# Patient Record
Sex: Female | Born: 1950
Health system: Southern US, Community
[De-identification: ages and names within clinical notes are randomized; demographics above are authoritative.]

## PROBLEM LIST (undated history)

## (undated) DIAGNOSIS — F329 Major depressive disorder, single episode, unspecified: Secondary | ICD-10-CM

## (undated) DIAGNOSIS — E039 Hypothyroidism, unspecified: Secondary | ICD-10-CM

## (undated) DIAGNOSIS — I219 Acute myocardial infarction, unspecified: Secondary | ICD-10-CM

## (undated) DIAGNOSIS — K209 Esophagitis, unspecified without bleeding: Secondary | ICD-10-CM

## (undated) DIAGNOSIS — K279 Peptic ulcer, site unspecified, unspecified as acute or chronic, without hemorrhage or perforation: Secondary | ICD-10-CM

## (undated) DIAGNOSIS — Z86718 Personal history of other venous thrombosis and embolism: Secondary | ICD-10-CM

## (undated) DIAGNOSIS — F39 Unspecified mood [affective] disorder: Secondary | ICD-10-CM

## (undated) DIAGNOSIS — E0500 Thyrotoxicosis with diffuse goiter without thyrotoxic crisis or storm: Secondary | ICD-10-CM

## (undated) DIAGNOSIS — I872 Venous insufficiency (chronic) (peripheral): Secondary | ICD-10-CM

## (undated) DIAGNOSIS — K219 Gastro-esophageal reflux disease without esophagitis: Secondary | ICD-10-CM

## (undated) DIAGNOSIS — E559 Vitamin D deficiency, unspecified: Secondary | ICD-10-CM

## (undated) DIAGNOSIS — H269 Unspecified cataract: Secondary | ICD-10-CM

## (undated) DIAGNOSIS — G5600 Carpal tunnel syndrome, unspecified upper limb: Secondary | ICD-10-CM

## (undated) DIAGNOSIS — E079 Disorder of thyroid, unspecified: Secondary | ICD-10-CM

## (undated) DIAGNOSIS — F32A Depression, unspecified: Secondary | ICD-10-CM

## (undated) DIAGNOSIS — R7303 Prediabetes: Secondary | ICD-10-CM

## (undated) DIAGNOSIS — M1990 Unspecified osteoarthritis, unspecified site: Secondary | ICD-10-CM

## (undated) DIAGNOSIS — I10 Essential (primary) hypertension: Secondary | ICD-10-CM

## (undated) DIAGNOSIS — I639 Cerebral infarction, unspecified: Secondary | ICD-10-CM

## (undated) HISTORY — DX: Venous insufficiency (chronic) (peripheral): I87.2

## (undated) HISTORY — DX: Vitamin D deficiency, unspecified: E55.9

## (undated) HISTORY — DX: Major depressive disorder, single episode, unspecified: F32.9

## (undated) HISTORY — DX: Esophagitis, unspecified: K20.9

## (undated) HISTORY — DX: Esophagitis, unspecified without bleeding: K20.90

## (undated) HISTORY — DX: Unspecified cataract: H26.9

## (undated) HISTORY — DX: Unspecified mood (affective) disorder: F39

## (undated) HISTORY — DX: Peptic ulcer, site unspecified, unspecified as acute or chronic, without hemorrhage or perforation: K27.9

## (undated) HISTORY — DX: Depression, unspecified: F32.A

## (undated) HISTORY — DX: Personal history of other venous thrombosis and embolism: Z86.718

## (undated) HISTORY — DX: Thyrotoxicosis with diffuse goiter without thyrotoxic crisis or storm: E05.00

## (undated) HISTORY — DX: Hypothyroidism, unspecified: E03.9

## (undated) HISTORY — PX: TUBAL LIGATION: SHX77

## (undated) HISTORY — DX: Gastro-esophageal reflux disease without esophagitis: K21.9

## (undated) HISTORY — DX: Carpal tunnel syndrome, unspecified upper limb: G56.00

---

## 2005-07-18 ENCOUNTER — Ambulatory Visit: Payer: Self-pay | Admitting: Endocrinology

## 2005-10-08 ENCOUNTER — Ambulatory Visit: Payer: Self-pay | Admitting: Endocrinology

## 2005-11-07 ENCOUNTER — Ambulatory Visit: Payer: Self-pay | Admitting: Endocrinology

## 2005-12-05 ENCOUNTER — Ambulatory Visit: Payer: Self-pay | Admitting: Endocrinology

## 2006-01-24 ENCOUNTER — Ambulatory Visit: Payer: Self-pay | Admitting: Endocrinology

## 2006-03-26 ENCOUNTER — Ambulatory Visit: Payer: Self-pay | Admitting: Endocrinology

## 2006-07-25 ENCOUNTER — Ambulatory Visit: Payer: Self-pay | Admitting: Endocrinology

## 2006-11-26 ENCOUNTER — Encounter: Payer: Self-pay | Admitting: Endocrinology

## 2006-11-26 DIAGNOSIS — F329 Major depressive disorder, single episode, unspecified: Secondary | ICD-10-CM | POA: Insufficient documentation

## 2006-11-26 DIAGNOSIS — F3289 Other specified depressive episodes: Secondary | ICD-10-CM | POA: Insufficient documentation

## 2006-11-26 DIAGNOSIS — I252 Old myocardial infarction: Secondary | ICD-10-CM | POA: Insufficient documentation

## 2006-11-26 DIAGNOSIS — F411 Generalized anxiety disorder: Secondary | ICD-10-CM | POA: Insufficient documentation

## 2006-11-26 DIAGNOSIS — I10 Essential (primary) hypertension: Secondary | ICD-10-CM | POA: Insufficient documentation

## 2006-11-26 DIAGNOSIS — J309 Allergic rhinitis, unspecified: Secondary | ICD-10-CM | POA: Insufficient documentation

## 2007-01-28 ENCOUNTER — Ambulatory Visit: Payer: Self-pay | Admitting: Endocrinology

## 2007-01-28 ENCOUNTER — Encounter: Payer: Self-pay | Admitting: Endocrinology

## 2007-03-12 ENCOUNTER — Ambulatory Visit: Payer: Self-pay | Admitting: Endocrinology

## 2007-03-12 LAB — CONVERTED CEMR LAB
Free T4: 0.8 ng/dL (ref 0.6–1.6)
TSH: 0.71 microintl units/mL (ref 0.35–5.50)

## 2007-06-08 ENCOUNTER — Ambulatory Visit: Payer: Self-pay | Admitting: Endocrinology

## 2007-06-08 LAB — CONVERTED CEMR LAB: TSH: 0.22 microintl units/mL — ABNORMAL LOW (ref 0.35–5.50)

## 2007-06-17 ENCOUNTER — Encounter: Admission: RE | Admit: 2007-06-17 | Discharge: 2007-06-17 | Payer: Self-pay | Admitting: Endocrinology

## 2007-07-01 ENCOUNTER — Encounter: Admission: RE | Admit: 2007-07-01 | Discharge: 2007-07-01 | Payer: Self-pay | Admitting: Endocrinology

## 2007-07-30 ENCOUNTER — Telehealth (INDEPENDENT_AMBULATORY_CARE_PROVIDER_SITE_OTHER): Payer: Self-pay | Admitting: *Deleted

## 2007-09-07 ENCOUNTER — Ambulatory Visit: Payer: Self-pay | Admitting: Endocrinology

## 2007-10-14 ENCOUNTER — Ambulatory Visit: Payer: Self-pay | Admitting: Endocrinology

## 2007-10-14 LAB — CONVERTED CEMR LAB: Free T4: 1.4 ng/dL (ref 0.6–1.6)

## 2007-12-02 ENCOUNTER — Ambulatory Visit: Payer: Self-pay | Admitting: Endocrinology

## 2007-12-02 LAB — CONVERTED CEMR LAB: TSH: 0.14 microintl units/mL — ABNORMAL LOW (ref 0.35–5.50)

## 2007-12-08 ENCOUNTER — Encounter: Admission: RE | Admit: 2007-12-08 | Discharge: 2007-12-08 | Payer: Self-pay | Admitting: Endocrinology

## 2007-12-25 ENCOUNTER — Encounter (HOSPITAL_COMMUNITY): Admission: RE | Admit: 2007-12-25 | Discharge: 2008-03-02 | Payer: Self-pay | Admitting: Endocrinology

## 2008-05-02 ENCOUNTER — Ambulatory Visit: Payer: Self-pay | Admitting: Endocrinology

## 2008-05-02 DIAGNOSIS — E039 Hypothyroidism, unspecified: Secondary | ICD-10-CM | POA: Insufficient documentation

## 2008-05-02 LAB — CONVERTED CEMR LAB: TSH: 40.77 microintl units/mL — ABNORMAL HIGH (ref 0.35–5.50)

## 2008-05-05 ENCOUNTER — Telehealth: Payer: Self-pay | Admitting: Endocrinology

## 2009-02-14 ENCOUNTER — Emergency Department (HOSPITAL_COMMUNITY): Admission: EM | Admit: 2009-02-14 | Discharge: 2009-02-14 | Payer: Self-pay | Admitting: Emergency Medicine

## 2010-08-02 LAB — CBC
Hemoglobin: 14.2 g/dL (ref 12.0–15.0)
MCHC: 32.9 g/dL (ref 30.0–36.0)
RDW: 15.7 % — ABNORMAL HIGH (ref 11.5–15.5)

## 2010-08-02 LAB — URINE MICROSCOPIC-ADD ON

## 2010-08-02 LAB — URINALYSIS, ROUTINE W REFLEX MICROSCOPIC
Bilirubin Urine: NEGATIVE
Glucose, UA: NEGATIVE mg/dL
Ketones, ur: NEGATIVE mg/dL
Nitrite: NEGATIVE

## 2010-08-02 LAB — POCT I-STAT, CHEM 8
BUN: 8 mg/dL (ref 6–23)
Calcium, Ion: 1.01 mmol/L — ABNORMAL LOW (ref 1.12–1.32)
Chloride: 104 mEq/L (ref 96–112)
Creatinine, Ser: 0.9 mg/dL (ref 0.4–1.2)
Glucose, Bld: 124 mg/dL — ABNORMAL HIGH (ref 70–99)
Hemoglobin: 16.3 g/dL — ABNORMAL HIGH (ref 12.0–15.0)
TCO2: 25 mmol/L (ref 0–100)

## 2010-08-02 LAB — PROTIME-INR
INR: 3.2 — ABNORMAL HIGH (ref 0.00–1.49)
Prothrombin Time: 32.5 seconds — ABNORMAL HIGH (ref 11.6–15.2)

## 2010-09-14 NOTE — Consult Note (Signed)
[facility]                            ENDOCRINOLOGY CONSULTATION   NAME:Julson, Oluwatoni                       MRN:          [id number]  DATE:12/05/2005                            DOB:          01/02/1951    REASON FOR VISIT:  Followup thyroid.   HISTORY OF PRESENT ILLNESS:  A 60 year old woman with hyperthyroidism who  now takes PTU 50 mg twice daily.  I have reduced it again at her last visit  because her TSH was again elevated.  She states she is feeling better in  general, except she has a few days of a sore throat.   PAST MEDICAL HISTORY:  Same as October 08, 2005.   REVIEW OF SYSTEMS:  She denies fever.  She has lost several more pounds  since her last visit.   PHYSICAL EXAMINATION:  VITAL SIGNS:  Blood pressure 137/78, heart rate 72,  temperature  98.3.  The weight is 260.  GENERAL:  No distress.  SKIN:  Normal texture and temperature.  HEENT:  No proptosis.  No periorbital swelling. Pharynx no erythema.  NECK:  The thyroid is minimally and diffusely enlarged.  NEUROLOGICAL:  Alert, well oriented.  Does not appear anxious nor depressed  and has no tremor.   LABORATORY STUDIES:  On December 05, 2005:  CBC is normal.  TSH is 8.2 which is  about half of the number she had at her last visit.   IMPRESSION:  1. Sore throat which in view of her normal CBC is not a side effect of the      PTU.  2. Hyperthyroidism.  She was over-controlled at her last visit, but her      TSH is now much closer to normal.   PLAN:  1. Continue PTU 50 mg twice a day.  2. Return in about a month.                                  Sean A. Everardo All, MD   SAE/MedQ  DD:  12/08/2005  DT:  12/09/2005  Job #:  [id number]   cc:   Raynelle Jan, MD

## 2010-09-14 NOTE — Consult Note (Signed)
[facility]                          ENDOCRINOLOGY CONSULTATION   NAME:Minniefield, Maddie                       MRN:          [id number]  DATE:03/26/2006                            DOB:          [birth year]    REASON FOR VISIT:  Followup thyroid.   HISTORY OF PRESENT ILLNESS:  A 60 year old woman who currently takes PTU  50 mg twice daily and states she is feeling well in general.   PAST MEDICAL HISTORY:  Same as January 25, 2006.   REVIEW OF SYSTEMS:  Denies any change in her weight.   PHYSICAL EXAMINATION:  VITAL SIGNS:  Blood pressure 125/80, heart rate  65, temperature 97.6, the weight is 253.  GENERAL:  No distress. She is overweight.  NECK:  The thyroid is normal.  SKIN:  Normal texture and temperature.  NEUROLOGIC:  Alert, well-oriented, does not appear anxious nor depressed  and there is no tremor.   LABORATORY DATA:  On March 26, 2006, TSH 1.42.   IMPRESSION:  Well-controlled hyperthyroidism.   PLAN:  1. Same amount of PTU.  2. Return in four months.     Sean A. Everardo All, MD  Electronically Signed    SAE/MedQ  DD: 03/30/2006  DT: 03/31/2006  Job #: [id number]   cc:   Raynelle Jan, M.D.

## 2010-09-14 NOTE — Consult Note (Signed)
[facility]                            ENDOCRINOLOGY CONSULTATION   NAME:[name], [name]                       MRN:          [id number]  DATE:01/25/2006                            DOB:          1950-11-29    REASON FOR CONSULTATION:  Follow-up thyroid.   HISTORY OF PRESENT ILLNESS:  The patient is currently taking PTU 50 mg twice  a day and states she feels no different and well in general.   PAST MEDICAL HISTORY:  1. Hypertension.  2. Depression/anxiety.  3. Allergic rhinitis.  4. GERD.  5. Myocardial infarction 04/2005.  6. Dyslipidemia.   REVIEW OF SYSTEMS:  She denies fevers, she denies any change in weight.   PHYSICAL EXAMINATION:  VITALS:  Blood pressure is 125/74, heart rate 62,  temperature is 97.4, the weight is 261.  GENERAL:  No distress.  The thyroid is minimally and diffusely enlarged.  SKIN:  Normal texture and temperature.  NEUROLOGIC:  Alert and oriented, does not appear anxious nor depressed.   LABORATORY DATA:  On 01/24/06 TSH is normal at 3.4.   IMPRESSION:  Well controlled hyperthyroidism.   PLAN:  1. Same amount of PTU.  2. Return in 2 months.            ______________________________  Cleophas Dunker. Everardo All, MD    SAE/MedQ  DD:  01/26/2006  DT:  01/27/2006  Job #:  [id number]   cc:   Raynelle Jan, M.D.

## 2010-09-14 NOTE — Consult Note (Signed)
[facility]                          ENDOCRINOLOGY CONSULTATION   NAME:[name], [name]                       MRN:          [id number]  DATE:07/25/2006                            DOB:          [birth year]    REASON FOR VISIT:  Follow up thyroid.   HISTORY OF PRESENT ILLNESS:  A 60 year old woman who takes PTU 100 mg  twice a day rather than the 50 mg twice a day as prescribed.  She states  she feels well in general.   PAST MEDICAL HISTORY:  1. Dyslipidemia.  2. Myocardial infarction.  3. GERD.  4. Allergic rhinitis.  5. Depression/anxiety.  6. Hypertension.   REVIEW OF SYSTEMS:  Denies fever.   PHYSICAL EXAMINATION:  VITAL SIGNS:  Blood pressure 119/72, heart rate  67, temperature 97.8, weight is 266.  GENERAL:  In no distress.  NECK:  Thyroid normal to my examination.  SKIN:  Not diaphoretic.  NEUROLOGIC:  No tremor.   LABORATORY STUDIES:  On July 25, 2006, TSH of 4.42.   IMPRESSION:  Although the TSH was normal, it will most likely go above  normal if this dosage of PTU is continued.   PLAN:  1. I left the patient a message advising her to reduce the PTU back to      50 mg twice a day.  2. Return in 6 months.     Sean A. Everardo All, MD  Electronically Signed    SAE/MedQ  DD: 07/28/2006  DT: 07/29/2006  Job #: [id number]   cc:   Raynelle Jan, M.D.

## 2014-06-19 ENCOUNTER — Emergency Department (HOSPITAL_COMMUNITY): Payer: Medicare Other

## 2014-06-19 ENCOUNTER — Inpatient Hospital Stay (HOSPITAL_COMMUNITY)
Admission: EM | Admit: 2014-06-19 | Discharge: 2014-06-21 | DRG: 193 | Disposition: A | Discharge status: Home / Self Care | Payer: Medicare Other | Attending: Internal Medicine | Admitting: Internal Medicine

## 2014-06-19 ENCOUNTER — Encounter (HOSPITAL_COMMUNITY): Payer: Self-pay | Admitting: *Deleted

## 2014-06-19 DIAGNOSIS — J188 Other pneumonia, unspecified organism: Secondary | ICD-10-CM | POA: Diagnosis not present

## 2014-06-19 DIAGNOSIS — J189 Pneumonia, unspecified organism: Secondary | ICD-10-CM | POA: Diagnosis present

## 2014-06-19 DIAGNOSIS — E871 Hypo-osmolality and hyponatremia: Secondary | ICD-10-CM | POA: Diagnosis not present

## 2014-06-19 DIAGNOSIS — R0602 Shortness of breath: Secondary | ICD-10-CM | POA: Diagnosis not present

## 2014-06-19 DIAGNOSIS — I2510 Atherosclerotic heart disease of native coronary artery without angina pectoris: Secondary | ICD-10-CM | POA: Diagnosis present

## 2014-06-19 DIAGNOSIS — J09X1 Influenza due to identified novel influenza A virus with pneumonia: Secondary | ICD-10-CM | POA: Diagnosis not present

## 2014-06-19 DIAGNOSIS — Z9981 Dependence on supplemental oxygen: Secondary | ICD-10-CM | POA: Diagnosis not present

## 2014-06-19 DIAGNOSIS — I252 Old myocardial infarction: Secondary | ICD-10-CM

## 2014-06-19 DIAGNOSIS — Z8673 Personal history of transient ischemic attack (TIA), and cerebral infarction without residual deficits: Secondary | ICD-10-CM | POA: Diagnosis not present

## 2014-06-19 DIAGNOSIS — Z79899 Other long term (current) drug therapy: Secondary | ICD-10-CM | POA: Diagnosis not present

## 2014-06-19 DIAGNOSIS — J9601 Acute respiratory failure with hypoxia: Secondary | ICD-10-CM | POA: Diagnosis not present

## 2014-06-19 DIAGNOSIS — J209 Acute bronchitis, unspecified: Secondary | ICD-10-CM | POA: Diagnosis not present

## 2014-06-19 DIAGNOSIS — J9811 Atelectasis: Secondary | ICD-10-CM | POA: Diagnosis present

## 2014-06-19 DIAGNOSIS — Z882 Allergy status to sulfonamides status: Secondary | ICD-10-CM | POA: Diagnosis not present

## 2014-06-19 DIAGNOSIS — E039 Hypothyroidism, unspecified: Secondary | ICD-10-CM | POA: Diagnosis not present

## 2014-06-19 DIAGNOSIS — Z9911 Dependence on respirator [ventilator] status: Secondary | ICD-10-CM | POA: Diagnosis not present

## 2014-06-19 DIAGNOSIS — E876 Hypokalemia: Secondary | ICD-10-CM | POA: Diagnosis present

## 2014-06-19 DIAGNOSIS — R6510 Systemic inflammatory response syndrome (SIRS) of non-infectious origin without acute organ dysfunction: Secondary | ICD-10-CM | POA: Diagnosis present

## 2014-06-19 DIAGNOSIS — D6959 Other secondary thrombocytopenia: Secondary | ICD-10-CM | POA: Diagnosis present

## 2014-06-19 DIAGNOSIS — N179 Acute kidney failure, unspecified: Secondary | ICD-10-CM | POA: Diagnosis not present

## 2014-06-19 DIAGNOSIS — J9600 Acute respiratory failure, unspecified whether with hypoxia or hypercapnia: Secondary | ICD-10-CM

## 2014-06-19 DIAGNOSIS — R509 Fever, unspecified: Secondary | ICD-10-CM | POA: Diagnosis not present

## 2014-06-19 DIAGNOSIS — R05 Cough: Secondary | ICD-10-CM | POA: Diagnosis not present

## 2014-06-19 DIAGNOSIS — I10 Essential (primary) hypertension: Secondary | ICD-10-CM | POA: Diagnosis not present

## 2014-06-19 HISTORY — DX: Disorder of thyroid, unspecified: E07.9

## 2014-06-19 HISTORY — DX: Acute myocardial infarction, unspecified: I21.9

## 2014-06-19 HISTORY — DX: Essential (primary) hypertension: I10

## 2014-06-19 HISTORY — DX: Cerebral infarction, unspecified: I63.9

## 2014-06-19 LAB — CBC WITH DIFFERENTIAL/PLATELET
BASOS ABS: 0 10*3/uL (ref 0.0–0.1)
BASOS PCT: 0 % (ref 0–1)
Eosinophils Absolute: 0 10*3/uL (ref 0.0–0.7)
Eosinophils Relative: 0 % (ref 0–5)
HCT: 35.1 % — ABNORMAL LOW (ref 36.0–46.0)
Hemoglobin: 11.4 g/dL — ABNORMAL LOW (ref 12.0–15.0)
Lymphocytes Relative: 9 % — ABNORMAL LOW (ref 12–46)
Lymphs Abs: 0.6 10*3/uL — ABNORMAL LOW (ref 0.7–4.0)
MCH: 25.9 pg — AB (ref 26.0–34.0)
MCHC: 32.5 g/dL (ref 30.0–36.0)
MCV: 79.8 fL (ref 78.0–100.0)
Monocytes Absolute: 0.6 10*3/uL (ref 0.1–1.0)
Monocytes Relative: 9 % (ref 3–12)
NEUTROS ABS: 5 10*3/uL (ref 1.7–7.7)
Neutrophils Relative %: 82 % — ABNORMAL HIGH (ref 43–77)
PLATELETS: 136 10*3/uL — AB (ref 150–400)
RBC: 4.4 MIL/uL (ref 3.87–5.11)
RDW: 15.6 % — AB (ref 11.5–15.5)
WBC: 6.2 10*3/uL (ref 4.0–10.5)

## 2014-06-19 LAB — COMPREHENSIVE METABOLIC PANEL
ALK PHOS: 113 U/L (ref 39–117)
ALT: 27 U/L (ref 0–35)
ANION GAP: 12 (ref 5–15)
AST: 32 U/L (ref 0–37)
Albumin: 3.2 g/dL — ABNORMAL LOW (ref 3.5–5.2)
BILIRUBIN TOTAL: 0.9 mg/dL (ref 0.3–1.2)
BUN: 10 mg/dL (ref 6–23)
CALCIUM: 8.3 mg/dL — AB (ref 8.4–10.5)
CO2: 28 mmol/L (ref 19–32)
CREATININE: 1.16 mg/dL — AB (ref 0.50–1.10)
Chloride: 92 mmol/L — ABNORMAL LOW (ref 96–112)
GFR calc Af Amer: 56 mL/min — ABNORMAL LOW (ref >90)
GFR, EST NON AFRICAN AMERICAN: 49 mL/min — AB (ref >90)
GLUCOSE: 121 mg/dL — AB (ref 70–99)
Potassium: 2.8 mmol/L — ABNORMAL LOW (ref 3.5–5.1)
SODIUM: 132 mmol/L — AB (ref 135–145)
TOTAL PROTEIN: 7.4 g/dL (ref 6.0–8.3)

## 2014-06-19 MED ORDER — ACETAMINOPHEN 325 MG PO TABS
650.0000 mg | ORAL_TABLET | Freq: Four times a day (QID) | ORAL | Status: DC | PRN
  Administered 2014-06-19: 650 mg via ORAL
  Filled 2014-06-19: qty 2

## 2014-06-19 NOTE — ED Notes (Signed)
The opt has had a cough and a coldf for one week  And the cold started back again yesterdaty.  Having trouble sleeping from coughing.  Having cold chills and being hot.  Non-poroductive cough

## 2014-06-19 NOTE — ED Notes (Signed)
Patient transported to X-ray 

## 2014-06-20 ENCOUNTER — Other Ambulatory Visit (HOSPITAL_COMMUNITY): Payer: Self-pay

## 2014-06-20 ENCOUNTER — Encounter (HOSPITAL_COMMUNITY): Payer: Self-pay | Admitting: Radiology

## 2014-06-20 ENCOUNTER — Other Ambulatory Visit (HOSPITAL_COMMUNITY): Payer: Medicare Other

## 2014-06-20 ENCOUNTER — Emergency Department (HOSPITAL_COMMUNITY): Payer: Medicare Other

## 2014-06-20 DIAGNOSIS — J189 Pneumonia, unspecified organism: Secondary | ICD-10-CM | POA: Diagnosis not present

## 2014-06-20 DIAGNOSIS — Z882 Allergy status to sulfonamides status: Secondary | ICD-10-CM | POA: Diagnosis not present

## 2014-06-20 DIAGNOSIS — I10 Essential (primary) hypertension: Secondary | ICD-10-CM | POA: Diagnosis not present

## 2014-06-20 DIAGNOSIS — R6510 Systemic inflammatory response syndrome (SIRS) of non-infectious origin without acute organ dysfunction: Secondary | ICD-10-CM | POA: Diagnosis present

## 2014-06-20 DIAGNOSIS — I252 Old myocardial infarction: Secondary | ICD-10-CM | POA: Diagnosis not present

## 2014-06-20 DIAGNOSIS — R05 Cough: Secondary | ICD-10-CM | POA: Diagnosis not present

## 2014-06-20 DIAGNOSIS — J9601 Acute respiratory failure with hypoxia: Secondary | ICD-10-CM | POA: Diagnosis present

## 2014-06-20 DIAGNOSIS — E871 Hypo-osmolality and hyponatremia: Secondary | ICD-10-CM | POA: Diagnosis present

## 2014-06-20 DIAGNOSIS — E876 Hypokalemia: Secondary | ICD-10-CM | POA: Diagnosis not present

## 2014-06-20 DIAGNOSIS — J9811 Atelectasis: Secondary | ICD-10-CM | POA: Diagnosis present

## 2014-06-20 DIAGNOSIS — J188 Other pneumonia, unspecified organism: Secondary | ICD-10-CM | POA: Diagnosis present

## 2014-06-20 DIAGNOSIS — Z8673 Personal history of transient ischemic attack (TIA), and cerebral infarction without residual deficits: Secondary | ICD-10-CM | POA: Diagnosis not present

## 2014-06-20 DIAGNOSIS — J209 Acute bronchitis, unspecified: Secondary | ICD-10-CM | POA: Diagnosis present

## 2014-06-20 DIAGNOSIS — A419 Sepsis, unspecified organism: Secondary | ICD-10-CM | POA: Diagnosis not present

## 2014-06-20 DIAGNOSIS — D6959 Other secondary thrombocytopenia: Secondary | ICD-10-CM | POA: Diagnosis present

## 2014-06-20 DIAGNOSIS — E038 Other specified hypothyroidism: Secondary | ICD-10-CM

## 2014-06-20 DIAGNOSIS — I2510 Atherosclerotic heart disease of native coronary artery without angina pectoris: Secondary | ICD-10-CM | POA: Diagnosis present

## 2014-06-20 DIAGNOSIS — J09X1 Influenza due to identified novel influenza A virus with pneumonia: Secondary | ICD-10-CM | POA: Diagnosis present

## 2014-06-20 DIAGNOSIS — J9600 Acute respiratory failure, unspecified whether with hypoxia or hypercapnia: Secondary | ICD-10-CM

## 2014-06-20 DIAGNOSIS — Z79899 Other long term (current) drug therapy: Secondary | ICD-10-CM | POA: Diagnosis not present

## 2014-06-20 DIAGNOSIS — N179 Acute kidney failure, unspecified: Secondary | ICD-10-CM | POA: Diagnosis present

## 2014-06-20 DIAGNOSIS — E039 Hypothyroidism, unspecified: Secondary | ICD-10-CM | POA: Diagnosis present

## 2014-06-20 DIAGNOSIS — Z9981 Dependence on supplemental oxygen: Secondary | ICD-10-CM | POA: Diagnosis not present

## 2014-06-20 DIAGNOSIS — R0602 Shortness of breath: Secondary | ICD-10-CM | POA: Diagnosis not present

## 2014-06-20 LAB — COMPREHENSIVE METABOLIC PANEL
ALT: 27 U/L (ref 0–35)
ANION GAP: 13 (ref 5–15)
AST: 36 U/L (ref 0–37)
Albumin: 2.9 g/dL — ABNORMAL LOW (ref 3.5–5.2)
Alkaline Phosphatase: 109 U/L (ref 39–117)
BILIRUBIN TOTAL: 0.5 mg/dL (ref 0.3–1.2)
BUN: 7 mg/dL (ref 6–23)
CHLORIDE: 92 mmol/L — AB (ref 96–112)
CO2: 30 mmol/L (ref 19–32)
Calcium: 8.5 mg/dL (ref 8.4–10.5)
Creatinine, Ser: 1.1 mg/dL (ref 0.50–1.10)
GFR calc Af Amer: 60 mL/min — ABNORMAL LOW (ref >90)
GFR, EST NON AFRICAN AMERICAN: 52 mL/min — AB (ref >90)
Glucose, Bld: 127 mg/dL — ABNORMAL HIGH (ref 70–99)
POTASSIUM: 2.7 mmol/L — AB (ref 3.5–5.1)
SODIUM: 135 mmol/L (ref 135–145)
Total Protein: 7.4 g/dL (ref 6.0–8.3)

## 2014-06-20 LAB — URINALYSIS, ROUTINE W REFLEX MICROSCOPIC
BILIRUBIN URINE: NEGATIVE
Glucose, UA: NEGATIVE mg/dL
Hgb urine dipstick: NEGATIVE
KETONES UR: NEGATIVE mg/dL
Nitrite: NEGATIVE
PROTEIN: NEGATIVE mg/dL
Specific Gravity, Urine: 1.04 — ABNORMAL HIGH (ref 1.005–1.030)
Urobilinogen, UA: 1 mg/dL (ref 0.0–1.0)
pH: 6 (ref 5.0–8.0)

## 2014-06-20 LAB — MAGNESIUM: MAGNESIUM: 1.7 mg/dL (ref 1.5–2.5)

## 2014-06-20 LAB — I-STAT TROPONIN, ED: TROPONIN I, POC: 0.05 ng/mL (ref 0.00–0.08)

## 2014-06-20 LAB — URINE MICROSCOPIC-ADD ON

## 2014-06-20 MED ORDER — SODIUM CHLORIDE 0.9 % IV SOLN
INTRAVENOUS | Status: DC
  Administered 2014-06-20 – 2014-06-21 (×2): via INTRAVENOUS
  Filled 2014-06-20 (×5): qty 1000

## 2014-06-20 MED ORDER — POTASSIUM CHLORIDE CRYS ER 20 MEQ PO TBCR: 40.0000 meq | EXTENDED_RELEASE_TABLET | Freq: Every day | ORAL | Status: DC

## 2014-06-20 MED ORDER — CALCIUM CARBONATE 1250 (500 CA) MG PO TABS
1.0000 | ORAL_TABLET | Freq: Every day | ORAL | Status: DC
  Administered 2014-06-21: 500 mg via ORAL
  Filled 2014-06-20 (×3): qty 1

## 2014-06-20 MED ORDER — CALCIUM 600 MG PO TABS: 1500.0000 mg | ORAL_TABLET | Freq: Every day | ORAL | Status: DC

## 2014-06-20 MED ORDER — DEXTROSE 5 % IV SOLN
500.0000 mg | INTRAVENOUS | Status: DC
  Administered 2014-06-21: 500 mg via INTRAVENOUS
  Filled 2014-06-20 (×2): qty 500

## 2014-06-20 MED ORDER — POTASSIUM CHLORIDE CRYS ER 20 MEQ PO TBCR
40.0000 meq | EXTENDED_RELEASE_TABLET | Freq: Three times a day (TID) | ORAL | Status: AC
  Administered 2014-06-20 (×2): 40 meq via ORAL
  Filled 2014-06-20 (×3): qty 2

## 2014-06-20 MED ORDER — OMEGA-3-ACID ETHYL ESTERS 1 G PO CAPS
4.0000 g | ORAL_CAPSULE | Freq: Every day | ORAL | Status: DC
  Administered 2014-06-20 – 2014-06-21 (×2): 4 g via ORAL
  Filled 2014-06-20 (×2): qty 4

## 2014-06-20 MED ORDER — ACETAMINOPHEN 325 MG PO TABS: 650.0000 mg | ORAL_TABLET | Freq: Four times a day (QID) | ORAL | Status: DC | PRN

## 2014-06-20 MED ORDER — DEXTROSE 5 % IV SOLN
500.0000 mg | Freq: Once | INTRAVENOUS | Status: AC
  Administered 2014-06-20: 500 mg via INTRAVENOUS
  Filled 2014-06-20: qty 500

## 2014-06-20 MED ORDER — ENOXAPARIN SODIUM 40 MG/0.4ML ~~LOC~~ SOLN
40.0000 mg | SUBCUTANEOUS | Status: DC
  Administered 2014-06-20 – 2014-06-21 (×2): 40 mg via SUBCUTANEOUS
  Filled 2014-06-20 (×2): qty 0.4

## 2014-06-20 MED ORDER — PANTOPRAZOLE SODIUM 40 MG PO TBEC
40.0000 mg | DELAYED_RELEASE_TABLET | Freq: Every day | ORAL | Status: DC
  Administered 2014-06-20 – 2014-06-21 (×2): 40 mg via ORAL
  Filled 2014-06-20 (×2): qty 1

## 2014-06-20 MED ORDER — IOHEXOL 350 MG/ML SOLN
75.0000 mL | Freq: Once | INTRAVENOUS | Status: AC | PRN
  Administered 2014-06-20: 75 mL via INTRAVENOUS

## 2014-06-20 MED ORDER — VITAMIN B-12 1000 MCG PO TABS
2000.0000 ug | ORAL_TABLET | Freq: Every day | ORAL | Status: DC
  Administered 2014-06-20 – 2014-06-21 (×2): 2000 ug via ORAL
  Filled 2014-06-20 (×2): qty 2

## 2014-06-20 MED ORDER — VITAMIN B-12 100 MCG PO TABS: 2000000.0000 ug | ORAL_TABLET | Freq: Every day | ORAL | Status: DC

## 2014-06-20 MED ORDER — HYDROMORPHONE HCL 1 MG/ML IJ SOLN: 0.5000 mg | INTRAMUSCULAR | Status: DC | PRN

## 2014-06-20 MED ORDER — LEVOTHYROXINE SODIUM 112 MCG PO TABS
112.0000 ug | ORAL_TABLET | Freq: Every day | ORAL | Status: DC
  Administered 2014-06-20 – 2014-06-21 (×2): 112 ug via ORAL
  Filled 2014-06-20 (×4): qty 1

## 2014-06-20 MED ORDER — ALBUTEROL SULFATE (2.5 MG/3ML) 0.083% IN NEBU: 2.5000 mg | INHALATION_SOLUTION | RESPIRATORY_TRACT | Status: DC | PRN

## 2014-06-20 MED ORDER — ONDANSETRON HCL 4 MG/2ML IJ SOLN: 4.0000 mg | Freq: Four times a day (QID) | INTRAMUSCULAR | Status: DC | PRN

## 2014-06-20 MED ORDER — CEFTRIAXONE SODIUM IN DEXTROSE 20 MG/ML IV SOLN
1.0000 g | INTRAVENOUS | Status: DC
  Administered 2014-06-21: 1 g via INTRAVENOUS
  Filled 2014-06-20 (×2): qty 50

## 2014-06-20 MED ORDER — ALUM & MAG HYDROXIDE-SIMETH 200-200-20 MG/5ML PO SUSP: 30.0000 mL | Freq: Four times a day (QID) | ORAL | Status: DC | PRN

## 2014-06-20 MED ORDER — SODIUM CHLORIDE 0.9 % IV SOLN: INTRAVENOUS | Status: DC

## 2014-06-20 MED ORDER — OXYCODONE HCL 5 MG PO TABS: 5.0000 mg | ORAL_TABLET | ORAL | Status: DC | PRN

## 2014-06-20 MED ORDER — ALBUTEROL SULFATE (2.5 MG/3ML) 0.083% IN NEBU
2.5000 mg | INHALATION_SOLUTION | Freq: Four times a day (QID) | RESPIRATORY_TRACT | Status: DC
  Administered 2014-06-20 – 2014-06-21 (×6): 2.5 mg via RESPIRATORY_TRACT
  Filled 2014-06-20 (×6): qty 3

## 2014-06-20 MED ORDER — ONDANSETRON HCL 4 MG PO TABS: 4.0000 mg | ORAL_TABLET | Freq: Four times a day (QID) | ORAL | Status: DC | PRN

## 2014-06-20 MED ORDER — ACETAMINOPHEN 650 MG RE SUPP: 650.0000 mg | Freq: Four times a day (QID) | RECTAL | Status: DC | PRN

## 2014-06-20 MED ORDER — FLUTICASONE PROPIONATE 50 MCG/ACT NA SUSP
2.0000 | Freq: Every day | NASAL | Status: DC | PRN
  Filled 2014-06-20: qty 16

## 2014-06-20 MED ORDER — DEXTROSE 5 % IV SOLN
1.0000 g | Freq: Once | INTRAVENOUS | Status: AC
  Administered 2014-06-20: 1 g via INTRAVENOUS
  Filled 2014-06-20: qty 10

## 2014-06-20 MED ORDER — VITAMIN D3 25 MCG (1000 UNIT) PO TABS
2000.0000 [IU] | ORAL_TABLET | Freq: Every day | ORAL | Status: DC
  Administered 2014-06-20 – 2014-06-21 (×2): 2000 [IU] via ORAL
  Filled 2014-06-20 (×2): qty 2

## 2014-06-20 MED ORDER — DOCUSATE SODIUM 100 MG PO CAPS: 200.0000 mg | ORAL_CAPSULE | Freq: Two times a day (BID) | ORAL | Status: DC | PRN

## 2014-06-20 MED ORDER — ONDANSETRON HCL 4 MG/2ML IJ SOLN: 4.0000 mg | Freq: Three times a day (TID) | INTRAMUSCULAR | Status: DC | PRN

## 2014-06-20 NOTE — Progress Notes (Signed)
Paged Dr. Allyson Sabal KCL 2.7 Juluis Rainier

## 2014-06-20 NOTE — Progress Notes (Signed)
Patient seen and examined Stable overnight, hemodynamically stable, currently on 2 L of oxygen afebrile   Reviewed results of CT angiogram, no evidence of pulmonary embolism.  Assessment and plan Likely viral bronchitis-continue empiric antibiotics, oxygen, albuterol nebs, being oxygen  Hypertension Hold Lasix and Hyzaar  Hypokalemia Repleted, check magnesium  Acute kidney injury/hyponatremia improving with IV hydration

## 2014-06-20 NOTE — ED Provider Notes (Signed)
CSN: [id number]     Arrival date & time 06/19/14  2149 History   First MD Initiated Contact with Patient 06/19/14 2309     Chief Complaint  Patient presents with  . Cough     (Consider location/radiation/quality/duration/timing/severity/associated sxs/prior Treatment) HPI Comments: Patient is a 64 year old female with a past medical history of hypertension and previous MI who presents with SOB for the past few months which has worsened over the past week. Symptoms started gradually and progressively worsened since the onset. Patient also has a non productive cough for the past week and a subjective fever that started today. She has not tried anything at home for the symptoms. Patient reports having trouble walking around her house due to her severe SOB. She does not wear oxygen at home and denies previous DVT/PE, recent surgery/hospitalization/immobilization, or exogenous estrogen. No other associated symptoms including leg swelling and chest pain. No known sick contacts.    Past Medical History  Diagnosis Date  . Stroke   . Thyroid disease   . Hypertension   . MI (myocardial infarction)    No past surgical history on file. No family history on file. History  Substance Use Topics  . Smoking status: Never Smoker   . Smokeless tobacco: Not on file  . Alcohol Use: No   OB History    No data available     Review of Systems  Constitutional: Positive for fever. Negative for chills and fatigue.  HENT: Negative for trouble swallowing.   Eyes: Negative for visual disturbance.  Respiratory: Positive for cough and shortness of breath.   Cardiovascular: Negative for chest pain and palpitations.  Gastrointestinal: Negative for nausea, vomiting, abdominal pain and diarrhea.  Genitourinary: Negative for dysuria and difficulty urinating.  Musculoskeletal: Negative for arthralgias and neck pain.  Skin: Negative for color change.  Neurological: Negative for dizziness and weakness.   Psychiatric/Behavioral: Negative for dysphoric mood.      Allergies  Sulfonamide derivatives  Home Medications   Prior to Admission medications   Not on File   BP 122/52 mmHg  Pulse 97  Temp(Src) 103.2 F (39.6 C) (Rectal)  Resp 27  Ht 5\' 10"  (1.778 m)  Wt 240 lb (108.863 kg)  BMI 34.44 kg/m2  SpO2 91% Physical Exam  Constitutional: She is oriented to person, place, and time. She appears well-developed and well-nourished. No distress.  HENT:  Head: Normocephalic and atraumatic.  Eyes: Conjunctivae and EOM are normal.  Neck: Normal range of motion.  Cardiovascular: Normal rate and regular rhythm.  Exam reveals no gallop and no friction rub.   No murmur heard. Pulmonary/Chest: Effort normal. She has no wheezes. She has no rales. She exhibits no tenderness.  Crackles noted in the RLL.   Abdominal: Soft. There is no tenderness.  Musculoskeletal: Normal range of motion.  Neurological: She is alert and oriented to person, place, and time. Coordination normal.  Speech is goal-oriented. Moves limbs without ataxia.   Skin: Skin is warm and dry.  Psychiatric: She has a normal mood and affect. Her behavior is normal.  Nursing note and vitals reviewed.   ED Course  Procedures (including critical care time) Labs Review Labs Reviewed  CBC WITH DIFFERENTIAL/PLATELET - Abnormal; Notable for the following:    Hemoglobin 11.4 (*)    HCT 35.1 (*)    MCH 25.9 (*)    RDW 15.6 (*)    Platelets 136 (*)    Neutrophils Relative % 82 (*)    Lymphocytes Relative  9 (*)    Lymphs Abs 0.6 (*)    All other components within normal limits  COMPREHENSIVE METABOLIC PANEL - Abnormal; Notable for the following:    Sodium 132 (*)    Potassium 2.8 (*)    Chloride 92 (*)    Glucose, Bld 121 (*)    Creatinine, Ser 1.16 (*)    Calcium 8.3 (*)    Albumin 3.2 (*)    GFR calc non Af Amer 49 (*)    GFR calc Af Amer 56 (*)    All other components within normal limits  URINALYSIS, ROUTINE W  REFLEX MICROSCOPIC  I-STAT TROPOININ, ED    Imaging Review Dg Chest 2 View  06/19/2014   CLINICAL DATA:  Cough for a few weeks.  Shortness of breath.  Fever.  EXAM: CHEST  2 VIEW  COMPARISON:  02/12/2011, 09/18/2010  FINDINGS: Compared to prior exam there is progressive eventration of right hemidiaphragm. Minimal adjacent atelectasis, likely compressive. There is no consolidation to suggest pneumonia. Heart size and mediastinal contours are normal, mild atherosclerosis of the aortic arch. There is no pleural effusion or pneumothorax. Pulmonary vasculature is normal. No acute osseous abnormalities are seen.  IMPRESSION: Progressive eventration of right hemidiaphragm with adjacent compressive atelectasis. The lungs are otherwise clear.   Electronically Signed   By: Jeb Levering M.D.   On: 06/19/2014 23:11    Date: 06/20/2014  Rate: 81  Rhythm: normal sinus rhythm  QRS Axis: normal  Intervals: normal  ST/T Wave abnormalities: normal  Conduction Disutrbances:right bundle branch block  Narrative Interpretation: NSR without acute changes  Old EKG Reviewed: none available     EKG Interpretation None      MDM   Final diagnoses:  SOB (shortness of breath)  CAP (community acquired pneumonia)  Dependence on supplemental oxygen    12:32 AM Labs show hypokalemia. Chest xray shows progression of right hemidiaphragm and compressive atelectasis. Patient hypoxic at rest at 87%. Patient placed on 2L oxygen. Patient febrile at 103.2 and given tylenol.   2:23 AM CT chest unremarkable for acute PE. Clinically, patient has CAP with oxygen dependence. Troponin negative and EKG shows no ischemic changes. Patient will be started on azithromycin and rocephin. Patient will be admitted.   [address] [place], PA-C 06/20/14 7341  Julianne Rice, MD 06/20/14 [phone]

## 2014-06-20 NOTE — ED Notes (Signed)
Dr. Arnoldo Morale at bedside with pt.

## 2014-06-20 NOTE — H&P (Addendum)
[facility] Admission History and Physical       [name] [name] QJJ:[id number] DOB: [birth year] DOA: 06/19/2014  Referring physician:  PCP: Renato Shin, MD  Specialists:   Chief Complaint: SOB and increased Cough  HPI: [name] [name] is a 64 y.o. female with history of CAD, HTN, Hypothyroid, and Previous CVA who presents to the ED with complaints of worsening SOB over the past 2-3 days and increased cough and chest congestion x 1 month.   She was hypoxic to 87% on arrival and was also found to have a fever to 103.2,   She was evaluated and found to have a clinical pneumonia and was placed on IV Rocephin and Azithromycin to cover CAP.   Her daughter had been sick with the flu 1 month ago and she  Has been sick ever since then     Review of Systems:  Constitutional: No Weight Loss, No Weight Gain, Night Sweats, Fevers, Chills, Dizziness, Light Headedness, Fatigue, or Generalized Weakness HEENT: No Headaches, Difficulty Swallowing,Tooth/Dental Problems,Sore Throat,  No Sneezing, Rhinitis, Ear Ache, Nasal Congestion, or Post Nasal Drip,  Cardio-vascular:  No Chest pain, Orthopnea, PND, Edema in Lower Extremities, Anasarca, Dizziness, Palpitations  Resp: No +Dyspnea, No DOE, +Non-Productive Cough, No Hemoptysis, No Wheezing.    GI: No Heartburn, Indigestion, Abdominal Pain, Nausea, Vomiting, Diarrhea, Constipation, Hematemesis, Hematochezia, Melena, Change in Bowel Habits,  Loss of Appetite  GU: No Dysuria, No Change in Color of Urine, No Urgency or Urinary Frequency, No Flank pain.  Musculoskeletal: No Joint Pain or Swelling, No Decreased Range of Motion, No Back Pain.  Neurologic: No Syncope, No Seizures, Muscle Weakness, Paresthesia, Vision Disturbance or Loss, No Diplopia, No Vertigo, No Difficulty Walking,  Skin: No Rash or Lesions. Psych: No Change in Mood or Affect, No Depression or Anxiety, No Memory loss, No Confusion, or Hallucinations   Past Medical History  Diagnosis  Date  . Stroke   . Thyroid disease   . Hypertension   . MI (myocardial infarction)      No past surgical history on file.    Prior to Admission medications   Medication Sig Start Date End Date Taking? Authorizing Provider  budesonide-formoterol (SYMBICORT) 160-4.5 MCG/ACT inhaler Inhale 2 puffs into the lungs 2 (two) times daily.   Yes Historical Provider, MD  CALCIUM PO Take 1 tablet by mouth daily.   Yes Historical Provider, MD  cholecalciferol (VITAMIN D) 1000 UNITS tablet Take 2,000 Units by mouth daily.   Yes Historical Provider, MD  Cyanocobalamin (VITAMIN B-12 PO) Take 2,000 mg by mouth daily.   Yes Historical Provider, MD  diphenhydramine-acetaminophen (TYLENOL PM) 25-500 MG TABS Take 2 tablets by mouth at bedtime as needed (pain/sleep).   Yes Historical Provider, MD  docusate sodium (COLACE) 100 MG capsule Take 200 mg by mouth 2 (two) times daily as needed for mild constipation.   Yes Historical Provider, MD  fluticasone (FLONASE) 50 MCG/ACT nasal spray Place 2 sprays into both nostrils daily as needed for allergies or rhinitis.   Yes Historical Provider, MD  furosemide (LASIX) 40 MG tablet Take 40 mg by mouth daily.   Yes Historical Provider, MD  levothyroxine (SYNTHROID, LEVOTHROID) 112 MCG tablet Take 112 mcg by mouth daily before breakfast.   Yes Historical Provider, MD  losartan-hydrochlorothiazide (HYZAAR) 100-25 MG per tablet Take 1 tablet by mouth daily.   Yes Historical Provider, MD  omega-3 acid ethyl esters (LOVAZA) 1 G capsule Take 4 g by mouth daily.   Yes Historical Provider, MD  omeprazole (PRILOSEC) 20 MG capsule Take 20 mg by mouth daily.   Yes Historical Provider, MD  potassium chloride SA (K-DUR,KLOR-CON) 20 MEQ tablet Take 40 mEq by mouth daily.   Yes Historical Provider, MD  PRESCRIPTION MEDICATION Take 1 tablet by mouth daily. Depression pill   Yes Historical Provider, MD     Allergies  Allergen Reactions  . Sulfonamide Derivatives Itching    Social  History:  reports that she has never smoked. She does not have any smokeless tobacco history on file. She reports that she does not drink alcohol. Her drug history is not on file.    No family history on file.     Physical Exam:  GEN:  Pleasant Obese 64 y.o. African American female examined and in no acute distress; cooperative with exam Filed Vitals:   06/20/14 0245 06/20/14 0300 06/20/14 0316 06/20/14 0425  BP: 129/62 124/63  114/59  Pulse: 76 73  75  Temp:   99 F (37.2 C) 99.8 F (37.7 C)  TempSrc:   Oral Oral  Resp: 22 23  20   Height:    5\' 10"  (1.778 m)  Weight:    119.069 kg (262 lb 8 oz)  SpO2: 97% 97%  99%   Blood pressure 114/59, pulse 75, temperature 99.8 F (37.7 C), temperature source Oral, resp. rate 20, height 5\' 10"  (1.778 m), weight 119.069 kg (262 lb 8 oz), SpO2 99 %. PSYCH: She is alert and oriented x4; does not appear anxious does not appear depressed; affect is normal HEENT: Normocephalic and Atraumatic, Mucous membranes pink; PERRLA; EOM intact; Fundi:  Benign;  No scleral icterus, Nares: Patent, Oropharynx: Clear,    Neck:  FROM, No Cervical Lymphadenopathy nor Thyromegaly or Carotid Bruit; No JVD; Breasts:: Not examined CHEST WALL: No tenderness CHEST: Normal respiration, clear to auscultation bilaterally HEART: Regular rate and rhythm; no murmurs rubs or gallops BACK: No kyphosis or scoliosis; No CVA tenderness ABDOMEN: Positive Bowel Sounds, Obese, Soft Non-Tender, No Rebound or Guarding; No Masses, No Organomegaly. Rectal Exam: Not done EXTREMITIES: No Cyanosis, Clubbing, or Edema; No Ulcerations. Genitalia: not examined PULSES: 2+ and symmetric SKIN: Normal hydration no rash or ulceration CNS:  Alert and Oriented x 4, No Focal Deficits Vascular: pulses palpable throughout    Labs on Admission:  Basic Metabolic Panel:  Recent Labs Lab 06/19/14 2246  NA 132*  K 2.8*  CL 92*  CO2 28  GLUCOSE 121*  BUN 10  CREATININE 1.16*  CALCIUM 8.3*    Liver Function Tests:  Recent Labs Lab 06/19/14 2246  AST 32  ALT 27  ALKPHOS 113  BILITOT 0.9  PROT 7.4  ALBUMIN 3.2*   No results for input(s): LIPASE, AMYLASE in the last 168 hours. No results for input(s): AMMONIA in the last 168 hours. CBC:  Recent Labs Lab 06/19/14 2246  WBC 6.2  NEUTROABS 5.0  HGB 11.4*  HCT 35.1*  MCV 79.8  PLT 136*   Cardiac Enzymes: No results for input(s): CKTOTAL, CKMB, CKMBINDEX, TROPONINI in the last 168 hours.  BNP (last 3 results) No results for input(s): BNP in the last 8760 hours.  ProBNP (last 3 results) No results for input(s): PROBNP in the last 8760 hours.  CBG: No results for input(s): GLUCAP in the last 168 hours.  Radiological Exams on Admission: Dg Chest 2 View  06/19/2014   CLINICAL DATA:  Cough for a few weeks.  Shortness of breath.  Fever.  EXAM: CHEST  2 VIEW  COMPARISON:  02/12/2011, 09/18/2010  FINDINGS: Compared to prior exam there is progressive eventration of right hemidiaphragm. Minimal adjacent atelectasis, likely compressive. There is no consolidation to suggest pneumonia. Heart size and mediastinal contours are normal, mild atherosclerosis of the aortic arch. There is no pleural effusion or pneumothorax. Pulmonary vasculature is normal. No acute osseous abnormalities are seen.  IMPRESSION: Progressive eventration of right hemidiaphragm with adjacent compressive atelectasis. The lungs are otherwise clear.   Electronically Signed   By: Jeb Levering M.D.   On: 06/19/2014 23:11   Ct Angio Chest Pe W/cm &/or Wo Cm  06/20/2014   CLINICAL DATA:  Cough and cold for 1 week. Nonproductive cough. Shortness of breath.  EXAM: CT ANGIOGRAPHY CHEST WITH CONTRAST  TECHNIQUE: Multidetector CT imaging of the chest was performed using the standard protocol during bolus administration of intravenous contrast. Multiplanar CT image reconstructions and MIPs were obtained to evaluate the vascular anatomy.  CONTRAST:  58mL OMNIPAQUE  IOHEXOL 350 MG/ML SOLN  COMPARISON:  Chest 06/19/2014  FINDINGS: Technically adequate study with moderately good opacification of the central and segmental pulmonary arteries. No focal filling defects are demonstrated. No evidence of significant pulmonary embolus  Normal heart size. Normal caliber thoracic aorta with torsion. No dissection. Great vessel origins are patent. Scattered lymph nodes in the aortopulmonic window are not pathologically enlarged. Esophagus is decompressed. Probable small esophageal hiatal hernia.  Elevation of the right hemidiaphragm with atelectasis in the lung bases, greater on the right. No pleural effusion. No pneumothorax. Mild peripheral bronchiectasis. Airways appear patent.  Included portions of the upper abdominal organs demonstrate no gross acute abnormality. Degenerative changes throughout the spine. No destructive bone lesions.  Review of the MIP images confirms the above findings.  IMPRESSION: No evidence of significant pulmonary embolus. Elevation of right hemidiaphragm with atelectasis in the right lung base.   Electronically Signed   By: Lucienne Capers M.D.   On: 06/20/2014 00:30     EKG: Independently reviewed. Normal sinus Rhythm at rate of 81, NO Acute S-T changes   Assessment/Plan:   64 y.o. female with  Principal Problem:   1.   SIRS/ CAP (community acquired pneumonia)   Blood Cultures Sent   IV Rocephin   IVAzithromycin   Albuterol Nebs   O2 PRN   Monitor O2 sats   Active Problems:   2.   Acute respiratory failure   O2 PRN   Monitor O2 sats     3.   Essential hypertension   Monitor BPS    Hold lasix and Hyzaar due to #5, and # 6     4.   Hypothyroidism   Continue Levothyroxine Rx     5.   Hypokalemia   Replete K+   Check Magnesium     6.   Hyponatremia- Due to Duiuretics   Hold Diuretics   Monitor Na+level     7.   DVT Prophylaxis   Lovenox        Code Status:     FULL CODE     Family Communication:   Daughter at  Bedside       Disposition Plan:    Inpatient  MED/Surg Unit      Time spent:  [facility] [place] [facility] Pager [phone]   If [place] Please Contact the Day Rounding Team MD for [facility]  If 7PM-7AM, Please Contact Night-Floor Coverage  [email or web address] Password [facility] 06/20/2014, 6:38 AM     ADDENDUM:  Patient was seen and examined on 06/20/2014

## 2014-06-20 NOTE — Progress Notes (Signed)
New Admission Note:   Arrival Method: per stretcher from ED with NT Robin and grand daughter Museum/gallery conservator Mental Orientation: alert, oriented X4, conversant Telemetry: none indicated Assessment: Completed Skin: warm, dry, intact with small scabbed abrasion on the right shin. Discoloration on the left shin secondary to healed ulcer. Skin assessed with RN Maudie Mercury. IV: G20 on the left AC with transparent dressing, clean, dry and intact Pain: denies having any pain as of this time Tubes: IV line and O2 tubing Safety Measures: Safety Fall Prevention Plan has been given, discussed and signed Admission: Completed [address] Orientation: Patient has been orientated to the room, unit and staff.  Family: grand daughter Amber at the bedside  Orders have been reviewed and implemented. Will continue to monitor the patient. Call light has been placed within reach and bed alarm has been activated.   Georgeanna Harrison BSN, RN [place] [address] [place]

## 2014-06-21 LAB — COMPREHENSIVE METABOLIC PANEL
ALT: 24 U/L (ref 0–35)
ANION GAP: 5 (ref 5–15)
AST: 37 U/L (ref 0–37)
Albumin: 2.8 g/dL — ABNORMAL LOW (ref 3.5–5.2)
Alkaline Phosphatase: 96 U/L (ref 39–117)
BUN: 8 mg/dL (ref 6–23)
CALCIUM: 8.3 mg/dL — AB (ref 8.4–10.5)
CO2: 32 mmol/L (ref 19–32)
CREATININE: 0.97 mg/dL (ref 0.50–1.10)
Chloride: 101 mmol/L (ref 96–112)
GFR, EST AFRICAN AMERICAN: 70 mL/min — AB (ref >90)
GFR, EST NON AFRICAN AMERICAN: 60 mL/min — AB (ref >90)
Glucose, Bld: 123 mg/dL — ABNORMAL HIGH (ref 70–99)
Potassium: 3.5 mmol/L (ref 3.5–5.1)
Sodium: 138 mmol/L (ref 135–145)
Total Bilirubin: 0.4 mg/dL (ref 0.3–1.2)
Total Protein: 6.7 g/dL (ref 6.0–8.3)

## 2014-06-21 LAB — CBC
HCT: 33.8 % — ABNORMAL LOW (ref 36.0–46.0)
Hemoglobin: 10.6 g/dL — ABNORMAL LOW (ref 12.0–15.0)
MCH: 25.9 pg — ABNORMAL LOW (ref 26.0–34.0)
MCHC: 31.4 g/dL (ref 30.0–36.0)
MCV: 82.6 fL (ref 78.0–100.0)
PLATELETS: 110 10*3/uL — AB (ref 150–400)
RBC: 4.09 MIL/uL (ref 3.87–5.11)
RDW: 16 % — AB (ref 11.5–15.5)
WBC: 3.3 10*3/uL — ABNORMAL LOW (ref 4.0–10.5)

## 2014-06-21 LAB — INFLUENZA PANEL BY PCR (TYPE A & B)
H1N1FLUPCR: DETECTED — AB
INFLAPCR: POSITIVE — AB
INFLBPCR: NEGATIVE

## 2014-06-21 MED ORDER — AZITHROMYCIN 500 MG PO TABS: 500.0000 mg | ORAL_TABLET | Freq: Every day | ORAL | Status: DC

## 2014-06-21 MED ORDER — LOSARTAN POTASSIUM-HCTZ 100-25 MG PO TABS: 1.0000 | ORAL_TABLET | Freq: Every day | ORAL | Status: DC

## 2014-06-21 MED ORDER — ZOLPIDEM TARTRATE 5 MG PO TABS
5.0000 mg | ORAL_TABLET | Freq: Once | ORAL | Status: AC
  Administered 2014-06-21: 5 mg via ORAL
  Filled 2014-06-21: qty 1

## 2014-06-21 MED ORDER — ZOLPIDEM TARTRATE 5 MG PO TABS
5.0000 mg | ORAL_TABLET | Freq: Once | ORAL | Status: AC
  Administered 2014-06-21: 5 mg via ORAL

## 2014-06-21 MED ORDER — OSELTAMIVIR PHOSPHATE 75 MG PO CAPS: 75.0000 mg | ORAL_CAPSULE | Freq: Two times a day (BID) | ORAL | Status: DC

## 2014-06-21 MED ORDER — HYDROCODONE-HOMATROPINE 5-1.5 MG/5ML PO SYRP: 5.0000 mL | ORAL_SOLUTION | Freq: Four times a day (QID) | ORAL | Status: DC | PRN

## 2014-06-21 MED ORDER — OSELTAMIVIR PHOSPHATE 75 MG PO CAPS: 75.0000 mg | ORAL_CAPSULE | Freq: Once | ORAL | Status: DC

## 2014-06-21 MED ORDER — OSELTAMIVIR PHOSPHATE 75 MG PO CAPS
75.0000 mg | ORAL_CAPSULE | Freq: Two times a day (BID) | ORAL | Status: DC
  Administered 2014-06-21: 75 mg via ORAL
  Filled 2014-06-21 (×2): qty 1

## 2014-06-21 MED ORDER — ALBUTEROL SULFATE HFA 108 (90 BASE) MCG/ACT IN AERS: 2.0000 | INHALATION_SPRAY | Freq: Four times a day (QID) | RESPIRATORY_TRACT | Status: AC | PRN

## 2014-06-21 NOTE — Progress Notes (Signed)
{  PT ALL SATURATION QUALIFICATIONS: (This note is used to comply with regulatory documentation for home oxygen)  Patient Saturations on Room Air at Rest = 97%  Patient Saturations on Room Air while Ambulating = 91-94%  [name] [name], [place] Pager [phone] 06/21/2014

## 2014-06-21 NOTE — Evaluation (Signed)
Physical Therapy Evaluation and discharge Patient Details Name: [name] [name] MRN: [id number] DOB: 10/06/1950 Today's Date: 06/21/2014   History of Present Illness  Pt admitted with PNA.  Clinical Impression  Pt ambulating at MOD I level with 02 sats in 90s' on room air with no SOB noted.  No skilled PT needs identified and will sign off.  Please re-order PT if status changes.    Follow Up Recommendations No PT follow up    Equipment Recommendations  None recommended by PT    Recommendations for Other Services       Precautions / Restrictions Precautions Precautions: None Restrictions Weight Bearing Restrictions: No      Mobility  Bed Mobility Overal bed mobility: Modified Independent             General bed mobility comments: HOB elevated. Pt reports she sleeps in recliner.  Transfers Overall transfer level: Modified independent                  Ambulation/Gait Ambulation/Gait assistance: Modified independent (Device/Increase time) Ambulation Distance (Feet): 200 Feet Assistive device: None Gait Pattern/deviations: WFL(Within Functional Limits)     General Gait Details: amb on room air with sat 91-94% and 97% at rest after gait. Pt amb with no AD and no LOB. Pt does report soem arthritic soreness in R hip after gait.  Stairs            Wheelchair Mobility    Modified Rankin (Stroke Patients Only)       Balance Overall balance assessment: No apparent balance deficits (not formally assessed)                                           Pertinent Vitals/Pain Pain Assessment: No/denies pain    Home Living Family/patient expects to be discharged to:: Private residence Living Arrangements: Alone Available Help at Discharge: Available PRN/intermittently;Family Type of Home: Apartment Home Access: Level entry     Home Layout: One level Home Equipment: Cane - single point;Walker - 2 wheels;Bedside commode      Prior  Function Level of Independence: Independent         Comments: occasional use of cane     Hand Dominance        Extremity/Trunk Assessment   Upper Extremity Assessment: Overall WFL for tasks assessed           Lower Extremity Assessment: Overall WFL for tasks assessed      Cervical / Trunk Assessment: Normal  Communication   Communication: No difficulties  Cognition Arousal/Alertness: Awake/alert Behavior During Therapy: WFL for tasks assessed/performed Overall Cognitive Status: Within Functional Limits for tasks assessed                      General Comments      Exercises        Assessment/Plan    PT Assessment Patent does not need any further PT services  PT Diagnosis Difficulty walking   PT Problem List    PT Treatment Interventions     PT Goals (Current goals can be found in the Care Plan section) Acute Rehab PT Goals PT Goal Formulation: All assessment and education complete, DC therapy    Frequency     Barriers to discharge        Co-evaluation  End of Session Equipment Utilized During Treatment: Gait belt Activity Tolerance: Patient tolerated treatment well Patient left: in chair;with family/visitor present;with call bell/phone within reach Nurse Communication: Mobility status;Other (comment) (02 sats)         Time: 1211-1223 PT Time Calculation (min) (ACUTE ONLY): 12 min   Charges:   PT Evaluation $Initial PT Evaluation Tier I: 1 Procedure     PT G Codes:        [name] [name] LUBECK 06/21/2014, 12:38 PM

## 2014-06-21 NOTE — Plan of Care (Signed)
Problem: Discharge Progression Outcomes Goal: Vaccine documented on D/C instructions Outcome: Completed/Met Date Met:  06/21/14 Had vaccines as per documentation screening.

## 2014-06-21 NOTE — Clinical Social Work Note (Signed)
CSW-Intern spoke with [name] [name] about her living arrangements . Patient stated she doesn't live at [facility] and [place], but in a retirement community named [facility] CSW-Intern confirmed that she doesn't receive any nursing care at her residence. Patient states her son [name] ([phone]) will transport her home today at discharge.   [name] CW-Intern [phone]

## 2014-06-21 NOTE — Progress Notes (Signed)
UR Completed.  [phone]  

## 2014-06-21 NOTE — Discharge Summary (Signed)
Physician Discharge Summary  [name] [name] MRN: [id number] DOB/AGE: 65-Mar-1952 65 y.o.  PCP: No primary care provider on file.   Admit date: 06/19/2014 Discharge date: 06/21/2014  Discharge Diagnoses:  Influenza A :   CAP (community acquired pneumonia) Active Problems:   Hypothyroidism   Essential hypertension   Acute respiratory failure   Hypokalemia   Hyponatremia   SIRS (systemic inflammatory response syndrome)   Follow-up recommendations Follow-up with PCP in 5-7 days Follow-up CBC, CMP in 1 week     Medication List    TAKE these medications        albuterol 108 (90 BASE) MCG/ACT inhaler  Commonly known as:  PROVENTIL HFA;VENTOLIN HFA  Inhale 2 puffs into the lungs every 6 (six) hours as needed for wheezing or shortness of breath.     azithromycin 500 MG tablet  Commonly known as:  ZITHROMAX  Take 1 tablet (500 mg total) by mouth daily.     budesonide-formoterol 160-4.5 MCG/ACT inhaler  Commonly known as:  SYMBICORT  Inhale 2 puffs into the lungs 2 (two) times daily.     CALCIUM PO  Take 1 tablet by mouth daily.     cholecalciferol 1000 UNITS tablet  Commonly known as:  VITAMIN D  Take 2,000 Units by mouth daily.     diphenhydramine-acetaminophen 25-500 MG Tabs  Commonly known as:  TYLENOL PM  Take 2 tablets by mouth at bedtime as needed (pain/sleep).     docusate sodium 100 MG capsule  Commonly known as:  COLACE  Take 200 mg by mouth 2 (two) times daily as needed for mild constipation.     fluticasone 50 MCG/ACT nasal spray  Commonly known as:  FLONASE  Place 2 sprays into both nostrils daily as needed for allergies or rhinitis.     furosemide 40 MG tablet  Commonly known as:  LASIX  Take 40 mg by mouth daily.     HYDROcodone-homatropine 5-1.5 MG/5ML syrup  Commonly known as:  HYCODAN  Take 5 mLs by mouth every 6 (six) hours as needed for cough.     levothyroxine 112 MCG tablet  Commonly known as:  SYNTHROID, LEVOTHROID  Take 112 mcg  by mouth daily before breakfast.     losartan-hydrochlorothiazide 100-25 MG per tablet  Commonly known as:  HYZAAR  Take 1 tablet by mouth daily.  Start taking on:  06/27/2014     omega-3 acid ethyl esters 1 G capsule  Commonly known as:  LOVAZA  Take 4 g by mouth daily.     omeprazole 20 MG capsule  Commonly known as:  PRILOSEC  Take 20 mg by mouth daily.     oseltamivir 75 MG capsule  Commonly known as:  TAMIFLU  Take 1 capsule (75 mg total) by mouth 2 (two) times daily.     potassium chloride SA 20 MEQ tablet  Commonly known as:  K-DUR,KLOR-CON  Take 40 mEq by mouth daily.     PRESCRIPTION MEDICATION  Take 1 tablet by mouth daily. Depression pill     VITAMIN B-12 PO  Take 2,000 mg by mouth daily.        Discharge Condition:   Disposition: Final discharge disposition not confirmed   Consults:   Significant Diagnostic Studies: Dg Chest 2 View  06/19/2014   CLINICAL DATA:  Cough for a few weeks.  Shortness of breath.  Fever.  EXAM: CHEST  2 VIEW  COMPARISON:  02/12/2011, 09/18/2010  FINDINGS: Compared to prior exam there is progressive  eventration of right hemidiaphragm. Minimal adjacent atelectasis, likely compressive. There is no consolidation to suggest pneumonia. Heart size and mediastinal contours are normal, mild atherosclerosis of the aortic arch. There is no pleural effusion or pneumothorax. Pulmonary vasculature is normal. No acute osseous abnormalities are seen.  IMPRESSION: Progressive eventration of right hemidiaphragm with adjacent compressive atelectasis. The lungs are otherwise clear.   Electronically Signed   By: Jeb Levering M.D.   On: 06/19/2014 23:11   Ct Angio Chest Pe W/cm &/or Wo Cm  06/20/2014   CLINICAL DATA:  Cough and cold for 1 week. Nonproductive cough. Shortness of breath.  EXAM: CT ANGIOGRAPHY CHEST WITH CONTRAST  TECHNIQUE: Multidetector CT imaging of the chest was performed using the standard protocol during bolus administration of  intravenous contrast. Multiplanar CT image reconstructions and MIPs were obtained to evaluate the vascular anatomy.  CONTRAST:  82m OMNIPAQUE IOHEXOL 350 MG/ML SOLN  COMPARISON:  Chest 06/19/2014  FINDINGS: Technically adequate study with moderately good opacification of the central and segmental pulmonary arteries. No focal filling defects are demonstrated. No evidence of significant pulmonary embolus  Normal heart size. Normal caliber thoracic aorta with torsion. No dissection. Great vessel origins are patent. Scattered lymph nodes in the aortopulmonic window are not pathologically enlarged. Esophagus is decompressed. Probable small esophageal hiatal hernia.  Elevation of the right hemidiaphragm with atelectasis in the lung bases, greater on the right. No pleural effusion. No pneumothorax. Mild peripheral bronchiectasis. Airways appear patent.  Included portions of the upper abdominal organs demonstrate no gross acute abnormality. Degenerative changes throughout the spine. No destructive bone lesions.  Review of the MIP images confirms the above findings.  IMPRESSION: No evidence of significant pulmonary embolus. Elevation of right hemidiaphragm with atelectasis in the right lung base.   Electronically Signed   By: WLucienne CapersM.D.   On: 06/20/2014 00:30      Microbiology: No results found for this or any previous visit (from the past 240 hour(s)).   Labs: Results for orders placed or performed during the hospital encounter of 06/19/14 (from the past 48 hour(s))  CBC with Differential     Status: Abnormal   Collection Time: 06/19/14 10:46 PM  Result Value Ref Range   WBC 6.2 4.0 - 10.5 K/uL   RBC 4.40 3.87 - 5.11 MIL/uL   Hemoglobin 11.4 (L) 12.0 - 15.0 g/dL   HCT 35.1 (L) 36.0 - 46.0 %   MCV 79.8 78.0 - 100.0 fL   MCH 25.9 (L) 26.0 - 34.0 pg   MCHC 32.5 30.0 - 36.0 g/dL   RDW 15.6 (H) 11.5 - 15.5 %   Platelets 136 (L) 150 - 400 K/uL   Neutrophils Relative % 82 (H) 43 - 77 %   Neutro  Abs 5.0 1.7 - 7.7 K/uL   Lymphocytes Relative 9 (L) 12 - 46 %   Lymphs Abs 0.6 (L) 0.7 - 4.0 K/uL   Monocytes Relative 9 3 - 12 %   Monocytes Absolute 0.6 0.1 - 1.0 K/uL   Eosinophils Relative 0 0 - 5 %   Eosinophils Absolute 0.0 0.0 - 0.7 K/uL   Basophils Relative 0 0 - 1 %   Basophils Absolute 0.0 0.0 - 0.1 K/uL  Comprehensive metabolic panel     Status: Abnormal   Collection Time: 06/19/14 10:46 PM  Result Value Ref Range   Sodium 132 (L) 135 - 145 mmol/L   Potassium 2.8 (L) 3.5 - 5.1 mmol/L   Chloride 92 (L) 96 -  112 mmol/L   CO2 28 19 - 32 mmol/L   Glucose, Bld 121 (H) 70 - 99 mg/dL   BUN 10 6 - 23 mg/dL   Creatinine, Ser 1.16 (H) 0.50 - 1.10 mg/dL   Calcium 8.3 (L) 8.4 - 10.5 mg/dL   Total Protein 7.4 6.0 - 8.3 g/dL   Albumin 3.2 (L) 3.5 - 5.2 g/dL   AST 32 0 - 37 U/L   ALT 27 0 - 35 U/L   Alkaline Phosphatase 113 39 - 117 U/L   Total Bilirubin 0.9 0.3 - 1.2 mg/dL   GFR calc non Af Amer 49 (L) >90 mL/min   GFR calc Af Amer 56 (L) >90 mL/min    Comment: (NOTE) The eGFR has been calculated using the CKD EPI equation. This calculation has not been validated in all clinical situations. eGFR's persistently <90 mL/min signify possible Chronic Kidney Disease.    Anion gap 12 5 - 15  I-stat troponin, ED     Status: None   Collection Time: 06/20/14  1:16 AM  Result Value Ref Range   Troponin i, poc 0.05 0.00 - 0.08 ng/mL   Comment 3            Comment: Due to the release kinetics of cTnI, a negative result within the first hours of the onset of symptoms does not rule out myocardial infarction with certainty. If myocardial infarction is still suspected, repeat the test at appropriate intervals.   Urinalysis, Routine w reflex microscopic     Status: Abnormal   Collection Time: 06/20/14  4:42 AM  Result Value Ref Range   Color, Urine YELLOW YELLOW   APPearance CLOUDY (A) CLEAR   Specific Gravity, Urine 1.040 (H) 1.005 - 1.030   pH 6.0 5.0 - 8.0   Glucose, UA NEGATIVE  NEGATIVE mg/dL   Hgb urine dipstick NEGATIVE NEGATIVE   Bilirubin Urine NEGATIVE NEGATIVE   Ketones, ur NEGATIVE NEGATIVE mg/dL   Protein, ur NEGATIVE NEGATIVE mg/dL   Urobilinogen, UA 1.0 0.0 - 1.0 mg/dL   Nitrite NEGATIVE NEGATIVE   Leukocytes, UA MODERATE (A) NEGATIVE  Urine microscopic-add on     Status: Abnormal   Collection Time: 06/20/14  4:42 AM  Result Value Ref Range   Squamous Epithelial / LPF FEW (A) RARE   WBC, UA 3-6 <3 WBC/hpf   RBC / HPF 0-2 <3 RBC/hpf   Bacteria, UA FEW (A) RARE  Comprehensive metabolic panel     Status: Abnormal   Collection Time: 06/20/14  9:41 AM  Result Value Ref Range   Sodium 135 135 - 145 mmol/L   Potassium 2.7 (LL) 3.5 - 5.1 mmol/L    Comment: REPEATED TO VERIFY CRITICAL RESULT CALLED TO, READ BACK BY AND VERIFIED WITH: C.CHANEY,RN [id number] 06/20/14 CLARK,S    Chloride 92 (L) 96 - 112 mmol/L   CO2 30 19 - 32 mmol/L   Glucose, Bld 127 (H) 70 - 99 mg/dL   BUN 7 6 - 23 mg/dL   Creatinine, Ser 1.10 0.50 - 1.10 mg/dL   Calcium 8.5 8.4 - 10.5 mg/dL   Total Protein 7.4 6.0 - 8.3 g/dL   Albumin 2.9 (L) 3.5 - 5.2 g/dL   AST 36 0 - 37 U/L   ALT 27 0 - 35 U/L   Alkaline Phosphatase 109 39 - 117 U/L   Total Bilirubin 0.5 0.3 - 1.2 mg/dL   GFR calc non Af Amer 52 (L) >90 mL/min   GFR calc Af Amer 60 (  L) >90 mL/min    Comment: (NOTE) The eGFR has been calculated using the CKD EPI equation. This calculation has not been validated in all clinical situations. eGFR's persistently <90 mL/min signify possible Chronic Kidney Disease.    Anion gap 13 5 - 15  Magnesium     Status: None   Collection Time: 06/20/14  9:41 AM  Result Value Ref Range   Magnesium 1.7 1.5 - 2.5 mg/dL  Influenza panel by PCR (type A & B, H1N1)     Status: Abnormal   Collection Time: 06/20/14  9:33 PM  Result Value Ref Range   Influenza A By PCR POSITIVE (A) NEGATIVE   Influenza B By PCR NEGATIVE NEGATIVE   H1N1 flu by pcr DETECTED (A) NOT DETECTED    Comment:        The  Xpert Flu assay (FDA approved for nasal aspirates or washes and nasopharyngeal swab specimens), is intended as an aid in the diagnosis of influenza and should not be used as a sole basis for treatment.   CBC     Status: Abnormal   Collection Time: 06/21/14  5:55 AM  Result Value Ref Range   WBC 3.3 (L) 4.0 - 10.5 K/uL   RBC 4.09 3.87 - 5.11 MIL/uL   Hemoglobin 10.6 (L) 12.0 - 15.0 g/dL   HCT 33.8 (L) 36.0 - 46.0 %   MCV 82.6 78.0 - 100.0 fL   MCH 25.9 (L) 26.0 - 34.0 pg   MCHC 31.4 30.0 - 36.0 g/dL   RDW 16.0 (H) 11.5 - 15.5 %   Platelets 110 (L) 150 - 400 K/uL    Comment: REPEATED TO VERIFY PLATELET COUNT CONFIRMED BY SMEAR   Comprehensive metabolic panel     Status: Abnormal   Collection Time: 06/21/14  5:55 AM  Result Value Ref Range   Sodium 138 135 - 145 mmol/L   Potassium 3.5 3.5 - 5.1 mmol/L    Comment: DELTA CHECK NOTED   Chloride 101 96 - 112 mmol/L   CO2 32 19 - 32 mmol/L   Glucose, Bld 123 (H) 70 - 99 mg/dL   BUN 8 6 - 23 mg/dL   Creatinine, Ser 0.97 0.50 - 1.10 mg/dL   Calcium 8.3 (L) 8.4 - 10.5 mg/dL   Total Protein 6.7 6.0 - 8.3 g/dL   Albumin 2.8 (L) 3.5 - 5.2 g/dL   AST 37 0 - 37 U/L   ALT 24 0 - 35 U/L   Alkaline Phosphatase 96 39 - 117 U/L   Total Bilirubin 0.4 0.3 - 1.2 mg/dL   GFR calc non Af Amer 60 (L) >90 mL/min   GFR calc Af Amer 70 (L) >90 mL/min    Comment: (NOTE) The eGFR has been calculated using the CKD EPI equation. This calculation has not been validated in all clinical situations. eGFR's persistently <90 mL/min signify possible Chronic Kidney Disease.    Anion gap 5 5 - 15     HPI :[name] [name] is a 65 y.o. female with history of CAD, HTN, Hypothyroid, and Previous CVA who presents to the ED with complaints of worsening SOB over the past 2-3 days and increased cough and chest congestion x 1 month. She was hypoxic to 87% on arrival and was also found to have a fever to 103.2, She was evaluated and found to have a clinical  pneumonia and was placed on IV Rocephin and Azithromycin to cover CAP. Her daughter had been sick with the flu 1 month  ago and she Has been sick ever since then   HOSPITAL COURSE:   Acute bronchitis/flu PCR positive for influenza A Continue azithromycin for 7 days Continue Tamiflu for 5 days Assess home oxygen needs   Essential hypertension Hold Hyzaar for another week  Hypothyroidism continue Synthroid  Hypokalemia repleted  Hyponatremia with a sodium of 132 upon admission, likely secondary to Hyzaar, PCP to reassess Sodium 138 prior to discharge  Hypokalemia repleted, recheck him a continue potassium supplementation  Platelets mild thrombocytopenia likely secondary to the viral infection hopefully will improve   Discharge Exam:    Blood pressure 131/51, pulse 69, temperature 98.4 F (36.9 C), temperature source Oral, resp. rate 18, height 5' 10" (1.778 m), weight 119.205 kg (262 lb 12.8 oz), SpO2 96 %.  Cardiovascular: Normal rate and regular rhythm. Exam reveals no gallop and no friction rub.  No murmur heard. Pulmonary/Chest: Effort normal. She has no wheezes. She has no rales. She exhibits no tenderness.  Crackles noted in the RLL.  Abdominal: Soft. There is no tenderness.  Musculoskeletal: Normal range of motion.  Neurological: She is alert and oriented to person, place, and time. Coordination normal.        Discharge Instructions    Diet - low sodium heart healthy    Complete by:  As directed      Increase activity slowly    Complete by:  As directed            Follow-up Information    Follow up with PCP. Schedule an appointment as soon as possible for a visit in 1 week.      SignedReyne Dumas 06/21/2014, 11:24 AM         Thank you

## 2014-07-06 DIAGNOSIS — Z79899 Other long term (current) drug therapy: Secondary | ICD-10-CM | POA: Diagnosis not present

## 2014-07-06 DIAGNOSIS — Z09 Encounter for follow-up examination after completed treatment for conditions other than malignant neoplasm: Secondary | ICD-10-CM | POA: Diagnosis not present

## 2014-07-06 DIAGNOSIS — E876 Hypokalemia: Secondary | ICD-10-CM | POA: Diagnosis not present

## 2014-07-06 DIAGNOSIS — J309 Allergic rhinitis, unspecified: Secondary | ICD-10-CM | POA: Diagnosis not present

## 2014-07-06 DIAGNOSIS — E559 Vitamin D deficiency, unspecified: Secondary | ICD-10-CM | POA: Diagnosis not present

## 2014-07-06 DIAGNOSIS — R05 Cough: Secondary | ICD-10-CM | POA: Diagnosis not present

## 2014-07-06 DIAGNOSIS — E039 Hypothyroidism, unspecified: Secondary | ICD-10-CM | POA: Diagnosis not present

## 2014-07-06 DIAGNOSIS — J45909 Unspecified asthma, uncomplicated: Secondary | ICD-10-CM | POA: Diagnosis not present

## 2014-07-18 DIAGNOSIS — E876 Hypokalemia: Secondary | ICD-10-CM | POA: Diagnosis not present

## 2014-07-27 DIAGNOSIS — D649 Anemia, unspecified: Secondary | ICD-10-CM | POA: Diagnosis not present

## 2014-07-27 DIAGNOSIS — Z1231 Encounter for screening mammogram for malignant neoplasm of breast: Secondary | ICD-10-CM | POA: Diagnosis not present

## 2014-07-27 DIAGNOSIS — I10 Essential (primary) hypertension: Secondary | ICD-10-CM | POA: Diagnosis not present

## 2014-07-27 DIAGNOSIS — Z1212 Encounter for screening for malignant neoplasm of rectum: Secondary | ICD-10-CM | POA: Diagnosis not present

## 2014-07-27 DIAGNOSIS — Z124 Encounter for screening for malignant neoplasm of cervix: Secondary | ICD-10-CM | POA: Diagnosis not present

## 2014-07-27 DIAGNOSIS — Z23 Encounter for immunization: Secondary | ICD-10-CM | POA: Diagnosis not present

## 2014-07-27 DIAGNOSIS — Z79899 Other long term (current) drug therapy: Secondary | ICD-10-CM | POA: Diagnosis not present

## 2014-07-27 DIAGNOSIS — Z0000 Encounter for general adult medical examination without abnormal findings: Secondary | ICD-10-CM | POA: Diagnosis not present

## 2014-07-27 DIAGNOSIS — E538 Deficiency of other specified B group vitamins: Secondary | ICD-10-CM | POA: Diagnosis not present

## 2014-09-29 DIAGNOSIS — H6590 Unspecified nonsuppurative otitis media, unspecified ear: Secondary | ICD-10-CM | POA: Diagnosis not present

## 2014-09-29 DIAGNOSIS — J45909 Unspecified asthma, uncomplicated: Secondary | ICD-10-CM | POA: Diagnosis not present

## 2014-09-29 DIAGNOSIS — Z6841 Body Mass Index (BMI) 40.0 and over, adult: Secondary | ICD-10-CM | POA: Diagnosis not present

## 2014-09-29 DIAGNOSIS — N3281 Overactive bladder: Secondary | ICD-10-CM | POA: Diagnosis not present

## 2014-09-29 DIAGNOSIS — J309 Allergic rhinitis, unspecified: Secondary | ICD-10-CM | POA: Diagnosis not present

## 2014-10-27 DIAGNOSIS — I10 Essential (primary) hypertension: Secondary | ICD-10-CM | POA: Diagnosis not present

## 2014-10-27 DIAGNOSIS — E785 Hyperlipidemia, unspecified: Secondary | ICD-10-CM | POA: Diagnosis not present

## 2014-10-27 DIAGNOSIS — E559 Vitamin D deficiency, unspecified: Secondary | ICD-10-CM | POA: Diagnosis not present

## 2014-10-27 DIAGNOSIS — E039 Hypothyroidism, unspecified: Secondary | ICD-10-CM | POA: Diagnosis not present

## 2014-10-27 DIAGNOSIS — J309 Allergic rhinitis, unspecified: Secondary | ICD-10-CM | POA: Diagnosis not present

## 2014-12-13 DIAGNOSIS — Z86010 Personal history of colonic polyps: Secondary | ICD-10-CM | POA: Diagnosis not present

## 2014-12-13 DIAGNOSIS — Z8371 Family history of colonic polyps: Secondary | ICD-10-CM | POA: Diagnosis not present

## 2014-12-13 DIAGNOSIS — Z1211 Encounter for screening for malignant neoplasm of colon: Secondary | ICD-10-CM | POA: Diagnosis not present

## 2015-01-19 DIAGNOSIS — I252 Old myocardial infarction: Secondary | ICD-10-CM | POA: Diagnosis not present

## 2015-01-19 DIAGNOSIS — Z8371 Family history of colonic polyps: Secondary | ICD-10-CM | POA: Diagnosis not present

## 2015-01-19 DIAGNOSIS — D122 Benign neoplasm of ascending colon: Secondary | ICD-10-CM | POA: Diagnosis not present

## 2015-01-19 DIAGNOSIS — K648 Other hemorrhoids: Secondary | ICD-10-CM | POA: Diagnosis not present

## 2015-01-19 DIAGNOSIS — Z1211 Encounter for screening for malignant neoplasm of colon: Secondary | ICD-10-CM | POA: Diagnosis not present

## 2015-01-19 DIAGNOSIS — Z6841 Body Mass Index (BMI) 40.0 and over, adult: Secondary | ICD-10-CM | POA: Diagnosis not present

## 2015-01-19 DIAGNOSIS — Z86010 Personal history of colonic polyps: Secondary | ICD-10-CM | POA: Diagnosis not present

## 2015-01-19 DIAGNOSIS — E785 Hyperlipidemia, unspecified: Secondary | ICD-10-CM | POA: Diagnosis not present

## 2015-01-19 DIAGNOSIS — F329 Major depressive disorder, single episode, unspecified: Secondary | ICD-10-CM | POA: Diagnosis not present

## 2015-01-19 DIAGNOSIS — I10 Essential (primary) hypertension: Secondary | ICD-10-CM | POA: Diagnosis not present

## 2015-01-19 DIAGNOSIS — J45909 Unspecified asthma, uncomplicated: Secondary | ICD-10-CM | POA: Diagnosis not present

## 2015-01-19 DIAGNOSIS — E669 Obesity, unspecified: Secondary | ICD-10-CM | POA: Diagnosis not present

## 2015-01-19 DIAGNOSIS — I4891 Unspecified atrial fibrillation: Secondary | ICD-10-CM | POA: Diagnosis not present

## 2015-01-19 DIAGNOSIS — E039 Hypothyroidism, unspecified: Secondary | ICD-10-CM | POA: Diagnosis not present

## 2015-01-19 DIAGNOSIS — K219 Gastro-esophageal reflux disease without esophagitis: Secondary | ICD-10-CM | POA: Diagnosis not present

## 2015-01-19 DIAGNOSIS — K5730 Diverticulosis of large intestine without perforation or abscess without bleeding: Secondary | ICD-10-CM | POA: Diagnosis not present

## 2015-01-19 HISTORY — PX: COLONOSCOPY: SHX174

## 2015-02-10 ENCOUNTER — Emergency Department (HOSPITAL_COMMUNITY)
Admission: EM | Admit: 2015-02-10 | Discharge: 2015-02-10 | Disposition: A | Discharge status: Home / Self Care | Payer: Medicare Other | Attending: Emergency Medicine | Admitting: Emergency Medicine

## 2015-02-10 ENCOUNTER — Emergency Department (EMERGENCY_DEPARTMENT_HOSPITAL): Payer: Medicare Other

## 2015-02-10 ENCOUNTER — Encounter (HOSPITAL_COMMUNITY): Payer: Self-pay | Admitting: Family Medicine

## 2015-02-10 ENCOUNTER — Emergency Department (HOSPITAL_COMMUNITY): Payer: Medicare Other

## 2015-02-10 DIAGNOSIS — R229 Localized swelling, mass and lump, unspecified: Secondary | ICD-10-CM | POA: Diagnosis not present

## 2015-02-10 DIAGNOSIS — Y92012 Bathroom of single-family (private) house as the place of occurrence of the external cause: Secondary | ICD-10-CM | POA: Insufficient documentation

## 2015-02-10 DIAGNOSIS — E039 Hypothyroidism, unspecified: Secondary | ICD-10-CM | POA: Diagnosis not present

## 2015-02-10 DIAGNOSIS — Z79899 Other long term (current) drug therapy: Secondary | ICD-10-CM | POA: Insufficient documentation

## 2015-02-10 DIAGNOSIS — Y998 Other external cause status: Secondary | ICD-10-CM | POA: Diagnosis not present

## 2015-02-10 DIAGNOSIS — I10 Essential (primary) hypertension: Secondary | ICD-10-CM | POA: Diagnosis not present

## 2015-02-10 DIAGNOSIS — Z792 Long term (current) use of antibiotics: Secondary | ICD-10-CM | POA: Insufficient documentation

## 2015-02-10 DIAGNOSIS — Z793 Long term (current) use of hormonal contraceptives: Secondary | ICD-10-CM | POA: Insufficient documentation

## 2015-02-10 DIAGNOSIS — S99921A Unspecified injury of right foot, initial encounter: Secondary | ICD-10-CM | POA: Diagnosis not present

## 2015-02-10 DIAGNOSIS — I252 Old myocardial infarction: Secondary | ICD-10-CM | POA: Diagnosis not present

## 2015-02-10 DIAGNOSIS — Z7951 Long term (current) use of inhaled steroids: Secondary | ICD-10-CM | POA: Diagnosis not present

## 2015-02-10 DIAGNOSIS — M7989 Other specified soft tissue disorders: Secondary | ICD-10-CM | POA: Diagnosis not present

## 2015-02-10 DIAGNOSIS — W2209XA Striking against other stationary object, initial encounter: Secondary | ICD-10-CM | POA: Insufficient documentation

## 2015-02-10 DIAGNOSIS — Y9389 Activity, other specified: Secondary | ICD-10-CM | POA: Diagnosis not present

## 2015-02-10 MED ORDER — TRAMADOL HCL 50 MG PO TABS: 50.0000 mg | ORAL_TABLET | Freq: Four times a day (QID) | ORAL | Status: DC | PRN

## 2015-02-10 MED ORDER — OXYCODONE-ACETAMINOPHEN 5-325 MG PO TABS
1.0000 | ORAL_TABLET | Freq: Once | ORAL | Status: AC
  Administered 2015-02-10: 1 via ORAL
  Filled 2015-02-10: qty 1

## 2015-02-10 MED ORDER — CEPHALEXIN 500 MG PO CAPS: 500.0000 mg | ORAL_CAPSULE | Freq: Three times a day (TID) | ORAL | Status: DC

## 2015-02-10 NOTE — Discharge Instructions (Signed)
Hematoma A hematoma is a collection of blood under the skin, in an organ, in a body space, in a joint space, or in other tissue. The blood can clot to form a lump that you can see and feel. The lump is often firm and may sometimes become sore and tender. Most hematomas get better in a few days to weeks. However, some hematomas may be serious and require medical care. Hematomas can range in size from very small to very large. CAUSES  A hematoma can be caused by a blunt or penetrating injury. It can also be caused by spontaneous leakage from a blood vessel under the skin. Spontaneous leakage from a blood vessel is more likely to occur in older people, especially those taking blood thinners. Sometimes, a hematoma can develop after certain medical procedures. SIGNS AND SYMPTOMS   A firm lump on the body.  Possible pain and tenderness in the area.  Bruising.Blue, dark blue, purple-red, or yellowish skin may appear at the site of the hematoma if the hematoma is close to the surface of the skin. For hematomas in deeper tissues or body spaces, the signs and symptoms may be subtle. For example, an intra-abdominal hematoma may cause abdominal pain, weakness, fainting, and shortness of breath. An intracranial hematoma may cause a headache or symptoms such as weakness, trouble speaking, or a change in consciousness. DIAGNOSIS  A hematoma can usually be diagnosed based on your medical history and a physical exam. Imaging tests may be needed if your health care provider suspects a hematoma in deeper tissues or body spaces, such as the abdomen, head, or chest. These tests may include ultrasonography or a CT scan.  TREATMENT  Hematomas usually go away on their own over time. Rarely does the blood need to be drained out of the body. Large hematomas or those that may affect vital organs will sometimes need surgical drainage or monitoring. HOME CARE INSTRUCTIONS   Apply ice to the injured area:   Put ice in a  plastic bag.   Place a towel between your skin and the bag.   Leave the ice on for 20 minutes, 2-3 times a day for the first 1 to 2 days.   After the first 2 days, switch to using warm compresses on the hematoma.   Elevate the injured area to help decrease pain and swelling. Wrapping the area with an elastic bandage may also be helpful. Compression helps to reduce swelling and promotes shrinking of the hematoma. Make sure the bandage is not wrapped too tight.   If your hematoma is on a lower extremity and is painful, crutches may be helpful for a couple days.   Only take over-the-counter or prescription medicines as directed by your health care provider. SEEK IMMEDIATE MEDICAL CARE IF:   You have increasing pain, or your pain is not controlled with medicine.   You have a fever.   You have worsening swelling or discoloration.   Your skin over the hematoma breaks or starts bleeding.   Your hematoma is in your chest or abdomen and you have weakness, shortness of breath, or a change in consciousness.  Your hematoma is on your scalp (caused by a fall or injury) and you have a worsening headache or a change in alertness or consciousness. MAKE SURE YOU:   Understand these instructions.  Will watch your condition.  Will get help right away if you are not doing well or get worse.   This information is not intended to replace  advice given to you by your health care provider. Make sure you discuss any questions you have with your health care provider.   Document Released: 11/28/2003 Document Revised: 12/16/2012 Document Reviewed: 09/23/2012 Elsevier Interactive Patient Education 2016 [employer]   Cellulitis Cellulitis is an infection of the skin and the tissue beneath it. The infected area is usually red and tender. Cellulitis occurs most often in the arms and lower legs.  CAUSES  Cellulitis is caused by bacteria that enter the skin through cracks or cuts in the skin.  The most common types of bacteria that cause cellulitis are staphylococci and streptococci. SIGNS AND SYMPTOMS   Redness and warmth.  Swelling.  Tenderness or pain.  Fever. DIAGNOSIS  Your health care provider can usually determine what is wrong based on a physical exam. Blood tests may also be done. TREATMENT  Treatment usually involves taking an antibiotic medicine. HOME CARE INSTRUCTIONS   Take your antibiotic medicine as directed by your health care provider. Finish the antibiotic even if you start to feel better.  Keep the infected arm or leg elevated to reduce swelling.  Apply a warm cloth to the affected area up to 4 times per day to relieve pain.  Take medicines only as directed by your health care provider.  Keep all follow-up visits as directed by your health care provider. SEEK MEDICAL CARE IF:   You notice red streaks coming from the infected area.  Your red area gets larger or turns dark in color.  Your bone or joint underneath the infected area becomes painful after the skin has healed.  Your infection returns in the same area or another area.  You notice a swollen bump in the infected area.  You develop new symptoms.  You have a fever. SEEK IMMEDIATE MEDICAL CARE IF:   You feel very sleepy.  You develop vomiting or diarrhea.  You have a general ill feeling (malaise) with muscle aches and pains.   This information is not intended to replace advice given to you by your health care provider. Make sure you discuss any questions you have with your health care provider.   Document Released: 01/23/2005 Document Revised: 01/04/2015 Document Reviewed: 07/01/2011 Elsevier Interactive Patient Education [employer].

## 2015-02-10 NOTE — ED Notes (Signed)
Pt here for right foot pain. sts it got hung up in her wheelchair. Pt right foot swollen.

## 2015-02-10 NOTE — Progress Notes (Signed)
VASCULAR LAB PRELIMINARY  PRELIMINARY  PRELIMINARY  PRELIMINARY  Right lower extremity venous duplex completed.    Preliminary report:  Right:  No evidence of DVT, superficial thrombosis, or Baker's cyst.  Michele Kerlin, RVS 02/10/2015, 7:43 PM

## 2015-02-10 NOTE — ED Notes (Signed)
Spoke with vascular tech, aware of pt.

## 2015-02-10 NOTE — ED Provider Notes (Signed)
CSN: [id number]     Arrival date & time 02/10/15  1531 History  By signing my name below, I, Emmanuella Mensah, attest that this documentation has been prepared under the direction and in the presence of Margarita Mail, PA-C. Electronically Signed: Judithann Sauger, ED Scribe. 02/10/2015. 4:42 PM.    Chief Complaint  Patient presents with  . Foot Pain   The history is provided by the patient. No language interpreter was used.   HPI Comments: [name] [name] is a 64 y.o. female with a hx of HTN and stroke ([place]) who presents to the Emergency Department status post foot injury that occurred 2 nights ago when her foot was caught in her wheelchair. Pt is un sure how it happened but now she reports associated pain and swelling to the top and bottm of the right foot. She reports that she uses the wheelchair in the house for using the bathroom during the night. She denies a hx of PE or DVT.   Past Medical History  Diagnosis Date  . Stroke ([place])   . Thyroid disease   . Hypertension   . MI (myocardial infarction) ([place])    History reviewed. No pertinent past surgical history. History reviewed. No pertinent family history. Social History  Substance Use Topics  . Smoking status: Never Smoker   . Smokeless tobacco: None  . Alcohol Use: No   OB History    No data available     Review of Systems  Constitutional: Negative for fever.  Musculoskeletal: Positive for joint swelling and arthralgias.      Allergies  Sulfonamide derivatives  Home Medications   Prior to Admission medications   Medication Sig Start Date End Date Taking? Authorizing Provider  albuterol (PROVENTIL HFA;VENTOLIN HFA) 108 (90 BASE) MCG/ACT inhaler Inhale 2 puffs into the lungs every 6 (six) hours as needed for wheezing or shortness of breath. 06/21/14   Reyne Dumas, MD  azithromycin (ZITHROMAX) 500 MG tablet Take 1 tablet (500 mg total) by mouth daily. 06/21/14   Reyne Dumas, MD  budesonide-formoterol (SYMBICORT)  160-4.5 MCG/ACT inhaler Inhale 2 puffs into the lungs 2 (two) times daily.    Historical Provider, MD  CALCIUM PO Take 1 tablet by mouth daily.    Historical Provider, MD  cholecalciferol (VITAMIN D) 1000 UNITS tablet Take 2,000 Units by mouth daily.    Historical Provider, MD  Cyanocobalamin (VITAMIN B-12 PO) Take 2,000 mg by mouth daily.    Historical Provider, MD  diphenhydramine-acetaminophen (TYLENOL PM) 25-500 MG TABS Take 2 tablets by mouth at bedtime as needed (pain/sleep).    Historical Provider, MD  docusate sodium (COLACE) 100 MG capsule Take 200 mg by mouth 2 (two) times daily as needed for mild constipation.    Historical Provider, MD  fluticasone (FLONASE) 50 MCG/ACT nasal spray Place 2 sprays into both nostrils daily as needed for allergies or rhinitis.    Historical Provider, MD  furosemide (LASIX) 40 MG tablet Take 40 mg by mouth daily.    Historical Provider, MD  HYDROcodone-homatropine (HYCODAN) 5-1.5 MG/5ML syrup Take 5 mLs by mouth every 6 (six) hours as needed for cough. 06/21/14   Reyne Dumas, MD  levothyroxine (SYNTHROID, LEVOTHROID) 112 MCG tablet Take 112 mcg by mouth daily before breakfast.    Historical Provider, MD  losartan-hydrochlorothiazide (HYZAAR) 100-25 MG per tablet Take 1 tablet by mouth daily. 06/27/14   Reyne Dumas, MD  omega-3 acid ethyl esters (LOVAZA) 1 G capsule Take 4 g by mouth daily.  Historical Provider, MD  omeprazole (PRILOSEC) 20 MG capsule Take 20 mg by mouth daily.    Historical Provider, MD  oseltamivir (TAMIFLU) 75 MG capsule Take 1 capsule (75 mg total) by mouth 2 (two) times daily. 06/21/14   Reyne Dumas, MD  potassium chloride SA (K-DUR,KLOR-CON) 20 MEQ tablet Take 40 mEq by mouth daily.    Historical Provider, MD  PRESCRIPTION MEDICATION Take 1 tablet by mouth daily. Depression pill    Historical Provider, MD   BP 140/63 mmHg  Pulse 77  Temp(Src) 98.3 F (36.8 C) (Oral)  Resp 20  SpO2 96% Physical Exam  Constitutional: She is  oriented to person, place, and time. She appears well-developed and well-nourished. No distress.  HENT:  Head: Normocephalic and atraumatic.  Eyes: Conjunctivae and EOM are normal.  Neck: Neck supple. No tracheal deviation present.  Cardiovascular: Normal rate.   Pulmonary/Chest: Effort normal. No respiratory distress.  Musculoskeletal: Normal range of motion. She exhibits edema and tenderness.  Swelling, heat and redness on the plantar and dorsal surface from mid to top of foot.  2,3,4 the toes are the rest.   Neurological: She is alert and oriented to person, place, and time.  Skin: Skin is warm and dry.  Psychiatric: She has a normal mood and affect. Her behavior is normal.  Nursing note and vitals reviewed.   ED Course  Procedures (including critical care time) DIAGNOSTIC STUDIES: Oxygen Saturation is 96% on RA, normal by my interpretation.    COORDINATION OF CARE: 4:18 PM- Pt advised of plan for treatment and pt agrees. Will receive an X-ray of her right foot.  4:41 PM - Pt will receive a DVT study.    Labs Review Labs Reviewed - No data to display  Imaging Review No results found.   Margarita Mail, PA-C has personally reviewed and evaluated these images as part of her medical decision-making.   EKG Interpretation None      MDM   Final diagnoses:  None    Patient negative x-ray, negative DVT study. Seen in shared visit with attending physician. Patient's right foot is warm and red. Will treat for presumed cellulitis. Pain medications given. Rest, ice, compress and elevate the foot. Follow up with PCP in 2-3 days for foot recheck. Appears safe for discharge at this time.  Ulla Potash, personally performed the services described in this documentation. All medical record entries made by the scribe were at my direction and in my presence.  I have reviewed the chart and discharge instructions and agree that the record reflects my personal performance and is  accurate and complete. Margarita Mail.  02/11/2015. 12:13 AM.       Margarita Mail, PA-C 02/11/15 0013  Jola Schmidt, MD 02/11/15 [phone]

## 2015-02-23 DIAGNOSIS — Z79899 Other long term (current) drug therapy: Secondary | ICD-10-CM | POA: Diagnosis not present

## 2015-02-23 DIAGNOSIS — E559 Vitamin D deficiency, unspecified: Secondary | ICD-10-CM | POA: Diagnosis not present

## 2015-02-23 DIAGNOSIS — E785 Hyperlipidemia, unspecified: Secondary | ICD-10-CM | POA: Diagnosis not present

## 2015-02-23 DIAGNOSIS — F39 Unspecified mood [affective] disorder: Secondary | ICD-10-CM | POA: Diagnosis not present

## 2015-02-23 DIAGNOSIS — I10 Essential (primary) hypertension: Secondary | ICD-10-CM | POA: Diagnosis not present

## 2015-02-23 DIAGNOSIS — E039 Hypothyroidism, unspecified: Secondary | ICD-10-CM | POA: Diagnosis not present

## 2015-02-23 DIAGNOSIS — Z1389 Encounter for screening for other disorder: Secondary | ICD-10-CM | POA: Diagnosis not present

## 2015-02-23 DIAGNOSIS — Z131 Encounter for screening for diabetes mellitus: Secondary | ICD-10-CM | POA: Diagnosis not present

## 2015-02-23 DIAGNOSIS — Z23 Encounter for immunization: Secondary | ICD-10-CM | POA: Diagnosis not present

## 2015-03-07 DIAGNOSIS — E876 Hypokalemia: Secondary | ICD-10-CM | POA: Diagnosis not present

## 2015-06-26 DIAGNOSIS — J309 Allergic rhinitis, unspecified: Secondary | ICD-10-CM | POA: Diagnosis not present

## 2015-06-26 DIAGNOSIS — R600 Localized edema: Secondary | ICD-10-CM | POA: Diagnosis not present

## 2015-06-26 DIAGNOSIS — E039 Hypothyroidism, unspecified: Secondary | ICD-10-CM | POA: Diagnosis not present

## 2015-06-26 DIAGNOSIS — I10 Essential (primary) hypertension: Secondary | ICD-10-CM | POA: Diagnosis not present

## 2015-06-26 DIAGNOSIS — J45909 Unspecified asthma, uncomplicated: Secondary | ICD-10-CM | POA: Diagnosis not present

## 2015-08-03 DIAGNOSIS — H25813 Combined forms of age-related cataract, bilateral: Secondary | ICD-10-CM | POA: Diagnosis not present

## 2015-08-30 DIAGNOSIS — E039 Hypothyroidism, unspecified: Secondary | ICD-10-CM | POA: Diagnosis not present

## 2015-08-30 DIAGNOSIS — E785 Hyperlipidemia, unspecified: Secondary | ICD-10-CM | POA: Diagnosis not present

## 2015-08-30 DIAGNOSIS — I10 Essential (primary) hypertension: Secondary | ICD-10-CM | POA: Diagnosis not present

## 2015-08-30 DIAGNOSIS — E119 Type 2 diabetes mellitus without complications: Secondary | ICD-10-CM | POA: Diagnosis not present

## 2015-08-30 DIAGNOSIS — Z0000 Encounter for general adult medical examination without abnormal findings: Secondary | ICD-10-CM | POA: Diagnosis not present

## 2015-08-30 DIAGNOSIS — E559 Vitamin D deficiency, unspecified: Secondary | ICD-10-CM | POA: Diagnosis not present

## 2015-09-05 DIAGNOSIS — Z1231 Encounter for screening mammogram for malignant neoplasm of breast: Secondary | ICD-10-CM | POA: Diagnosis not present

## 2015-11-27 DIAGNOSIS — K219 Gastro-esophageal reflux disease without esophagitis: Secondary | ICD-10-CM | POA: Diagnosis not present

## 2015-11-27 DIAGNOSIS — J309 Allergic rhinitis, unspecified: Secondary | ICD-10-CM | POA: Diagnosis not present

## 2015-11-27 DIAGNOSIS — R51 Headache: Secondary | ICD-10-CM | POA: Diagnosis not present

## 2016-01-03 DIAGNOSIS — R079 Chest pain, unspecified: Secondary | ICD-10-CM | POA: Diagnosis not present

## 2016-01-03 DIAGNOSIS — I10 Essential (primary) hypertension: Secondary | ICD-10-CM | POA: Diagnosis not present

## 2016-01-03 DIAGNOSIS — E119 Type 2 diabetes mellitus without complications: Secondary | ICD-10-CM | POA: Diagnosis not present

## 2016-01-03 DIAGNOSIS — I2510 Atherosclerotic heart disease of native coronary artery without angina pectoris: Secondary | ICD-10-CM | POA: Diagnosis not present

## 2016-01-03 DIAGNOSIS — I480 Paroxysmal atrial fibrillation: Secondary | ICD-10-CM | POA: Diagnosis not present

## 2016-01-04 DIAGNOSIS — E039 Hypothyroidism, unspecified: Secondary | ICD-10-CM | POA: Diagnosis not present

## 2016-01-04 DIAGNOSIS — E559 Vitamin D deficiency, unspecified: Secondary | ICD-10-CM | POA: Diagnosis not present

## 2016-01-04 DIAGNOSIS — E119 Type 2 diabetes mellitus without complications: Secondary | ICD-10-CM | POA: Diagnosis not present

## 2016-01-04 DIAGNOSIS — Z9181 History of falling: Secondary | ICD-10-CM | POA: Diagnosis not present

## 2016-01-04 DIAGNOSIS — E785 Hyperlipidemia, unspecified: Secondary | ICD-10-CM | POA: Diagnosis not present

## 2016-01-04 DIAGNOSIS — Z79899 Other long term (current) drug therapy: Secondary | ICD-10-CM | POA: Diagnosis not present

## 2016-01-04 DIAGNOSIS — I10 Essential (primary) hypertension: Secondary | ICD-10-CM | POA: Diagnosis not present

## 2016-01-15 DIAGNOSIS — I2510 Atherosclerotic heart disease of native coronary artery without angina pectoris: Secondary | ICD-10-CM | POA: Diagnosis not present

## 2016-01-16 DIAGNOSIS — I2510 Atherosclerotic heart disease of native coronary artery without angina pectoris: Secondary | ICD-10-CM | POA: Diagnosis not present

## 2016-02-03 DIAGNOSIS — M79604 Pain in right leg: Secondary | ICD-10-CM | POA: Diagnosis not present

## 2016-02-03 DIAGNOSIS — M10071 Idiopathic gout, right ankle and foot: Secondary | ICD-10-CM | POA: Diagnosis not present

## 2016-02-03 DIAGNOSIS — M7989 Other specified soft tissue disorders: Secondary | ICD-10-CM | POA: Diagnosis not present

## 2016-02-03 DIAGNOSIS — M109 Gout, unspecified: Secondary | ICD-10-CM | POA: Diagnosis not present

## 2016-02-03 DIAGNOSIS — Z8679 Personal history of other diseases of the circulatory system: Secondary | ICD-10-CM | POA: Diagnosis not present

## 2016-02-03 DIAGNOSIS — Z7901 Long term (current) use of anticoagulants: Secondary | ICD-10-CM | POA: Diagnosis not present

## 2016-02-04 DIAGNOSIS — M7989 Other specified soft tissue disorders: Secondary | ICD-10-CM | POA: Diagnosis not present

## 2016-02-04 DIAGNOSIS — M79604 Pain in right leg: Secondary | ICD-10-CM | POA: Diagnosis not present

## 2016-02-24 DIAGNOSIS — M79671 Pain in right foot: Secondary | ICD-10-CM | POA: Diagnosis not present

## 2016-02-24 DIAGNOSIS — M10071 Idiopathic gout, right ankle and foot: Secondary | ICD-10-CM | POA: Diagnosis not present

## 2016-03-28 DIAGNOSIS — M25551 Pain in right hip: Secondary | ICD-10-CM | POA: Diagnosis not present

## 2016-03-28 DIAGNOSIS — J309 Allergic rhinitis, unspecified: Secondary | ICD-10-CM | POA: Diagnosis not present

## 2016-03-28 DIAGNOSIS — Z1389 Encounter for screening for other disorder: Secondary | ICD-10-CM | POA: Diagnosis not present

## 2016-03-28 DIAGNOSIS — Z139 Encounter for screening, unspecified: Secondary | ICD-10-CM | POA: Diagnosis not present

## 2016-04-03 DIAGNOSIS — I480 Paroxysmal atrial fibrillation: Secondary | ICD-10-CM | POA: Diagnosis not present

## 2016-04-03 DIAGNOSIS — I10 Essential (primary) hypertension: Secondary | ICD-10-CM | POA: Diagnosis not present

## 2016-04-11 DIAGNOSIS — M8589 Other specified disorders of bone density and structure, multiple sites: Secondary | ICD-10-CM | POA: Diagnosis not present

## 2016-04-11 DIAGNOSIS — Z13820 Encounter for screening for osteoporosis: Secondary | ICD-10-CM | POA: Diagnosis not present

## 2016-04-25 DIAGNOSIS — S335XXA Sprain of ligaments of lumbar spine, initial encounter: Secondary | ICD-10-CM | POA: Diagnosis not present

## 2016-04-25 DIAGNOSIS — S29012A Strain of muscle and tendon of back wall of thorax, initial encounter: Secondary | ICD-10-CM | POA: Diagnosis not present

## 2016-04-25 DIAGNOSIS — M5416 Radiculopathy, lumbar region: Secondary | ICD-10-CM | POA: Diagnosis not present

## 2016-05-06 DIAGNOSIS — M25551 Pain in right hip: Secondary | ICD-10-CM | POA: Diagnosis not present

## 2016-05-06 DIAGNOSIS — M5440 Lumbago with sciatica, unspecified side: Secondary | ICD-10-CM | POA: Diagnosis not present

## 2016-05-06 DIAGNOSIS — Z09 Encounter for follow-up examination after completed treatment for conditions other than malignant neoplasm: Secondary | ICD-10-CM | POA: Diagnosis not present

## 2016-05-14 DIAGNOSIS — M79651 Pain in right thigh: Secondary | ICD-10-CM | POA: Diagnosis not present

## 2016-05-14 DIAGNOSIS — M2560 Stiffness of unspecified joint, not elsewhere classified: Secondary | ICD-10-CM | POA: Diagnosis not present

## 2016-05-14 DIAGNOSIS — M79652 Pain in left thigh: Secondary | ICD-10-CM | POA: Diagnosis not present

## 2016-05-14 DIAGNOSIS — M6281 Muscle weakness (generalized): Secondary | ICD-10-CM | POA: Diagnosis not present

## 2016-05-14 DIAGNOSIS — R293 Abnormal posture: Secondary | ICD-10-CM | POA: Diagnosis not present

## 2016-05-14 DIAGNOSIS — R2689 Other abnormalities of gait and mobility: Secondary | ICD-10-CM | POA: Diagnosis not present

## 2016-05-14 DIAGNOSIS — M5440 Lumbago with sciatica, unspecified side: Secondary | ICD-10-CM | POA: Diagnosis not present

## 2016-05-20 DIAGNOSIS — M2560 Stiffness of unspecified joint, not elsewhere classified: Secondary | ICD-10-CM | POA: Diagnosis not present

## 2016-05-20 DIAGNOSIS — R293 Abnormal posture: Secondary | ICD-10-CM | POA: Diagnosis not present

## 2016-05-20 DIAGNOSIS — M6281 Muscle weakness (generalized): Secondary | ICD-10-CM | POA: Diagnosis not present

## 2016-05-20 DIAGNOSIS — M5440 Lumbago with sciatica, unspecified side: Secondary | ICD-10-CM | POA: Diagnosis not present

## 2016-05-20 DIAGNOSIS — M79651 Pain in right thigh: Secondary | ICD-10-CM | POA: Diagnosis not present

## 2016-05-20 DIAGNOSIS — M79652 Pain in left thigh: Secondary | ICD-10-CM | POA: Diagnosis not present

## 2016-05-20 DIAGNOSIS — R2689 Other abnormalities of gait and mobility: Secondary | ICD-10-CM | POA: Diagnosis not present

## 2016-05-23 DIAGNOSIS — R293 Abnormal posture: Secondary | ICD-10-CM | POA: Diagnosis not present

## 2016-05-23 DIAGNOSIS — R2689 Other abnormalities of gait and mobility: Secondary | ICD-10-CM | POA: Diagnosis not present

## 2016-05-23 DIAGNOSIS — M79652 Pain in left thigh: Secondary | ICD-10-CM | POA: Diagnosis not present

## 2016-05-23 DIAGNOSIS — M6281 Muscle weakness (generalized): Secondary | ICD-10-CM | POA: Diagnosis not present

## 2016-05-23 DIAGNOSIS — M2560 Stiffness of unspecified joint, not elsewhere classified: Secondary | ICD-10-CM | POA: Diagnosis not present

## 2016-05-23 DIAGNOSIS — M79651 Pain in right thigh: Secondary | ICD-10-CM | POA: Diagnosis not present

## 2016-05-23 DIAGNOSIS — M5440 Lumbago with sciatica, unspecified side: Secondary | ICD-10-CM | POA: Diagnosis not present

## 2016-05-29 DIAGNOSIS — J309 Allergic rhinitis, unspecified: Secondary | ICD-10-CM | POA: Diagnosis not present

## 2016-05-29 DIAGNOSIS — M5440 Lumbago with sciatica, unspecified side: Secondary | ICD-10-CM | POA: Diagnosis not present

## 2016-05-29 DIAGNOSIS — M109 Gout, unspecified: Secondary | ICD-10-CM | POA: Diagnosis not present

## 2016-05-30 DIAGNOSIS — M6281 Muscle weakness (generalized): Secondary | ICD-10-CM | POA: Diagnosis not present

## 2016-05-30 DIAGNOSIS — M79652 Pain in left thigh: Secondary | ICD-10-CM | POA: Diagnosis not present

## 2016-05-30 DIAGNOSIS — M2560 Stiffness of unspecified joint, not elsewhere classified: Secondary | ICD-10-CM | POA: Diagnosis not present

## 2016-05-30 DIAGNOSIS — M79651 Pain in right thigh: Secondary | ICD-10-CM | POA: Diagnosis not present

## 2016-05-30 DIAGNOSIS — R293 Abnormal posture: Secondary | ICD-10-CM | POA: Diagnosis not present

## 2016-05-30 DIAGNOSIS — R2689 Other abnormalities of gait and mobility: Secondary | ICD-10-CM | POA: Diagnosis not present

## 2016-05-30 DIAGNOSIS — M5440 Lumbago with sciatica, unspecified side: Secondary | ICD-10-CM | POA: Diagnosis not present

## 2016-06-05 DIAGNOSIS — M2560 Stiffness of unspecified joint, not elsewhere classified: Secondary | ICD-10-CM | POA: Diagnosis not present

## 2016-06-05 DIAGNOSIS — M79652 Pain in left thigh: Secondary | ICD-10-CM | POA: Diagnosis not present

## 2016-06-05 DIAGNOSIS — R293 Abnormal posture: Secondary | ICD-10-CM | POA: Diagnosis not present

## 2016-06-05 DIAGNOSIS — M5440 Lumbago with sciatica, unspecified side: Secondary | ICD-10-CM | POA: Diagnosis not present

## 2016-06-05 DIAGNOSIS — R2689 Other abnormalities of gait and mobility: Secondary | ICD-10-CM | POA: Diagnosis not present

## 2016-06-05 DIAGNOSIS — M79651 Pain in right thigh: Secondary | ICD-10-CM | POA: Diagnosis not present

## 2016-06-05 DIAGNOSIS — M6281 Muscle weakness (generalized): Secondary | ICD-10-CM | POA: Diagnosis not present

## 2016-06-07 DIAGNOSIS — M5440 Lumbago with sciatica, unspecified side: Secondary | ICD-10-CM | POA: Diagnosis not present

## 2016-06-07 DIAGNOSIS — R2689 Other abnormalities of gait and mobility: Secondary | ICD-10-CM | POA: Diagnosis not present

## 2016-06-07 DIAGNOSIS — R293 Abnormal posture: Secondary | ICD-10-CM | POA: Diagnosis not present

## 2016-06-07 DIAGNOSIS — M6281 Muscle weakness (generalized): Secondary | ICD-10-CM | POA: Diagnosis not present

## 2016-06-07 DIAGNOSIS — M79651 Pain in right thigh: Secondary | ICD-10-CM | POA: Diagnosis not present

## 2016-06-07 DIAGNOSIS — M2560 Stiffness of unspecified joint, not elsewhere classified: Secondary | ICD-10-CM | POA: Diagnosis not present

## 2016-06-07 DIAGNOSIS — M79652 Pain in left thigh: Secondary | ICD-10-CM | POA: Diagnosis not present

## 2016-06-12 DIAGNOSIS — M2560 Stiffness of unspecified joint, not elsewhere classified: Secondary | ICD-10-CM | POA: Diagnosis not present

## 2016-06-12 DIAGNOSIS — M6281 Muscle weakness (generalized): Secondary | ICD-10-CM | POA: Diagnosis not present

## 2016-06-12 DIAGNOSIS — R2689 Other abnormalities of gait and mobility: Secondary | ICD-10-CM | POA: Diagnosis not present

## 2016-06-12 DIAGNOSIS — M79651 Pain in right thigh: Secondary | ICD-10-CM | POA: Diagnosis not present

## 2016-06-12 DIAGNOSIS — R293 Abnormal posture: Secondary | ICD-10-CM | POA: Diagnosis not present

## 2016-06-12 DIAGNOSIS — M5440 Lumbago with sciatica, unspecified side: Secondary | ICD-10-CM | POA: Diagnosis not present

## 2016-06-12 DIAGNOSIS — M79652 Pain in left thigh: Secondary | ICD-10-CM | POA: Diagnosis not present

## 2016-06-17 DIAGNOSIS — M5441 Lumbago with sciatica, right side: Secondary | ICD-10-CM | POA: Diagnosis not present

## 2016-06-18 DIAGNOSIS — R609 Edema, unspecified: Secondary | ICD-10-CM | POA: Diagnosis not present

## 2016-06-18 DIAGNOSIS — R0602 Shortness of breath: Secondary | ICD-10-CM | POA: Diagnosis not present

## 2016-06-18 DIAGNOSIS — R600 Localized edema: Secondary | ICD-10-CM | POA: Diagnosis not present

## 2016-06-21 DIAGNOSIS — M79651 Pain in right thigh: Secondary | ICD-10-CM | POA: Diagnosis not present

## 2016-06-21 DIAGNOSIS — M79652 Pain in left thigh: Secondary | ICD-10-CM | POA: Diagnosis not present

## 2016-06-21 DIAGNOSIS — R2689 Other abnormalities of gait and mobility: Secondary | ICD-10-CM | POA: Diagnosis not present

## 2016-06-21 DIAGNOSIS — M6281 Muscle weakness (generalized): Secondary | ICD-10-CM | POA: Diagnosis not present

## 2016-06-21 DIAGNOSIS — R293 Abnormal posture: Secondary | ICD-10-CM | POA: Diagnosis not present

## 2016-06-21 DIAGNOSIS — M5440 Lumbago with sciatica, unspecified side: Secondary | ICD-10-CM | POA: Diagnosis not present

## 2016-06-21 DIAGNOSIS — M2560 Stiffness of unspecified joint, not elsewhere classified: Secondary | ICD-10-CM | POA: Diagnosis not present

## 2016-06-27 DIAGNOSIS — M79651 Pain in right thigh: Secondary | ICD-10-CM | POA: Diagnosis not present

## 2016-06-27 DIAGNOSIS — M6281 Muscle weakness (generalized): Secondary | ICD-10-CM | POA: Diagnosis not present

## 2016-06-27 DIAGNOSIS — R293 Abnormal posture: Secondary | ICD-10-CM | POA: Diagnosis not present

## 2016-06-27 DIAGNOSIS — M2560 Stiffness of unspecified joint, not elsewhere classified: Secondary | ICD-10-CM | POA: Diagnosis not present

## 2016-06-27 DIAGNOSIS — M79652 Pain in left thigh: Secondary | ICD-10-CM | POA: Diagnosis not present

## 2016-06-27 DIAGNOSIS — R2689 Other abnormalities of gait and mobility: Secondary | ICD-10-CM | POA: Diagnosis not present

## 2016-06-27 DIAGNOSIS — M5440 Lumbago with sciatica, unspecified side: Secondary | ICD-10-CM | POA: Diagnosis not present

## 2016-06-27 DIAGNOSIS — M545 Low back pain: Secondary | ICD-10-CM | POA: Diagnosis not present

## 2016-06-28 DIAGNOSIS — M5441 Lumbago with sciatica, right side: Secondary | ICD-10-CM | POA: Diagnosis not present

## 2016-07-05 DIAGNOSIS — M6281 Muscle weakness (generalized): Secondary | ICD-10-CM | POA: Diagnosis not present

## 2016-07-05 DIAGNOSIS — M79651 Pain in right thigh: Secondary | ICD-10-CM | POA: Diagnosis not present

## 2016-07-05 DIAGNOSIS — R2689 Other abnormalities of gait and mobility: Secondary | ICD-10-CM | POA: Diagnosis not present

## 2016-07-05 DIAGNOSIS — M5440 Lumbago with sciatica, unspecified side: Secondary | ICD-10-CM | POA: Diagnosis not present

## 2016-07-05 DIAGNOSIS — R293 Abnormal posture: Secondary | ICD-10-CM | POA: Diagnosis not present

## 2016-07-05 DIAGNOSIS — M79652 Pain in left thigh: Secondary | ICD-10-CM | POA: Diagnosis not present

## 2016-07-05 DIAGNOSIS — M545 Low back pain: Secondary | ICD-10-CM | POA: Diagnosis not present

## 2016-07-05 DIAGNOSIS — M2560 Stiffness of unspecified joint, not elsewhere classified: Secondary | ICD-10-CM | POA: Diagnosis not present

## 2016-07-08 DIAGNOSIS — M5441 Lumbago with sciatica, right side: Secondary | ICD-10-CM | POA: Diagnosis not present

## 2016-07-10 DIAGNOSIS — M2560 Stiffness of unspecified joint, not elsewhere classified: Secondary | ICD-10-CM | POA: Diagnosis not present

## 2016-07-10 DIAGNOSIS — M5440 Lumbago with sciatica, unspecified side: Secondary | ICD-10-CM | POA: Diagnosis not present

## 2016-07-10 DIAGNOSIS — R293 Abnormal posture: Secondary | ICD-10-CM | POA: Diagnosis not present

## 2016-07-10 DIAGNOSIS — R2689 Other abnormalities of gait and mobility: Secondary | ICD-10-CM | POA: Diagnosis not present

## 2016-07-10 DIAGNOSIS — M545 Low back pain: Secondary | ICD-10-CM | POA: Diagnosis not present

## 2016-07-10 DIAGNOSIS — M6281 Muscle weakness (generalized): Secondary | ICD-10-CM | POA: Diagnosis not present

## 2016-07-10 DIAGNOSIS — M79651 Pain in right thigh: Secondary | ICD-10-CM | POA: Diagnosis not present

## 2016-07-10 DIAGNOSIS — M79652 Pain in left thigh: Secondary | ICD-10-CM | POA: Diagnosis not present

## 2016-07-12 DIAGNOSIS — M5440 Lumbago with sciatica, unspecified side: Secondary | ICD-10-CM | POA: Diagnosis not present

## 2016-07-12 DIAGNOSIS — M2560 Stiffness of unspecified joint, not elsewhere classified: Secondary | ICD-10-CM | POA: Diagnosis not present

## 2016-07-12 DIAGNOSIS — M545 Low back pain: Secondary | ICD-10-CM | POA: Diagnosis not present

## 2016-07-12 DIAGNOSIS — R2689 Other abnormalities of gait and mobility: Secondary | ICD-10-CM | POA: Diagnosis not present

## 2016-07-12 DIAGNOSIS — M79652 Pain in left thigh: Secondary | ICD-10-CM | POA: Diagnosis not present

## 2016-07-12 DIAGNOSIS — M6281 Muscle weakness (generalized): Secondary | ICD-10-CM | POA: Diagnosis not present

## 2016-07-12 DIAGNOSIS — R293 Abnormal posture: Secondary | ICD-10-CM | POA: Diagnosis not present

## 2016-07-12 DIAGNOSIS — M79651 Pain in right thigh: Secondary | ICD-10-CM | POA: Diagnosis not present

## 2016-07-16 DIAGNOSIS — R6889 Other general symptoms and signs: Secondary | ICD-10-CM | POA: Diagnosis not present

## 2016-07-21 DIAGNOSIS — R609 Edema, unspecified: Secondary | ICD-10-CM | POA: Diagnosis not present

## 2016-07-21 DIAGNOSIS — R600 Localized edema: Secondary | ICD-10-CM | POA: Diagnosis not present

## 2016-07-23 DIAGNOSIS — E559 Vitamin D deficiency, unspecified: Secondary | ICD-10-CM | POA: Diagnosis not present

## 2016-07-23 DIAGNOSIS — Z01818 Encounter for other preprocedural examination: Secondary | ICD-10-CM | POA: Diagnosis not present

## 2016-07-23 DIAGNOSIS — J309 Allergic rhinitis, unspecified: Secondary | ICD-10-CM | POA: Diagnosis not present

## 2016-07-23 DIAGNOSIS — I10 Essential (primary) hypertension: Secondary | ICD-10-CM | POA: Diagnosis not present

## 2016-07-23 DIAGNOSIS — M5440 Lumbago with sciatica, unspecified side: Secondary | ICD-10-CM | POA: Diagnosis not present

## 2016-08-21 DIAGNOSIS — Z01810 Encounter for preprocedural cardiovascular examination: Secondary | ICD-10-CM | POA: Diagnosis not present

## 2016-08-21 DIAGNOSIS — I10 Essential (primary) hypertension: Secondary | ICD-10-CM | POA: Diagnosis not present

## 2016-08-21 DIAGNOSIS — I480 Paroxysmal atrial fibrillation: Secondary | ICD-10-CM | POA: Diagnosis not present

## 2016-08-26 DIAGNOSIS — M79672 Pain in left foot: Secondary | ICD-10-CM | POA: Diagnosis not present

## 2016-08-26 DIAGNOSIS — R600 Localized edema: Secondary | ICD-10-CM | POA: Diagnosis not present

## 2016-08-26 DIAGNOSIS — L603 Nail dystrophy: Secondary | ICD-10-CM | POA: Diagnosis not present

## 2016-08-26 DIAGNOSIS — M79671 Pain in right foot: Secondary | ICD-10-CM | POA: Diagnosis not present

## 2016-08-27 DIAGNOSIS — I10 Essential (primary) hypertension: Secondary | ICD-10-CM | POA: Diagnosis not present

## 2016-08-27 DIAGNOSIS — I480 Paroxysmal atrial fibrillation: Secondary | ICD-10-CM | POA: Diagnosis not present

## 2016-08-27 DIAGNOSIS — Z01810 Encounter for preprocedural cardiovascular examination: Secondary | ICD-10-CM | POA: Diagnosis not present

## 2016-10-17 ENCOUNTER — Other Ambulatory Visit (HOSPITAL_COMMUNITY): Payer: Self-pay | Admitting: Emergency Medicine

## 2016-10-17 NOTE — Patient Instructions (Signed)
[name] [name]  10/17/2016   Your procedure is scheduled on: 10-23-16  Report to [facility] Main  Entrance Take [place]  elevators to 3rd floor to  [place] at 530AM.   Call this number if you have problems the morning of surgery [phone]    Remember: ONLY 1 PERSON MAY GO WITH YOU TO SHORT STAY TO GET  READY MORNING OF YOUR SURGERY.  Do not eat food or drink liquids :After Midnight.     Take these medicines the morning of surgery with A SIP OF WATER: synthroid, omeprazole(prilosec), venlafaxine(effexor), oxycodone(percocet) as needed, inhalers as needed (may bring to the hospital with you)                                 You may not have any metal on your body including hair pins and              piercings  Do not wear jewelry, make-up, lotions, powders or perfumes, deodorant             Do not wear nail polish.  Do not shave  48 hours prior to surgery.       Do not bring valuables to the hospital. [place].  Contacts, dentures or bridgework may not be worn into surgery.  Leave suitcase in the car. After surgery it may be brought to your room.               Please read over the following fact sheets you were given: _____________________________________________________________________            How to Manage Your Diabetes Before and After Surgery  Why is it important to control my blood sugar before and after surgery? . Improving blood sugar levels before and after surgery helps healing and can limit problems. . A way of improving blood sugar control is eating a healthy diet by: o  Eating less sugar and carbohydrates o  Increasing activity/exercise o  Talking with your doctor about reaching your blood sugar goals . High blood sugars (greater than 180 mg/dL) can raise your risk of infections and slow your recovery, so you will need to focus on controlling your diabetes during the weeks before  surgery. . Make sure that the doctor who takes care of your diabetes knows about your planned surgery including the date and location.   Patient Signature:  Date:   Nurse Signature:  Date:   Reviewed and Endorsed by [facility] Patient Education Committee, August 2015  [facility] - Preparing for Surgery Before surgery, you can play an important role.  Because skin is not sterile, your skin needs to be as free of germs as possible.  You can reduce the number of germs on your skin by washing with CHG (chlorahexidine gluconate) soap before surgery.  CHG is an antiseptic cleaner which kills germs and bonds with the skin to continue killing germs even after washing. Please DO NOT use if you have an allergy to CHG or antibacterial soaps.  If your skin becomes reddened/irritated stop using the CHG and inform your nurse when you arrive at Short Stay. Do not shave (including legs and underarms) for at least 48 hours prior to the first  CHG shower.  You may shave your face/neck. Please follow these instructions carefully:  1.  Shower with CHG Soap the night before surgery and the  morning of Surgery.  2.  If you choose to wash your hair, wash your hair first as usual with your  normal  shampoo.  3.  After you shampoo, rinse your hair and body thoroughly to remove the  shampoo.                           4.  Use CHG as you would any other liquid soap.  You can apply chg directly  to the skin and wash                       Gently with a scrungie or clean washcloth.  5.  Apply the CHG Soap to your body ONLY FROM THE NECK DOWN.   Do not use on face/ open                           Wound or open sores. Avoid contact with eyes, ears mouth and genitals (private parts).                       Wash face,  Genitals (private parts) with your normal soap.             6.  Wash thoroughly, paying special attention to the area where your surgery  will be performed.  7.  Thoroughly rinse your body with warm water from the  neck down.  8.  DO NOT shower/wash with your normal soap after using and rinsing off  the CHG Soap.                9.  Pat yourself dry with a clean towel.            10.  Wear clean pajamas.            11.  Place clean sheets on your bed the night of your first shower and do not  sleep with pets. Day of Surgery : Do not apply any lotions/deodorants the morning of surgery.  Please wear clean clothes to the hospital/surgery center.  FAILURE TO FOLLOW THESE INSTRUCTIONS MAY RESULT IN THE CANCELLATION OF YOUR SURGERY PATIENT SIGNATURE_________________________________  NURSE SIGNATURE__________________________________  ________________________________________________________________________   [name] [name]  An incentive spirometer is a tool that can help keep your lungs clear and active. This tool measures how well you are filling your lungs with each breath. Taking long deep breaths may help reverse or decrease the chance of developing breathing (pulmonary) problems (especially infection) following:  A long period of time when you are unable to move or be active. BEFORE THE PROCEDURE   If the spirometer includes an indicator to show your best effort, your nurse or respiratory therapist will set it to a desired goal.  If possible, sit up straight or lean slightly forward. Try not to slouch.  Hold the incentive spirometer in an upright position. INSTRUCTIONS FOR USE  1. Sit on the edge of your bed if possible, or sit up as far as you can in bed or on a chair. 2. Hold the incentive spirometer in an upright position. 3. Breathe out normally. 4. Place the mouthpiece in your mouth and seal your lips tightly around it. 5. Breathe in slowly and as deeply  as possible, raising the piston or the ball toward the top of the column. 6. Hold your breath for 3-5 seconds or for as long as possible. Allow the piston or ball to fall to the bottom of the column. 7. Remove the mouthpiece from your  mouth and breathe out normally. 8. Rest for a few seconds and repeat Steps 1 through 7 at least 10 times every 1-2 hours when you are awake. Take your time and take a few normal breaths between deep breaths. 9. The spirometer may include an indicator to show your best effort. Use the indicator as a goal to work toward during each repetition. 10. After each set of 10 deep breaths, practice coughing to be sure your lungs are clear. If you have an incision (the cut made at the time of surgery), support your incision when coughing by placing a pillow or rolled up towels firmly against it. Once you are able to get out of bed, walk around indoors and cough well. You may stop using the incentive spirometer when instructed by your caregiver.  RISKS AND COMPLICATIONS  Take your time so you do not get dizzy or light-headed.  If you are in pain, you may need to take or ask for pain medication before doing incentive spirometry. It is harder to take a deep breath if you are having pain. AFTER USE  Rest and breathe slowly and easily.  It can be helpful to keep track of a log of your progress. Your caregiver can provide you with a simple table to help with this. If you are using the spirometer at home, follow these instructions: [place] IF:   You are having difficultly using the spirometer.  You have trouble using the spirometer as often as instructed.  Your pain medication is not giving enough relief while using the spirometer.  You develop fever of 100.5 F (38.1 C) or higher. SEEK IMMEDIATE MEDICAL CARE IF:   You cough up bloody sputum that had not been present before.  You develop fever of 102 F (38.9 C) or greater.  You develop worsening pain at or near the incision site. MAKE SURE YOU:   Understand these instructions.  Will watch your condition.  Will get help right away if you are not doing well or get worse. Document Released: 08/26/2006 Document Revised: 07/08/2011  Document Reviewed: 10/27/2006 [facility] Patient Information 2014 [place], [place].   ________________________________________________________________________

## 2016-10-17 NOTE — Progress Notes (Signed)
Lov/ cardiology clearance Revankar MD 08-21-16 epic/chart  EKG 08-21-16 chart  Stress test 08-27-16 chart

## 2016-10-18 ENCOUNTER — Encounter (INDEPENDENT_AMBULATORY_CARE_PROVIDER_SITE_OTHER): Payer: Self-pay

## 2016-10-18 ENCOUNTER — Encounter (HOSPITAL_COMMUNITY)
Admission: RE | Admit: 2016-10-18 | Discharge: 2016-10-18 | Disposition: A | Discharge status: Home / Self Care | Payer: Medicare Other | Source: Ambulatory Visit | Attending: Orthopedic Surgery | Admitting: Orthopedic Surgery

## 2016-10-18 ENCOUNTER — Encounter (HOSPITAL_COMMUNITY): Payer: Self-pay

## 2016-10-18 ENCOUNTER — Ambulatory Visit (HOSPITAL_COMMUNITY)
Admission: RE | Admit: 2016-10-18 | Discharge: 2016-10-18 | Disposition: A | Discharge status: Home / Self Care | Payer: Medicare Other | Source: Ambulatory Visit | Attending: Surgical | Admitting: Surgical

## 2016-10-18 DIAGNOSIS — M5136 Other intervertebral disc degeneration, lumbar region: Secondary | ICD-10-CM | POA: Diagnosis not present

## 2016-10-18 DIAGNOSIS — M47896 Other spondylosis, lumbar region: Secondary | ICD-10-CM | POA: Insufficient documentation

## 2016-10-18 DIAGNOSIS — Z01812 Encounter for preprocedural laboratory examination: Secondary | ICD-10-CM | POA: Insufficient documentation

## 2016-10-18 DIAGNOSIS — Z01810 Encounter for preprocedural cardiovascular examination: Secondary | ICD-10-CM | POA: Insufficient documentation

## 2016-10-18 DIAGNOSIS — M419 Scoliosis, unspecified: Secondary | ICD-10-CM | POA: Diagnosis not present

## 2016-10-18 DIAGNOSIS — M5126 Other intervertebral disc displacement, lumbar region: Secondary | ICD-10-CM | POA: Diagnosis not present

## 2016-10-18 DIAGNOSIS — Z01818 Encounter for other preprocedural examination: Secondary | ICD-10-CM

## 2016-10-18 DIAGNOSIS — M48061 Spinal stenosis, lumbar region without neurogenic claudication: Secondary | ICD-10-CM | POA: Insufficient documentation

## 2016-10-18 DIAGNOSIS — R931 Abnormal findings on diagnostic imaging of heart and coronary circulation: Secondary | ICD-10-CM | POA: Diagnosis not present

## 2016-10-18 HISTORY — DX: Prediabetes: R73.03

## 2016-10-18 HISTORY — DX: Unspecified osteoarthritis, unspecified site: M19.90

## 2016-10-18 LAB — CBC WITH DIFFERENTIAL/PLATELET
Basophils Absolute: 0 10*3/uL (ref 0.0–0.1)
Basophils Relative: 1 %
Eosinophils Absolute: 0.1 10*3/uL (ref 0.0–0.7)
Eosinophils Relative: 1 %
HCT: 35.5 % — ABNORMAL LOW (ref 36.0–46.0)
Hemoglobin: 11.5 g/dL — ABNORMAL LOW (ref 12.0–15.0)
Lymphocytes Relative: 35 %
Lymphs Abs: 2.2 10*3/uL (ref 0.7–4.0)
MCH: 25.8 pg — ABNORMAL LOW (ref 26.0–34.0)
MCHC: 32.4 g/dL (ref 30.0–36.0)
MCV: 79.6 fL (ref 78.0–100.0)
Monocytes Absolute: 0.6 10*3/uL (ref 0.1–1.0)
Monocytes Relative: 9 %
Neutro Abs: 3.4 10*3/uL (ref 1.7–7.7)
Neutrophils Relative %: 54 %
Platelets: 189 10*3/uL (ref 150–400)
RBC: 4.46 MIL/uL (ref 3.87–5.11)
RDW: 14.5 % (ref 11.5–15.5)
WBC: 6.3 10*3/uL (ref 4.0–10.5)

## 2016-10-18 LAB — COMPREHENSIVE METABOLIC PANEL
ALT: 22 U/L (ref 14–54)
AST: 35 U/L (ref 15–41)
Albumin: 4 g/dL (ref 3.5–5.0)
Alkaline Phosphatase: 103 U/L (ref 38–126)
Anion gap: 9 (ref 5–15)
BUN: 11 mg/dL (ref 6–20)
CO2: 33 mmol/L — ABNORMAL HIGH (ref 22–32)
Calcium: 9.2 mg/dL (ref 8.9–10.3)
Chloride: 96 mmol/L — ABNORMAL LOW (ref 101–111)
Creatinine, Ser: 1.11 mg/dL — ABNORMAL HIGH (ref 0.44–1.00)
GFR calc Af Amer: 59 mL/min — ABNORMAL LOW (ref >60)
GFR calc non Af Amer: 51 mL/min — ABNORMAL LOW (ref >60)
Glucose, Bld: 112 mg/dL — ABNORMAL HIGH (ref 65–99)
Potassium: 3 mmol/L — ABNORMAL LOW (ref 3.5–5.1)
Sodium: 138 mmol/L (ref 135–145)
Total Bilirubin: 1.1 mg/dL (ref 0.3–1.2)
Total Protein: 8.3 g/dL — ABNORMAL HIGH (ref 6.5–8.1)

## 2016-10-18 LAB — SURGICAL PCR SCREEN
MRSA, PCR: NEGATIVE
Staphylococcus aureus: NEGATIVE

## 2016-10-18 LAB — PROTIME-INR
INR: 1.9
Prothrombin Time: 22.1 seconds — ABNORMAL HIGH (ref 11.4–15.2)

## 2016-10-18 LAB — APTT: aPTT: 41 seconds — ABNORMAL HIGH (ref 24–36)

## 2016-10-18 NOTE — Progress Notes (Signed)
   10/18/16 1026  OBSTRUCTIVE SLEEP APNEA  Have you ever been diagnosed with sleep apnea through a sleep study? No  Do you snore loudly (loud enough to be heard through closed doors)?  1  Do you often feel tired, fatigued, or sleepy during the daytime (such as falling asleep during driving or talking to someone)? 1  Has anyone observed you stop breathing during your sleep? 1  Do you have, or are you being treated for high blood pressure? 1  BMI more than 35 kg/m2? 1  Age > 50 (1-yes) 1  Neck circumference greater than:Female 16 inches or larger, Female 17inches or larger? 1  Female Gender (Yes=1) 0  Obstructive Sleep Apnea Score 7

## 2016-10-18 NOTE — Progress Notes (Signed)
CMP routed via epic to [facility] MD

## 2016-10-18 NOTE — Progress Notes (Signed)
PT, PTT routed via epic to gioffre MD

## 2016-10-19 LAB — HEMOGLOBIN A1C
Hgb A1c MFr Bld: 6 % — ABNORMAL HIGH (ref 4.8–5.6)
Mean Plasma Glucose: 126 mg/dL

## 2016-10-22 NOTE — Anesthesia Preprocedure Evaluation (Addendum)
Anesthesia Evaluation  Patient identified by MRN, date of birth, ID band Patient awake    Reviewed: Allergy & Precautions, NPO status , Patient's Chart, lab work & pertinent test results  History of Anesthesia Complications Negative for: history of anesthetic complications  Airway Mallampati: I  TM Distance: >3 FB Neck ROM: Full    Dental  (+) Edentulous Upper, Edentulous Lower   Pulmonary neg pulmonary ROS,    breath sounds clear to auscultation       Cardiovascular hypertension, Pt. on medications (-) angina+ dysrhythmias  Rhythm:Irregular Rate:Normal  5/18 Stress test: EF 66%, normal wall motion at rest and stress, no ischemia   Neuro/Psych Anxiety Depression Chronic back pain CVA    GI/Hepatic Neg liver ROS, GERD  Medicated and Controlled,  Endo/Other  Hypothyroidism Morbid obesity  Renal/GU negative Renal ROS     Musculoskeletal  (+) Arthritis ,   Abdominal (+) + obese,   Peds  Hematology Xarelto: off of since friday   Anesthesia Other Findings   Reproductive/Obstetrics                            Anesthesia Physical Anesthesia Plan  ASA: III  Anesthesia Plan: General   Post-op Pain Management:    Induction: Intravenous  PONV Risk Score and Plan: 4 or greater and Ondansetron, Dexamethasone, Midazolam and Scopolamine patch - Pre-op  Airway Management Planned: Oral ETT  Additional Equipment:   Intra-op Plan:   Post-operative Plan: Extubation in OR  Informed Consent: I have reviewed the patients History and Physical, chart, labs and discussed the procedure including the risks, benefits and alternatives for the proposed anesthesia with the patient or authorized representative who has indicated his/her understanding and acceptance.     Plan Discussed with: CRNA and Surgeon  Anesthesia Plan Comments: (Plan routine monitors, GETA)        Anesthesia Quick Evaluation

## 2016-10-23 ENCOUNTER — Ambulatory Visit (HOSPITAL_COMMUNITY): Payer: Medicare Other | Admitting: Anesthesiology

## 2016-10-23 ENCOUNTER — Ambulatory Visit (HOSPITAL_COMMUNITY): Payer: Medicare Other

## 2016-10-23 ENCOUNTER — Inpatient Hospital Stay (HOSPITAL_COMMUNITY)
Admission: AD | Admit: 2016-10-23 | Discharge: 2016-10-27 | DRG: 519 | Disposition: A | Discharge status: Home Health | Payer: Medicare Other | Source: Ambulatory Visit | Attending: Orthopedic Surgery | Admitting: Orthopedic Surgery

## 2016-10-23 ENCOUNTER — Encounter (HOSPITAL_COMMUNITY)
Admission: AD | Disposition: A | Discharge status: Home Health | Payer: Self-pay | Source: Ambulatory Visit | Attending: Orthopedic Surgery

## 2016-10-23 ENCOUNTER — Encounter (HOSPITAL_COMMUNITY): Payer: Self-pay | Admitting: *Deleted

## 2016-10-23 DIAGNOSIS — I959 Hypotension, unspecified: Secondary | ICD-10-CM | POA: Diagnosis present

## 2016-10-23 DIAGNOSIS — F329 Major depressive disorder, single episode, unspecified: Secondary | ICD-10-CM | POA: Diagnosis present

## 2016-10-23 DIAGNOSIS — M1990 Unspecified osteoarthritis, unspecified site: Secondary | ICD-10-CM | POA: Diagnosis present

## 2016-10-23 DIAGNOSIS — Z79899 Other long term (current) drug therapy: Secondary | ICD-10-CM

## 2016-10-23 DIAGNOSIS — F419 Anxiety disorder, unspecified: Secondary | ICD-10-CM | POA: Diagnosis present

## 2016-10-23 DIAGNOSIS — R9431 Abnormal electrocardiogram [ECG] [EKG]: Secondary | ICD-10-CM | POA: Diagnosis not present

## 2016-10-23 DIAGNOSIS — I9581 Postprocedural hypotension: Secondary | ICD-10-CM

## 2016-10-23 DIAGNOSIS — D649 Anemia, unspecified: Secondary | ICD-10-CM | POA: Diagnosis not present

## 2016-10-23 DIAGNOSIS — I252 Old myocardial infarction: Secondary | ICD-10-CM

## 2016-10-23 DIAGNOSIS — M48061 Spinal stenosis, lumbar region without neurogenic claudication: Secondary | ICD-10-CM | POA: Diagnosis not present

## 2016-10-23 DIAGNOSIS — M48062 Spinal stenosis, lumbar region with neurogenic claudication: Secondary | ICD-10-CM | POA: Diagnosis not present

## 2016-10-23 DIAGNOSIS — M5126 Other intervertebral disc displacement, lumbar region: Secondary | ICD-10-CM | POA: Diagnosis present

## 2016-10-23 DIAGNOSIS — I952 Hypotension due to drugs: Secondary | ICD-10-CM | POA: Diagnosis not present

## 2016-10-23 DIAGNOSIS — Z6841 Body Mass Index (BMI) 40.0 and over, adult: Secondary | ICD-10-CM | POA: Diagnosis not present

## 2016-10-23 DIAGNOSIS — M4316 Spondylolisthesis, lumbar region: Secondary | ICD-10-CM | POA: Diagnosis not present

## 2016-10-23 DIAGNOSIS — T50905A Adverse effect of unspecified drugs, medicaments and biological substances, initial encounter: Secondary | ICD-10-CM | POA: Diagnosis not present

## 2016-10-23 DIAGNOSIS — I10 Essential (primary) hypertension: Secondary | ICD-10-CM | POA: Diagnosis not present

## 2016-10-23 DIAGNOSIS — Z419 Encounter for procedure for purposes other than remedying health state, unspecified: Secondary | ICD-10-CM

## 2016-10-23 DIAGNOSIS — R7303 Prediabetes: Secondary | ICD-10-CM | POA: Diagnosis not present

## 2016-10-23 DIAGNOSIS — Z8673 Personal history of transient ischemic attack (TIA), and cerebral infarction without residual deficits: Secondary | ICD-10-CM | POA: Diagnosis not present

## 2016-10-23 DIAGNOSIS — Z9889 Other specified postprocedural states: Secondary | ICD-10-CM

## 2016-10-23 DIAGNOSIS — I480 Paroxysmal atrial fibrillation: Secondary | ICD-10-CM | POA: Diagnosis not present

## 2016-10-23 DIAGNOSIS — M5136 Other intervertebral disc degeneration, lumbar region: Secondary | ICD-10-CM | POA: Diagnosis not present

## 2016-10-23 DIAGNOSIS — M21372 Foot drop, left foot: Secondary | ICD-10-CM | POA: Diagnosis not present

## 2016-10-23 DIAGNOSIS — I340 Nonrheumatic mitral (valve) insufficiency: Secondary | ICD-10-CM | POA: Diagnosis not present

## 2016-10-23 DIAGNOSIS — I481 Persistent atrial fibrillation: Secondary | ICD-10-CM | POA: Diagnosis not present

## 2016-10-23 DIAGNOSIS — E039 Hypothyroidism, unspecified: Secondary | ICD-10-CM | POA: Diagnosis present

## 2016-10-23 DIAGNOSIS — Z7901 Long term (current) use of anticoagulants: Secondary | ICD-10-CM | POA: Diagnosis not present

## 2016-10-23 HISTORY — PX: LUMBAR LAMINECTOMY/DECOMPRESSION MICRODISCECTOMY: SHX5026

## 2016-10-23 LAB — POCT I-STAT 7, (LYTES, BLD GAS, ICA,H+H)
Acid-Base Excess: 3 mmol/L — ABNORMAL HIGH (ref 0.0–2.0)
Bicarbonate: 29.4 mmol/L — ABNORMAL HIGH (ref 20.0–28.0)
Calcium, Ion: 1.2 mmol/L (ref 1.15–1.40)
HEMATOCRIT: 39 % (ref 36.0–46.0)
HEMOGLOBIN: 13.3 g/dL (ref 12.0–15.0)
O2 SAT: 100 %
PCO2 ART: 48.8 mmHg — AB (ref 32.0–48.0)
PO2 ART: 334 mmHg — AB (ref 83.0–108.0)
POTASSIUM: 3.3 mmol/L — AB (ref 3.5–5.1)
Sodium: 136 mmol/L (ref 135–145)
TCO2: 31 mmol/L (ref 0–100)
pH, Arterial: 7.387 (ref 7.350–7.450)

## 2016-10-23 LAB — TROPONIN I
TROPONIN I: 0.04 ng/mL — AB (ref <0.03)
TROPONIN I: 0.03 ng/mL — AB (ref <0.03)
Troponin I: 0.03 ng/mL (ref <0.03)

## 2016-10-23 LAB — MRSA PCR SCREENING: MRSA BY PCR: NEGATIVE

## 2016-10-23 LAB — GLUCOSE, CAPILLARY: Glucose-Capillary: 125 mg/dL — ABNORMAL HIGH (ref 65–99)

## 2016-10-23 SURGERY — LUMBAR LAMINECTOMY/DECOMPRESSION MICRODISCECTOMY
Anesthesia: General

## 2016-10-23 MED ORDER — ONDANSETRON HCL 4 MG/2ML IJ SOLN
INTRAMUSCULAR | Status: DC | PRN
  Administered 2016-10-23: 4 mg via INTRAVENOUS

## 2016-10-23 MED ORDER — CEFAZOLIN SODIUM-DEXTROSE 1-4 GM/50ML-% IV SOLN
1.0000 g | Freq: Three times a day (TID) | INTRAVENOUS | Status: AC
  Administered 2016-10-23 – 2016-10-24 (×3): 1 g via INTRAVENOUS
  Filled 2016-10-23 (×4): qty 50

## 2016-10-23 MED ORDER — ONDANSETRON HCL 4 MG/2ML IJ SOLN: 4.0000 mg | Freq: Four times a day (QID) | INTRAMUSCULAR | Status: DC | PRN

## 2016-10-23 MED ORDER — BUPIVACAINE LIPOSOME 1.3 % IJ SUSP
20.0000 mL | Freq: Once | INTRAMUSCULAR | Status: AC
  Filled 2016-10-23: qty 20

## 2016-10-23 MED ORDER — LIDOCAINE-EPINEPHRINE (PF) 1 %-1:200000 IJ SOLN
INTRAMUSCULAR | Status: DC | PRN
  Administered 2016-10-23: 20 mL

## 2016-10-23 MED ORDER — SUGAMMADEX SODIUM 200 MG/2ML IV SOLN
INTRAVENOUS | Status: DC | PRN
  Administered 2016-10-23: 200 mg via INTRAVENOUS

## 2016-10-23 MED ORDER — PHENYLEPHRINE 40 MCG/ML (10ML) SYRINGE FOR IV PUSH (FOR BLOOD PRESSURE SUPPORT)
PREFILLED_SYRINGE | INTRAVENOUS | Status: AC
  Filled 2016-10-23: qty 10

## 2016-10-23 MED ORDER — ALBUMIN HUMAN 5 % IV SOLN
INTRAVENOUS | Status: DC | PRN
  Administered 2016-10-23: 09:00:00 via INTRAVENOUS

## 2016-10-23 MED ORDER — NITROGLYCERIN IN D5W 200-5 MCG/ML-% IV SOLN
INTRAVENOUS | Status: AC
  Filled 2016-10-23: qty 250

## 2016-10-23 MED ORDER — ROCURONIUM BROMIDE 10 MG/ML (PF) SYRINGE
PREFILLED_SYRINGE | INTRAVENOUS | Status: DC | PRN
  Administered 2016-10-23 (×2): 10 mg via INTRAVENOUS
  Administered 2016-10-23: 50 mg via INTRAVENOUS

## 2016-10-23 MED ORDER — ESMOLOL HCL 100 MG/10ML IV SOLN
INTRAVENOUS | Status: AC
  Filled 2016-10-23: qty 10

## 2016-10-23 MED ORDER — ROCURONIUM BROMIDE 50 MG/5ML IV SOSY
PREFILLED_SYRINGE | INTRAVENOUS | Status: AC
  Filled 2016-10-23: qty 5

## 2016-10-23 MED ORDER — PHENYLEPHRINE HCL 10 MG/ML IJ SOLN
INTRAVENOUS | Status: DC | PRN
  Administered 2016-10-23: 20 ug/min via INTRAVENOUS

## 2016-10-23 MED ORDER — CEFAZOLIN SODIUM-DEXTROSE 2-4 GM/100ML-% IV SOLN
2.0000 g | INTRAVENOUS | Status: AC
  Administered 2016-10-23: 2 g via INTRAVENOUS

## 2016-10-23 MED ORDER — PROPOFOL 10 MG/ML IV BOLUS
INTRAVENOUS | Status: AC
  Filled 2016-10-23: qty 40

## 2016-10-23 MED ORDER — NITROGLYCERIN IN D5W 200-5 MCG/ML-% IV SOLN: 0.0000 ug/min | INTRAVENOUS | Status: DC

## 2016-10-23 MED ORDER — SODIUM CHLORIDE 0.9 % IV SOLN
INTRAVENOUS | Status: AC
  Filled 2016-10-23: qty 500000

## 2016-10-23 MED ORDER — MEPERIDINE HCL 50 MG/ML IJ SOLN: 6.2500 mg | INTRAMUSCULAR | Status: DC | PRN

## 2016-10-23 MED ORDER — PHENYLEPHRINE HCL-NACL 10-0.9 MG/250ML-% IV SOLN: 0.0000 ug/min | INTRAVENOUS | Status: DC

## 2016-10-23 MED ORDER — MIDAZOLAM HCL 2 MG/2ML IJ SOLN: 0.5000 mg | Freq: Once | INTRAMUSCULAR | Status: DC | PRN

## 2016-10-23 MED ORDER — METHOCARBAMOL 1000 MG/10ML IJ SOLN
500.0000 mg | Freq: Four times a day (QID) | INTRAVENOUS | Status: DC | PRN
  Administered 2016-10-23: 500 mg via INTRAVENOUS
  Filled 2016-10-23: qty 550

## 2016-10-23 MED ORDER — THROMBIN 5000 UNITS EX SOLR
CUTANEOUS | Status: AC
  Filled 2016-10-23: qty 10000

## 2016-10-23 MED ORDER — PHENYLEPHRINE HCL 10 MG/ML IJ SOLN
INTRAMUSCULAR | Status: AC
  Filled 2016-10-23: qty 2

## 2016-10-23 MED ORDER — BISACODYL 5 MG PO TBEC: 5.0000 mg | DELAYED_RELEASE_TABLET | Freq: Every day | ORAL | Status: DC | PRN

## 2016-10-23 MED ORDER — LACTATED RINGERS IV SOLN
INTRAVENOUS | Status: DC
  Administered 2016-10-23 (×3): via INTRAVENOUS

## 2016-10-23 MED ORDER — HYDROMORPHONE HCL 1 MG/ML IJ SOLN
0.5000 mg | INTRAMUSCULAR | Status: DC | PRN
  Administered 2016-10-23 – 2016-10-26 (×2): 0.5 mg via INTRAVENOUS
  Filled 2016-10-23 (×2): qty 0.5

## 2016-10-23 MED ORDER — PHENYLEPHRINE 40 MCG/ML (10ML) SYRINGE FOR IV PUSH (FOR BLOOD PRESSURE SUPPORT)
PREFILLED_SYRINGE | INTRAVENOUS | Status: DC | PRN
  Administered 2016-10-23 (×4): 80 ug via INTRAVENOUS

## 2016-10-23 MED ORDER — HYDROCODONE-ACETAMINOPHEN 10-325 MG PO TABS
1.0000 | ORAL_TABLET | ORAL | Status: DC | PRN
  Administered 2016-10-23 – 2016-10-24 (×2): 1 via ORAL
  Administered 2016-10-24: 2 via ORAL
  Filled 2016-10-23 (×2): qty 1
  Filled 2016-10-23: qty 2

## 2016-10-23 MED ORDER — LIDOCAINE 2% (20 MG/ML) 5 ML SYRINGE
INTRAMUSCULAR | Status: DC | PRN
  Administered 2016-10-23: 100 mg via INTRAVENOUS

## 2016-10-23 MED ORDER — FLEET ENEMA 7-19 GM/118ML RE ENEM: 1.0000 | ENEMA | Freq: Once | RECTAL | Status: DC | PRN

## 2016-10-23 MED ORDER — ALBUTEROL SULFATE (2.5 MG/3ML) 0.083% IN NEBU: 2.5000 mg | INHALATION_SOLUTION | Freq: Four times a day (QID) | RESPIRATORY_TRACT | Status: DC | PRN

## 2016-10-23 MED ORDER — VENLAFAXINE HCL ER 75 MG PO CP24
75.0000 mg | ORAL_CAPSULE | Freq: Every day | ORAL | Status: DC
  Administered 2016-10-24 – 2016-10-27 (×3): 75 mg via ORAL
  Filled 2016-10-23 (×5): qty 1

## 2016-10-23 MED ORDER — PROMETHAZINE HCL 25 MG/ML IJ SOLN: 6.2500 mg | INTRAMUSCULAR | Status: DC | PRN

## 2016-10-23 MED ORDER — PHENOL 1.4 % MT LIQD: 1.0000 | OROMUCOSAL | Status: DC | PRN

## 2016-10-23 MED ORDER — POLYETHYLENE GLYCOL 3350 17 G PO PACK: 17.0000 g | PACK | Freq: Every day | ORAL | Status: DC | PRN

## 2016-10-23 MED ORDER — SUGAMMADEX SODIUM 200 MG/2ML IV SOLN
INTRAVENOUS | Status: AC
  Filled 2016-10-23: qty 2

## 2016-10-23 MED ORDER — ACETAMINOPHEN 325 MG PO TABS
650.0000 mg | ORAL_TABLET | ORAL | Status: DC | PRN
  Administered 2016-10-24: 650 mg via ORAL
  Filled 2016-10-23: qty 2

## 2016-10-23 MED ORDER — PHENYLEPHRINE 8 MG IN D5W 100 ML (0.08MG/ML) PREMIX OPTIME
10.0000 ug/min | INJECTION | Freq: Once | INTRAVENOUS | Status: DC
  Filled 2016-10-23: qty 100

## 2016-10-23 MED ORDER — CHLORHEXIDINE GLUCONATE 4 % EX LIQD: 60.0000 mL | Freq: Once | CUTANEOUS | Status: DC

## 2016-10-23 MED ORDER — DEXAMETHASONE SODIUM PHOSPHATE 10 MG/ML IJ SOLN
INTRAMUSCULAR | Status: AC
  Filled 2016-10-23: qty 1

## 2016-10-23 MED ORDER — MIDAZOLAM HCL 2 MG/2ML IJ SOLN
INTRAMUSCULAR | Status: AC
  Filled 2016-10-23: qty 2

## 2016-10-23 MED ORDER — FENTANYL CITRATE (PF) 100 MCG/2ML IJ SOLN
INTRAMUSCULAR | Status: DC | PRN
  Administered 2016-10-23 (×3): 50 ug via INTRAVENOUS

## 2016-10-23 MED ORDER — ONDANSETRON HCL 4 MG PO TABS: 4.0000 mg | ORAL_TABLET | Freq: Four times a day (QID) | ORAL | Status: DC | PRN

## 2016-10-23 MED ORDER — NITROGLYCERIN 0.2 MG/ML ON CALL CATH LAB
INTRAVENOUS | Status: DC | PRN
  Administered 2016-10-23: 40 ug via INTRAVENOUS

## 2016-10-23 MED ORDER — CEFAZOLIN SODIUM-DEXTROSE 2-4 GM/100ML-% IV SOLN
INTRAVENOUS | Status: AC
  Filled 2016-10-23: qty 100

## 2016-10-23 MED ORDER — OXYCODONE-ACETAMINOPHEN 5-325 MG PO TABS
1.0000 | ORAL_TABLET | Freq: Three times a day (TID) | ORAL | Status: DC | PRN
  Administered 2016-10-25 – 2016-10-26 (×2): 1 via ORAL
  Filled 2016-10-23 (×2): qty 1

## 2016-10-23 MED ORDER — MIDAZOLAM HCL 5 MG/5ML IJ SOLN
INTRAMUSCULAR | Status: DC | PRN
  Administered 2016-10-23 (×2): 1 mg via INTRAVENOUS

## 2016-10-23 MED ORDER — METHOCARBAMOL 500 MG PO TABS: 500.0000 mg | ORAL_TABLET | Freq: Four times a day (QID) | ORAL | Status: DC | PRN

## 2016-10-23 MED ORDER — DEXAMETHASONE SODIUM PHOSPHATE 10 MG/ML IJ SOLN
INTRAMUSCULAR | Status: DC | PRN
  Administered 2016-10-23: 10 mg via INTRAVENOUS

## 2016-10-23 MED ORDER — ONDANSETRON HCL 4 MG/2ML IJ SOLN
INTRAMUSCULAR | Status: AC
  Filled 2016-10-23: qty 2

## 2016-10-23 MED ORDER — SODIUM CHLORIDE 0.9 % IJ SOLN
INTRAMUSCULAR | Status: AC
  Filled 2016-10-23: qty 50

## 2016-10-23 MED ORDER — MENTHOL 3 MG MT LOZG
1.0000 | LOZENGE | OROMUCOSAL | Status: DC | PRN
  Filled 2016-10-23: qty 9

## 2016-10-23 MED ORDER — MOMETASONE FURO-FORMOTEROL FUM 200-5 MCG/ACT IN AERO
2.0000 | INHALATION_SPRAY | Freq: Two times a day (BID) | RESPIRATORY_TRACT | Status: DC
  Administered 2016-10-23 – 2016-10-27 (×8): 2 via RESPIRATORY_TRACT
  Filled 2016-10-23: qty 8.8

## 2016-10-23 MED ORDER — SUCCINYLCHOLINE CHLORIDE 200 MG/10ML IV SOSY
PREFILLED_SYRINGE | INTRAVENOUS | Status: AC
Start: 2016-10-23 — End: 2016-10-23
  Filled 2016-10-23: qty 10

## 2016-10-23 MED ORDER — PROPOFOL 10 MG/ML IV BOLUS
INTRAVENOUS | Status: DC | PRN
  Administered 2016-10-23: 150 mg via INTRAVENOUS

## 2016-10-23 MED ORDER — BACITRACIN-NEOMYCIN-POLYMYXIN 400-5-5000 EX OINT
TOPICAL_OINTMENT | CUTANEOUS | Status: AC
  Filled 2016-10-23: qty 1

## 2016-10-23 MED ORDER — LIDOCAINE-EPINEPHRINE (PF) 1 %-1:200000 IJ SOLN
INTRAMUSCULAR | Status: AC
  Filled 2016-10-23: qty 30

## 2016-10-23 MED ORDER — NITROGLYCERIN IN D5W 200-5 MCG/ML-% IV SOLN
INTRAVENOUS | Status: DC | PRN
  Administered 2016-10-23: 5 ug/min via INTRAVENOUS

## 2016-10-23 MED ORDER — POLYMYXIN B SULFATE 500000 UNITS IJ SOLR
INTRAMUSCULAR | Status: DC | PRN
  Administered 2016-10-23: 500 mL

## 2016-10-23 MED ORDER — ACETAMINOPHEN 650 MG RE SUPP: 650.0000 mg | RECTAL | Status: DC | PRN

## 2016-10-23 MED ORDER — FENTANYL CITRATE (PF) 250 MCG/5ML IJ SOLN
INTRAMUSCULAR | Status: AC
  Filled 2016-10-23: qty 5

## 2016-10-23 MED ORDER — LACTATED RINGERS IV SOLN
INTRAVENOUS | Status: DC
  Administered 2016-10-23: 12:00:00 via INTRAVENOUS

## 2016-10-23 MED ORDER — BUPIVACAINE LIPOSOME 1.3 % IJ SUSP
INTRAMUSCULAR | Status: DC | PRN
  Administered 2016-10-23: 20 mL

## 2016-10-23 MED ORDER — BACITRACIN-NEOMYCIN-POLYMYXIN 400-5-5000 EX OINT
TOPICAL_OINTMENT | CUTANEOUS | Status: DC | PRN
  Administered 2016-10-23: 1 via TOPICAL

## 2016-10-23 MED ORDER — HYDROMORPHONE HCL 1 MG/ML IJ SOLN
0.2500 mg | INTRAMUSCULAR | Status: DC | PRN
  Filled 2016-10-23: qty 0.5

## 2016-10-23 MED ORDER — LIDOCAINE 2% (20 MG/ML) 5 ML SYRINGE
INTRAMUSCULAR | Status: AC
  Filled 2016-10-23: qty 5

## 2016-10-23 MED ORDER — ESMOLOL HCL 100 MG/10ML IV SOLN
INTRAVENOUS | Status: DC | PRN
  Administered 2016-10-23 (×2): 20 mg via INTRAVENOUS

## 2016-10-23 MED ORDER — SODIUM CHLORIDE 0.9 % IV SOLN: 0.0000 ug/min | INTRAVENOUS | Status: DC

## 2016-10-23 SURGICAL SUPPLY — 49 items
AGENT HMST SPONGE THK3/8 (HEMOSTASIS) ×1
APL SKNCLS STERI-STRIP NONHPOA (GAUZE/BANDAGES/DRESSINGS) ×1
BAG SPEC THK2 15X12 ZIP CLS (MISCELLANEOUS)
BAG ZIPLOCK 12X15 (MISCELLANEOUS) IMPLANT
BENZOIN TINCTURE PRP APPL 2/3 (GAUZE/BANDAGES/DRESSINGS) ×2 IMPLANT
CLEANER TIP ELECTROSURG 2X2 (MISCELLANEOUS) ×2 IMPLANT
COVER SURGICAL LIGHT HANDLE (MISCELLANEOUS) ×2 IMPLANT
DRAIN PENROSE 18X1/4 LTX STRL (WOUND CARE) IMPLANT
DRAPE MICROSCOPE LEICA (MISCELLANEOUS) ×2 IMPLANT
DRAPE POUCH INSTRU U-SHP 10X18 (DRAPES) ×2 IMPLANT
DRAPE SHEET LG 3/4 BI-LAMINATE (DRAPES) ×2 IMPLANT
DRAPE SURG 17X11 SM STRL (DRAPES) ×2 IMPLANT
DRSG ADAPTIC 3X8 NADH LF (GAUZE/BANDAGES/DRESSINGS) ×2 IMPLANT
DRSG PAD ABDOMINAL 8X10 ST (GAUZE/BANDAGES/DRESSINGS) ×7 IMPLANT
DURAPREP 26ML APPLICATOR (WOUND CARE) ×2 IMPLANT
ELECT BLADE TIP CTD 4 INCH (ELECTRODE) ×2 IMPLANT
ELECT REM PT RETURN 15FT ADLT (MISCELLANEOUS) ×2 IMPLANT
GAUZE SPONGE 4X4 12PLY STRL (GAUZE/BANDAGES/DRESSINGS) ×2 IMPLANT
GLOVE BIOGEL PI IND STRL 6.5 (GLOVE) ×1 IMPLANT
GLOVE BIOGEL PI IND STRL 8.5 (GLOVE) ×1 IMPLANT
GLOVE BIOGEL PI INDICATOR 6.5 (GLOVE) ×1
GLOVE BIOGEL PI INDICATOR 8.5 (GLOVE) ×1
GLOVE ECLIPSE 8.0 STRL XLNG CF (GLOVE) ×4 IMPLANT
GLOVE SURG SS PI 6.5 STRL IVOR (GLOVE) ×2 IMPLANT
GOWN STRL REUS W/ TWL LRG LVL3 (GOWN DISPOSABLE) ×1 IMPLANT
GOWN STRL REUS W/TWL LRG LVL3 (GOWN DISPOSABLE) ×2
GOWN STRL REUS W/TWL XL LVL3 (GOWN DISPOSABLE) ×4 IMPLANT
HEMOSTAT SPONGE AVITENE ULTRA (HEMOSTASIS) ×2 IMPLANT
KIT BASIN OR (CUSTOM PROCEDURE TRAY) ×2 IMPLANT
KIT POSITIONING SURG ANDREWS (MISCELLANEOUS) ×2 IMPLANT
MANIFOLD NEPTUNE II (INSTRUMENTS) ×2 IMPLANT
MARKER SKIN DUAL TIP RULER LAB (MISCELLANEOUS) ×2 IMPLANT
NDL SPNL 18GX3.5 QUINCKE PK (NEEDLE) ×2 IMPLANT
NEEDLE HYPO 22GX1.5 SAFETY (NEEDLE) ×4 IMPLANT
NEEDLE SPNL 18GX3.5 QUINCKE PK (NEEDLE) ×8 IMPLANT
PACK LAMINECTOMY ORTHO (CUSTOM PROCEDURE TRAY) ×2 IMPLANT
PATTIES SURGICAL .5 X.5 (GAUZE/BANDAGES/DRESSINGS) ×1 IMPLANT
PATTIES SURGICAL .75X.75 (GAUZE/BANDAGES/DRESSINGS) ×2 IMPLANT
PATTIES SURGICAL 1X1 (DISPOSABLE) ×2 IMPLANT
PIN SAFETY NICK PLATE  2 MED (MISCELLANEOUS)
PIN SAFETY NICK PLATE 2 MED (MISCELLANEOUS) IMPLANT
SPONGE LAP 4X18 X RAY DECT (DISPOSABLE) ×6 IMPLANT
STAPLER VISISTAT 35W (STAPLE) ×2 IMPLANT
SUT VIC AB 1 CT1 27 (SUTURE) ×6
SUT VIC AB 1 CT1 27XBRD ANTBC (SUTURE) ×2 IMPLANT
SUT VIC AB 2-0 CT1 27 (SUTURE) ×4
SUT VIC AB 2-0 CT1 TAPERPNT 27 (SUTURE) ×2 IMPLANT
SYR 20CC LL (SYRINGE) ×4 IMPLANT
TOWEL OR 17X26 10 PK STRL BLUE (TOWEL DISPOSABLE) ×2 IMPLANT

## 2016-10-23 NOTE — Anesthesia Procedure Notes (Signed)
Procedure Name: Intubation Date/Time: 10/23/2016 7:35 AM Performed by: Carleene Cooper A Pre-anesthesia Checklist: Patient identified, Emergency Drugs available, Suction available, Patient being monitored and Timeout performed Patient Re-evaluated:Patient Re-evaluated prior to inductionOxygen Delivery Method: Circle system utilized Preoxygenation: Pre-oxygenation with 100% oxygen Intubation Type: IV induction Ventilation: Mask ventilation without difficulty Laryngoscope Size: Mac and 4 Grade View: Grade I Tube type: Oral Tube size: 7.5 mm Number of attempts: 1 Airway Equipment and Method: Stylet Placement Confirmation: ETT inserted through vocal cords under direct vision,  positive ETCO2 and breath sounds checked- equal and bilateral Secured at: 21 cm Tube secured with: Tape Dental Injury: Teeth and Oropharynx as per pre-operative assessment

## 2016-10-23 NOTE — Transfer of Care (Signed)
Immediate Anesthesia Transfer of Care Note  Patient: [name] [name]  Procedure(s) Performed: Procedure(s): LUMBAR LAMINECTOMY/DECOMPRESSION MICRODISCECTOMY (N/A)  Patient Location: PACU  Anesthesia Type:General  Level of Consciousness: awake, alert , oriented and patient cooperative  Airway & Oxygen Therapy: Patient Spontanous Breathing and Patient connected to face mask oxygen  Post-op Assessment: Report given to RN, Post -op Vital signs reviewed and stable and Patient moving all extremities  Post vital signs: Reviewed and stable  Last Vitals:  Vitals:   10/23/16 0550  BP: 137/88  Pulse: 79  Resp: 18  Temp: 36.4 C    Last Pain:  Vitals:   10/23/16 0550  TempSrc: Oral  PainSc:       Patients Stated Pain Goal: 3 (78/67/54 4920)  Complications: Transient ST depression, treated, Cardiology consulted

## 2016-10-23 NOTE — Anesthesia Postprocedure Evaluation (Signed)
Anesthesia Post Note  Patient: [name] [name]  Procedure(s) Performed: Procedure(s) (LRB): LUMBAR LAMINECTOMY/DECOMPRESSION MICRODISCECTOMY (N/A)     Patient location during evaluation: PACU Anesthesia Type: General Level of consciousness: awake and alert, patient cooperative and oriented Pain management: pain level controlled Vital Signs Assessment: post-procedure vital signs reviewed and stable Respiratory status: spontaneous breathing, nonlabored ventilation, respiratory function stable and patient connected to nasal cannula oxygen Cardiovascular status: blood pressure returned to baseline and stable Postop Assessment: no signs of nausea or vomiting Anesthetic complications: no Comments: Pt with intra-op ST depression, resolved quickly with Phenylephrine support of BP, NTG, esmolol. Appreciate cardiology input. Pt for Cardiac Catheterization on Friday. Discussed with pt, Dr. Acie Fredrickson (cards), Dr Gladstone Lighter.    Last Vitals:  Vitals:   10/23/16 1230 10/23/16 1300  BP: (!) 119/57   Pulse:    Resp:  19  Temp:      Last Pain:  Vitals:   10/23/16 1230  TempSrc:   PainSc: 0-No pain                 Keldric Poyer,E. Lidya Mccalister

## 2016-10-23 NOTE — Op Note (Signed)
NAMESHARLOT, [name] [name]              ACCOUNT NO.:  [id number]  MEDICAL RECORD NO.:  [id number]  LOCATION:  [facility]                         FACILITY:  [facility]  PHYSICIAN:  Kipp Brood. Tyqwan Pink, M.D.DATE OF BIRTH:  17-Nov-1950  DATE OF PROCEDURE:  10/23/2016 DATE OF DISCHARGE:                              OPERATIVE REPORT   SURGEON:  Kipp Brood. Gladstone Lighter, M.D.  OPERATIVE ASSISTANT:  Ardeen Jourdain, PA.  PREOPERATIVE DIAGNOSES: 1. Grade 1 pseudospondylolisthesis at L4-L5. 2. Herniated disk at L4-L5 on the left. 3. Partial footdrop on the left. 4. Foraminal stenosis involving the L4 root on the left. 5. Foraminal stenosis involving the L5 root on the left.  POSTOPERATIVE DIAGNOSES: 1. Grade 1 pseudospondylolisthesis at L4-L5. 2. Herniated disk at L4-L5 on the left. 3. Partial footdrop on the left. 4. Foraminal stenosis involving the L4 root on the left. 5. Foraminal stenosis involving the L5 root on the left.  OPERATION: 1. A complete decompressive lumbar laminectomy at L4-L5 for spinal     stenosis. 2. Microdiskectomy at L4-5 on the left for herniated disk. 3. Foraminotomy for the L4 root on the left. 4. Foraminotomy for the L5 root on the left.  DESCRIPTION OF PROCEDURE:  Under general anesthesia, the patient on spinal frame, where the routine orthopedic prep and draping of the lower back was carried out.  I marked the appropriate left side of her back in the holding area, even though we were going central, I marked to the left side because that is where her symptoms were and she had a partial footdrop on the left.  At this time, the appropriate time-out was called in the operating room.  She was on a spinal frame.  After sterile prep and draping, 2 needles were placed in the back for localization purposes.  She also had 2 g of IV Ancef.  X-ray was taken to verify the position.  After this, I injected 20 mL of 1% Marcaine and epinephrine in the soft tissue to control bleeding.  At this  particular time, we then made an incision over the L4-5 interspace and extended the incision proximally and distally.  Bleeders were identified and cauterized. Following that, we then dissected the muscle from the lamina and spinous processes bilaterally.  We took a great deal of time cauterizing the small little venous bleeders.  Once we had bleeding under control, we then proceeded with an x-ray to verify the exact position.  Clamp was placed on the L4 spinous process.  Following that, we then inserted the [facility] retractors and then carried out our procedure.  We removed the spinous process at L4, L5, part of 3 above.  Part of S1 below.  We went down centrally, we preserved the facets.  We had to do a partial facetectomy on the left and decompress that lateral recess because that is where all her symptoms were.  We then brought the microscope in and gradually dissected down, identified the ligamentum flavum and gently removed that.  It was adhered to the dura.  We did not obtain any dural leaks.  We gently freed up the dura.  The dura was literally stuck down to the bone and ligamentum flavum.  We went out laterally on the left and decompressed the recess.  We did foraminotomies for the L4 and the L5 root.  It was not able to easily pass the hockey-stick out the foramina.  We then gently retracted the L5 root over and identified disk space, put a spinal needle in the posterior longitudinal ligament. Cruciate incision was made and then we did a partial microdiskectomy on her.  The nerve root now was quite free.  The dura was free.  We thoroughly irrigated out the area.  Note, she had some ST-segment depression in the operating room.  The anesthesiologist managed that quite nicely.  We decided to postop put her on a monitored bed and get a cardiac consult in the recovery room.  I did have cardiac clearance prior to surgery.  We thoroughly irrigated out the wound, loosely applied some  thrombin-soaked Gelfoam, closed the wound in layers in usual fashion except I left a small distal and proximal deep part of the wound open for drainage purposes.  Subcu was closed in the usual fashion after we injected 20 mL of Exparel.  We then closed the skin with staples.  Sterile dressings were applied.  The patient was returned to the recovery room and she will be maintained on a monitored bed.          ______________________________ Kipp Brood. Gladstone Lighter, M.D.     RAG/MEDQ  D:  10/23/2016  T:  10/23/2016  Job:  [id number]

## 2016-10-23 NOTE — Brief Op Note (Signed)
10/23/2016  10:17 AM  PATIENT:  Deland Pretty  66 y.o. female  PRE-OPERATIVE DIAGNOSIS:  L4-L5 HNP/Stenosis  And Herniated Lumbar Disc and Foraminal Stenosis involving her L-4and L-5 nerve roots on the left.  POST-OPERATIVE DIAGNOSIS:  Same as Pre-Op  PROCEDURE:  Procedure(s): LUMBAR LAMINECTOMY/DECOMPRESSION MICRODISCECTOMY (N/A)and Foraminotomies of L-4 and L-5 nerve roots on the left for Herniated Lumbar Disc and Spinal Stenosis and Foraminal Stenosis.  SURGEON:  Surgeon(s) and Role:    * Latanya Maudlin, MD - Primary  PHYSICIAN ASSISTANT: Ardeen Jourdain PA  ASSISTANTS: Ardeen Jourdain PA  ANESTHESIA:   general  EBL:  Total I/O In: 2250 [I.V.:2000; IV Piggyback:250] Out: 450 [Urine:150; Blood:300]  BLOOD ADMINISTERED:none  DRAINS: none   LOCAL MEDICATIONS USED:  MARCAINE 20cc of 1% with Epinephrine. At start of the case and20cc of Exparel at the end of the case.    SPECIMEN:  No Specimen  DISPOSITION OF SPECIMEN:  N/A  COUNTS:  YES  TOURNIQUET:  * No tourniquets in log *  DICTATION: .Other Dictation: Dictation Number [id number]  PLAN OF CARE: Admit for overnight observation  PATIENT DISPOSITION:  PACU - hemodynamically stable.   Delay start of Pharmacological VTE agent (>24hrs) due to surgical blood loss or risk of bleeding: yes

## 2016-10-23 NOTE — Consult Note (Signed)
Cardiology Consultation:   Patient ID: NEVILLE PAULS; [id number]; 04-23-1951   Admit date: 10/23/2016 Date of Consult: 10/23/2016  Primary Care Provider: Nicholos Johns, MD Primary Cardiologist: Jyl Heinz, MD Tia Alert)  ,  New to [facility]linke at [facility] .  Primary Electrophysiologist:      Patient Profile:   [name] [name] is a 66 y.o. female with a hx of HTN, PAF, inc. RBBB  who is being seen today for the evaluation of ST depression and hypotension during back surgery  at the request of Annye Asa, MD and Dr.  Lavella Lemons.  History of Present Illness:   Ms. Magner is typically see in [place].  Has a hx of obesity , HTN, pre-diabetes. Had a pre-op stress myoview which was low risk - no ischemia,  Normal LV function.   Was having back surgery today  Was stable for 1.5 hours and then suddenly had hypotension followed very quickly by ST depression. I spoke to Dr. Annye Asa of anesthesia.   Dr. Glennon Mac gave him Albumin, Neosynephrine, Esmolol and then NTG drip .  The hypotension resolved and ST depression resolved fairly quickly .     Denies any chest pain currently. Admits to having some episodes of chest tightness - typically with exertion. She does not do any exercise.   Past Medical History:  Diagnosis Date  . Arthritis   . Hypertension   . MI (myocardial infarction) ([place])   . Pre-diabetes   . Stroke ([place])   . Thyroid disease     History reviewed. No pertinent surgical history.   Inpatient Medications: Scheduled Meds: . bupivacaine liposome  20 mL Infiltration Once  . chlorhexidine  60 mL Topical Once   Continuous Infusions: . lactated ringers    . methocarbamol (ROBAXIN)  IV    . nitroGLYCERIN    . nitroGLYCERIN     PRN Meds: HYDROmorphone (DILAUDID) injection, meperidine (DEMEROL) injection, methocarbamol **OR** methocarbamol (ROBAXIN)  IV, midazolam, promethazine  Allergies:    Allergies  Allergen Reactions  . Sulfonamide Derivatives  Itching    Social History:   Social History   Social History  . Marital status: Widowed    Spouse name: N/A  . Number of children: N/A  . Years of education: N/A   Occupational History  . Not on file.   Social History Main Topics  . Smoking status: Never Smoker  . Smokeless tobacco: Never Used  . Alcohol use No  . Drug use: No  . Sexual activity: Not on file   Other Topics Concern  . Not on file   Social History Narrative  . No narrative on file    Family History:   The patient's family history includes Hypertension in her father and mother.  ROS:  Please see the history of present illness.  ROS  All other ROS reviewed and negative.     Physical Exam/Data:   Vitals:   10/23/16 0534 10/23/16 0550 10/23/16 1030 10/23/16 1053  BP:  137/88 104/61 113/73  Pulse:  79 (!) 102   Resp:  18 (!) 23   Temp:  97.5 F (36.4 C) 97.4 F (36.3 C)   TempSrc:  Oral    SpO2:  99% (P) 100%   Weight: 254 lb (115.2 kg)     Height: 5' 3.25" (1.607 m)       Intake/Output Summary (Last 24 hours) at 10/23/16 1058 Last data filed at 10/23/16 1036  Gross per 24 hour  Intake  2750 ml  Output              450 ml  Net             2300 ml   Filed Weights   10/23/16 0534  Weight: 254 lb (115.2 kg)   Body mass index is 44.64 kg/m.  General:   Morbidly obese black female, NAD HEENT: lips are swollen  Lymph: no adenopathy Neck: no JVD Endocrine:  No thryomegaly Vascular: No carotid bruits; FA pulses 2+ bilaterally without bruits  Cardiac:  normal S1, S2;  RR, no murmurs  Lungs:  clear to auscultation bilaterally, no wheezing, rhonchi or rales  Abd: soft, nontender, no hepatomegaly  Ext: no edema Musculoskeletal:  No deformities, BUE and BLE strength normal and equal Skin: warm and dry  Neuro:  CNs 2-12 intact, no focal abnormalities noted Psych:  Normal affect   EKG:  The EKG was personally reviewed and demonstrates:   NSR at 98.  Inc. RBBB with associated ST /T  wave changes.  Telemetry:  Telemetry was personally reviewed and demonstrates:  NSR   Relevant CV Studies: Normal myoview study performed in [place]  Laboratory Data:  Chemistry  Recent Labs Lab 10/18/16 1047 10/23/16 1002  NA 138 136  K 3.0* 3.3*  CL 96*  --   CO2 33*  --   GLUCOSE 112*  --   BUN 11  --   CREATININE 1.11*  --   CALCIUM 9.2  --   GFRNONAA 51*  --   GFRAA 59*  --   ANIONGAP 9  --      Recent Labs Lab 10/18/16 1047  PROT 8.3*  ALBUMIN 4.0  AST 35  ALT 22  ALKPHOS 103  BILITOT 1.1   Hematology  Recent Labs Lab 10/18/16 1047 10/23/16 1002  WBC 6.3  --   RBC 4.46  --   HGB 11.5* 13.3  HCT 35.5* 39.0  MCV 79.6  --   MCH 25.8*  --   MCHC 32.4  --   RDW 14.5  --   PLT 189  --    Cardiac EnzymesNo results for input(s): TROPONINI in the last 168 hours. No results for input(s): TROPIPOC in the last 168 hours.  BNPNo results for input(s): BNP, PROBNP in the last 168 hours.  DDimer No results for input(s): DDIMER in the last 168 hours.  Radiology/Studies:  Dg Spine Portable 1 View  Result Date: 10/23/2016 CLINICAL DATA:  L4-5 decompression EXAM: PORTABLE SPINE - 1 VIEW COMPARISON:  None. FINDINGS: Single cross-table lateral x-ray of the lumbar spine. Posterior tissue retractors. Two metallic probes. Superior metallic probe projects along the L4-5 facet joint. More inferior metallic probe tip projects over the L5 pedicle. IMPRESSION: Intraoperative localization as described above. Electronically Signed   By: Kathreen Devoid   On: 10/23/2016 09:44   Dg Spine Portable 1 View  Result Date: 10/23/2016 CLINICAL DATA:  Intraoperative x-ray. EXAM: PORTABLE SPINE - 1 VIEW COMPARISON:  None. FINDINGS: Single cross-table lateral portable x-ray of the lumbar spine. Posterior tissue retractors. Curved tip metallic probe with the tip projecting over the L4 posterior element at the level of the transverse process. Grade 1 anterolisthesis of L4 on L5. IMPRESSION:  Intraoperative localization as described above. Electronically Signed   By: Kathreen Devoid   On: 10/23/2016 09:10   Dg Spine Portable 1 View  Result Date: 10/23/2016 CLINICAL DATA:  Intraoperative localization EXAM: PORTABLE SPINE - 1 VIEW COMPARISON:  10/23/2016, 10/18/2016  FINDINGS: posterior surgical instrument overlies the L4 spinous process directed at the L4-5 disc space. IMPRESSION: Intraoperative localization as above. Electronically Signed   By: Rolm Baptise M.D.   On: 10/23/2016 08:38   Dg Spine Portable 1 View  Result Date: 10/23/2016 CLINICAL DATA:  Lumbar decompression at L4-5 intraoperative film 1. EXAM: PORTABLE SPINE - 1 VIEW COMPARISON:  Preoperative study of October 18, 2016 FINDINGS: Numbering of the vertebral levels is in accordance with the previous x-ray. The uppermost localization needle lies 1 cm posterior to the posterosuperior margin of the L3 spinous process. The lower needle lies approximately 2 cm posterior to the posterior aspect of the L3 spinous process. IMPRESSION: Metallic localization needles posterior to the L3 spinous process. Electronically Signed   By: David  Martinique M.D.   On: 10/23/2016 08:19    Assessment and Plan:   1. ECG abnormalities and hypotension with surgery : Pt was very stable for 1 1/2 hours but then suddenly dropped her BP and developed ST depression. She received IV Neosynephrine, Albumin, esmalol, and eventually NTG. Recovered after 15 minutes.  Is currently not having any pain .  She has symptoms concerning for angina prior to surgery but had a low risk myoview in [place] prior to her procedure.  Will keep her for several days Cycle troponins, Discussed with Dr. Gladstone Lighter who would like to wait 2 days until we start plavix or ASA  or consider PCI Will anticipate starting Xarelto in 2 days.  I anticipate doing a cath on Friday .   2. HTN:   Stable     Signed, Mertie Moores, MD  10/23/2016 10:58 AM

## 2016-10-23 NOTE — H&P (Signed)
CSW consulted for SNF placement. Please consult PT when appropriate. CSW will assist with SNF placement if recommended.   Werner Lean LCSW [phone]

## 2016-10-23 NOTE — H&P (Signed)
[name] [name] is an 66 y.o. female.   Chief Complaint: Pain and weakness in Left foot and leg. HPI: She has been complaining of progressibve pain and weakness in her left leg.  Past Medical History:  Diagnosis Date  . Arthritis   . Hypertension   . MI (myocardial infarction) ([place])   . Pre-diabetes   . Stroke ([place])   . Thyroid disease     History reviewed. No pertinent surgical history.  History reviewed. No pertinent family history. Social History:  reports that she has never smoked. She has never used smokeless tobacco. She reports that she does not drink alcohol or use drugs.  Allergies:  Allergies  Allergen Reactions  . Sulfonamide Derivatives Itching    Medications Prior to Admission  Medication Sig Dispense Refill  . albuterol (PROVENTIL HFA;VENTOLIN HFA) 108 (90 BASE) MCG/ACT inhaler Inhale 2 puffs into the lungs every 6 (six) hours as needed for wheezing or shortness of breath. 1 Inhaler 2  . budesonide-formoterol (SYMBICORT) 160-4.5 MCG/ACT inhaler Inhale 2 puffs into the lungs 2 (two) times daily.    . cholecalciferol (VITAMIN D) 1000 UNITS tablet Take 2,000 Units by mouth daily.    . Cyanocobalamin (VITAMIN B-12 PO) Take 2,000 mg by mouth daily.    . diphenhydramine-acetaminophen (TYLENOL PM) 25-500 MG TABS Take 2 tablets by mouth at bedtime as needed (pain/sleep).    . docusate sodium (COLACE) 100 MG capsule Take 200 mg by mouth 2 (two) times daily as needed for mild constipation.    . fluticasone (FLONASE) 50 MCG/ACT nasal spray Place 2 sprays into both nostrils daily as needed for allergies or rhinitis.    . furosemide (LASIX) 40 MG tablet Take 40 mg by mouth 2 (two) times daily.     . Glucos-Chondroit-Hyaluron-MSM (GLUCOSAMINE CHONDROITIN JOINT PO) Take 1 tablet by mouth 2 (two) times daily.    Marland Kitchen levothyroxine (SYNTHROID, LEVOTHROID) 112 MCG tablet Take 112 mcg by mouth daily before breakfast.    . losartan-hydrochlorothiazide (HYZAAR) 100-25 MG per tablet Take 1  tablet by mouth daily. 30 tablet 0  . omega-3 acid ethyl esters (LOVAZA) 1 G capsule Take 4 g by mouth daily.    Marland Kitchen omeprazole (PRILOSEC) 20 MG capsule Take 20 mg by mouth daily.    Marland Kitchen oxyCODONE-acetaminophen (PERCOCET/ROXICET) 5-325 MG tablet Take 1 tablet by mouth every 8 (eight) hours as needed for severe pain.    . potassium chloride SA (K-DUR,KLOR-CON) 20 MEQ tablet Take 100-120 mEq by mouth See admin instructions. Pt to take 6 tablets in the morning, and 5 tablets in the evening    . rivaroxaban (XARELTO) 20 MG TABS tablet Take 20 mg by mouth every morning.    . venlafaxine XR (EFFEXOR-XR) 75 MG 24 hr capsule Take 75 mg by mouth daily with breakfast.    . Vitamin D, Ergocalciferol, (DRISDOL) 50000 units CAPS capsule Take 50,000 Units by mouth every Sunday.    Marland Kitchen CALCIUM PO Take 1 tablet by mouth daily.      Results for orders placed or performed during the hospital encounter of 10/23/16 (from the past 48 hour(s))  Glucose, capillary     Status: Abnormal   Collection Time: 10/23/16  5:18 AM  Result Value Ref Range   Glucose-Capillary 125 (H) 65 - 99 mg/dL   Comment 1 Notify RN    No results found.  Review of Systems  Constitutional: Negative.   HENT: Negative.   Eyes: Negative.   Respiratory: Negative.   Cardiovascular: Negative.  Gastrointestinal: Negative.   Genitourinary: Negative.   Musculoskeletal: Negative.   Skin: Negative.   Neurological: Positive for focal weakness.  Endo/Heme/Allergies: Negative.   Psychiatric/Behavioral: Negative.     Blood pressure 137/88, pulse 79, temperature 97.5 F (36.4 C), temperature source Oral, resp. rate 18, height 5' 3.25" (1.607 m), weight 115.2 kg (254 lb), SpO2 99 %. Physical Exam  Constitutional: She appears well-developed.  HENT:  Head: Normocephalic.  Eyes: Pupils are equal, round, and reactive to light.  Cardiovascular: Normal rate.   Respiratory: Effort normal.  GI: Soft.  Musculoskeletal: She exhibits tenderness.   Neurological:  Weakness of left foot dorsiflexors.     Assessment/Plan Central decompressive Lumbar Laminectomy and microdiscectomy at L-4-L-5  Carlei Huang A, MD 10/23/2016, 7:10 AM

## 2016-10-23 NOTE — Progress Notes (Signed)
Pt received from anesthesia, pt awake and alert, in no distress. Pt on Neo and Nitro gtt.  EKG done.  All care and charting done in conjunction with Henrietta Dine RN.

## 2016-10-24 DIAGNOSIS — I480 Paroxysmal atrial fibrillation: Secondary | ICD-10-CM

## 2016-10-24 MED ORDER — DILTIAZEM HCL-DEXTROSE 100-5 MG/100ML-% IV SOLN (PREMIX)
5.0000 mg/h | INTRAVENOUS | Status: DC
  Administered 2016-10-24: 5 mg/h via INTRAVENOUS
  Administered 2016-10-24 – 2016-10-25 (×3): 15 mg/h via INTRAVENOUS
  Administered 2016-10-26: 5 mg/h via INTRAVENOUS
  Filled 2016-10-24 (×7): qty 100

## 2016-10-24 MED ORDER — METOPROLOL TARTRATE 25 MG PO TABS
25.0000 mg | ORAL_TABLET | Freq: Two times a day (BID) | ORAL | Status: DC
  Administered 2016-10-24 – 2016-10-26 (×3): 25 mg via ORAL
  Filled 2016-10-24 (×4): qty 1

## 2016-10-24 MED ORDER — DILTIAZEM LOAD VIA INFUSION
15.0000 mg | Freq: Once | INTRAVENOUS | Status: AC
  Administered 2016-10-24: 15 mg via INTRAVENOUS
  Filled 2016-10-24: qty 15

## 2016-10-24 NOTE — Evaluation (Signed)
Occupational Therapy Evaluation Patient Details Name: [name] [name] MRN: [id number] DOB: [birth year] Today's Date: 10/24/2016    History of Present Illness s/p L4-5 decompression and foraminotomy L4.    PMH:  MI, CVA, HTN   Clinical Impression   This 66 year old female was admitted for the above sx.  She was independent with adls and will benefit from continue OT to increase safety and independence with adls, following back precautions.  Goals are for set up to min guard assist in acute setting.    Follow Up Recommendations  Supervision/Assistance - 24 hour    Equipment Recommendations  3 in 1 bedside commode    Recommendations for Other Services       Precautions / Restrictions Precautions Precautions: Back;Fall Restrictions Weight Bearing Restrictions: No      Mobility Bed Mobility Overal bed mobility: Needs Assistance Bed Mobility: Rolling;Sidelying to Sit Rolling: Min assist Sidelying to sit: Min assist;Mod assist       General bed mobility comments: multimodal cues; difficulty sequencing sidelying to sit  Transfers Overall transfer level: Needs assistance Equipment used: Rolling walker (2 wheeled) Transfers: Sit to/from [facility] Sit to Stand: Min assist Stand pivot transfers: Min assist       General transfer comment: assist to rise and steady. Cues for back precautions and UE placement    Balance                                           ADL either performed or assessed with clinical judgement   ADL Overall ADL's : Needs assistance/impaired     Grooming: Oral care;Set up;Sitting   Upper Body Bathing: Set up;Supervision/ safety;Sitting   Lower Body Bathing: Moderate assistance;Sit to/from stand   Upper Body Dressing : Set up;Supervision/safety;Sitting   Lower Body Dressing: Maximal assistance;Sit to/from stand   Toilet Transfer: Minimal assistance;Stand-pivot;RW (chair)   Toileting- Clothing  Manipulation and Hygiene: Moderate assistance;Sit to/from stand         General ADL Comments: educated on back precautions and alternative methods to safely complete these tasks. She is interested in Secondary school teacher and toilet aide and wants family member to see them when they arrive.  Pt needs a lot more practice wtih bed mobility; had difficulty sequencing it     Vision         Perception     Praxis      Pertinent Vitals/Pain Pain Assessment: 0-10 Pain Score: 3  Pain Location: back Pain Descriptors / Indicators: Aching Pain Intervention(s): Limited activity within patient's tolerance;Monitored during session;Premedicated before session;Repositioned     Hand Dominance     Extremity/Trunk Assessment Upper Extremity Assessment Upper Extremity Assessment: Generalized weakness           Communication     Cognition Arousal/Alertness: Awake/alert Behavior During Therapy: WFL for tasks assessed/performed Overall Cognitive Status: Within Functional Limits for tasks assessed                                     General Comments   Pt seen in SDU.  Had a few beats of VTach during tx.  On liter 02; sats in high 90s    Exercises     Shoulder Instructions      Home Living Family/patient expects to be discharged to:: Private residence Living  Arrangements: Alone                               Additional Comments: may stay with a son:  she has to climb a full flight of stairs; he works.  has standard commode.  her tub has grab bars.  Son also has bathtub      Prior Functioning/Environment Level of Independence: Independent                 OT Problem List: Decreased strength;Decreased activity tolerance;Decreased knowledge of precautions;Decreased knowledge of use of DME or AE;Pain;Cardiopulmonary status limiting activity      OT Treatment/Interventions: Self-care/ADL training;Energy conservation;DME and/or AE instruction;Patient/family education     OT Goals(Current goals can be found in the care plan section) Acute Rehab OT Goals Patient Stated Goal: return to independence OT Goal Formulation: With patient Time For Goal Achievement: 10/31/16 Potential to Achieve Goals: Good ADL Goals Pt Will Transfer to Toilet: with supervision;bedside commode;ambulating Additional ADL Goal #1: pt will complete bed mobility following back precautions at min guard level in preparation for adls Additional ADL Goal #2: pt will complete UB adls at set up level without cues for back precautions Additional ADL Goal #3: pt will verbalize vs demonstrate use of AE for LB adls  OT Frequency: Min 2X/week   Barriers to D/C:            Co-evaluation              AM-PAC PT "6 Clicks" Daily Activity     Outcome Measure Help from another person eating meals?: None Help from another person taking care of personal grooming?: A Little Help from another person toileting, which includes using toliet, bedpan, or urinal?: A Lot Help from another person bathing (including washing, rinsing, drying)?: A Lot Help from another person to put on and taking off regular upper body clothing?: A Little Help from another person to put on and taking off regular lower body clothing?: A Lot 6 Click Score: 16   End of Session    Activity Tolerance: Patient tolerated treatment well Patient left: in chair;with call bell/phone within reach  OT Visit Diagnosis: Muscle weakness (generalized) (M62.81)                Time: 8341-9622 OT Time Calculation (min): 28 min Charges:  OT General Charges $OT Visit: 1 Procedure OT Evaluation $OT Eval Low Complexity: 1 Procedure OT Treatments $Self Care/Home Management : 8-22 mins G-Codes:     [place], OTR/L [phone] 10/24/2016  Nayeliz Hipp 10/24/2016, 10:25 AM

## 2016-10-24 NOTE — Evaluation (Signed)
Physical Therapy Evaluation Patient Details Name: [name] [name] MRN: [id number] DOB: 1951-04-06 Today's Date: 10/24/2016   History of Present Illness  Pt is a 66 year old female s/p L4-5 decompression and foraminotomy L4.    PMH:  MI, CVA, HTN  Clinical Impression  Patient is s/p above surgery resulting in the deficits listed below (see PT Problem List).  Patient will benefit from skilled PT to increase their independence and safety with mobility (while adhering to their precautions) to allow discharge to the venue listed below.  Pt reports possible d/c home tomorrow.  Pt states she is uncertain if she will return to her apartment or go to her son's place (has 10 steps to enter).     Follow Up Recommendations No PT follow up    Equipment Recommendations  None recommended by PT    Recommendations for Other Services       Precautions / Restrictions Precautions Precautions: Back;Fall Precaution Comments: reviewed back precautions, pt had handout in room from OT Restrictions Weight Bearing Restrictions: No      Mobility  Bed Mobility Overal bed mobility: Needs Assistance Bed Mobility: Rolling;Sidelying to Sit Rolling: Min assist Sidelying to sit: Min assist;Mod assist       General bed mobility comments: pt up in recliner on arrival  Transfers Overall transfer level: Needs assistance Equipment used: Rolling walker (2 wheeled) Transfers: Sit to/from Stand Sit to Stand: Min guard Stand pivot transfers: Min assist       General transfer comment: verbal cues for maintaining precautions, min/guard for safety and lines  Ambulation/Gait Ambulation/Gait assistance: Min guard Ambulation Distance (Feet): 400 Feet Assistive device: Rolling walker (2 wheeled) Gait Pattern/deviations: Step-through pattern;Decreased stride length     General Gait Details: slow but steady pace, HR very briefly up to 160 bpm, mostly in 110's during ambulation  Stairs             Wheelchair Mobility    Modified Rankin (Stroke Patients Only)       Balance                                             Pertinent Vitals/Pain Pain Assessment: 0-10 Pain Score: 2  Pain Location: back Pain Descriptors / Indicators: Aching;Sore Pain Intervention(s): Limited activity within patient's tolerance;Monitored during session;Repositioned    Home Living Family/patient expects to be discharged to:: Private residence Living Arrangements: Alone   Type of Home: Apartment Home Access: Level entry     Home Layout: One level Home Equipment: Walker - 2 wheels Additional Comments: may stay with a son:  she has to climb a full flight of stairs; he works.  has standard commode.  her tub has grab bars.  Son also has bathtub    Prior Function Level of Independence: Independent               Hand Dominance        Extremity/Trunk Assessment   Upper Extremity Assessment Upper Extremity Assessment: Generalized weakness    Lower Extremity Assessment Lower Extremity Assessment: LLE deficits/detail LLE Deficits / Details: reports presurgerical numbness and weakness in L LE, still present at this time however improved per pt, able to perform ankle pumps       Communication   Communication: No difficulties  Cognition Arousal/Alertness: Awake/alert Behavior During Therapy: WFL for tasks assessed/performed Overall Cognitive Status: Within Functional Limits  for tasks assessed                                        General Comments      Exercises     Assessment/Plan    PT Assessment Patient needs continued PT services  PT Problem List Decreased strength;Decreased mobility;Pain;Decreased knowledge of precautions       PT Treatment Interventions Stair training;Gait training;DME instruction;Therapeutic activities;Therapeutic exercise;Functional mobility training;Patient/family education    PT Goals (Current goals can be  found in the Care Plan section)  Acute Rehab PT Goals Patient Stated Goal: return to independence PT Goal Formulation: With patient Time For Goal Achievement: 10/31/16 Potential to Achieve Goals: Good    Frequency Min 5X/week   Barriers to discharge        Co-evaluation               AM-PAC PT "6 Clicks" Daily Activity  Outcome Measure Difficulty turning over in bed (including adjusting bedclothes, sheets and blankets)?: A Little Difficulty moving from lying on back to sitting on the side of the bed? : Total Difficulty sitting down on and standing up from a chair with arms (e.g., wheelchair, bedside commode, etc,.)?: Total Help needed moving to and from a bed to chair (including a wheelchair)?: A Little Help needed walking in hospital room?: A Little Help needed climbing 3-5 steps with a railing? : A Little 6 Click Score: 14    End of Session   Activity Tolerance: Patient tolerated treatment well Patient left: in chair;with call bell/phone within reach Nurse Communication: Mobility status PT Visit Diagnosis: Difficulty in walking, not elsewhere classified (R26.2)    Time: 7680-8811 PT Time Calculation (min) (ACUTE ONLY): 16 min   Charges:   PT Evaluation $PT Eval Low Complexity: 1 Procedure     PT G CodesCarmelia Bake, PT, DPT 10/24/2016 Pager: [phone]   York Ram E 10/24/2016, 12:31 PM

## 2016-10-24 NOTE — Progress Notes (Signed)
Progress Note  Patient Name: [name] [name] Date of Encounter: 10/24/2016  Primary Cardiologist: new, Nahser  Subjective   66 yo - admitted for back surgery We were consulted when she had ST depression and hypotension during after surgery  She developed atrial fib this am She has had some palpitations at home on occasion  Inpatient Medications    Scheduled Meds: . diltiazem  15 mg Intravenous Once  . mometasone-formoterol  2 puff Inhalation BID  . venlafaxine XR  75 mg Oral Q breakfast   Continuous Infusions: . diltiazem (CARDIZEM) infusion    . methocarbamol (ROBAXIN)  IV Stopped (10/23/16 1430)   PRN Meds: acetaminophen **OR** acetaminophen, albuterol, bisacodyl, HYDROcodone-acetaminophen, HYDROmorphone (DILAUDID) injection, menthol-cetylpyridinium **OR** phenol, methocarbamol **OR** methocarbamol (ROBAXIN)  IV, ondansetron **OR** ondansetron (ZOFRAN) IV, oxyCODONE-acetaminophen, polyethylene glycol, sodium phosphate   Vital Signs    Vitals:   10/24/16 1300 10/24/16 1330 10/24/16 1334 10/24/16 1335  BP:   (!) 176/104   Pulse: 79 92 (!) 136 (!) 125  Resp:      Temp:      TempSrc:      SpO2: 92% 95% 99% 99%  Weight:      Height:        Intake/Output Summary (Last 24 hours) at 10/24/16 1409 Last data filed at 10/24/16 0623  Gross per 24 hour  Intake             2250 ml  Output             1000 ml  Net             1250 ml   Filed Weights   10/23/16 0534 10/23/16 1230  Weight: 254 lb (115.2 kg) 253 lb 15.5 oz (115.2 kg)    Telemetry    Atrial fib with RVR - Personally Reviewed  ECG    NSR at 90,  NS ST abn.  - Personally Reviewed  Physical Exam   GEN: No acute distress.   Neck: No JVD Cardiac: RRR, no murmurs, rubs, or gallops.  Respiratory: Clear to auscultation bilaterally. GI: Soft, nontender, non-distended  MS: No edema; No deformity. Neuro:  Nonfocal  Psych: Normal affect   Labs    Chemistry Recent Labs Lab 10/18/16 1047  10/23/16 1002  NA 138 136  K 3.0* 3.3*  CL 96*  --   CO2 33*  --   GLUCOSE 112*  --   BUN 11  --   CREATININE 1.11*  --   CALCIUM 9.2  --   PROT 8.3*  --   ALBUMIN 4.0  --   AST 35  --   ALT 22  --   ALKPHOS 103  --   BILITOT 1.1  --   GFRNONAA 51*  --   GFRAA 59*  --   ANIONGAP 9  --      Hematology Recent Labs Lab 10/18/16 1047 10/23/16 1002  WBC 6.3  --   RBC 4.46  --   HGB 11.5* 13.3  HCT 35.5* 39.0  MCV 79.6  --   MCH 25.8*  --   MCHC 32.4  --   RDW 14.5  --   PLT 189  --     Cardiac Enzymes Recent Labs Lab 10/23/16 1242 10/23/16 1752 10/23/16 2311  TROPONINI 0.03* 0.04* 0.03*   No results for input(s): TROPIPOC in the last 168 hours.   BNPNo results for input(s): BNP, PROBNP in the last 168 hours.   DDimer No  results for input(s): DDIMER in the last 168 hours.   Radiology    Dg Spine Portable 1 View  Result Date: 10/23/2016 CLINICAL DATA:  L4-5 decompression EXAM: PORTABLE SPINE - 1 VIEW COMPARISON:  None. FINDINGS: Single cross-table lateral x-ray of the lumbar spine. Posterior tissue retractors. Two metallic probes. Superior metallic probe projects along the L4-5 facet joint. More inferior metallic probe tip projects over the L5 pedicle. IMPRESSION: Intraoperative localization as described above. Electronically Signed   By: Kathreen Devoid   On: 10/23/2016 09:44   Dg Spine Portable 1 View  Result Date: 10/23/2016 CLINICAL DATA:  Intraoperative x-ray. EXAM: PORTABLE SPINE - 1 VIEW COMPARISON:  None. FINDINGS: Single cross-table lateral portable x-ray of the lumbar spine. Posterior tissue retractors. Curved tip metallic probe with the tip projecting over the L4 posterior element at the level of the transverse process. Grade 1 anterolisthesis of L4 on L5. IMPRESSION: Intraoperative localization as described above. Electronically Signed   By: Kathreen Devoid   On: 10/23/2016 09:10   Dg Spine Portable 1 View  Result Date: 10/23/2016 CLINICAL DATA:   Intraoperative localization EXAM: PORTABLE SPINE - 1 VIEW COMPARISON:  10/23/2016, 10/18/2016 FINDINGS: posterior surgical instrument overlies the L4 spinous process directed at the L4-5 disc space. IMPRESSION: Intraoperative localization as above. Electronically Signed   By: Rolm Baptise M.D.   On: 10/23/2016 08:38   Dg Spine Portable 1 View  Result Date: 10/23/2016 CLINICAL DATA:  Lumbar decompression at L4-5 intraoperative film 1. EXAM: PORTABLE SPINE - 1 VIEW COMPARISON:  Preoperative study of October 18, 2016 FINDINGS: Numbering of the vertebral levels is in accordance with the previous x-ray. The uppermost localization needle lies 1 cm posterior to the posterosuperior margin of the L3 spinous process. The lower needle lies approximately 2 cm posterior to the posterior aspect of the L3 spinous process. IMPRESSION: Metallic localization needles posterior to the L3 spinous process. Electronically Signed   By: David  Martinique M.D.   On: 10/23/2016 08:19    Cardiac Studies     Patient Profile     66 y.o. female admitted for back surgery  Had ST depression and hypotension with surgery  Now has developed atrial fib  Assessment & Plan    1.  ST depression and hypotension:   troponins are unremarkable. Scheduled for cath tomorrow  Discussed the risks, benefits, options. She understands and agrees to proceed.  2. Atrial fib:  With RVR.   Will start dilt drip. She has palpitations on occasion.   Will not cardiovert since it sound like she may have had some in the past.   She is asymptomatic currently . Will hopefully convert with Cardizem drip   Signed, Mertie Moores, MD  10/24/2016, 2:09 PM

## 2016-10-24 NOTE — Progress Notes (Signed)
Subjective: 1 Day Post-Op Procedure(s) (LRB): LUMBAR LAMINECTOMY/DECOMPRESSION MICRODISCECTOMY (N/A) Patient reports pain as 0 on 0-10 scale.  No further Leg pain. She is remarkably better following surgery.. No major cardiac issues post-Op Troponin (Point of Care Test),.03. Foley cath. Is out and no problem with urination. She has been up ambulating.Plan od DC Friday,providing she is cleared by Cardiology. Objective: Vital signs in last 24 hours: Temp:  [97.4 F (36.3 C)-98.7 F (37.1 C)] 98.6 F (37 C) (06/28 0300) Pulse Rate:  [85-102] 91 (06/28 0600) Resp:  [0-25] 18 (06/28 0600) BP: (57-145)/(32-75) 128/53 (06/28 0600) SpO2:  [84 %-100 %] 97 % (06/28 0600) Weight:  [115.2 kg (253 lb 15.5 oz)] 115.2 kg (253 lb 15.5 oz) (06/27 1230)  Intake/Output from previous day: 06/27 0701 - 06/28 0700 In: 5701.7 [P.O.:1005; I.V.:4291.7; IV Piggyback:405] Out: 3846 [Urine:1250; Blood:300] Intake/Output this shift: No intake/output data recorded.   Recent Labs  10/23/16 1002  HGB 13.3    Recent Labs  10/23/16 1002  HCT 39.0    Recent Labs  10/23/16 1002  NA 136  K 3.3*   No results for input(s): LABPT, INR in the last 72 hours.  Neurologically intact Dorsiflexion/Plantar flexion intact  Assessment/Plan: 1 Day Post-Op Procedure(s) (LRB): LUMBAR LAMINECTOMY/DECOMPRESSION MICRODISCECTOMY (N/A) Up with therapy discontinued tomorrow if cleared by Cardiology  Vuk Skillern A 10/24/2016, 7:16 AM

## 2016-10-25 ENCOUNTER — Ambulatory Visit (HOSPITAL_COMMUNITY)
Admission: RE | Admit: 2016-10-25 | Payer: Medicare Other | Source: Ambulatory Visit | Admitting: Interventional Cardiology

## 2016-10-25 ENCOUNTER — Encounter (HOSPITAL_COMMUNITY)
Admission: AD | Disposition: A | Discharge status: Home Health | Payer: Self-pay | Source: Ambulatory Visit | Attending: Orthopedic Surgery

## 2016-10-25 DIAGNOSIS — D649 Anemia, unspecified: Secondary | ICD-10-CM

## 2016-10-25 DIAGNOSIS — I959 Hypotension, unspecified: Secondary | ICD-10-CM

## 2016-10-25 DIAGNOSIS — R9431 Abnormal electrocardiogram [ECG] [EKG]: Secondary | ICD-10-CM | POA: Diagnosis not present

## 2016-10-25 HISTORY — PX: ULTRASOUND GUIDANCE FOR VASCULAR ACCESS: SHX6516

## 2016-10-25 HISTORY — PX: LEFT HEART CATH AND CORONARY ANGIOGRAPHY: CATH118249

## 2016-10-25 LAB — CBC
HCT: 28.2 % — ABNORMAL LOW (ref 36.0–46.0)
Hemoglobin: 9 g/dL — ABNORMAL LOW (ref 12.0–15.0)
MCH: 25.8 pg — ABNORMAL LOW (ref 26.0–34.0)
MCHC: 31.9 g/dL (ref 30.0–36.0)
MCV: 80.8 fL (ref 78.0–100.0)
Platelets: 142 10*3/uL — ABNORMAL LOW (ref 150–400)
RBC: 3.49 MIL/uL — ABNORMAL LOW (ref 3.87–5.11)
RDW: 14.9 % (ref 11.5–15.5)
WBC: 11 10*3/uL — AB (ref 4.0–10.5)

## 2016-10-25 LAB — BASIC METABOLIC PANEL
ANION GAP: 8 (ref 5–15)
BUN: 17 mg/dL (ref 6–20)
CALCIUM: 8.7 mg/dL — AB (ref 8.9–10.3)
CO2: 30 mmol/L (ref 22–32)
CREATININE: 1.01 mg/dL — AB (ref 0.44–1.00)
Chloride: 104 mmol/L (ref 101–111)
GFR, EST NON AFRICAN AMERICAN: 57 mL/min — AB (ref >60)
Glucose, Bld: 126 mg/dL — ABNORMAL HIGH (ref 65–99)
Potassium: 3.7 mmol/L (ref 3.5–5.1)
SODIUM: 142 mmol/L (ref 135–145)

## 2016-10-25 SURGERY — LEFT HEART CATH AND CORONARY ANGIOGRAPHY
Anesthesia: LOCAL

## 2016-10-25 MED ORDER — MIDAZOLAM HCL 2 MG/2ML IJ SOLN
INTRAMUSCULAR | Status: DC | PRN
  Administered 2016-10-25: 1 mg via INTRAVENOUS

## 2016-10-25 MED ORDER — SODIUM CHLORIDE 0.9 % WEIGHT BASED INFUSION
1.0000 mL/kg/h | INTRAVENOUS | Status: DC
  Administered 2016-10-25: 1 mL/kg/h via INTRAVENOUS

## 2016-10-25 MED ORDER — ASPIRIN 81 MG PO CHEW
81.0000 mg | CHEWABLE_TABLET | ORAL | Status: AC
  Administered 2016-10-25: 81 mg via ORAL
  Filled 2016-10-25: qty 1

## 2016-10-25 MED ORDER — FENTANYL CITRATE (PF) 100 MCG/2ML IJ SOLN
INTRAMUSCULAR | Status: DC | PRN
Start: 2016-10-25 — End: 2016-10-25
  Administered 2016-10-25: 50 ug via INTRAVENOUS

## 2016-10-25 MED ORDER — SODIUM CHLORIDE 0.9% FLUSH: 3.0000 mL | INTRAVENOUS | Status: DC | PRN

## 2016-10-25 MED ORDER — SODIUM CHLORIDE 0.9 % IV SOLN: 250.0000 mL | INTRAVENOUS | Status: DC | PRN

## 2016-10-25 MED ORDER — ONDANSETRON HCL 4 MG/2ML IJ SOLN: 4.0000 mg | Freq: Four times a day (QID) | INTRAMUSCULAR | Status: DC | PRN

## 2016-10-25 MED ORDER — VERAPAMIL HCL 2.5 MG/ML IV SOLN
INTRAVENOUS | Status: DC | PRN
  Administered 2016-10-25: 3 mL via INTRA_ARTERIAL

## 2016-10-25 MED ORDER — SODIUM CHLORIDE 0.9 % WEIGHT BASED INFUSION: 3.0000 mL/kg/h | INTRAVENOUS | Status: DC

## 2016-10-25 MED ORDER — ASPIRIN 81 MG PO CHEW: 81.0000 mg | CHEWABLE_TABLET | ORAL | Status: DC

## 2016-10-25 MED ORDER — SODIUM CHLORIDE 0.9% FLUSH
3.0000 mL | Freq: Two times a day (BID) | INTRAVENOUS | Status: DC
  Administered 2016-10-26 – 2016-10-27 (×2): 3 mL via INTRAVENOUS

## 2016-10-25 MED ORDER — HEPARIN (PORCINE) IN NACL 2-0.9 UNIT/ML-% IJ SOLN
INTRAMUSCULAR | Status: AC | PRN
  Administered 2016-10-25: 1000 mL

## 2016-10-25 MED ORDER — SODIUM CHLORIDE 0.9 % IV SOLN: INTRAVENOUS | Status: AC

## 2016-10-25 MED ORDER — HEPARIN (PORCINE) IN NACL 100-0.45 UNIT/ML-% IJ SOLN
1000.0000 [IU]/h | INTRAMUSCULAR | Status: DC
  Administered 2016-10-26: 1000 [IU]/h via INTRAVENOUS
  Filled 2016-10-25 (×2): qty 250

## 2016-10-25 MED ORDER — METHOCARBAMOL 500 MG PO TABS: 500.0000 mg | ORAL_TABLET | Freq: Four times a day (QID) | ORAL | 0 refills | Status: DC | PRN

## 2016-10-25 MED ORDER — HYDROCODONE-ACETAMINOPHEN 10-325 MG PO TABS: 1.0000 | ORAL_TABLET | ORAL | 0 refills | Status: DC | PRN

## 2016-10-25 MED ORDER — ACETAMINOPHEN 325 MG PO TABS: 650.0000 mg | ORAL_TABLET | ORAL | Status: DC | PRN

## 2016-10-25 MED ORDER — HEPARIN SODIUM (PORCINE) 1000 UNIT/ML IJ SOLN
INTRAMUSCULAR | Status: DC | PRN
  Administered 2016-10-25: 5000 [IU] via INTRAVENOUS

## 2016-10-25 MED ORDER — SODIUM CHLORIDE 0.9 % WEIGHT BASED INFUSION: 1.0000 mL/kg/h | INTRAVENOUS | Status: DC

## 2016-10-25 MED ORDER — LIDOCAINE HCL (PF) 1 % IJ SOLN
INTRAMUSCULAR | Status: DC | PRN
  Administered 2016-10-25: 2 mL

## 2016-10-25 MED ORDER — SODIUM CHLORIDE 0.9% FLUSH: 3.0000 mL | Freq: Two times a day (BID) | INTRAVENOUS | Status: DC

## 2016-10-25 MED ORDER — SODIUM CHLORIDE 0.9 % WEIGHT BASED INFUSION
3.0000 mL/kg/h | INTRAVENOUS | Status: DC
  Administered 2016-10-25: 3 mL/kg/h via INTRAVENOUS

## 2016-10-25 MED ORDER — IOPAMIDOL (ISOVUE-370) INJECTION 76%
INTRAVENOUS | Status: DC | PRN
Start: 2016-10-25 — End: 2016-10-25
  Administered 2016-10-25: 75 mL via INTRA_ARTERIAL

## 2016-10-25 SURGICAL SUPPLY — 15 items
CATH EXPO 5F FL3.5 (CATHETERS) ×1 IMPLANT
CATH INFINITI JR4 5F (CATHETERS) ×1 IMPLANT
COVER PRB 48X5XTLSCP FOLD TPE (BAG) IMPLANT
COVER PROBE 5X48 (BAG) ×3
DEVICE RAD COMP TR BAND LRG (VASCULAR PRODUCTS) ×1 IMPLANT
GLIDESHEATH SLEND A-KIT 6F 22G (SHEATH) ×1 IMPLANT
GLIDESHEATH SLEND SS 6F .021 (SHEATH) IMPLANT
GUIDEWIRE INQWIRE 1.5J.035X260 (WIRE) IMPLANT
HOVERMATT SINGLE USE (MISCELLANEOUS) ×1 IMPLANT
INQWIRE 1.5J .035X260CM (WIRE) ×3
KIT HEART LEFT (KITS) ×3 IMPLANT
PACK CARDIAC CATHETERIZATION (CUSTOM PROCEDURE TRAY) ×3 IMPLANT
TRANSDUCER W/STOPCOCK (MISCELLANEOUS) ×3 IMPLANT
TUBING CIL FLEX 10 FLL-RA (TUBING) ×3 IMPLANT
WIRE HI TORQ VERSACORE-J 145CM (WIRE) ×1 IMPLANT

## 2016-10-25 NOTE — Progress Notes (Signed)
Occupational Therapy Treatment Patient Details Name: [name] [name] MRN: [id number] DOB: 1950-05-20 Today's Date: 10/25/2016    History of present illness Pt is a 66 year old female s/p L4-5 decompression and foraminotomy L4.    PMH:  MI, CVA, HTN   OT comments  Plan is for cardiac catheterization later today.  Performed SPT to 3:1 and reviewed back precautions.  HR 85-128  Follow Up Recommendations  Supervision/Assistance - 24 hour    Equipment Recommendations  3 in 1 bedside commode    Recommendations for Other Services      Precautions / Restrictions Precautions Precautions: Back;Fall Restrictions Weight Bearing Restrictions: No       Mobility Bed Mobility     Rolling: Min assist Sidelying to sit: Min assist   Sit to supine: Mod assist   General bed mobility comments: increased assistance for LE's. Cues for sequence  Transfers   Equipment used: 1 person hand held assist   Sit to Stand: Min assist         General transfer comment: pt's RUE sore; provided hand held assist    Balance                                           ADL either performed or assessed with clinical judgement   ADL Overall ADL's : Needs assistance/impaired                         Toilet Transfer: Minimal assistance;Stand-pivot   Toileting- Clothing Manipulation and Hygiene: Minimal assistance;Sit to/from stand (front peri area)         General ADL Comments: pt's HR 85-128 during SPT.  Pt has a lot of difficulty with sit to sidelying.  Reviewed back precautions     Vision       Perception     Praxis      Cognition Arousal/Alertness: Awake/alert Behavior During Therapy: WFL for tasks assessed/performed Overall Cognitive Status: Within Functional Limits for tasks assessed                                          Exercises     Shoulder Instructions       General Comments      Pertinent Vitals/ Pain       Pain  Assessment: 0-10 Pain Score: 4  Pain Location: back Pain Descriptors / Indicators: Shooting Pain Intervention(s): Limited activity within patient's tolerance;Monitored during session;Repositioned  Home Living                                          Prior Functioning/Environment              Frequency  Min 2X/week        Progress Toward Goals  OT Goals(current goals can now be found in the care plan section)  Progress towards OT goals: Progressing toward goals  Acute Rehab OT Goals Patient Stated Goal: return to independence OT Goal Formulation: With patient Time For Goal Achievement: 10/31/16  Plan      Co-evaluation                 AM-PAC PT "  6 Clicks" Daily Activity     Outcome Measure   Help from another person eating meals?: None Help from another person taking care of personal grooming?: A Little Help from another person toileting, which includes using toliet, bedpan, or urinal?: A Lot Help from another person bathing (including washing, rinsing, drying)?: A Lot Help from another person to put on and taking off regular upper body clothing?: A Little Help from another person to put on and taking off regular lower body clothing?: A Lot 6 Click Score: 16    End of Session    OT Visit Diagnosis: Muscle weakness (generalized) (M62.81)   Activity Tolerance Patient tolerated treatment well   Patient Left in bed;with call bell/phone within reach;with bed alarm set   Nurse Communication          Time: 6295-2841 OT Time Calculation (min): 16 min  Charges: OT General Charges $OT Visit: 1 Procedure OT Treatments $Self Care/Home Management : 8-22 mins  Lesle Chris, OTR/L [phone] 10/25/2016   [place] 10/25/2016, 10:27 AM

## 2016-10-25 NOTE — Progress Notes (Addendum)
2 cc removed from TR band-8cc remain-site is stable-level zero.[place] removed from TR band--5cc remain-site is stable-level zero @1710  3cc removed from TR band-2cc remain-site remains stable-level zero @ 1725 TR band is empty at 1740-site remains level zero. TR band removed, dressing applied. Level zero @1810  1825 to [facility] via [place]

## 2016-10-25 NOTE — Discharge Instructions (Signed)
For the first three days, remove your dressing, tape a piece of saran wrap over your incision °Take your shower, then remove the saran wrap and put a clean dressing on. °After three days you can shower without the saran wrap.  °No lifting or bending. °No driving while taking pain medications. °Walk with your walker. °Call Dr. Gioffre if any wound complications or temperature of 101 degrees F or over.  °Call the office for an appointment to see Dr. Gioffre in two weeks: [phone] and ask for Dr. Gioffre's nurse, Tammy Johnson.  °

## 2016-10-25 NOTE — H&P (View-Only) (Signed)
Progress Note  Patient Name: [name] [name] Date of Encounter: 10/24/2016  Primary Cardiologist: new, Salih Williamson  Subjective   66 yo - admitted for back surgery We were consulted when she had ST depression and hypotension during after surgery  She developed atrial fib this am She has had some palpitations at home on occasion  Inpatient Medications    Scheduled Meds: . diltiazem  15 mg Intravenous Once  . mometasone-formoterol  2 puff Inhalation BID  . venlafaxine XR  75 mg Oral Q breakfast   Continuous Infusions: . diltiazem (CARDIZEM) infusion    . methocarbamol (ROBAXIN)  IV Stopped (10/23/16 1430)   PRN Meds: acetaminophen **OR** acetaminophen, albuterol, bisacodyl, HYDROcodone-acetaminophen, HYDROmorphone (DILAUDID) injection, menthol-cetylpyridinium **OR** phenol, methocarbamol **OR** methocarbamol (ROBAXIN)  IV, ondansetron **OR** ondansetron (ZOFRAN) IV, oxyCODONE-acetaminophen, polyethylene glycol, sodium phosphate   Vital Signs    Vitals:   10/24/16 1300 10/24/16 1330 10/24/16 1334 10/24/16 1335  BP:   (!) 176/104   Pulse: 79 92 (!) 136 (!) 125  Resp:      Temp:      TempSrc:      SpO2: 92% 95% 99% 99%  Weight:      Height:        Intake/Output Summary (Last 24 hours) at 10/24/16 1409 Last data filed at 10/24/16 7062  Gross per 24 hour  Intake             2250 ml  Output             1000 ml  Net             1250 ml   Filed Weights   10/23/16 0534 10/23/16 1230  Weight: 254 lb (115.2 kg) 253 lb 15.5 oz (115.2 kg)    Telemetry    Atrial fib with RVR - Personally Reviewed  ECG    NSR at 90,  NS ST abn.  - Personally Reviewed  Physical Exam   GEN: No acute distress.   Neck: No JVD Cardiac: RRR, no murmurs, rubs, or gallops.  Respiratory: Clear to auscultation bilaterally. GI: Soft, nontender, non-distended  MS: No edema; No deformity. Neuro:  Nonfocal  Psych: Normal affect   Labs    Chemistry Recent Labs Lab 10/18/16 1047  10/23/16 1002  NA 138 136  K 3.0* 3.3*  CL 96*  --   CO2 33*  --   GLUCOSE 112*  --   BUN 11  --   CREATININE 1.11*  --   CALCIUM 9.2  --   PROT 8.3*  --   ALBUMIN 4.0  --   AST 35  --   ALT 22  --   ALKPHOS 103  --   BILITOT 1.1  --   GFRNONAA 51*  --   GFRAA 59*  --   ANIONGAP 9  --      Hematology Recent Labs Lab 10/18/16 1047 10/23/16 1002  WBC 6.3  --   RBC 4.46  --   HGB 11.5* 13.3  HCT 35.5* 39.0  MCV 79.6  --   MCH 25.8*  --   MCHC 32.4  --   RDW 14.5  --   PLT 189  --     Cardiac Enzymes Recent Labs Lab 10/23/16 1242 10/23/16 1752 10/23/16 2311  TROPONINI 0.03* 0.04* 0.03*   No results for input(s): TROPIPOC in the last 168 hours.   BNPNo results for input(s): BNP, PROBNP in the last 168 hours.   DDimer No  results for input(s): DDIMER in the last 168 hours.   Radiology    Dg Spine Portable 1 View  Result Date: 10/23/2016 CLINICAL DATA:  L4-5 decompression EXAM: PORTABLE SPINE - 1 VIEW COMPARISON:  None. FINDINGS: Single cross-table lateral x-ray of the lumbar spine. Posterior tissue retractors. Two metallic probes. Superior metallic probe projects along the L4-5 facet joint. More inferior metallic probe tip projects over the L5 pedicle. IMPRESSION: Intraoperative localization as described above. Electronically Signed   By: Kathreen Devoid   On: 10/23/2016 09:44   Dg Spine Portable 1 View  Result Date: 10/23/2016 CLINICAL DATA:  Intraoperative x-ray. EXAM: PORTABLE SPINE - 1 VIEW COMPARISON:  None. FINDINGS: Single cross-table lateral portable x-ray of the lumbar spine. Posterior tissue retractors. Curved tip metallic probe with the tip projecting over the L4 posterior element at the level of the transverse process. Grade 1 anterolisthesis of L4 on L5. IMPRESSION: Intraoperative localization as described above. Electronically Signed   By: Kathreen Devoid   On: 10/23/2016 09:10   Dg Spine Portable 1 View  Result Date: 10/23/2016 CLINICAL DATA:   Intraoperative localization EXAM: PORTABLE SPINE - 1 VIEW COMPARISON:  10/23/2016, 10/18/2016 FINDINGS: posterior surgical instrument overlies the L4 spinous process directed at the L4-5 disc space. IMPRESSION: Intraoperative localization as above. Electronically Signed   By: Rolm Baptise M.D.   On: 10/23/2016 08:38   Dg Spine Portable 1 View  Result Date: 10/23/2016 CLINICAL DATA:  Lumbar decompression at L4-5 intraoperative film 1. EXAM: PORTABLE SPINE - 1 VIEW COMPARISON:  Preoperative study of October 18, 2016 FINDINGS: Numbering of the vertebral levels is in accordance with the previous x-ray. The uppermost localization needle lies 1 cm posterior to the posterosuperior margin of the L3 spinous process. The lower needle lies approximately 2 cm posterior to the posterior aspect of the L3 spinous process. IMPRESSION: Metallic localization needles posterior to the L3 spinous process. Electronically Signed   By: David  Martinique M.D.   On: 10/23/2016 08:19    Cardiac Studies     Patient Profile     66 y.o. female admitted for back surgery  Had ST depression and hypotension with surgery  Now has developed atrial fib  Assessment & Plan    1.  ST depression and hypotension:   troponins are unremarkable. Scheduled for cath tomorrow  Discussed the risks, benefits, options. She understands and agrees to proceed.  2. Atrial fib:  With RVR.   Will start dilt drip. She has palpitations on occasion.   Will not cardiovert since it sound like she may have had some in the past.   She is asymptomatic currently . Will hopefully convert with Cardizem drip   Signed, Mertie Moores, MD  10/24/2016, 2:09 PM

## 2016-10-25 NOTE — Interval H&P Note (Signed)
History and Physical Interval Note:  10/25/2016 7:12 AM Cath Lab Visit (complete for each Cath Lab visit)  Clinical Evaluation Leading to the Procedure:   ACS: Yes.    Non-ACS:    Anginal Classification: CCS [name]  Anti-ischemic medical therapy: No Therapy  Non-Invasive Test Results: No non-invasive testing performed  Prior CABG: No previous CABG       [name] [name]  has presented today for surgery, with the diagnosis of hp  The various methods of treatment have been discussed with the patient and family. After consideration of risks, benefits and other options for treatment, the patient has consented to  Procedure(s): Left Heart Cath and Coronary Angiography (N/A) as a surgical intervention .  The patient's history has been reviewed, patient examined, no change in status, stable for surgery.  I have reviewed the patient's chart and labs.  Questions were answered to the patient's satisfaction.     [name] [name]

## 2016-10-25 NOTE — Progress Notes (Signed)
ANTICOAGULATION CONSULT NOTE - Initial Consult  Pharmacy Consult for heparin Indication: atrial fibrillation  Allergies  Allergen Reactions  . Sulfonamide Derivatives Itching    Patient Measurements: Height: 5\' 3"  (160 cm) Weight: 253 lb 15.5 oz (115.2 kg) IBW/kg (Calculated) : 52.4 Heparin Dosing Weight: 80kg  Vital Signs: Temp: 98.7 F (37.1 C) (06/29 1200) Temp Source: Oral (06/29 1200) BP: 120/66 (06/29 1630) Pulse Rate: 99 (06/29 1630)  Labs:  Recent Labs  10/23/16 1002 10/23/16 1242 10/23/16 1752 10/23/16 2311 10/25/16 0514  HGB 13.3  --   --   --  9.0*  HCT 39.0  --   --   --  28.2*  PLT  --   --   --   --  142*  CREATININE  --   --   --   --  1.01*  TROPONINI  --  0.03* 0.04* 0.03*  --     Estimated Creatinine Clearance: 67 mL/min (A) (by C-G formula based on SCr of 1.01 mg/dL (H)).   Medical History: Past Medical History:  Diagnosis Date  . Arthritis   . Hypertension   . MI (myocardial infarction) ([place])   . Pre-diabetes   . Stroke ([place])   . Thyroid disease     Medications:  Xarelto 20mg  daily  Assessment: 66 year old female s/p cath today, found to have nonobstructive CAD. Patient was on xarelto prior to admit for afib, per cardiology will restart heparin tonight, possibly restart xarelto in am.  Goal of Therapy:  Heparin level 0.3-0.7 units/ml Monitor platelets by anticoagulation protocol: Yes   Plan:  Start heparin infusion at 1000 units/hr Check anti-Xa level in 6 hours and daily while on heparin Continue to monitor H&H and platelets  Erin Hearing PharmD., BCPS Clinical Pharmacist Pager [phone] 10/25/2016 4:48 PM

## 2016-10-25 NOTE — Progress Notes (Signed)
PT Cancellation Note  Patient Details Name: [name] [name] MRN: [id number] DOB: 03/27/1951   Cancelled Treatment:    Reason Eval/Treat Not Completed: Other (comment) Pt appears hesitant to mobilize today pending cardiac cath today.  Pt also started RN needed to hang new medicine.  Will check back as schedule permits.   Atha Mcbain,KATHrine E 10/25/2016, 9:33 AM Carmelia Bake, PT, DPT 10/25/2016 Pager: [phone]

## 2016-10-25 NOTE — Progress Notes (Signed)
Progress Note  Patient Name: [name] [name] Date of Encounter: 10/25/2016  Primary Cardiologist: Dr. Geraldo Pitter Thompson Grayer), New to Dr. Acie Fredrickson at [facility].  Subjective   [name] is scheduled for cardiac catheterization today. She had a lumbar laminectomy/decompression done on 6/27, she is doing well after surgery. Her pre op hemoglobin was 11.5 and this morning it has dropped to 9.0. She denies any s/sx of bleeding. Denies having bleeding hx. Has had anemia in the past.  Still in rate controlled atrial fibrillation  Inpatient Medications    Scheduled Meds: . metoprolol tartrate  25 mg Oral BID  . mometasone-formoterol  2 puff Inhalation BID  . sodium chloride flush  3 mL Intravenous Q12H  . venlafaxine XR  75 mg Oral Q breakfast   Continuous Infusions: . sodium chloride    . sodium chloride    . diltiazem (CARDIZEM) infusion 15 mg/hr (10/25/16 0400)  . methocarbamol (ROBAXIN)  IV Stopped (10/23/16 1430)   PRN Meds: sodium chloride, acetaminophen **OR** acetaminophen, albuterol, bisacodyl, HYDROcodone-acetaminophen, HYDROmorphone (DILAUDID) injection, menthol-cetylpyridinium **OR** phenol, methocarbamol **OR** methocarbamol (ROBAXIN)  IV, ondansetron **OR** ondansetron (ZOFRAN) IV, oxyCODONE-acetaminophen, polyethylene glycol, sodium chloride flush, sodium phosphate   Vital Signs    Vitals:   10/25/16 0326 10/25/16 0400 10/25/16 0500 10/25/16 0604  BP:  107/72 115/63 116/62  Pulse:  88 81 95  Resp:  20 19 (!) 25  Temp: 98.2 F (36.8 C)     TempSrc: Oral     SpO2:  (!) 89% 91% 92%  Weight:      Height:        Intake/Output Summary (Last 24 hours) at 10/25/16 0807 Last data filed at 10/25/16 0400  Gross per 24 hour  Intake           453.58 ml  Output              450 ml  Net             3.58 ml   Filed Weights   10/23/16 0534 10/23/16 1230  Weight: 254 lb (115.2 kg) 253 lb 15.5 oz (115.2 kg)    Telemetry    Rate controlled atrial fibrillation - Personally  Reviewed   Physical Exam   GEN: Well nourished, well developed HEENT: normal  Neck: no JVD, carotid bruits, or masses Cardiac: irreg irreg. no murmurs, rubs, or gallops,no edema. Intact distal pulses bilaterally.  Respiratory: clear to auscultation bilaterally, normal work of breathing GI: soft, nontender, nondistended, + BS MS: no deformity or atrophy  Skin: warm and dry, no rash Neuro: Alert and Oriented x 3, Strength and sensation are intact Psych:   Full affect  Labs    Chemistry Recent Labs Lab 10/18/16 1047 10/23/16 1002 10/25/16 0514  NA 138 136 142  K 3.0* 3.3* 3.7  CL 96*  --  104  CO2 33*  --  30  GLUCOSE 112*  --  126*  BUN 11  --  17  CREATININE 1.11*  --  1.01*  CALCIUM 9.2  --  8.7*  PROT 8.3*  --   --   ALBUMIN 4.0  --   --   AST 35  --   --   ALT 22  --   --   ALKPHOS 103  --   --   BILITOT 1.1  --   --   GFRNONAA 51*  --  57*  GFRAA 59*  --  >60  ANIONGAP 9  --  8  Hematology Recent Labs Lab 10/18/16 1047 10/23/16 1002 10/25/16 0514  WBC 6.3  --  11.0*  RBC 4.46  --  3.49*  HGB 11.5* 13.3 9.0*  HCT 35.5* 39.0 28.2*  MCV 79.6  --  80.8  MCH 25.8*  --  25.8*  MCHC 32.4  --  31.9  RDW 14.5  --  14.9  PLT 189  --  142*    Cardiac Enzymes Recent Labs Lab 10/23/16 1242 10/23/16 1752 10/23/16 2311  TROPONINI 0.03* 0.04* 0.03*   No results for input(s): TROPIPOC in the last 168 hours.   BNPNo results for input(s): BNP, PROBNP in the last 168 hours.   DDimer No results for input(s): DDIMER in the last 168 hours.   Radiology    Dg Spine Portable 1 View  Result Date: 10/23/2016 CLINICAL DATA:  L4-5 decompression EXAM: PORTABLE SPINE - 1 VIEW COMPARISON:  None. FINDINGS: Single cross-table lateral x-ray of the lumbar spine. Posterior tissue retractors. Two metallic probes. Superior metallic probe projects along the L4-5 facet joint. More inferior metallic probe tip projects over the L5 pedicle. IMPRESSION: Intraoperative  localization as described above. Electronically Signed   By: Kathreen Devoid   On: 10/23/2016 09:44   Dg Spine Portable 1 View  Result Date: 10/23/2016 CLINICAL DATA:  Intraoperative x-ray. EXAM: PORTABLE SPINE - 1 VIEW COMPARISON:  None. FINDINGS: Single cross-table lateral portable x-ray of the lumbar spine. Posterior tissue retractors. Curved tip metallic probe with the tip projecting over the L4 posterior element at the level of the transverse process. Grade 1 anterolisthesis of L4 on L5. IMPRESSION: Intraoperative localization as described above. Electronically Signed   By: Kathreen Devoid   On: 10/23/2016 09:10   Dg Spine Portable 1 View  Result Date: 10/23/2016 CLINICAL DATA:  Intraoperative localization EXAM: PORTABLE SPINE - 1 VIEW COMPARISON:  10/23/2016, 10/18/2016 FINDINGS: posterior surgical instrument overlies the L4 spinous process directed at the L4-5 disc space. IMPRESSION: Intraoperative localization as above. Electronically Signed   By: Rolm Baptise M.D.   On: 10/23/2016 08:38   Dg Spine Portable 1 View  Result Date: 10/23/2016 CLINICAL DATA:  Lumbar decompression at L4-5 intraoperative film 1. EXAM: PORTABLE SPINE - 1 VIEW COMPARISON:  Preoperative study of October 18, 2016 FINDINGS: Numbering of the vertebral levels is in accordance with the previous x-ray. The uppermost localization needle lies 1 cm posterior to the posterosuperior margin of the L3 spinous process. The lower needle lies approximately 2 cm posterior to the posterior aspect of the L3 spinous process. IMPRESSION: Metallic localization needles posterior to the L3 spinous process. Electronically Signed   By: David  Martinique M.D.   On: 10/23/2016 08:19    Cardiac Studies   None  Patient Profile     66 yo - admitted for back surgery We were consulted when she had ST depression and hypotension during after surgery  She developed atrial fib this am She has had some palpitations at home on occasion  Assessment & Plan      1.  ST depression and hypotension negative troponins. Scheduled for catheterization today. Concern with drop in hemoglobin, will check hemoccult-- otherwise no evidence of bleeding. Likely related to recent operation.    2. Atrial fib with RVR:    On dilt drip, rate controlled this AM but continues to be in afib. May have history of this in the past, unfortunately still in a fib.  Will discuss plan to proceed with Cath with Dr. Acie Fredrickson.  Signed, Delos Haring  G, PA-C  10/25/2016, 8:07 AM    Attending Note:   The patient was seen and examined.  Agree with assessment and plan as noted above.  Changes made to the above note as needed.  Patient seen and independently examined with Delos Haring, PA .   We discussed all aspects of the encounter. I agree with the assessment and plan as stated above.  1.  ECG abn:   Associated with profound hypotension  For cath today  If she has a PCI, she will likely stay at [facility]. If the cath is unremarkable, she could return to [facility].  2. Atrial fib:   Rate is well controlled. Needs to be transitioned to PO rate controlling meds Will need DOAC. Dr. Gladstone Lighter has given the OK to start anticoagulants starting today   3. Anemia:   Possibly dilutional  No blood reported in stool - ( hemacult has been ordered )     I have spent a total of 40 minutes with patient reviewing hospital  notes , telemetry, EKGs, labs and examining patient as well as establishing an assessment and plan that was discussed with the patient. > 50% of time was spent in direct patient care.    Thayer Headings, Brooke Bonito., MD, [facility] 10/25/2016, 9:45 AM [address]  [place] Pager [phone]

## 2016-10-25 NOTE — Progress Notes (Signed)
Received pt alert and oriented X4  rom [facility] via [place]. Pt.  denies any cp or discomfort at this time. Consent signed, pt on monitor and IVF infusing.  Pt to cath lab via stretcher

## 2016-10-26 ENCOUNTER — Inpatient Hospital Stay (HOSPITAL_COMMUNITY): Payer: Medicare Other

## 2016-10-26 DIAGNOSIS — I952 Hypotension due to drugs: Secondary | ICD-10-CM

## 2016-10-26 DIAGNOSIS — I481 Persistent atrial fibrillation: Secondary | ICD-10-CM

## 2016-10-26 DIAGNOSIS — I340 Nonrheumatic mitral (valve) insufficiency: Secondary | ICD-10-CM

## 2016-10-26 LAB — ECHOCARDIOGRAM COMPLETE
Height: 63 in
Weight: 4063.52 oz

## 2016-10-26 LAB — CBC
HEMATOCRIT: 29.7 % — AB (ref 36.0–46.0)
HEMOGLOBIN: 9.5 g/dL — AB (ref 12.0–15.0)
MCH: 26 pg (ref 26.0–34.0)
MCHC: 32 g/dL (ref 30.0–36.0)
MCV: 81.4 fL (ref 78.0–100.0)
Platelets: 162 10*3/uL (ref 150–400)
RBC: 3.65 MIL/uL — ABNORMAL LOW (ref 3.87–5.11)
RDW: 15.2 % (ref 11.5–15.5)
WBC: 11.5 10*3/uL — ABNORMAL HIGH (ref 4.0–10.5)

## 2016-10-26 LAB — BASIC METABOLIC PANEL
ANION GAP: 9 (ref 5–15)
BUN: 12 mg/dL (ref 6–20)
CHLORIDE: 103 mmol/L (ref 101–111)
CO2: 28 mmol/L (ref 22–32)
Calcium: 8.5 mg/dL — ABNORMAL LOW (ref 8.9–10.3)
Creatinine, Ser: 0.89 mg/dL (ref 0.44–1.00)
GFR calc Af Amer: >60 mL/min (ref >60)
GFR calc non Af Amer: >60 mL/min (ref >60)
GLUCOSE: 109 mg/dL — AB (ref 65–99)
POTASSIUM: 3.4 mmol/L — AB (ref 3.5–5.1)
Sodium: 140 mmol/L (ref 135–145)

## 2016-10-26 MED ORDER — RIVAROXABAN 20 MG PO TABS
20.0000 mg | ORAL_TABLET | Freq: Every day | ORAL | Status: DC
  Administered 2016-10-26 – 2016-10-27 (×2): 20 mg via ORAL
  Filled 2016-10-26 (×2): qty 1

## 2016-10-26 MED ORDER — METOPROLOL TARTRATE 50 MG PO TABS
50.0000 mg | ORAL_TABLET | Freq: Two times a day (BID) | ORAL | Status: DC
  Administered 2016-10-26 – 2016-10-27 (×2): 50 mg via ORAL
  Filled 2016-10-26 (×2): qty 1

## 2016-10-26 MED ORDER — DILTIAZEM HCL ER COATED BEADS 120 MG PO CP24
120.0000 mg | ORAL_CAPSULE | Freq: Every day | ORAL | Status: DC
  Administered 2016-10-26 – 2016-10-27 (×2): 120 mg via ORAL
  Filled 2016-10-26 (×2): qty 1

## 2016-10-26 MED ORDER — METOPROLOL TARTRATE 25 MG PO TABS
25.0000 mg | ORAL_TABLET | Freq: Once | ORAL | Status: AC
  Administered 2016-10-26: 25 mg via ORAL
  Filled 2016-10-26: qty 1

## 2016-10-26 NOTE — Progress Notes (Signed)
  Echocardiogram 2D Echocardiogram has been performed.  Darlina Sicilian M 10/26/2016, 10:31 AM

## 2016-10-26 NOTE — Progress Notes (Signed)
PT Cancellation Note  Patient Details Name: [name] [name] MRN: [id number] DOB: [birth year]   Cancelled Treatment:    Reason Eval/Treat Not Completed: Medical issues which prohibited therapy (noted limited OT  due to increased HR. check back another time.)   Claretha Cooper 10/26/2016, 4:28 PM Tresa Endo PT [phone]

## 2016-10-26 NOTE — Progress Notes (Signed)
Attempted to give report to [employer] on [place], RN hanging antibiotic and will call right back.

## 2016-10-26 NOTE — Progress Notes (Signed)
Occupational Therapy Treatment Patient Details Name: [name] [name] MRN: [id number] DOB: 30-Apr-1950 Today's Date: 10/26/2016    History of present illness Pt is a 66 year old female s/p L4-5 decompression and foraminotomy L4.    PMH:  MI, CVA, HTN   OT comments  Pt's HR 104-133 during sit - stand x 3 trials and unable to continue with ADL mobility activities. Pt able to verbalize back precautions and use of ADL A/E for LB  Follow Up Recommendations  Supervision/Assistance - 24 hour    Equipment Recommendations  3 in 1 bedside commode    Recommendations for Other Services      Precautions / Restrictions Precautions Precautions: Back;Fall Precaution Comments: reviewed back precautions, pt had handout in room  Restrictions Weight Bearing Restrictions: No       Mobility Bed Mobility               General bed mobility comments: pt up in recliner upon arrival  Transfers Overall transfer level: Needs assistance Equipment used: 1 person hand held assist;Rolling walker (2 wheeled) Transfers: Sit to/from Stand Sit to Stand: Min assist         General transfer comment: unable to continue with ADL mobility to bathroom due to increasing HR to 133 with 3 trials of sit - stand    Balance Overall balance assessment: Needs assistance Sitting-balance support: No upper extremity supported;Feet supported Sitting balance-Leahy Scale: Good       Standing balance-Leahy Scale: Poor                             ADL either performed or assessed with clinical judgement   ADL Overall ADL's : Needs assistance/impaired                                       General ADL Comments: pt's HR 104-133 during sit - stand and unable to continue with acitivities     Vision Patient Visual Report: No change from baseline                Cognition Arousal/Alertness: Awake/alert Behavior During Therapy: WFL for tasks assessed/performed Overall Cognitive  Status: Within Functional Limits for tasks assessed                                                      General Comments  pt pleasant and cooperative    Pertinent Vitals/ Pain       Pain Assessment: 0-10 Pain Score: 2  Pain Descriptors / Indicators: Aching Pain Intervention(s): Monitored during session;Repositioned  Home Living                                          Prior Functioning/Environment              Frequency  Min 2X/week        Progress Toward Goals  OT Goals(current goals can now be found in the care plan section)  Progress towards OT goals: Progressing toward goals  Acute Rehab OT Goals Patient Stated Goal: return to independence  Plan Discharge plan remains appropriate  AM-PAC PT "6 Clicks" Daily Activity     Outcome Measure   Help from another person eating meals?: None Help from another person taking care of personal grooming?: A Little Help from another person toileting, which includes using toliet, bedpan, or urinal?: A Lot Help from another person bathing (including washing, rinsing, drying)?: A Lot Help from another person to put on and taking off regular upper body clothing?: A Little Help from another person to put on and taking off regular lower body clothing?: A Lot 6 Click Score: 16    End of Session Equipment Utilized During Treatment: Gait belt;Rolling walker  OT Visit Diagnosis: Muscle weakness (generalized) (M62.81)   Activity Tolerance Other (comment) (HR 104-133 with min - mod exertion)   Patient Left with call bell/phone within reach;in chair   Nurse Communication          Time: 1478-2956 OT Time Calculation (min): 19 min  Charges: OT General Charges $OT Visit: 1 Procedure OT Treatments $Therapeutic Activity: 8-22 mins     Britt Bottom 10/26/2016, 1:59 PM

## 2016-10-26 NOTE — Progress Notes (Signed)
Progress Note  Patient Name: [name] [name] Date of Encounter: 10/26/2016  Primary Cardiologist: Dr. Geraldo Pitter Tia Alert), New to Dr. Acie Fredrickson at [facility].  Subjective   Ms [name] [name] is seen today  She had a lumbar laminectomy/decompression done on 6/27, she is doing well after surgery. Her pre op hemoglobin was 11.5 and this morning it has dropped to 9.0. She denies any s/sx of bleeding. Denies having bleeding hx. Has had anemia in the past.  Still in rate controlled atrial fibrillation Cath yesterday shows no CAD. Has an elevated LV EDP   Inpatient Medications    Scheduled Meds: . metoprolol tartrate  25 mg Oral BID  . mometasone-formoterol  2 puff Inhalation BID  . sodium chloride flush  3 mL Intravenous Q12H  . venlafaxine XR  75 mg Oral Q breakfast   Continuous Infusions: . sodium chloride Stopped (10/26/16 0600)  . diltiazem (CARDIZEM) infusion 5 mg/hr (10/26/16 0823)  . heparin 1,000 Units/hr (10/26/16 0600)  . methocarbamol (ROBAXIN)  IV Stopped (10/23/16 1430)   PRN Meds: sodium chloride, acetaminophen **OR** acetaminophen, acetaminophen, albuterol, bisacodyl, HYDROcodone-acetaminophen, HYDROmorphone (DILAUDID) injection, menthol-cetylpyridinium **OR** phenol, methocarbamol **OR** methocarbamol (ROBAXIN)  IV, ondansetron **OR** ondansetron (ZOFRAN) IV, oxyCODONE-acetaminophen, polyethylene glycol, sodium chloride flush, sodium phosphate   Vital Signs    Vitals:   10/26/16 0400 10/26/16 0500 10/26/16 0600 10/26/16 0800  BP: 139/81 (!) 147/91 (!) 148/97 (!) 165/90  Pulse: (!) 106 95 (!) 117 97  Resp: 20 (!) 23 (!) 21 20  Temp:    98.2 F (36.8 C)  TempSrc:    Oral  SpO2: 94% 93% 93% 95%  Weight:      Height:        Intake/Output Summary (Last 24 hours) at 10/26/16 0850 Last data filed at 10/26/16 0600  Gross per 24 hour  Intake           905.62 ml  Output              600 ml  Net           305.62 ml   Filed Weights   10/23/16 0534 10/23/16 1230  Weight: 254  lb (115.2 kg) 253 lb 15.5 oz (115.2 kg)    Telemetry    Rate controlled atrial fibrillation - Personally Reviewed   Physical Exam   GEN: Well nourished, well developed HEENT: normal  Neck: no JVD, carotid bruits, or masses Cardiac: irreg irreg. no murmurs, rubs, or gallops,no edema. Intact distal pulses bilaterally.  Respiratory: clear to auscultation bilaterally, normal work of breathing GI: soft, nontender, nondistended, + BS MS: no deformity or atrophy  Skin: warm and dry, no rash Neuro: Alert and Oriented x 3, Strength and sensation are intact Psych:   Full affect  Labs    Chemistry  Recent Labs Lab 10/23/16 1002 10/25/16 0514 10/26/16 0340  NA 136 142 140  K 3.3* 3.7 3.4*  CL  --  104 103  CO2  --  30 28  GLUCOSE  --  126* 109*  BUN  --  17 12  CREATININE  --  1.01* 0.89  CALCIUM  --  8.7* 8.5*  GFRNONAA  --  57* >60  GFRAA  --  >60 >60  ANIONGAP  --  8 9     Hematology  Recent Labs Lab 10/23/16 1002 10/25/16 0514 10/26/16 0340  WBC  --  11.0* 11.5*  RBC  --  3.49* 3.65*  HGB 13.3 9.0* 9.5*  HCT 39.0 28.2* 29.7*  MCV  --  80.8 81.4  MCH  --  25.8* 26.0  MCHC  --  31.9 32.0  RDW  --  14.9 15.2  PLT  --  142* 162    Cardiac Enzymes  Recent Labs Lab 10/23/16 1242 10/23/16 1752 10/23/16 2311  TROPONINI 0.03* 0.04* 0.03*   No results for input(s): TROPIPOC in the last 168 hours.   BNPNo results for input(s): BNP, PROBNP in the last 168 hours.   DDimer No results for input(s): DDIMER in the last 168 hours.   Radiology    No results found.  Cardiac Studies   None  Patient Profile     66 yo - admitted for back surgery We were consulted when she had ST depression and hypotension during after surgery  She developed atrial fib this am She has had some palpitations at home on occasion  Assessment & Plan    1.  ST depression and hypotension negative troponins. Cath shows no CAD .  2. Atrial fib with RVR:    On dilt drip, rate  controlled this AM but continues to be in afib.  will start Xarelto 20 mg a day Increase metoprolol to 50 BID for rate control Add Cardizem 120 mg CD a day  Will try to wean her off the Dilt drip today Will get an echo  Potentially can be discharged home tomorrow

## 2016-10-27 LAB — OCCULT BLOOD X 1 CARD TO LAB, STOOL: Fecal Occult Bld: NEGATIVE

## 2016-10-27 MED ORDER — CEPHALEXIN 500 MG PO CAPS
500.0000 mg | ORAL_CAPSULE | Freq: Four times a day (QID) | ORAL | 0 refills | Status: AC
Start: 2016-10-27 — End: 2016-11-06

## 2016-10-27 MED ORDER — HYDROCORTISONE 1 % EX CREA
1.0000 "application " | TOPICAL_CREAM | Freq: Three times a day (TID) | CUTANEOUS | Status: DC | PRN
  Filled 2016-10-27: qty 28

## 2016-10-27 NOTE — Progress Notes (Signed)
    Subjective: 2 Days Post-Op Procedure(s) (LRB): Left Heart Cath and Coronary Angiography (N/A) Ultrasound Guidance For Vascular Access Patient reports pain as 4 on 0-10 scale.   Denies CP or SOB.  Voiding without difficulty. Positive flatus.  Erythema noted around the wound and on the pts back.  The pt says she has been allergic to some of the tape used while she has been inpatient and she has been "scratching." Objective: Vital signs in last 24 hours: Temp:  [98.2 F (36.8 C)-98.8 F (37.1 C)] 98.8 F (37.1 C) (07/01 0421) Pulse Rate:  [61-76] 66 (07/01 0421) Resp:  [20-24] 20 (07/01 0421) BP: (115-127)/(58-69) 127/69 (07/01 0421) SpO2:  [94 %-99 %] 98 % (07/01 0849) Weight:  [125.6 kg (276 lb 14.4 oz)] 125.6 kg (276 lb 14.4 oz) (06/30 2214)  Intake/Output from previous day: 06/30 0701 - 07/01 0700 In: 14900.8 [P.O.:120; I.V.:14630.8; IV Piggyback:150] Out: 400 [Urine:400] Intake/Output this shift: Total I/O In: 100 [P.O.:100] Out: -   Labs:  Recent Labs  10/25/16 0514 10/26/16 0340  HGB 9.0* 9.5*    Recent Labs  10/25/16 0514 10/26/16 0340  WBC 11.0* 11.5*  RBC 3.49* 3.65*  HCT 28.2* 29.7*  PLT 142* 162    Recent Labs  10/25/16 0514 10/26/16 0340  NA 142 140  K 3.7 3.4*  CL 104 103  CO2 30 28  BUN 17 12  CREATININE 1.01* 0.89  GLUCOSE 126* 109*  CALCIUM 8.7* 8.5*   No results for input(s): LABPT, INR in the last 72 hours.  Physical Exam: Neurologically intact ABD soft Sensation intact distally Dorsiflexion/Plantar flexion intact Incision: no drainage and staples in place Compartment soft  Erythema noted around incision, no drainage  Assessment/Plan: 2 Days Post-Op Procedure(s) (LRB): Left Heart Cath and Coronary Angiography (N/A) Ultrasound Guidance For Vascular Access Cariology has signed off Anx will be provided for pt Pt will f/u in clinic this week with Dr. Cay Schillings to continue to monitor wound Pt will DC today after cleared by  PT  [place], Darla Lesches for Dr. Melina Schools [facility] [phone] 10/27/2016, 1:09 PM    Patient ID: [name] [name], female   DOB: [birth year], 66 y.o.   MRN: [id number]

## 2016-10-27 NOTE — Progress Notes (Addendum)
Progress Note  Patient Name: [name] [name] Date of Encounter: 10/27/2016  Primary Cardiologist: Dr. Geraldo Pitter Tia Alert), New to Dr. Acie Fredrickson at [facility]  Subjective   Ms [name] [name] is seen today  She had a lumbar laminectomy/decompression done on 6/27, she is doing well after surgery. Her pre op hemoglobin was 11.5 and this morning it has dropped to 9.0. She denies any s/sx of bleeding. Denies having bleeding hx. Has had anemia in the past.    Cath Friday shows no CAD. Has an elevated LV EDP  Has converted to NSR    Inpatient Medications    Scheduled Meds: . diltiazem  120 mg Oral Daily  . metoprolol tartrate  50 mg Oral BID  . mometasone-formoterol  2 puff Inhalation BID  . rivaroxaban  20 mg Oral Daily  . sodium chloride flush  3 mL Intravenous Q12H  . venlafaxine XR  75 mg Oral Q breakfast   Continuous Infusions: . sodium chloride Stopped (10/26/16 0600)  . methocarbamol (ROBAXIN)  IV Stopped (10/23/16 1430)   PRN Meds: sodium chloride, acetaminophen **OR** acetaminophen, acetaminophen, albuterol, bisacodyl, HYDROcodone-acetaminophen, hydrocortisone cream, HYDROmorphone (DILAUDID) injection, menthol-cetylpyridinium **OR** phenol, methocarbamol **OR** methocarbamol (ROBAXIN)  IV, ondansetron **OR** ondansetron (ZOFRAN) IV, oxyCODONE-acetaminophen, polyethylene glycol, sodium chloride flush, sodium phosphate   Vital Signs    Vitals:   10/26/16 2002 10/26/16 2214 10/27/16 0421 10/27/16 0849  BP:  (!) 115/58 127/69   Pulse:  76 66   Resp:  20 20   Temp: 98.3 F (36.8 C) 98.2 F (36.8 C) 98.8 F (37.1 C)   TempSrc: Oral Oral Oral   SpO2:  99% 94% 98%  Weight:  276 lb 14.4 oz (125.6 kg)    Height:  5\' 3"  (1.6 m)      Intake/Output Summary (Last 24 hours) at 10/27/16 0935 Last data filed at 10/27/16 0836  Gross per 24 hour  Intake         15000.77 ml  Output              400 ml  Net         14600.77 ml   Filed Weights   10/23/16 0534 10/23/16 1230 10/26/16 2214    Weight: 254 lb (115.2 kg) 253 lb 15.5 oz (115.2 kg) 276 lb 14.4 oz (125.6 kg)    Telemetry    Rate controlled atrial fibrillation - Personally Reviewed   Physical Exam   GEN: Well nourished, well developed HEENT: normal  Neck: no JVD, carotid bruits, or masses Cardiac: RR  no murmurs, rubs, or gallops,no edema. Intact distal pulses bilaterally.  Respiratory: clear to auscultation bilaterally, normal work of breathing GI: soft, nontender, nondistended, + BS MS: no deformity or atrophy  Skin: warm and dry, no rash Neuro: Alert and Oriented x 3, Strength and sensation are intact Psych:   Full affect  Labs    Chemistry  Recent Labs Lab 10/23/16 1002 10/25/16 0514 10/26/16 0340  NA 136 142 140  K 3.3* 3.7 3.4*  CL  --  104 103  CO2  --  30 28  GLUCOSE  --  126* 109*  BUN  --  17 12  CREATININE  --  1.01* 0.89  CALCIUM  --  8.7* 8.5*  GFRNONAA  --  57* >60  GFRAA  --  >60 >60  ANIONGAP  --  8 9     Hematology  Recent Labs Lab 10/23/16 1002 10/25/16 0514 10/26/16 0340  WBC  --  11.0* 11.5*  RBC  --  3.49* 3.65*  HGB 13.3 9.0* 9.5*  HCT 39.0 28.2* 29.7*  MCV  --  80.8 81.4  MCH  --  25.8* 26.0  MCHC  --  31.9 32.0  RDW  --  14.9 15.2  PLT  --  142* 162    Cardiac Enzymes  Recent Labs Lab 10/23/16 1242 10/23/16 1752 10/23/16 2311  TROPONINI 0.03* 0.04* 0.03*   No results for input(s): TROPIPOC in the last 168 hours.   BNPNo results for input(s): BNP, PROBNP in the last 168 hours.   DDimer No results for input(s): DDIMER in the last 168 hours.   Radiology    No results found.  Cardiac Studies   None  Patient Profile     66 yo - admitted for back surgery We were consulted when she had ST depression and hypotension during after surgery  She developed atrial fib this am She has had some palpitations at home on occasion  Assessment & Plan    1.  ST depression and hypotension negative troponins. Cath shows no CAD .  2. Atrial fib  with RVR:      We converted her to PO meds Has converted overnight to NSR  Echo shows normal LV systolic function EF 74-08% On Xarelto 20 mg a day    she will follow up with Dr. Geraldo Pitter in [place] . She has my card and can follow up with me if needed.  3.  Recent back surgery : She is ready to go from our standpoint.  Will check with Dr. Gladstone Lighter / ortho team    Mertie Moores, MD  10/27/2016 9:44 AM    [facility] [place],  [place], [place]  [address] Pager [phone] Phone: [phone]; Fax: [phone]

## 2016-10-27 NOTE — Care Management Note (Addendum)
Case Management Note  Patient Details  Name: SHEKINA CORDELL MRN: [id number] Date of Birth: 02-09-1951  Subjective/Objective:   S/p lumbar laminectomy/decompression on 10/23/2016, afib with RVR                 Action/Plan: Discharge Planning: NCM spoke to pt and offered choice for HH/list provided. Pt agreeable to [facility] for HHPT/aide. Will need HHPT orders with F2F and aide. Left message for attending. Pt states she has Rollator and wheelchair at home. She is requesting 3n1 bedside commode. Contacted [facility] rep for equipment and HH.   Received call from [place] and they cannot accept referral due to out of service area.   Per PT recommendations, no HHPT/OT needed.   PCP Nicholos Johns MD  Expected Discharge Date:                 Expected Discharge Plan:  [place]  In-House Referral:  NA  Discharge planning Services  CM Consult  Post Acute Care Choice:  Home Health Choice offered to:  Patient  DME Arranged:  3-N-1 DME Agency:  [place]:  NA [place] Agency: NA  Status of Service:  Completed, signed off  If discussed at [place] of Stay Meetings, dates discussed:    Additional Comments:  Erenest Rasher, RN 10/27/2016, 11:24 AM

## 2016-10-27 NOTE — Care Management Important Message (Signed)
Important Message  Patient Details  Name: [name] [name] MRN: [id number] Date of Birth: 1950-05-25   Medicare Important Message Given:  Yes    Erenest Rasher, RN 10/27/2016, 10:56 AM

## 2016-10-27 NOTE — Progress Notes (Signed)
After discussion with Dr. Rolena Infante he would like the pt seen in clinic by Dr. Cay Schillings on Tuesday for wound check.   Pt has not had fevers, chills, night sweats.  Erythema noted but no discharge.  Pt reports a tape allergy.  Advised use of paper tape.   Ronette Deter, [facility]

## 2016-10-28 ENCOUNTER — Encounter (HOSPITAL_COMMUNITY): Payer: Self-pay | Admitting: Interventional Cardiology

## 2016-10-28 DIAGNOSIS — R269 Unspecified abnormalities of gait and mobility: Secondary | ICD-10-CM | POA: Diagnosis not present

## 2016-11-05 NOTE — Discharge Summary (Signed)
Physician Discharge Summary  Patient ID: [name] [name] MRN: [id number] DOB/AGE: 1951/03/05 66 y.o.  Admit date: 10/23/2016 Discharge date: 10/27/16  Admission Diagnoses:  ST segment depression  Discharge Diagnoses:  Principal Problem:   ST segment depression Active Problems:   Status post lumbar spine operative procedure for decompression of spinal cord   Hypotension   Past Medical History:  Diagnosis Date  . Arthritis   . Hypertension   . MI (myocardial infarction) ([place])   . Pre-diabetes   . Stroke ([place])   . Thyroid disease     Surgeries: Procedure(s): Left Heart Cath and Coronary Angiography Ultrasound Guidance For Vascular Access on 10/23/2016 - 10/25/2016   Consultants (if any): Treatment Team:  Lbcardiology, [place], MD  Discharged Condition: Improved  Hospital Course: [name] [name] is an 66 y.o. female who was admitted 10/23/2016 with a diagnosis of ST segment depression and went to the operating room on 10/23/2016 - 10/25/2016 and underwent the above named procedures.  Post op day 2 pt reports moderate pain controlled by medication.  Cardiology was monitoring pt and post op appt was set up.  Pt ambulating w/o difficulty. Pt had allergic reaction to tape used around wound.  Anx started before DC.   She was given perioperative antibiotics:  Anti-infectives    Start     Dose/Rate Route Frequency Ordered Stop   10/27/16 0000  cephALEXin (KEFLEX) 500 MG capsule     500 mg Oral 4 times daily 10/27/16 1329 11/06/16 2359   10/23/16 1600  ceFAZolin (ANCEF) IVPB 1 g/50 mL premix     1 g 100 mL/hr over 30 Minutes Intravenous Every 8 hours 10/23/16 1205 10/24/16 0927   10/23/16 0654  ceFAZolin (ANCEF) 2-4 GM/100ML-% IVPB    Comments:  [place], Bella Kennedy   : cabinet override      10/23/16 0654 10/23/16 0736   10/23/16 0515  ceFAZolin (ANCEF) IVPB 2g/100 mL premix     2 g 200 mL/hr over 30 Minutes Intravenous On call to O.R. 10/23/16 0515 10/23/16 0756   10/23/16 0000   polymyxin B 500,000 Units, bacitracin 50,000 Units in sodium chloride 0.9 % 500 mL irrigation  Status:  Discontinued       As needed 10/23/16 0841 10/23/16 1022    .  She was given sequential compression devices, early ambulation, and TED for DVT prophylaxis.  She benefited maximally from the hospital stay and there were no complications.    Recent vital signs:  Vitals:   10/27/16 0421 10/27/16 1319  BP: 127/69 117/60  Pulse: 66 (!) 58  Resp: 20 18  Temp: 98.8 F (37.1 C) 98.2 F (36.8 C)    Recent laboratory studies:  Lab Results  Component Value Date   HGB 9.5 (L) 10/26/2016   HGB 9.0 (L) 10/25/2016   HGB 13.3 10/23/2016   Lab Results  Component Value Date   WBC 11.5 (H) 10/26/2016   PLT 162 10/26/2016   Lab Results  Component Value Date   INR 1.90 10/18/2016   Lab Results  Component Value Date   NA 140 10/26/2016   K 3.4 (L) 10/26/2016   CL 103 10/26/2016   CO2 28 10/26/2016   BUN 12 10/26/2016   CREATININE 0.89 10/26/2016   GLUCOSE 109 (H) 10/26/2016    Discharge Medications:   Allergies as of 10/27/2016      Reactions   Sulfonamide Derivatives Itching      Medication List    STOP taking these medications  oxyCODONE-acetaminophen 5-325 MG tablet Commonly known as:  PERCOCET/ROXICET     TAKE these medications   albuterol 108 (90 Base) MCG/ACT inhaler Commonly known as:  PROVENTIL HFA;VENTOLIN HFA Inhale 2 puffs into the lungs every 6 (six) hours as needed for wheezing or shortness of breath.   budesonide-formoterol 160-4.5 MCG/ACT inhaler Commonly known as:  SYMBICORT Inhale 2 puffs into the lungs 2 (two) times daily.   CALCIUM PO Take 1 tablet by mouth daily.   cephALEXin 500 MG capsule Commonly known as:  KEFLEX Take 1 capsule (500 mg total) by mouth 4 (four) times daily.   cholecalciferol 1000 units tablet Commonly known as:  VITAMIN D Take 2,000 Units by mouth daily.   diphenhydramine-acetaminophen 25-500 MG Tabs tablet Commonly  known as:  TYLENOL PM Take 2 tablets by mouth at bedtime as needed (pain/sleep).   docusate sodium 100 MG capsule Commonly known as:  COLACE Take 200 mg by mouth 2 (two) times daily as needed for mild constipation.   fluticasone 50 MCG/ACT nasal spray Commonly known as:  FLONASE Place 2 sprays into both nostrils daily as needed for allergies or rhinitis.   furosemide 40 MG tablet Commonly known as:  LASIX Take 40 mg by mouth 2 (two) times daily.   GLUCOSAMINE CHONDROITIN JOINT PO Take 1 tablet by mouth 2 (two) times daily.   HYDROcodone-acetaminophen 10-325 MG tablet Commonly known as:  NORCO Take 1 tablet by mouth every 4 (four) hours as needed (breakthrough pain).   levothyroxine 112 MCG tablet Commonly known as:  SYNTHROID, LEVOTHROID Take 112 mcg by mouth daily before breakfast.   losartan-hydrochlorothiazide 100-25 MG tablet Commonly known as:  HYZAAR Take 1 tablet by mouth daily.   methocarbamol 500 MG tablet Commonly known as:  ROBAXIN Take 1 tablet (500 mg total) by mouth every 6 (six) hours as needed for muscle spasms.   omega-3 acid ethyl esters 1 g capsule Commonly known as:  LOVAZA Take 4 g by mouth daily.   omeprazole 20 MG capsule Commonly known as:  PRILOSEC Take 20 mg by mouth daily.   potassium chloride SA 20 MEQ tablet Commonly known as:  K-DUR,KLOR-CON Take 100-120 mEq by mouth See admin instructions. Pt to take 6 tablets in the morning, and 5 tablets in the evening   rivaroxaban 20 MG Tabs tablet Commonly known as:  XARELTO Take 20 mg by mouth every morning.   venlafaxine XR 75 MG 24 hr capsule Commonly known as:  EFFEXOR-XR Take 75 mg by mouth daily with breakfast.   VITAMIN B-12 PO Take 2,000 mg by mouth daily.   Vitamin D (Ergocalciferol) 50000 units Caps capsule Commonly known as:  DRISDOL Take 50,000 Units by mouth every Sunday.       Diagnostic Studies: Dg Chest 2 View  Result Date: 10/18/2016 CLINICAL DATA:  Preoperative  lumbar laminectomy EXAM: CHEST  2 VIEW COMPARISON:  June 18, 2016 FINDINGS: There is stable elevation of the right hemidiaphragm. There is no edema or consolidation. The heart size and pulmonary vascularity are normal. No adenopathy. There is aortic atherosclerosis. No bone lesions. IMPRESSION: Stable elevation of right hemidiaphragm. No edema or consolidation. Stable cardiac silhouette. Electronically Signed   By: Lowella Grip III M.D.   On: 10/18/2016 11:11   Dg Lumbar Spine 2-3 Views  Result Date: 10/18/2016 CLINICAL DATA:  Lumbago. Preoperative laminectomy with decompression EXAM: LUMBAR SPINE - 2-3 VIEW COMPARISON:  None. FINDINGS: Frontal and lateral views were obtained. There are 5 non-rib-bearing lumbar type vertebral  bodies. There is dextroscoliosis with rotatory component. There is no fracture. There is 10 mm of anterolisthesis of L4 on L5. No other spondylolisthesis evident. There is moderate disc space narrowing at L2-3, L3-4, and L4-5 with slightly less narrowing at L5-S1. There are anterior osteophytes at L2, L3, and L4. No erosive change. IMPRESSION: Scoliosis. Multilevel osteoarthritic change. 10 mm of anterolisthesis of L4 on L5. No other spondylolisthesis. No fracture. Electronically Signed   By: Lowella Grip III M.D.   On: 10/18/2016 11:13   Dg Spine Portable 1 View  Result Date: 10/23/2016 CLINICAL DATA:  L4-5 decompression EXAM: PORTABLE SPINE - 1 VIEW COMPARISON:  None. FINDINGS: Single cross-table lateral x-ray of the lumbar spine. Posterior tissue retractors. Two metallic probes. Superior metallic probe projects along the L4-5 facet joint. More inferior metallic probe tip projects over the L5 pedicle. IMPRESSION: Intraoperative localization as described above. Electronically Signed   By: Kathreen Devoid   On: 10/23/2016 09:44   Dg Spine Portable 1 View  Result Date: 10/23/2016 CLINICAL DATA:  Intraoperative x-ray. EXAM: PORTABLE SPINE - 1 VIEW COMPARISON:  None.  FINDINGS: Single cross-table lateral portable x-ray of the lumbar spine. Posterior tissue retractors. Curved tip metallic probe with the tip projecting over the L4 posterior element at the level of the transverse process. Grade 1 anterolisthesis of L4 on L5. IMPRESSION: Intraoperative localization as described above. Electronically Signed   By: Kathreen Devoid   On: 10/23/2016 09:10   Dg Spine Portable 1 View  Result Date: 10/23/2016 CLINICAL DATA:  Intraoperative localization EXAM: PORTABLE SPINE - 1 VIEW COMPARISON:  10/23/2016, 10/18/2016 FINDINGS: posterior surgical instrument overlies the L4 spinous process directed at the L4-5 disc space. IMPRESSION: Intraoperative localization as above. Electronically Signed   By: Rolm Baptise M.D.   On: 10/23/2016 08:38   Dg Spine Portable 1 View  Result Date: 10/23/2016 CLINICAL DATA:  Lumbar decompression at L4-5 intraoperative film 1. EXAM: PORTABLE SPINE - 1 VIEW COMPARISON:  Preoperative study of October 18, 2016 FINDINGS: Numbering of the vertebral levels is in accordance with the previous x-ray. The uppermost localization needle lies 1 cm posterior to the posterosuperior margin of the L3 spinous process. The lower needle lies approximately 2 cm posterior to the posterior aspect of the L3 spinous process. IMPRESSION: Metallic localization needles posterior to the L3 spinous process. Electronically Signed   By: David  Martinique M.D.   On: 10/23/2016 08:19    Disposition: 01-Home or Self Care Pt will f/u with her surgeon on Tuesday for wound check 7/3 Post op medications provided   Follow-up Information    Latanya Maudlin, MD. Schedule an appointment as soon as possible for a visit in 2 week(s).   Specialty:  Orthopedic Surgery Contact information: [address] [place] [address] [phone]            Signed: Valinda Hoar 11/05/2016, 12:35 PM

## 2016-11-11 DIAGNOSIS — M6281 Muscle weakness (generalized): Secondary | ICD-10-CM | POA: Diagnosis not present

## 2016-11-11 DIAGNOSIS — R262 Difficulty in walking, not elsewhere classified: Secondary | ICD-10-CM | POA: Diagnosis not present

## 2016-11-11 DIAGNOSIS — M25659 Stiffness of unspecified hip, not elsewhere classified: Secondary | ICD-10-CM | POA: Diagnosis not present

## 2016-11-11 DIAGNOSIS — M545 Low back pain: Secondary | ICD-10-CM | POA: Diagnosis not present

## 2016-11-14 ENCOUNTER — Ambulatory Visit: Payer: Self-pay | Admitting: Cardiology

## 2016-11-14 DIAGNOSIS — M6281 Muscle weakness (generalized): Secondary | ICD-10-CM | POA: Diagnosis not present

## 2016-11-14 DIAGNOSIS — M545 Low back pain: Secondary | ICD-10-CM | POA: Diagnosis not present

## 2016-11-14 DIAGNOSIS — R262 Difficulty in walking, not elsewhere classified: Secondary | ICD-10-CM | POA: Diagnosis not present

## 2016-11-14 DIAGNOSIS — M25659 Stiffness of unspecified hip, not elsewhere classified: Secondary | ICD-10-CM | POA: Diagnosis not present

## 2016-11-19 DIAGNOSIS — M545 Low back pain: Secondary | ICD-10-CM | POA: Diagnosis not present

## 2016-11-19 DIAGNOSIS — M6281 Muscle weakness (generalized): Secondary | ICD-10-CM | POA: Diagnosis not present

## 2016-11-19 DIAGNOSIS — M25659 Stiffness of unspecified hip, not elsewhere classified: Secondary | ICD-10-CM | POA: Diagnosis not present

## 2016-11-19 DIAGNOSIS — R262 Difficulty in walking, not elsewhere classified: Secondary | ICD-10-CM | POA: Diagnosis not present

## 2016-11-20 DIAGNOSIS — M545 Low back pain: Secondary | ICD-10-CM | POA: Diagnosis not present

## 2016-11-20 DIAGNOSIS — R262 Difficulty in walking, not elsewhere classified: Secondary | ICD-10-CM | POA: Diagnosis not present

## 2016-11-20 DIAGNOSIS — M25659 Stiffness of unspecified hip, not elsewhere classified: Secondary | ICD-10-CM | POA: Diagnosis not present

## 2016-11-20 DIAGNOSIS — M6281 Muscle weakness (generalized): Secondary | ICD-10-CM | POA: Diagnosis not present

## 2016-11-25 DIAGNOSIS — I10 Essential (primary) hypertension: Secondary | ICD-10-CM | POA: Diagnosis not present

## 2016-11-25 DIAGNOSIS — R5383 Other fatigue: Secondary | ICD-10-CM | POA: Diagnosis not present

## 2016-11-25 DIAGNOSIS — Z79899 Other long term (current) drug therapy: Secondary | ICD-10-CM | POA: Diagnosis not present

## 2016-11-25 DIAGNOSIS — E538 Deficiency of other specified B group vitamins: Secondary | ICD-10-CM | POA: Diagnosis not present

## 2016-11-25 DIAGNOSIS — E119 Type 2 diabetes mellitus without complications: Secondary | ICD-10-CM | POA: Diagnosis not present

## 2016-11-25 DIAGNOSIS — I480 Paroxysmal atrial fibrillation: Secondary | ICD-10-CM | POA: Diagnosis not present

## 2016-11-25 DIAGNOSIS — E559 Vitamin D deficiency, unspecified: Secondary | ICD-10-CM | POA: Diagnosis not present

## 2016-11-25 DIAGNOSIS — D649 Anemia, unspecified: Secondary | ICD-10-CM | POA: Diagnosis not present

## 2016-11-25 DIAGNOSIS — E039 Hypothyroidism, unspecified: Secondary | ICD-10-CM | POA: Diagnosis not present

## 2016-11-25 DIAGNOSIS — E785 Hyperlipidemia, unspecified: Secondary | ICD-10-CM | POA: Diagnosis not present

## 2016-11-27 ENCOUNTER — Encounter: Payer: Self-pay | Admitting: Physician Assistant

## 2016-11-27 ENCOUNTER — Ambulatory Visit (INDEPENDENT_AMBULATORY_CARE_PROVIDER_SITE_OTHER): Payer: Medicare Other | Admitting: Physician Assistant

## 2016-11-27 VITALS — BP 125/74 | HR 74 | Ht 63.0 in | Wt 253.0 lb

## 2016-11-27 DIAGNOSIS — I480 Paroxysmal atrial fibrillation: Secondary | ICD-10-CM | POA: Diagnosis not present

## 2016-11-27 DIAGNOSIS — I10 Essential (primary) hypertension: Secondary | ICD-10-CM | POA: Diagnosis not present

## 2016-11-27 MED ORDER — DILTIAZEM HCL ER COATED BEADS 120 MG PO CP24: 120.0000 mg | ORAL_CAPSULE | Freq: Every day | ORAL | 6 refills | Status: DC

## 2016-11-27 NOTE — Progress Notes (Signed)
Cardiology Office Note    Date:  11/27/2016   ID:  [name] [name], DOB 1951-02-05, MRN [id number]  PCP:  Nicholos Johns, MD  Cardiologist: Jyl Heinz, MD ( [place])   Chief Complaint: Hospital follow up s/p  Cath   History of Present Illness:   [name] [name] is a 66 y.o. female with a hx of HTN, PAF,  RBBB, CVA presents for hospital follow up.    She had a lumbar laminectomy/decompression done on 10/23/16. During surgery noted ST depression and hypotension. Cardiology is asked for further management. Hospital course is complicated by rate control atrial fibrillation. Cardiac catheterization showed no CAD. Elevated LVEDP. Started on [place]. Converted to sinus rhythm. Echo showed normal LV function.   Here today for follow-up. For some reason patient was not discharged on Cardizem CD 120 mg which she was taking during admission. She continues to have intermittent palpitation or shortness of breath lasting for a few minutes and self resolution. No dizziness, chest pain, orthopnea, PND, syncope, lower extremity edema, melena or blood in her stool or urine.  Past Medical History:  Diagnosis Date  . Arthritis   . Hypertension   . MI (myocardial infarction) ([place])   . Pre-diabetes   . Stroke ([place])   . Thyroid disease     Past Surgical History:  Procedure Laterality Date  . LEFT HEART CATH AND CORONARY ANGIOGRAPHY N/A 10/25/2016   Procedure: Left Heart Cath and Coronary Angiography;  Surgeon: Belva Crome, MD;  Location: [facility];  Service: Cardiovascular;  Laterality: N/A;  . LUMBAR LAMINECTOMY/DECOMPRESSION MICRODISCECTOMY N/A 10/23/2016   Procedure: LUMBAR LAMINECTOMY/DECOMPRESSION MICRODISCECTOMY;  Surgeon: Latanya Maudlin, MD;  Location: [facility];  Service: Orthopedics;  Laterality: N/A;  . ULTRASOUND GUIDANCE FOR VASCULAR ACCESS  10/25/2016   Procedure: Ultrasound Guidance For Vascular Access;  Surgeon: Belva Crome, MD;  Location: [facility];   Service: Cardiovascular;;    Current Medications: Prior to Admission medications   Medication Sig Start Date End Date Taking? Authorizing Provider  albuterol (PROVENTIL HFA;VENTOLIN HFA) 108 (90 BASE) MCG/ACT inhaler Inhale 2 puffs into the lungs every 6 (six) hours as needed for wheezing or shortness of breath. 06/21/14   Reyne Dumas, MD  budesonide-formoterol (SYMBICORT) 160-4.5 MCG/ACT inhaler Inhale 2 puffs into the lungs 2 (two) times daily.    [provider]  CALCIUM PO Take 1 tablet by mouth daily.    [provider]  cholecalciferol (VITAMIN D) 1000 UNITS tablet Take 2,000 Units by mouth daily.    [provider]  Cyanocobalamin (VITAMIN B-12 PO) Take 2,000 mg by mouth daily.    [provider]  diphenhydramine-acetaminophen (TYLENOL PM) 25-500 MG TABS Take 2 tablets by mouth at bedtime as needed (pain/sleep).    [provider]  docusate sodium (COLACE) 100 MG capsule Take 200 mg by mouth 2 (two) times daily as needed for mild constipation.    [provider]  fluticasone (FLONASE) 50 MCG/ACT nasal spray Place 2 sprays into both nostrils daily as needed for allergies or rhinitis.    [provider]  furosemide (LASIX) 40 MG tablet Take 40 mg by mouth 2 (two) times daily.     [provider]  Glucos-Chondroit-Hyaluron-MSM (GLUCOSAMINE CHONDROITIN JOINT PO) Take 1 tablet by mouth 2 (two) times daily.    [provider]  HYDROcodone-acetaminophen (NORCO) 10-325 MG tablet Take 1 tablet by mouth every 4 (four) hours as needed (breakthrough pain). 10/25/16   Cecilio Asper,  Amber, PA-C  levothyroxine (SYNTHROID, LEVOTHROID) 112 MCG tablet Take 112 mcg by mouth daily before breakfast.    [provider]  losartan-hydrochlorothiazide (HYZAAR) 100-25 MG per tablet Take 1 tablet by mouth daily. 06/27/14   Reyne Dumas, MD  methocarbamol (ROBAXIN) 500 MG tablet Take 1 tablet (500 mg total) by mouth every 6 (six)  hours as needed for muscle spasms. 10/25/16   Constable, Amber, PA-C  omega-3 acid ethyl esters (LOVAZA) 1 G capsule Take 4 g by mouth daily.    [provider]  omeprazole (PRILOSEC) 20 MG capsule Take 20 mg by mouth daily.    [provider]  potassium chloride SA (K-DUR,KLOR-CON) 20 MEQ tablet Take 100-120 mEq by mouth See admin instructions. Pt to take 6 tablets in the morning, and 5 tablets in the evening    [provider]  rivaroxaban (XARELTO) 20 MG TABS tablet Take 20 mg by mouth every morning.    [provider]  venlafaxine XR (EFFEXOR-XR) 75 MG 24 hr capsule Take 75 mg by mouth daily with breakfast.    [provider]  Vitamin D, Ergocalciferol, (DRISDOL) 50000 units CAPS capsule Take 50,000 Units by mouth every Sunday.    [provider]    Allergies:   Sulfonamide derivatives   Social History   Social History  . Marital status: Widowed    Spouse name: N/A  . Number of children: N/A  . Years of education: N/A   Social History Main Topics  . Smoking status: Never Smoker  . Smokeless tobacco: Never Used  . Alcohol use No  . Drug use: No  . Sexual activity: Not Asked   Other Topics Concern  . None   Social History Narrative  . None     Family History:  The patient's family history includes Hypertension in her father and mother.   ROS:   Please see the history of present illness.    ROS All other systems reviewed and are negative.   PHYSICAL EXAM:   VS:  BP 125/74   Pulse 74   Ht 5\' 3"  (1.6 m)   Wt 253 lb (114.8 kg)   BMI 44.82 kg/m    GEN: Well nourished, well developed, in no acute distress  HEENT: normal  Neck: no JVD, carotid bruits, or masses Cardiac: RRR; no murmurs, rubs, or gallops,no edema  Respiratory:  clear to auscultation bilaterally, normal work of breathing GI: soft, nontender, nondistended, + BS MS: no deformity or atrophy  Skin: warm and dry, no rash Neuro:  Alert and Oriented x 3,  Strength and sensation are intact Psych: euthymic mood, full affect  Wt Readings from Last 3 Encounters:  11/27/16 253 lb (114.8 kg)  10/26/16 276 lb 14.4 oz (125.6 kg)  10/18/16 254 lb 9.6 oz (115.5 kg)      Studies/Labs Reviewed:   EKG:  EKG is ordered today.  The ekg ordered today demonstrates Sinus rhythm at rate of 77 bpm, PVC, right bundle branch block and T-wave inversion in anterior leads. No acute changes.  Recent Labs: 10/18/2016: ALT 22 10/26/2016: BUN 12; Creatinine, Ser 0.89; Hemoglobin 9.5; Platelets 162; Potassium 3.4; Sodium 140   Lipid Panel No results found for: CHOL, TRIG, HDL, CHOLHDL, VLDL, LDLCALC, LDLDIRECT  Additional studies/ records that were reviewed today include:   Echocardiogram: 10/26/16 Study Conclusions  - Left ventricle: The cavity size was normal. Wall thickness was   increased in a pattern of mild LVH. Systolic function was normal.  The estimated ejection fraction was in the range of 60% to 65%.   Wall motion was normal; there were no regional wall motion   abnormalities. - Mitral valve: There was mild regurgitation. - Pulmonary arteries: Systolic pressure was mildly increased. PA   peak pressure: 35 mm Hg (S).  Left Heart Cath and Coronary Angiography  10/25/16  Conclusion    Widely patent coronary arteries without evidence of obstructive coronary disease.  Normal left ventricular systolic function with moderate elevation in end diastolic pressure consistent with chronic diastolic heart failure. LVEDP is between 22 and 27 mmHg, possibly aggravated by a presence of atrial fibrillation.  RECOMMENDATIONS:   Etiology of hypotension and ST depression is unclear, but not related to obstructive coronary disease. Could have been an anesthesia effect.  A. fib management possibly requiring rhythm control given elevated end-diastolic pressure. Continue chronic anticoagulation therapy.       ASSESSMENT & PLAN:    1. PAF - Maintaining  sinus rhythm today. Patient has intermittent palpitation this dyspnea. Might be going in and out of Afib. Echo with normal EF. Will restart Cardizem CD 120mg  QD. She will let us know if no improvement. Might consider monitor at that time vs up titration of CCB and check TSH.   2. HTN - Stable and well controled.   Follow up with Dr. Basilio Cairo in 3-4 weeks.   Medication Adjustments/Labs and Tests Ordered: Current medicines are reviewed at length with the patient today.  Concerns regarding medicines are outlined above.  Medication changes, Labs and Tests ordered today are listed in the Patient Instructions below. Patient Instructions  Your physician has recommended you make the following change in your medication:  1.) start Cardizem CD (diltiazem) 120 mg once a day  Your physician recommends that you schedule a follow-up appointment in: 3-4 with Dr. Geraldo Pitter in [facility].      Jarrett Soho, [place]  11/27/2016 9:08 AM    [facility] [place], [place], [place]  [address] Phone: [phone]; Fax: [phone]

## 2016-11-27 NOTE — Patient Instructions (Signed)
Your physician has recommended you make the following change in your medication:  1.) start Cardizem CD (diltiazem) 120 mg once a day  Your physician recommends that you schedule a follow-up appointment in: 3-4 with Dr. Geraldo Pitter in [facility].

## 2016-12-03 DIAGNOSIS — M545 Low back pain: Secondary | ICD-10-CM | POA: Diagnosis not present

## 2016-12-03 DIAGNOSIS — M6281 Muscle weakness (generalized): Secondary | ICD-10-CM | POA: Diagnosis not present

## 2016-12-03 DIAGNOSIS — R262 Difficulty in walking, not elsewhere classified: Secondary | ICD-10-CM | POA: Diagnosis not present

## 2016-12-03 DIAGNOSIS — M25659 Stiffness of unspecified hip, not elsewhere classified: Secondary | ICD-10-CM | POA: Diagnosis not present

## 2016-12-06 DIAGNOSIS — M6281 Muscle weakness (generalized): Secondary | ICD-10-CM | POA: Diagnosis not present

## 2016-12-06 DIAGNOSIS — M545 Low back pain: Secondary | ICD-10-CM | POA: Diagnosis not present

## 2016-12-06 DIAGNOSIS — R262 Difficulty in walking, not elsewhere classified: Secondary | ICD-10-CM | POA: Diagnosis not present

## 2016-12-06 DIAGNOSIS — M25659 Stiffness of unspecified hip, not elsewhere classified: Secondary | ICD-10-CM | POA: Diagnosis not present

## 2016-12-09 DIAGNOSIS — Z1231 Encounter for screening mammogram for malignant neoplasm of breast: Secondary | ICD-10-CM | POA: Diagnosis not present

## 2016-12-24 DIAGNOSIS — E119 Type 2 diabetes mellitus without complications: Secondary | ICD-10-CM | POA: Diagnosis not present

## 2016-12-25 ENCOUNTER — Encounter: Payer: Self-pay | Admitting: Cardiology

## 2016-12-25 ENCOUNTER — Ambulatory Visit (INDEPENDENT_AMBULATORY_CARE_PROVIDER_SITE_OTHER): Payer: Medicare Other | Admitting: Cardiology

## 2016-12-25 VITALS — BP 130/60 | HR 67 | Ht 63.0 in | Wt 252.1 lb

## 2016-12-25 DIAGNOSIS — I10 Essential (primary) hypertension: Secondary | ICD-10-CM | POA: Diagnosis not present

## 2016-12-25 DIAGNOSIS — E876 Hypokalemia: Secondary | ICD-10-CM | POA: Diagnosis not present

## 2016-12-25 DIAGNOSIS — Z7901 Long term (current) use of anticoagulants: Secondary | ICD-10-CM | POA: Diagnosis not present

## 2016-12-25 DIAGNOSIS — I480 Paroxysmal atrial fibrillation: Secondary | ICD-10-CM | POA: Diagnosis not present

## 2016-12-25 MED ORDER — DILTIAZEM HCL ER COATED BEADS 120 MG PO CP24: 120.0000 mg | ORAL_CAPSULE | Freq: Every day | ORAL | 3 refills | Status: DC

## 2016-12-25 MED ORDER — RIVAROXABAN 20 MG PO TABS: 20.0000 mg | ORAL_TABLET | Freq: Every day | ORAL | 3 refills | Status: DC

## 2016-12-25 NOTE — Patient Instructions (Signed)
Medication Instructions:  Your physician recommends that you continue on your current medications as directed. Please refer to the Current Medication list given to you today.    Labwork: Your physician recommends that you return for lab work in: Today, BMP and CBC  Testing/Procedures:  NONE  Follow-Up:  Your physician recommends that you schedule a follow-up appointment in: 6 months with Dr. Geraldo Pitter.   Any Other Special Instructions Will Be Listed Below (If Applicable).     If you need a refill on your cardiac medications before your next appointment, please call your pharmacy.

## 2016-12-25 NOTE — Progress Notes (Signed)
Cardiology Office Note:    Date:  12/25/2016   ID:  [name], DOB 11/10/1950, MRN [id number]  PCP:  Nicholos Johns, MD  Cardiologist:  Jenean Lindau, MD   Referring MD: Nicholos Johns, MD    ASSESSMENT:    1. Essential hypertension   2. PAF (paroxysmal atrial fibrillation) ([place])   3. Hypokalemia   4. Current use of long term anticoagulation    PLAN:    In order of problems listed above:  1. I discussed with the patient atrial fibrillation, disease process. Management and therapy including rate and rhythm control, anticoagulation benefits and potential risks were discussed extensively with the patient. Patient had multiple questions which were answered to patient's satisfaction. 2. Patient's heart rate is well controlled and blood pressure is fine. Diet was discussed with dyslipidemia, diabetes mellitus and obesity and she vocalized understanding. Risks of obesity explained and she plans to work on reducing weight. She will be seen in follow-up appointment in 6 months or earlier if she has any concerns. Her Xarelto and calcium channel blocker was refilled today.   Medication Adjustments/Labs and Tests Ordered: Current medicines are reviewed at length with the patient today.  Concerns regarding medicines are outlined above.  No orders of the defined types were placed in this encounter.  No orders of the defined types were placed in this encounter.    History of Present Illness:    [name] [name] is a 66 y.o. female who is being seen today for the evaluation of Paroxysmal atrial fibrillation at the request of Nicholos Johns, MD. Patient is a pleasant 66 year old female. She has past medical history of essential hypertension, paroxysmal fibrillation. She mentions to me that she is a diet-controlled diabetic. She is here for follow-up. She denies any problems at this time and takes care of activities of daily living. No chest pain orthopnea or PND. She's had history of stroke. At  the time of my evaluation she is alert awake oriented and in no distress. Her daughter accompanies her for this visit.  Past Medical History:  Diagnosis Date  . Arthritis   . Hypertension   . MI (myocardial infarction) ([place])   . Pre-diabetes   . Stroke ([place])   . Thyroid disease     Past Surgical History:  Procedure Laterality Date  . LEFT HEART CATH AND CORONARY ANGIOGRAPHY N/A 10/25/2016   Procedure: Left Heart Cath and Coronary Angiography;  Surgeon: Belva Crome, MD;  Location: [place] [facility];  Service: Cardiovascular;  Laterality: N/A;  . LUMBAR LAMINECTOMY/DECOMPRESSION MICRODISCECTOMY N/A 10/23/2016   Procedure: LUMBAR LAMINECTOMY/DECOMPRESSION MICRODISCECTOMY;  Surgeon: Latanya Maudlin, MD;  Location: [facility];  Service: Orthopedics;  Laterality: N/A;  . ULTRASOUND GUIDANCE FOR VASCULAR ACCESS  10/25/2016   Procedure: Ultrasound Guidance For Vascular Access;  Surgeon: Belva Crome, MD;  Location: [place] [facility];  Service: Cardiovascular;;    Current Medications: Current Meds  Medication Sig  . albuterol (PROVENTIL HFA;VENTOLIN HFA) 108 (90 BASE) MCG/ACT inhaler Inhale 2 puffs into the lungs every 6 (six) hours as needed for wheezing or shortness of breath.  Marland Kitchen aspirin EC 81 MG tablet Take 81 mg by mouth daily.  . budesonide-formoterol (SYMBICORT) 160-4.5 MCG/ACT inhaler Inhale 2 puffs into the lungs 2 (two) times daily.  Marland Kitchen CALCIUM PO Take 1 tablet by mouth daily.  . cholecalciferol (VITAMIN D) 1000 UNITS tablet Take 2,000 Units by mouth daily.  . Cyanocobalamin (VITAMIN B-12 PO) Take 2,000 mg by mouth daily.  Marland Kitchen  diltiazem (CARDIZEM CD) 120 MG 24 hr capsule Take 1 capsule (120 mg total) by mouth daily.  . diphenhydramine-acetaminophen (TYLENOL PM) 25-500 MG TABS Take 2 tablets by mouth at bedtime as needed (pain/sleep).  . docusate sodium (COLACE) 100 MG capsule Take 200 mg by mouth 2 (two) times daily as needed for mild constipation.  . fluticasone (FLONASE) 50 MCG/ACT  nasal spray Place 2 sprays into both nostrils daily as needed for allergies or rhinitis.  . furosemide (LASIX) 40 MG tablet Take 40 mg by mouth 2 (two) times daily.   . Glucos-Chondroit-Hyaluron-MSM (GLUCOSAMINE CHONDROITIN JOINT PO) Take 1 tablet by mouth 2 (two) times daily.  Marland Kitchen HYDROcodone-acetaminophen (NORCO) 10-325 MG tablet Take 1 tablet by mouth every 4 (four) hours as needed (breakthrough pain).  Marland Kitchen levothyroxine (SYNTHROID, LEVOTHROID) 112 MCG tablet Take 112 mcg by mouth daily before breakfast.  . losartan-hydrochlorothiazide (HYZAAR) 100-25 MG per tablet Take 1 tablet by mouth daily.  . methocarbamol (ROBAXIN) 500 MG tablet Take 1 tablet (500 mg total) by mouth every 6 (six) hours as needed for muscle spasms.  Marland Kitchen omega-3 acid ethyl esters (LOVAZA) 1 G capsule Take 4 g by mouth daily.  Marland Kitchen omeprazole (PRILOSEC) 20 MG capsule Take 20 mg by mouth daily.  . potassium chloride SA (K-DUR,KLOR-CON) 20 MEQ tablet Take 100-120 mEq by mouth See admin instructions. Pt to take 6 tablets in the morning, and 5 tablets in the evening  . rivaroxaban (XARELTO) 20 MG TABS tablet Take 20 mg by mouth every morning.  . venlafaxine XR (EFFEXOR-XR) 75 MG 24 hr capsule Take 75 mg by mouth daily with breakfast.  . Vitamin D, Ergocalciferol, (DRISDOL) 50000 units CAPS capsule Take 50,000 Units by mouth every Sunday.     Allergies:   Sulfonamide derivatives   Social History   Social History  . Marital status: Widowed    Spouse name: N/A  . Number of children: N/A  . Years of education: N/A   Social History Main Topics  . Smoking status: Never Smoker  . Smokeless tobacco: Never Used  . Alcohol use No  . Drug use: No  . Sexual activity: Not Asked   Other Topics Concern  . None   Social History Narrative  . None     Family History: The patient's family history includes Hypertension in her father and mother.  ROS:   Please see the history of present illness.    All other systems reviewed and are  negative.  EKGs/Labs/Other Studies Reviewed:    The following studies were reviewed today: I reviewed office records extensively and discussed my findings with the patient. EKG done reveals sinus rhythm with nonspecific ST-T changes.   Recent Labs: 10/18/2016: ALT 22 10/26/2016: BUN 12; Creatinine, Ser 0.89; Hemoglobin 9.5; Platelets 162; Potassium 3.4; Sodium 140  Recent Lipid Panel No results found for: CHOL, TRIG, HDL, CHOLHDL, VLDL, LDLCALC, LDLDIRECT  Physical Exam:    VS:  BP 130/60   Pulse 67   Ht 5\' 3"  (1.6 m)   Wt 252 lb 1.9 oz (114.4 kg)   SpO2 98%   BMI 44.66 kg/m     Wt Readings from Last 3 Encounters:  12/25/16 252 lb 1.9 oz (114.4 kg)  11/27/16 253 lb (114.8 kg)  10/26/16 276 lb 14.4 oz (125.6 kg)     GEN: Patient is in no acute distress HEENT: Normal NECK: No JVD; No carotid bruits LYMPHATICS: No lymphadenopathy CARDIAC: S1 S2 regular, 2/6 systolic murmur at the apex. RESPIRATORY:  Clear to auscultation without rales, wheezing or rhonchi  ABDOMEN: Soft, non-tender, non-distended MUSCULOSKELETAL:  No edema; No deformity  SKIN: Warm and dry NEUROLOGIC:  Alert and oriented x 3 PSYCHIATRIC:  Normal affect    Signed, Jenean Lindau, MD  12/25/2016 10:30 AM    [facility]

## 2016-12-26 ENCOUNTER — Telehealth: Payer: Self-pay | Admitting: Cardiology

## 2016-12-26 LAB — CBC WITH DIFFERENTIAL/PLATELET
BASOS ABS: 0 10*3/uL (ref 0.0–0.2)
Basos: 0 %
EOS (ABSOLUTE): 0.2 10*3/uL (ref 0.0–0.4)
Eos: 3 %
HEMOGLOBIN: 10.3 g/dL — AB (ref 11.1–15.9)
Hematocrit: 31.9 % — ABNORMAL LOW (ref 34.0–46.6)
IMMATURE GRANS (ABS): 0 10*3/uL (ref 0.0–0.1)
IMMATURE GRANULOCYTES: 0 %
LYMPHS: 31 %
Lymphocytes Absolute: 2.2 10*3/uL (ref 0.7–3.1)
MCH: 24.7 pg — ABNORMAL LOW (ref 26.6–33.0)
MCHC: 32.3 g/dL (ref 31.5–35.7)
MCV: 77 fL — ABNORMAL LOW (ref 79–97)
MONOCYTES: 9 %
Monocytes Absolute: 0.6 10*3/uL (ref 0.1–0.9)
NEUTROS PCT: 57 %
Neutrophils Absolute: 4.1 10*3/uL (ref 1.4–7.0)
Platelets: 203 10*3/uL (ref 150–379)
RBC: 4.17 x10E6/uL (ref 3.77–5.28)
RDW: 16.4 % — ABNORMAL HIGH (ref 12.3–15.4)
WBC: 7 10*3/uL (ref 3.4–10.8)

## 2016-12-26 LAB — BASIC METABOLIC PANEL
BUN/Creatinine Ratio: 10 — ABNORMAL LOW (ref 12–28)
BUN: 9 mg/dL (ref 8–27)
CALCIUM: 9.4 mg/dL (ref 8.7–10.3)
CHLORIDE: 101 mmol/L (ref 96–106)
CO2: 25 mmol/L (ref 20–29)
Creatinine, Ser: 0.9 mg/dL (ref 0.57–1.00)
GFR calc Af Amer: 77 mL/min/{1.73_m2} (ref >59)
GFR calc non Af Amer: 67 mL/min/{1.73_m2} (ref >59)
GLUCOSE: 98 mg/dL (ref 65–99)
Potassium: 4.1 mmol/L (ref 3.5–5.2)
Sodium: 142 mmol/L (ref 134–144)

## 2016-12-26 NOTE — Telephone Encounter (Signed)
Error

## 2017-01-24 DIAGNOSIS — Z1389 Encounter for screening for other disorder: Secondary | ICD-10-CM | POA: Diagnosis not present

## 2017-01-24 DIAGNOSIS — Z0000 Encounter for general adult medical examination without abnormal findings: Secondary | ICD-10-CM | POA: Diagnosis not present

## 2017-01-24 DIAGNOSIS — E785 Hyperlipidemia, unspecified: Secondary | ICD-10-CM | POA: Diagnosis not present

## 2017-01-24 DIAGNOSIS — Z9181 History of falling: Secondary | ICD-10-CM | POA: Diagnosis not present

## 2017-02-13 DIAGNOSIS — Z4789 Encounter for other orthopedic aftercare: Secondary | ICD-10-CM | POA: Diagnosis not present

## 2017-02-13 DIAGNOSIS — M5441 Lumbago with sciatica, right side: Secondary | ICD-10-CM | POA: Diagnosis not present

## 2017-02-28 DIAGNOSIS — Z23 Encounter for immunization: Secondary | ICD-10-CM | POA: Diagnosis not present

## 2017-04-09 DIAGNOSIS — Z79899 Other long term (current) drug therapy: Secondary | ICD-10-CM | POA: Diagnosis not present

## 2017-04-09 DIAGNOSIS — E559 Vitamin D deficiency, unspecified: Secondary | ICD-10-CM | POA: Diagnosis not present

## 2017-04-09 DIAGNOSIS — Z1331 Encounter for screening for depression: Secondary | ICD-10-CM | POA: Diagnosis not present

## 2017-04-09 DIAGNOSIS — I10 Essential (primary) hypertension: Secondary | ICD-10-CM | POA: Diagnosis not present

## 2017-04-09 DIAGNOSIS — J309 Allergic rhinitis, unspecified: Secondary | ICD-10-CM | POA: Diagnosis not present

## 2017-04-09 DIAGNOSIS — E119 Type 2 diabetes mellitus without complications: Secondary | ICD-10-CM | POA: Diagnosis not present

## 2017-04-09 DIAGNOSIS — E785 Hyperlipidemia, unspecified: Secondary | ICD-10-CM | POA: Diagnosis not present

## 2017-04-09 DIAGNOSIS — E039 Hypothyroidism, unspecified: Secondary | ICD-10-CM | POA: Diagnosis not present

## 2017-05-07 IMAGING — DX DG FOOT COMPLETE 3+V*R*
3 series · 3 of 3 positions shown · non-contrast
Comparison: None.

CLINICAL DATA: Right foot caught in wheelchair. Pain and swelling
along anterior right foot

EXAM:
RIGHT FOOT COMPLETE - 3+ VIEW

[foot ap]
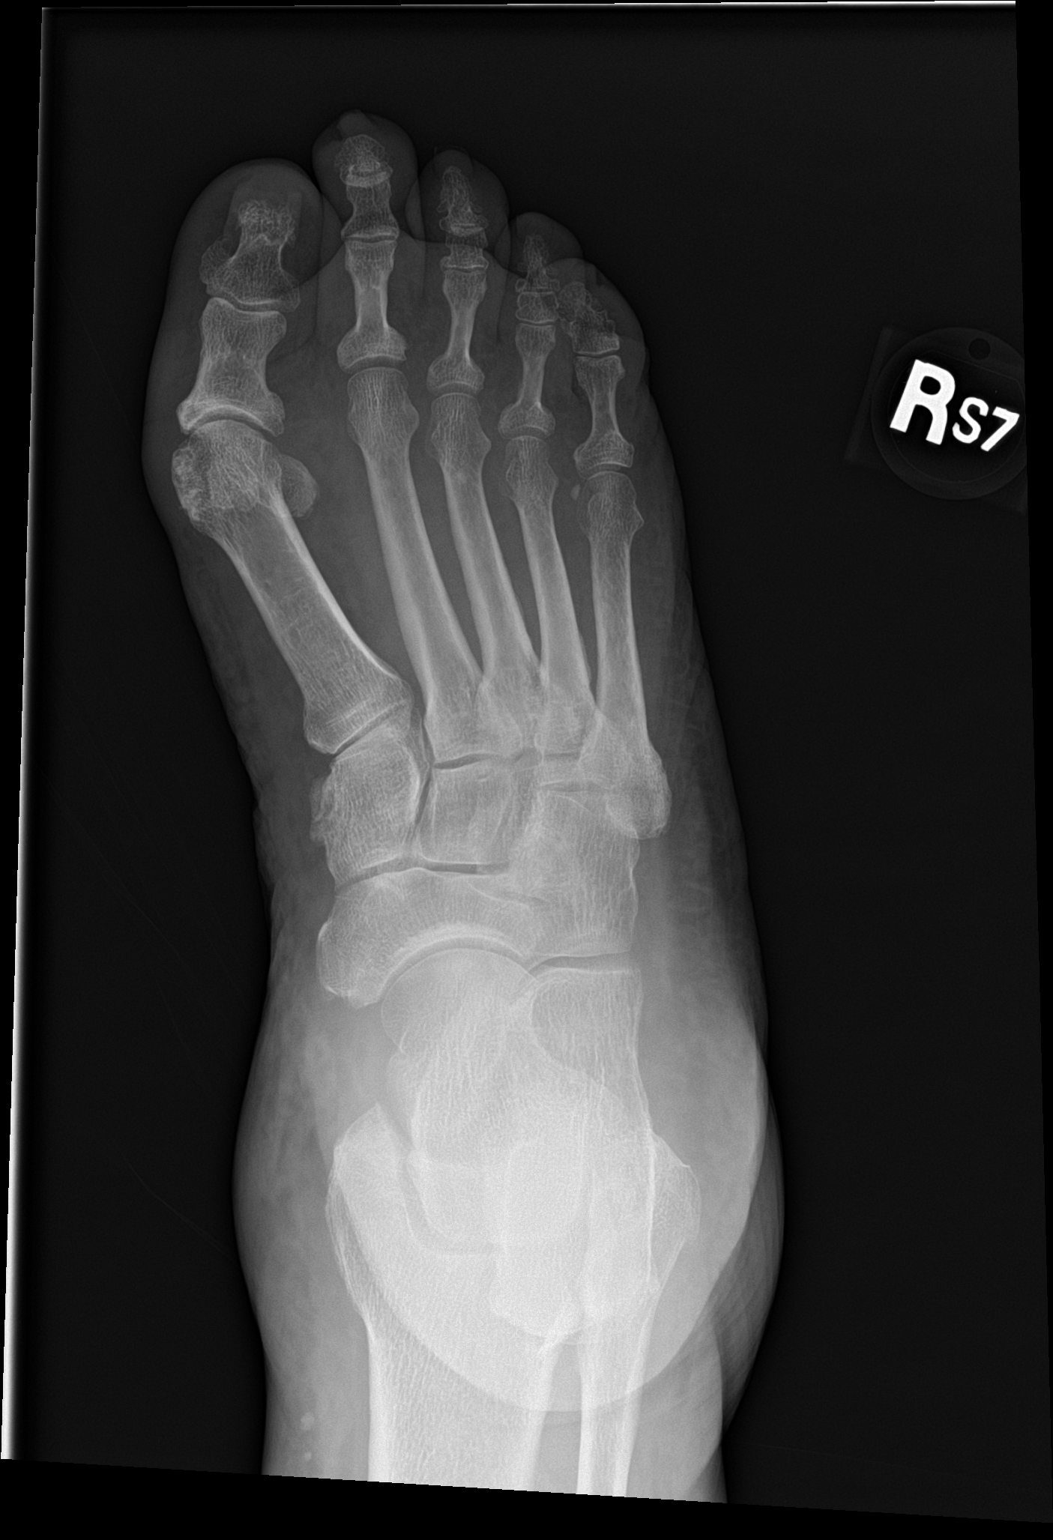

[foot obl]
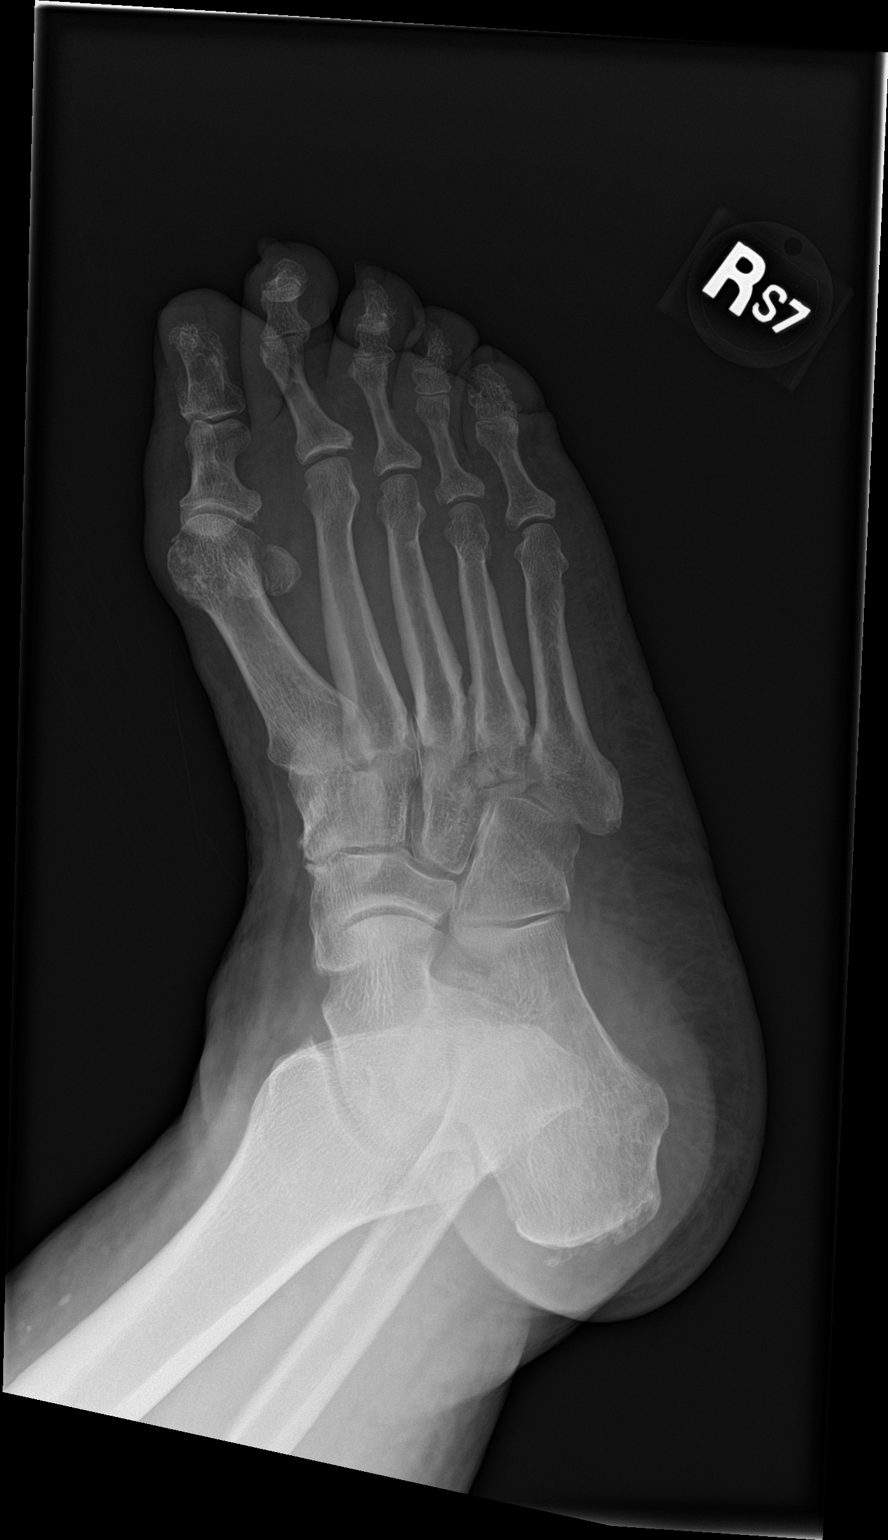

[foot lat]
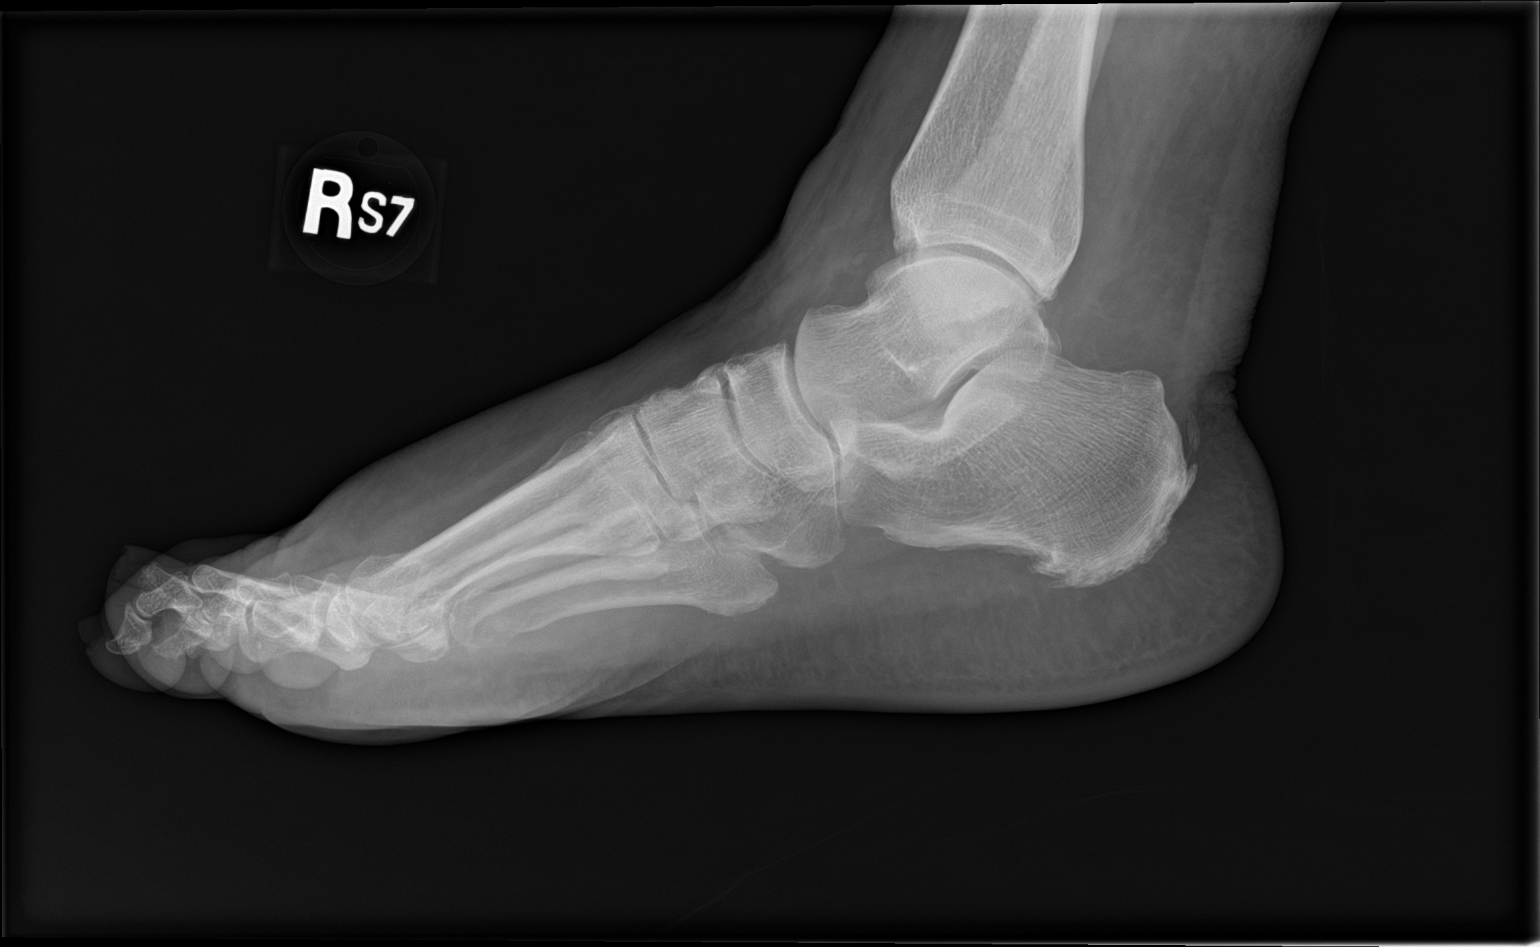

[3 of 3 positions shown; findings below may reference images not displayed]

FINDINGS: No acute bony abnormality. Specifically, no fracture, subluxation,
or dislocation. Soft tissues are intact. Soft tissue swelling
overlying the metatarsals.
IMPRESSION: No acute bony abnormality.

## 2017-07-31 ENCOUNTER — Ambulatory Visit: Payer: Self-pay | Admitting: Cardiology

## 2017-08-06 ENCOUNTER — Encounter: Payer: Self-pay | Admitting: Cardiology

## 2017-08-06 ENCOUNTER — Ambulatory Visit (INDEPENDENT_AMBULATORY_CARE_PROVIDER_SITE_OTHER): Payer: Medicare Other | Admitting: Cardiology

## 2017-08-06 ENCOUNTER — Ambulatory Visit: Payer: Self-pay | Admitting: Cardiology

## 2017-08-06 VITALS — BP 130/80 | HR 64 | Ht 63.0 in | Wt 254.1 lb

## 2017-08-06 DIAGNOSIS — Z7901 Long term (current) use of anticoagulants: Secondary | ICD-10-CM

## 2017-08-06 DIAGNOSIS — I10 Essential (primary) hypertension: Secondary | ICD-10-CM | POA: Diagnosis not present

## 2017-08-06 DIAGNOSIS — I480 Paroxysmal atrial fibrillation: Secondary | ICD-10-CM

## 2017-08-06 NOTE — Progress Notes (Signed)
Cardiology Office Note:    Date:  08/06/2017   ID:  [name] [name], DOB 03-19-1951, MRN [id number]  PCP:  Nicholos Johns, MD  Cardiologist:  Jenean Lindau, MD   Referring MD: Nicholos Johns, MD    ASSESSMENT:    1. Essential hypertension   2. PAF (paroxysmal atrial fibrillation) (HCC)   3. Current use of long term anticoagulation    PLAN:    In order of problems listed above:  1. Primary prevention stressed with the patient.  Importance of compliance with diet and medications stressed and she vocalized understanding.  Her blood pressure stable. 2. Patient mentions to me that she feels tired at times and therefore of asked her to keep a track of her blood pressure twice a day and send it was in a week.  She plans to have blood work with her primary care physician in the next week or two. 3. I discussed with the patient atrial fibrillation, disease process. Management and therapy including rate and rhythm control, anticoagulation benefits and potential risks were discussed extensively with the patient. Patient had multiple questions which were answered to patient's satisfaction. 4. Patient will be seen in follow-up appointment in 6 months or earlier if the patient has any concerns    Medication Adjustments/Labs and Tests Ordered: Current medicines are reviewed at length with the patient today.  Concerns regarding medicines are outlined above.  No orders of the defined types were placed in this encounter.  No orders of the defined types were placed in this encounter.    Chief Complaint  Patient presents with  . Follow-up  . Atrial Fibrillation     History of Present Illness:    [name] [name] is a 67 y.o. female.  The patient has history of essential hypertension, and paroxysmal fibrillation.  She is overweight also.  She lists her sedentary lifestyle because of orthopedic issues.  Denies any problems at this time and takes care of activities of daily living.  No chest pain  orthopnea or PND.  She has not experienced any palpitations since the last visit.  Past Medical History:  Diagnosis Date  . Arthritis   . Hypertension   . MI (myocardial infarction) ([place])   . Pre-diabetes   . Stroke ([place])   . Thyroid disease     Past Surgical History:  Procedure Laterality Date  . LEFT HEART CATH AND CORONARY ANGIOGRAPHY N/A 10/25/2016   Procedure: Left Heart Cath and Coronary Angiography;  Surgeon: Belva Crome, MD;  Location: [place] [facility];  Service: Cardiovascular;  Laterality: N/A;  . LUMBAR LAMINECTOMY/DECOMPRESSION MICRODISCECTOMY N/A 10/23/2016   Procedure: LUMBAR LAMINECTOMY/DECOMPRESSION MICRODISCECTOMY;  Surgeon: Latanya Maudlin, MD;  Location: [facility];  Service: Orthopedics;  Laterality: N/A;  . ULTRASOUND GUIDANCE FOR VASCULAR ACCESS  10/25/2016   Procedure: Ultrasound Guidance For Vascular Access;  Surgeon: Belva Crome, MD;  Location: [place] [facility];  Service: Cardiovascular;;    Current Medications: Current Meds  Medication Sig  . albuterol (PROVENTIL HFA;VENTOLIN HFA) 108 (90 BASE) MCG/ACT inhaler Inhale 2 puffs into the lungs every 6 (six) hours as needed for wheezing or shortness of breath.  Marland Kitchen aspirin EC 81 MG tablet Take 81 mg by mouth daily.  . budesonide-formoterol (SYMBICORT) 160-4.5 MCG/ACT inhaler Inhale 2 puffs into the lungs 2 (two) times daily.  Marland Kitchen CALCIUM PO Take 1 tablet by mouth daily.  . cholecalciferol (VITAMIN D) 1000 UNITS tablet Take 2,000 Units by mouth daily.  . Cyanocobalamin (VITAMIN  B-12 PO) Take 2,000 mg by mouth daily.  Marland Kitchen diltiazem (CARDIZEM CD) 120 MG 24 hr capsule Take 1 capsule (120 mg total) by mouth daily.  . diphenhydramine-acetaminophen (TYLENOL PM) 25-500 MG TABS Take 2 tablets by mouth at bedtime as needed (pain/sleep).  . docusate sodium (COLACE) 100 MG capsule Take 200 mg by mouth 2 (two) times daily as needed for mild constipation.  . fluticasone (FLONASE) 50 MCG/ACT nasal spray Place 2 sprays into both  nostrils daily as needed for allergies or rhinitis.  . furosemide (LASIX) 40 MG tablet Take 40 mg by mouth 2 (two) times daily.   . Glucos-Chondroit-Hyaluron-MSM (GLUCOSAMINE CHONDROITIN JOINT PO) Take 1 tablet by mouth 2 (two) times daily.  Marland Kitchen HYDROcodone-acetaminophen (NORCO) 10-325 MG tablet Take 1 tablet by mouth every 4 (four) hours as needed (breakthrough pain).  Marland Kitchen levothyroxine (SYNTHROID, LEVOTHROID) 112 MCG tablet Take 112 mcg by mouth daily before breakfast.  . losartan-hydrochlorothiazide (HYZAAR) 100-25 MG per tablet Take 1 tablet by mouth daily.  . methocarbamol (ROBAXIN) 500 MG tablet Take 1 tablet (500 mg total) by mouth every 6 (six) hours as needed for muscle spasms.  Marland Kitchen omega-3 acid ethyl esters (LOVAZA) 1 G capsule Take 4 g by mouth daily.  Marland Kitchen omeprazole (PRILOSEC) 10 MG capsule   . omeprazole (PRILOSEC) 20 MG capsule Take 20 mg by mouth daily.  . potassium chloride SA (K-DUR,KLOR-CON) 20 MEQ tablet Take 100-120 mEq by mouth See admin instructions. Pt to take 6 tablets in the morning, and 5 tablets in the evening  . rivaroxaban (XARELTO) 20 MG TABS tablet Take 1 tablet (20 mg total) by mouth daily.  Marland Kitchen venlafaxine XR (EFFEXOR-XR) 75 MG 24 hr capsule Take 75 mg by mouth daily with breakfast.  . Vitamin D, Ergocalciferol, (DRISDOL) 50000 units CAPS capsule Take 50,000 Units by mouth every Sunday.     Allergies:   Sulfonamide derivatives   Social History   Socioeconomic History  . Marital status: Widowed    Spouse name: Not on file  . Number of children: Not on file  . Years of education: Not on file  . Highest education level: Not on file  Occupational History  . Not on file  Social Needs  . Financial resource strain: Not on file  . Food insecurity:    Worry: Not on file    Inability: Not on file  . Transportation needs:    Medical: Not on file    Non-medical: Not on file  Tobacco Use  . Smoking status: Never Smoker  . Smokeless tobacco: Never Used  Substance and  Sexual Activity  . Alcohol use: No  . Drug use: No  . Sexual activity: Not on file  Lifestyle  . Physical activity:    Days per week: Not on file    Minutes per session: Not on file  . Stress: Not on file  Relationships  . Social connections:    Talks on phone: Not on file    Gets together: Not on file    Attends religious service: Not on file    Active member of club or organization: Not on file    Attends meetings of clubs or organizations: Not on file    Relationship status: Not on file  Other Topics Concern  . Not on file  Social History Narrative  . Not on file     Family History: The patient's family history includes Hypertension in her father and mother.  ROS:   Please see the history  of present illness.    All other systems reviewed and are negative.  EKGs/Labs/Other Studies Reviewed:    The following studies were reviewed today: I reviewed prior office visit records.   Recent Labs: 10/18/2016: ALT 22 12/25/2016: BUN 9; Creatinine, Ser 0.90; Hemoglobin 10.3; Platelets 203; Potassium 4.1; Sodium 142  Recent Lipid Panel No results found for: CHOL, TRIG, HDL, CHOLHDL, VLDL, LDLCALC, LDLDIRECT  Physical Exam:    VS:  BP 130/80 (BP Location: Right Arm, Patient Position: Sitting, Cuff Size: Normal)   Pulse 64   Ht 5\' 3"  (1.6 m)   Wt 254 lb 1.9 oz (115.3 kg)   SpO2 98%   BMI 45.02 kg/m     Wt Readings from Last 3 Encounters:  08/06/17 254 lb 1.9 oz (115.3 kg)  12/25/16 252 lb 1.9 oz (114.4 kg)  11/27/16 253 lb (114.8 kg)     GEN: Patient is in no acute distress HEENT: Normal NECK: No JVD; No carotid bruits LYMPHATICS: No lymphadenopathy CARDIAC: Hear sounds regular, 2/6 systolic murmur at the apex. RESPIRATORY:  Clear to auscultation without rales, wheezing or rhonchi  ABDOMEN: Soft, non-tender, non-distended MUSCULOSKELETAL:  No edema; No deformity  SKIN: Warm and dry NEUROLOGIC:  Alert and oriented x 3 PSYCHIATRIC:  Normal affect   Signed, Jenean Lindau, MD  08/06/2017 11:18 AM    [place]

## 2017-08-06 NOTE — Patient Instructions (Signed)
Medication Instructions:  Your physician recommends that you continue on your current medications as directed. Please refer to the Current Medication list given to you today.  Labwork: None  Testing/Procedures: None  Follow-Up: Your physician recommends that you schedule a follow-up appointment in: 6 months  Any Other Special Instructions Will Be Listed Below (If Applicable).   Please have your blood work sent to [place] at [phone]   If you need a refill on your cardiac medications before your next appointment, please call your pharmacy.   [place], RN, BSN

## 2017-08-13 DIAGNOSIS — E039 Hypothyroidism, unspecified: Secondary | ICD-10-CM | POA: Diagnosis not present

## 2017-08-13 DIAGNOSIS — E1140 Type 2 diabetes mellitus with diabetic neuropathy, unspecified: Secondary | ICD-10-CM | POA: Diagnosis not present

## 2017-08-13 DIAGNOSIS — J309 Allergic rhinitis, unspecified: Secondary | ICD-10-CM | POA: Diagnosis not present

## 2017-08-13 DIAGNOSIS — Z79899 Other long term (current) drug therapy: Secondary | ICD-10-CM | POA: Diagnosis not present

## 2017-08-13 DIAGNOSIS — E785 Hyperlipidemia, unspecified: Secondary | ICD-10-CM | POA: Diagnosis not present

## 2017-08-13 DIAGNOSIS — I10 Essential (primary) hypertension: Secondary | ICD-10-CM | POA: Diagnosis not present

## 2017-08-14 DIAGNOSIS — M1711 Unilateral primary osteoarthritis, right knee: Secondary | ICD-10-CM | POA: Diagnosis not present

## 2017-08-14 DIAGNOSIS — M1712 Unilateral primary osteoarthritis, left knee: Secondary | ICD-10-CM | POA: Diagnosis not present

## 2017-08-14 DIAGNOSIS — M170 Bilateral primary osteoarthritis of knee: Secondary | ICD-10-CM | POA: Diagnosis not present

## 2017-08-27 ENCOUNTER — Telehealth: Payer: Self-pay

## 2017-08-27 NOTE — Telephone Encounter (Signed)
Informed patient that her blood pressure log was received and reviewed by Dr. Geraldo Pitter. No changed to currently be made.

## 2017-09-01 DIAGNOSIS — M1712 Unilateral primary osteoarthritis, left knee: Secondary | ICD-10-CM | POA: Diagnosis not present

## 2017-09-01 DIAGNOSIS — J309 Allergic rhinitis, unspecified: Secondary | ICD-10-CM | POA: Diagnosis not present

## 2017-09-01 DIAGNOSIS — E876 Hypokalemia: Secondary | ICD-10-CM | POA: Diagnosis not present

## 2017-09-04 DIAGNOSIS — E876 Hypokalemia: Secondary | ICD-10-CM | POA: Diagnosis not present

## 2017-09-23 DIAGNOSIS — M7989 Other specified soft tissue disorders: Secondary | ICD-10-CM | POA: Diagnosis not present

## 2017-09-23 DIAGNOSIS — E876 Hypokalemia: Secondary | ICD-10-CM | POA: Diagnosis not present

## 2017-09-23 DIAGNOSIS — M1710 Unilateral primary osteoarthritis, unspecified knee: Secondary | ICD-10-CM | POA: Diagnosis not present

## 2017-10-09 DIAGNOSIS — R109 Unspecified abdominal pain: Secondary | ICD-10-CM | POA: Diagnosis not present

## 2017-10-09 DIAGNOSIS — E039 Hypothyroidism, unspecified: Secondary | ICD-10-CM | POA: Diagnosis not present

## 2017-10-09 DIAGNOSIS — I4510 Unspecified right bundle-branch block: Secondary | ICD-10-CM | POA: Diagnosis not present

## 2017-10-09 DIAGNOSIS — J45909 Unspecified asthma, uncomplicated: Secondary | ICD-10-CM | POA: Diagnosis not present

## 2017-10-09 DIAGNOSIS — I10 Essential (primary) hypertension: Secondary | ICD-10-CM | POA: Diagnosis not present

## 2017-12-11 ENCOUNTER — Other Ambulatory Visit: Payer: Self-pay

## 2017-12-11 NOTE — Patient Outreach (Signed)
[facility] [facility]) Care Management  12/11/2017  [name] [name] 05/29/1950 [id number]   Referral Date: 12/01/17 Referral Source: EMMI prevent Referral Reason: Engagement tool   Outreach Attempt: spoke with patient.  She is able to verify HIPAA.  Patient expresses interest in [facility] services.  Patient lives alone but has family support.  Patient admits to HTN and A. Fib.  Patient reports that her doctor wants her to see kidney specialist but will be following up with her later this month.  Patient reports that sometimes she does not do a good job in controlling her blood pressure and agrees to support around her blood pressure.    Patient uses [employer] for transportation but wanted other means of transportation.  Discussed with patient [facility] logistic care for transportation.  Patient does not utilize that.  Contact information given for that.  Advised patient that I would do a social work referral for any other transportation resources in her area.  Patient is agreeable.    Patient takes medication as prescribed.  Patient expressed paying more for medication than she wanted to.  She receives some medication through mail order and others through her local pharmacy. Patient reports paying $13.00 for a 90 day supply of her depression medication that she gets from the local pharmacy.  Advised patient that there are no patient assistance programs for generic medications but she might want to try mail order for all her medication as it is a cheaper option.  She verbalized understanding.      Consent: Discussed with patient [facility] services and patient agreeable to health coach and social worker.     Plan:RN CM will refer to health coach and social worker.  Jone Baseman, RN, MSN [facility] Care Management Care Management Coordinator Direct Line [phone] Toll Free: [phone]  Fax: [phone]

## 2017-12-19 ENCOUNTER — Other Ambulatory Visit: Payer: Self-pay | Admitting: *Deleted

## 2017-12-19 NOTE — Patient Outreach (Signed)
[name]) Care Management  12/19/2017  [name] [name] Jan 17, 1951 [id number]   RN Health Coach Initial Assessment  Referral Date:  12/11/2017 Referral Source:  EMMI Prevent Screening Reason for Referral:  Disease Management Education Insurance:  [employer]   Outreach Attempt:  Successful telephone outreach to patient for introduction call.  HIPAA verified with patient.  RN Health Coach introduced self and role.  Patient verbally agrees to monthly telephone outreaches.  Plan:  RN Health Coach will make outreach to complete initial telephone assessment within the next 10 business days.   [place] [phone] [name]

## 2017-12-23 ENCOUNTER — Other Ambulatory Visit: Payer: Self-pay | Admitting: *Deleted

## 2017-12-23 ENCOUNTER — Encounter: Payer: Self-pay | Admitting: *Deleted

## 2017-12-23 NOTE — Patient Outreach (Signed)
[facility] [place] [facility]) Care Management  [place]  12/24/2017   [name] [name] 03-31-1951 [id number]   RN Health Coach Initial Assessment   Referral Date:  12/11/2017 Referral Source:  EMMI Prevent Screening Reason for Referral:  Disease Management Education Insurance:  [employer]   Outreach Attempt: Successful telephone outreach to patient for initial telephone assessment.  HIPAA verified with patient.  Patient completed initial telephone assessment.  Social:  Patient lives at home alone.  Ambulates mostly with a cane and occasionally using a walker.  Denies any falls in the last year.  Reports being independent with ADLs and needing assistance with IADLs (daughter in [place] her with managing her bills).  Patient stating she uses [employer] for transportation to her medical appointments and when they are not available she gets the assistance of friends or her son, especially to out of town appointments.  Reports she still has number to [employer] for medical transportation, provided previously by [facility], and will use it in the future.  DME in the home include:  Straight cane, Rolator walker, eyeglasses, blood pressure cuff, bedside commode, shower chair with back, and scale.  Conditions:   Per chart review and discussion with patient, PMH includes but not limited to:  Arthritis, hypertension, myocardial infarction, pre-diabetes, stroke, thyroid disease, paroxysmal atrial fibrillation, lumbar laminectomy, asthma, and bronchitis.  Reports no hospitalizations or emergency room visits int he last year.  Does not monitor her blood pressure at home and states when it is checked at physician appointments, she is told it is ok.  Last Hgb A1C was 6.1 on 08/13/2017.  Does not monitor blood sugars at home.  Patient is reporting she has bronchitis flares occasionally and sometimes have to get allergy shots.  Reporting she does not have a  rescue inhaler at this time.  Encouraged patient to discuss rescue inhaler with primary care provider at next medical appointment.  Medications:  Patient reporting taking about 10 medications.  States she manages medications herself without difficulties and plans to start using a weekly pill box.  Denies any issues affording medications.  Encounter Medications:  Outpatient Encounter Medications as of 12/23/2017  Medication Sig Note  . aspirin EC 81 MG tablet Take 81 mg by mouth daily.   Marland Kitchen CALCIUM PO Take 1 tablet by mouth daily. 12/23/2017: Reports taking occasionally  . cholecalciferol (VITAMIN D) 1000 UNITS tablet Take 2,000 Units by mouth daily. 12/23/2017: Reports taking 50, 000 units weekly and 2,000 units every other day  . Cyanocobalamin (VITAMIN B-12 PO) Take 2,000 mg by mouth daily. 12/23/2017: Reports taking occasionally  . diltiazem (CARDIZEM CD) 120 MG 24 hr capsule Take 1 capsule (120 mg total) by mouth daily.   Marland Kitchen docusate sodium (COLACE) 100 MG capsule Take 200 mg by mouth 2 (two) times daily as needed for mild constipation.   . fluticasone (FLONASE) 50 MCG/ACT nasal spray Place 2 sprays into both nostrils daily as needed for allergies or rhinitis.   . furosemide (LASIX) 40 MG tablet Take 40 mg by mouth 2 (two) times daily.    Marland Kitchen gabapentin (NEURONTIN) 300 MG capsule Take 300 mg by mouth 3 (three) times daily.   Marland Kitchen levothyroxine (SYNTHROID, LEVOTHROID) 112 MCG tablet Take 112 mcg by mouth daily before breakfast.   . loratadine (CLARITIN) 10 MG tablet Take 10 mg by mouth daily as needed for allergies.   Marland Kitchen losartan-hydrochlorothiazide (HYZAAR) 100-25 MG per tablet Take 1 tablet by mouth daily.   Marland Kitchen  omega-3 acid ethyl esters (LOVAZA) 1 G capsule Take 4 g by mouth daily.   Marland Kitchen omeprazole (PRILOSEC) 20 MG capsule Take 20 mg by mouth daily.   . potassium chloride SA (K-DUR,KLOR-CON) 20 MEQ tablet Take 100-120 mEq by mouth See admin instructions. Pt to take 6 tablets in the morning, and 5 tablets  in the evening 12/23/2017: Reports taking 10 meq twice a day  . rivaroxaban (XARELTO) 20 MG TABS tablet Take 1 tablet (20 mg total) by mouth daily.   Marland Kitchen tiotropium (SPIRIVA) 18 MCG inhalation capsule Place 18 mcg into inhaler and inhale daily.   Marland Kitchen venlafaxine XR (EFFEXOR-XR) 75 MG 24 hr capsule Take 75 mg by mouth daily with breakfast.   . Vitamin D, Ergocalciferol, (DRISDOL) 50000 units CAPS capsule Take 50,000 Units by mouth every Sunday.   Marland Kitchen albuterol (PROVENTIL HFA;VENTOLIN HFA) 108 (90 BASE) MCG/ACT inhaler Inhale 2 puffs into the lungs every 6 (six) hours as needed for wheezing or shortness of breath. (Patient not taking: Reported on 12/24/2017) 12/24/2017: Reports she cannot find this inhaler  . budesonide-formoterol (SYMBICORT) 160-4.5 MCG/ACT inhaler Inhale 2 puffs into the lungs 2 (two) times daily. 12/23/2017: Reports not taking currently  . diphenhydramine-acetaminophen (TYLENOL PM) 25-500 MG TABS Take 2 tablets by mouth at bedtime as needed (pain/sleep). 12/23/2017: Reports not taking currently  . Glucos-Chondroit-Hyaluron-MSM (GLUCOSAMINE CHONDROITIN JOINT PO) Take 1 tablet by mouth 2 (two) times daily. 12/23/2017: Reports not taking currently  . HYDROcodone-acetaminophen (NORCO) 10-325 MG tablet Take 1 tablet by mouth every 4 (four) hours as needed (breakthrough pain). (Patient not taking: Reported on 12/23/2017) 12/23/2017: Reports not having new prescription  . methocarbamol (ROBAXIN) 500 MG tablet Take 1 tablet (500 mg total) by mouth every 6 (six) hours as needed for muscle spasms. (Patient not taking: Reported on 12/23/2017) 12/23/2017: Reports not taking currently  . omeprazole (PRILOSEC) 10 MG capsule     No facility-administered encounter medications on file as of 12/23/2017.     Functional Status:  In your present state of health, do you have any difficulty performing the following activities: 12/23/2017  Hearing? N  Vision? N  Difficulty concentrating or making decisions? N  Walking  or climbing stairs? Y  Comment get short of breath  Dressing or bathing? N  Doing errands, shopping? N  Preparing Food and eating ? N  Using the Toilet? N  In the past six months, have you accidently leaked urine? Y  Do you have problems with loss of bowel control? N  Managing your Medications? N  Managing your Finances? Y  Comment daughter manages bills  Housekeeping or managing your Housekeeping? N  Some recent data might be hidden    Fall/Depression Screening: Fall Risk  12/23/2017  Falls in the past year? No   PHQ 2/9 Scores 12/23/2017 12/11/2017  PHQ - 2 Score 0 0    THN CM Care Plan Problem One     Most Recent Value  Care Plan Problem One  Knowledge deficiet related to COPD  Role Documenting the Problem One  [place]aurice for Problem One  Active  THN Long Term Goal   Patient will report increasing number of days she walks outside to 4 days a week in the next 90 days.  THN Long Term Goal Start Date  12/23/17  Interventions for Problem One Long Term Goal  Care plan and goals reviewed and discussed with patient, encouraged to keep scheduled medical appointments, reviewed medications and encouraged compliance, fall precautions  reviewed and discussed, encouraged to utilize cane with aol ambulation, encouraged to increase activity as tolerated, discussed with patient utilizing gym for exercise  THN CM Short Term Goal #1   Patient will review COPD educational material in the next 30 days.  THN CM Short Term Goal #1 Start Date  12/23/17  Interventions for Short Term Goal #1  sending patient COPD education packet, encouraged patient to review educational material and to have family review material with her , reviewed signs and symptoms of COPD exacerbation, discussed COPD action plan  THN CM Short Term Goal #2   Patient will ask primary care provider about albuterol inhaler in the next 30 days.  THN CM Short Term Goal #2 Start Date  12/23/17  Interventions for Short Term Goal  #2  Reviewed medications and indications, discussed purpose of rescue inhaler versus maintanence inhaler, encouraged patient to discuss rescue inhaler with primary care provider at next appointment     Appointments:  Patient reports her next appointment with Dr. Rica Records, her primary care provider is tomorrow, 12/24/2017.  Last seen her Cardiologist, Dr. Geraldo Pitter on 08/06/2017 and should follow up with him in October 2019.  Encouraged patient to schedule Cardiology appointment.  Advanced Directives:   Denies having an Advance Directive in place.  Requesting information to create one.   Consent:  [facility] services reviewed and discussed with patient.  Patient verbally agrees to monthly telephone outreaches.  Plan: RN Health Coach will send primary MD barriers letter. RN Health Coach will route initial telephone assessment note to primary MD. [place] will send patient [place]. RN Health Coach will send patient Emergency planning/management officer. RN Health Coach will send patient 2019 Calendar Booklet. RN Health Coach will send patient COPD Educational Packet. RN Health Coach will make next monthly outreach to patient in the month of September   [place] [phone] [name]

## 2017-12-24 ENCOUNTER — Encounter: Payer: Self-pay | Admitting: Gastroenterology

## 2017-12-24 DIAGNOSIS — E785 Hyperlipidemia, unspecified: Secondary | ICD-10-CM | POA: Diagnosis not present

## 2017-12-24 DIAGNOSIS — E559 Vitamin D deficiency, unspecified: Secondary | ICD-10-CM | POA: Diagnosis not present

## 2017-12-24 DIAGNOSIS — I10 Essential (primary) hypertension: Secondary | ICD-10-CM | POA: Diagnosis not present

## 2017-12-24 DIAGNOSIS — Z79899 Other long term (current) drug therapy: Secondary | ICD-10-CM | POA: Diagnosis not present

## 2017-12-24 DIAGNOSIS — D649 Anemia, unspecified: Secondary | ICD-10-CM | POA: Diagnosis not present

## 2017-12-24 DIAGNOSIS — E039 Hypothyroidism, unspecified: Secondary | ICD-10-CM | POA: Diagnosis not present

## 2017-12-24 DIAGNOSIS — J309 Allergic rhinitis, unspecified: Secondary | ICD-10-CM | POA: Diagnosis not present

## 2017-12-24 DIAGNOSIS — E1140 Type 2 diabetes mellitus with diabetic neuropathy, unspecified: Secondary | ICD-10-CM | POA: Diagnosis not present

## 2018-01-06 ENCOUNTER — Other Ambulatory Visit: Payer: Self-pay

## 2018-01-06 NOTE — Patient Outreach (Signed)
[place] [facility]) Care Management  01/06/2018  [name] [name] 12-23-1950 [id number]  Successful outreach to the patient on today's date, HIPAA identifiers confirmed. BSW introduced self to the patient and the reason for today's call, indicating the patient had been referred for assistance with transportation. The patient confirms she has [facility]) plan 1. BSW educated the patient on [facility] transportation benefit Barrister's clerk. BSW to mail the patient a Musician as well as a step-by-step guide on how to utilize this benefit. BSW began educating the patient on [employer] provided via [facility]. The patient confirms she is already active with this service.  The patient minimalized food insecurities identified while completing the SDOH screen. The patient declined BSW sending resources stating "my daughter is helping me". BSW briefly educated the patient on the meals on wheels program as well as congregate meal sites throughout the county. BSW explained to the patient that the meals on wheels program is not currently operating on a wait list. BSW attempted to refer the patient to this program but the patient declined stating "I'm not home that much". BSW offered to mail the patient information about food pantries as well as congregate meal programs. The patient declined.  Plan: BSW will mail information for [facility] transportation benefit. BSW will contact the patient in the next three weeks to confirm receipt of mailing.  Daneen Schick, BSW, CDP [facility] [phone]

## 2018-01-08 DIAGNOSIS — D509 Iron deficiency anemia, unspecified: Secondary | ICD-10-CM | POA: Diagnosis not present

## 2018-01-09 DIAGNOSIS — D509 Iron deficiency anemia, unspecified: Secondary | ICD-10-CM | POA: Diagnosis not present

## 2018-01-16 DIAGNOSIS — D509 Iron deficiency anemia, unspecified: Secondary | ICD-10-CM | POA: Diagnosis not present

## 2018-01-19 ENCOUNTER — Encounter: Payer: Self-pay | Admitting: Gastroenterology

## 2018-01-20 DIAGNOSIS — E876 Hypokalemia: Secondary | ICD-10-CM | POA: Diagnosis not present

## 2018-01-20 DIAGNOSIS — D649 Anemia, unspecified: Secondary | ICD-10-CM | POA: Diagnosis not present

## 2018-01-20 DIAGNOSIS — J309 Allergic rhinitis, unspecified: Secondary | ICD-10-CM | POA: Diagnosis not present

## 2018-01-23 ENCOUNTER — Encounter: Payer: Self-pay | Admitting: *Deleted

## 2018-01-23 ENCOUNTER — Other Ambulatory Visit: Payer: Self-pay | Admitting: *Deleted

## 2018-01-23 NOTE — Patient Outreach (Signed)
[place] [facility]) Care Management  01/23/2018  [name] [name] [birth year] [id number]   RN Health Coach Monthly Outreach  Referral Date:12/11/2017 Referral Source:EMMI Prevent Screening Reason for Referral:Disease Management Education Insurance:[employer]   Outreach Attempt:  Successful telephone outreach to patient for monthly follow up.  HIPAA verified with patient.  Patient reporting she is currently suffering from sinuses/allergies.  Has received an allergy shot this week at the doctor's office and is taking a decongestant.  Still does not have a rescue inhaler.  Denies any increase in shortness of breath.  Patient also stating her iron level is low and she has received iron injections for the last 2 weeks at the [facility].  Appointments:  Reports seeing her primary care provider, Dr. Rica Records on 01/20/2018 and has follow up appointment scheduled for 01/28/2018.  Plan: RN Health Coach will make next telephone outreach to patient in the month of November.  [place] [phone] [email or web address]

## 2018-01-27 ENCOUNTER — Other Ambulatory Visit: Payer: Self-pay

## 2018-01-27 NOTE — Patient Outreach (Signed)
[place] [facility]) Care Management  01/27/2018  EZMAE SPEERS 1951/04/08 [id number]  Successful outreach to the patient on today's date, HIPAA identifiers confirmed. The patient is able to confirm receipt of resources. BSW discussed the process of utilizing [facility] for physician appointments. The patient stated understanding.   BSW to perform a discipline closure as no other social work needs have been identified at this time. The patient is in agreement with this plan and is aware she will remain active with [facility].  Daneen Schick, BSW, CDP [facility] [phone]

## 2018-01-30 ENCOUNTER — Ambulatory Visit: Payer: Self-pay | Admitting: Gastroenterology

## 2018-02-02 DIAGNOSIS — I129 Hypertensive chronic kidney disease with stage 1 through stage 4 chronic kidney disease, or unspecified chronic kidney disease: Secondary | ICD-10-CM | POA: Diagnosis not present

## 2018-02-02 DIAGNOSIS — Z23 Encounter for immunization: Secondary | ICD-10-CM | POA: Diagnosis not present

## 2018-02-02 DIAGNOSIS — N183 Chronic kidney disease, stage 3 (moderate): Secondary | ICD-10-CM | POA: Diagnosis not present

## 2018-02-02 DIAGNOSIS — N189 Chronic kidney disease, unspecified: Secondary | ICD-10-CM | POA: Diagnosis not present

## 2018-02-02 DIAGNOSIS — E876 Hypokalemia: Secondary | ICD-10-CM | POA: Diagnosis not present

## 2018-02-02 DIAGNOSIS — N179 Acute kidney failure, unspecified: Secondary | ICD-10-CM | POA: Diagnosis not present

## 2018-02-02 DIAGNOSIS — D519 Vitamin B12 deficiency anemia, unspecified: Secondary | ICD-10-CM | POA: Diagnosis not present

## 2018-02-02 DIAGNOSIS — R809 Proteinuria, unspecified: Secondary | ICD-10-CM | POA: Diagnosis not present

## 2018-02-02 DIAGNOSIS — E1122 Type 2 diabetes mellitus with diabetic chronic kidney disease: Secondary | ICD-10-CM | POA: Diagnosis not present

## 2018-02-04 ENCOUNTER — Other Ambulatory Visit: Payer: Self-pay | Admitting: Nephrology

## 2018-02-04 DIAGNOSIS — Z0000 Encounter for general adult medical examination without abnormal findings: Secondary | ICD-10-CM | POA: Diagnosis not present

## 2018-02-04 DIAGNOSIS — N189 Chronic kidney disease, unspecified: Secondary | ICD-10-CM

## 2018-02-04 DIAGNOSIS — E785 Hyperlipidemia, unspecified: Secondary | ICD-10-CM | POA: Diagnosis not present

## 2018-02-04 DIAGNOSIS — N959 Unspecified menopausal and perimenopausal disorder: Secondary | ICD-10-CM | POA: Diagnosis not present

## 2018-02-04 DIAGNOSIS — N183 Chronic kidney disease, stage 3 unspecified: Secondary | ICD-10-CM

## 2018-02-04 DIAGNOSIS — Z1239 Encounter for other screening for malignant neoplasm of breast: Secondary | ICD-10-CM | POA: Diagnosis not present

## 2018-02-04 DIAGNOSIS — Z9181 History of falling: Secondary | ICD-10-CM | POA: Diagnosis not present

## 2018-02-04 DIAGNOSIS — N179 Acute kidney failure, unspecified: Secondary | ICD-10-CM

## 2018-02-07 ENCOUNTER — Other Ambulatory Visit: Payer: Self-pay | Admitting: Cardiology

## 2018-02-07 DIAGNOSIS — E876 Hypokalemia: Secondary | ICD-10-CM

## 2018-02-07 DIAGNOSIS — Z7901 Long term (current) use of anticoagulants: Secondary | ICD-10-CM

## 2018-02-07 DIAGNOSIS — I10 Essential (primary) hypertension: Secondary | ICD-10-CM

## 2018-02-07 DIAGNOSIS — I480 Paroxysmal atrial fibrillation: Secondary | ICD-10-CM

## 2018-02-13 DIAGNOSIS — N183 Chronic kidney disease, stage 3 (moderate): Secondary | ICD-10-CM | POA: Diagnosis not present

## 2018-02-20 ENCOUNTER — Other Ambulatory Visit: Payer: Self-pay

## 2018-02-20 NOTE — Patient Outreach (Signed)
[facility] [facility]) Care Management  02/20/2018  [name] [name] 1950/11/04 [id number]   Medication Adherence call to [name] [name] spoke with patient she was not sure if she is to be taking Losartan / HCTZ 100/25 mg she ask if we can call doctor's office and clarify . Doctor Uppin nurse said she has been take off this medication and did not replace it with another medication. [name] [name] is showing past due under [place].   [place] Management Direct Dial [phone]  Fax [phone] [email or web address]

## 2018-02-27 ENCOUNTER — Encounter: Payer: Self-pay | Admitting: Gastroenterology

## 2018-03-02 ENCOUNTER — Encounter: Payer: Self-pay | Admitting: Gastroenterology

## 2018-03-02 ENCOUNTER — Telehealth: Payer: Self-pay

## 2018-03-02 ENCOUNTER — Ambulatory Visit (INDEPENDENT_AMBULATORY_CARE_PROVIDER_SITE_OTHER): Payer: Medicare Other | Admitting: Gastroenterology

## 2018-03-02 VITALS — BP 132/78 | HR 68 | Ht 64.0 in | Wt 258.0 lb

## 2018-03-02 DIAGNOSIS — Z8371 Family history of colonic polyps: Secondary | ICD-10-CM

## 2018-03-02 DIAGNOSIS — Z86010 Personal history of colonic polyps: Secondary | ICD-10-CM | POA: Diagnosis not present

## 2018-03-02 MED ORDER — SUPREP BOWEL PREP KIT 17.5-3.13-1.6 GM/177ML PO SOLN: 1.0000 | ORAL | 0 refills | Status: DC

## 2018-03-02 NOTE — Patient Instructions (Addendum)
If you are age 68 or older, your body mass index should be between 23-30. Your Body mass index is 44.29 kg/m. If this is out of the aforementioned range listed, please consider follow up with your Primary Care Provider.  If you are age 69 or younger, your body mass index should be between 19-25. Your Body mass index is 44.29 kg/m. If this is out of the aformentioned range listed, please consider follow up with your Primary Care Provider.   You have been scheduled for a colonoscopy. Please follow written instructions given to you at your visit today.  Please pick up your prep supplies at the pharmacy within the next 1-3 days. If you use inhalers (even only as needed), please bring them with you on the day of your procedure. Your physician has requested that you go to www.startemmi.com and enter the access code given to you at your visit today. This web site gives a general overview about your procedure. However, you should still follow specific instructions given to you by our office regarding your preparation for the procedure.  We have sent the following medications to your pharmacy for you to pick up at your convenience: Suprep  Two days before your procedure: Mix 3 packs (or capfuls) of Miralax in 48 ounces of clear liquid and drink at 6pm.   You will be contacted by our office prior to your procedure for directions on holding your Xarelto.  If you do not hear from our office 1 week prior to your scheduled procedure, please call [phone] to discuss.  Thank you,  Dr. Jackquline Denmark

## 2018-03-02 NOTE — Progress Notes (Signed)
Chief Complaint: For colonoscopy  Referring Provider:  Nicholos Johns, MD      ASSESSMENT AND PLAN;   #1. H/O Polyps (TA, 12/2014 but limited preparation)  #2. FH of polyps.  #3. A Fib on Xeralto (followed by Dr. Geraldo Pitter)  Plan: - Proceed with colonoscopy with 2-day prep.  I have discussed the risks and benefits.  The risks including risk of perforation requiring laparotomy, bleeding after polypectomy requiring blood transfusions and risks of anesthesia/sedation were discussed.  Rare risks of missing colorectal neoplasms were also discussed.  Alternatives were given.  Patient is fully aware and agrees to proceed. All the questions were answered. Colonoscopy will be scheduled in upcoming days.  Patient is to report immediately if there is any significant weight loss or excessive bleeding until then. Consent forms were given for review. - Hold Xeralto 24hr before if okay with Dr. Geraldo Pitter.  Would also get cardiology clearance.    HPI:    [name] [name] is a 67 y.o. female  For follow-up visit. No nausea, vomiting, heartburn, regurgitation, odynophagia or dysphagia.  No significant diarrhea or constipation.  There is no melena or hematochezia. No unintentional weight loss. Had colonoscopy 01/19/2015-6 mm sessile polyp in the proximal ascending colon status post polypectomy.  Biopsies came back as tubular adenoma, mild sigmoid diverticulosis.  Limited preparation.  Only 75 to 80% of the colonic mucosa was visualized satisfactorily (adult). Brother had polyps at a young age. Here for repeat colonoscopy.  Past Medical History:  Diagnosis Date  . Arthritis   . Carpal tunnel syndrome   . Depression   . Esophagitis   . GERD (gastroesophageal reflux disease)   . Graves disease   . History of DVT (deep vein thrombosis)   . Hypertension   . Hypothyroidism   . MI (myocardial infarction) ([place])   . Mood disorder ([place])   . Peptic ulcer disease   . Pre-diabetes   . Stroke ([place])   .  Thyroid disease   . Venous insufficiency   . Vitamin D deficiency     Past Surgical History:  Procedure Laterality Date  . COLONOSCOPY  01/19/2015   Colonic polyp status post polypectomy. Limited examination of the right side of the colon. Mild sigmoid diverticulosis. Tubular adenoma.   Marland Kitchen LEFT HEART CATH AND CORONARY ANGIOGRAPHY N/A 10/25/2016   Procedure: Left Heart Cath and Coronary Angiography;  Surgeon: Belva Crome, MD;  Location: [facility];  Service: Cardiovascular;  Laterality: N/A;  . LUMBAR LAMINECTOMY/DECOMPRESSION MICRODISCECTOMY N/A 10/23/2016   Procedure: LUMBAR LAMINECTOMY/DECOMPRESSION MICRODISCECTOMY;  Surgeon: Latanya Maudlin, MD;  Location: [facility];  Service: Orthopedics;  Laterality: N/A;  . TUBAL LIGATION    . ULTRASOUND GUIDANCE FOR VASCULAR ACCESS  10/25/2016   Procedure: Ultrasound Guidance For Vascular Access;  Surgeon: Belva Crome, MD;  Location: [facility];  Service: Cardiovascular;;    Family History  Problem Relation Age of Onset  . Hypertension Mother   . Hypertension Father     Social History   Tobacco Use  . Smoking status: Never Smoker  . Smokeless tobacco: Never Used  Substance Use Topics  . Alcohol use: No  . Drug use: No    Current Outpatient Medications  Medication Sig Dispense Refill  . albuterol (PROVENTIL HFA;VENTOLIN HFA) 108 (90 BASE) MCG/ACT inhaler Inhale 2 puffs into the lungs every 6 (six) hours as needed for wheezing or shortness of breath. 1 Inhaler 2  . aspirin EC 81 MG tablet Take  81 mg by mouth daily.    . budesonide-formoterol (SYMBICORT) 160-4.5 MCG/ACT inhaler Inhale 2 puffs into the lungs 2 (two) times daily.    . cholecalciferol (VITAMIN D) 1000 UNITS tablet Take 2,000 Units by mouth daily.    Marland Kitchen diltiazem (CARDIZEM CD) 120 MG 24 hr capsule Take 1 capsule (120 mg total) by mouth daily. 90 capsule 3  . docusate sodium (COLACE) 100 MG capsule Take 200 mg by mouth 2 (two) times daily as needed for mild  constipation.    . ferrous sulfate 325 (65 FE) MG EC tablet Take 325 mg by mouth 3 (three) times daily with meals.    . fluticasone (FLONASE) 50 MCG/ACT nasal spray Place 2 sprays into both nostrils daily as needed for allergies or rhinitis.    Marland Kitchen gabapentin (NEURONTIN) 300 MG capsule Take 300 mg by mouth 3 (three) times daily.    Marland Kitchen levothyroxine (SYNTHROID, LEVOTHROID) 112 MCG tablet Take 112 mcg by mouth daily before breakfast.    . loratadine (CLARITIN) 10 MG tablet Take 10 mg by mouth daily as needed for allergies.    Marland Kitchen losartan-hydrochlorothiazide (HYZAAR) 100-25 MG per tablet Take 1 tablet by mouth daily. 30 tablet 0  . montelukast (SINGULAIR) 10 MG tablet Take 10 mg by mouth at bedtime.    Marland Kitchen omega-3 acid ethyl esters (LOVAZA) 1 G capsule Take 4 g by mouth daily.    Marland Kitchen omeprazole (PRILOSEC) 20 MG capsule Take 20 mg by mouth daily.    . potassium chloride SA (K-DUR,KLOR-CON) 20 MEQ tablet Take 100-120 mEq by mouth See admin instructions. Pt to take 6 tablets in the morning, and 5 tablets in the evening    . tiotropium (SPIRIVA) 18 MCG inhalation capsule Place 18 mcg into inhaler and inhale daily.    Marland Kitchen venlafaxine XR (EFFEXOR-XR) 75 MG 24 hr capsule Take 75 mg by mouth daily with breakfast.    . XARELTO 20 MG TABS tablet TAKE 1 TABLET BY MOUTH EVERY DAY 30 tablet 0  . spironolactone (ALDACTONE) 25 MG tablet Take 25 mg by mouth daily.     No current facility-administered medications for this visit.     Allergies  Allergen Reactions  . Sulfonamide Derivatives Itching    Review of Systems:  Constitutional: Denies fever, chills, diaphoresis, appetite change and fatigue.  HEENT: Denies photophobia, eye pain, redness, hearing loss, ear pain, congestion, sore throat, rhinorrhea, sneezing, mouth sores, neck pain, neck stiffness and tinnitus.   Respiratory: Denies SOB, DOE, cough, chest tightness,  and wheezing.   Cardiovascular: Denies chest pain, palpitations and leg swelling.  Genitourinary:  Denies dysuria, urgency, frequency, hematuria, flank pain and difficulty urinating.  Musculoskeletal: Has arthritis Skin: No rash.  Neurological: Denies dizziness, seizures, syncope, weakness, light-headedness, numbness and headaches.  Hematological: Denies adenopathy. Easy bruising, personal or family bleeding history  Psychiatric/Behavioral: No anxiety or depression     Physical Exam:    BP 132/78   Pulse 68   Ht 5\' 4"  (1.626 m)   Wt 258 lb (117 kg)   BMI 44.29 kg/m  Filed Weights   03/02/18 0950  Weight: 258 lb (117 kg)   Constitutional:  Well-developed, in no acute distress. Psychiatric: Normal mood and affect. Behavior is normal. HEENT: Pupils normal.  Conjunctivae are normal. No scleral icterus. Neck supple.  Cardiovascular: Normal rate, regular rhythm. No edema Pulmonary/chest: Effort normal and breath sounds normal. No wheezing, rales or rhonchi. Abdominal: Soft, nondistended. Nontender. Bowel sounds active throughout. There are no masses palpable.  No hepatomegaly. Rectal:  defered Neurological: Alert and oriented to person place and time. Skin: Skin is warm and dry. No rashes noted. 30 minutes spent with the patient today. Greater than 50% was spent in counseling and coordination of care with the patient   Carmell Austria, MD 03/02/2018, 10:14 AM  Cc: Nicholos Johns, MD

## 2018-03-02 NOTE — Telephone Encounter (Signed)
[facility] Pre-operative Risk Assessment     Request for surgical clearance:     Endoscopy Procedure  What type of surgery is being performed?     Colonoscopy  When is this surgery scheduled?     04/17/18  What type of clearance is required ?   Pharmacy  Are there any medications that need to be held prior to surgery and how long? Xarelto 24 hours   Practice name and name of physician performing surgery?      [facility]  What is your office phone and fax number?      Phone- [phone]  [phone]  Anesthesia type (None, local, MAC, general) ?       MAC

## 2018-03-04 ENCOUNTER — Other Ambulatory Visit: Payer: Self-pay | Admitting: Cardiology

## 2018-03-04 DIAGNOSIS — E876 Hypokalemia: Secondary | ICD-10-CM

## 2018-03-04 DIAGNOSIS — I480 Paroxysmal atrial fibrillation: Secondary | ICD-10-CM

## 2018-03-04 DIAGNOSIS — Z7901 Long term (current) use of anticoagulants: Secondary | ICD-10-CM

## 2018-03-04 DIAGNOSIS — I10 Essential (primary) hypertension: Secondary | ICD-10-CM

## 2018-03-04 NOTE — Telephone Encounter (Signed)
Called patient and instructed her to hold her Xarelto 24 hours before her procedure. Patient voiced understanding.

## 2018-03-04 NOTE — Telephone Encounter (Signed)
Patient with diagnosis of Afib on Xarelto for anticoagulation.    Procedure: colonoscopy Date of procedure: 04/17/18  CHADS2-VASc score of  6 (CHF, HTN, AGE, DM2, stroke/tia x 2, CAD, AGE, female)  CrCl 167ml/min  Per office protocol, patient can hold Xarelto for 24 hours prior to procedure.  Given history of stroke would recommend resuming Xarelto as soon as safe post -procedure.

## 2018-03-04 NOTE — Telephone Encounter (Signed)
   Primary Cardiologist: No primary care provider on file.  Chart reviewed as part of pre-operative protocol coverage. RCRI 0.9%. Spoke with patient who affirms she is feeling well. Though formal cardiac clearance not specifically requested, left in APP box for review. Given past medical history and time since last visit, based on ACC/AHA guidelines, [name] [name] would be at acceptable risk for the planned procedure without further cardiovascular testing.   I will route this recommendation to the requesting party via Epic and remove from pre-op pool.  Please call with questions.  Charlie Pitter, PA-C 03/04/2018, 4:20 PM

## 2018-03-09 ENCOUNTER — Other Ambulatory Visit: Payer: Self-pay | Admitting: *Deleted

## 2018-03-09 ENCOUNTER — Encounter: Payer: Self-pay | Admitting: *Deleted

## 2018-03-09 NOTE — Patient Outreach (Addendum)
[place] [facility]) Care Management  [place]  03/09/2018   [name] [name] 10-Nov-1950 [id number]   RN Health Coach Monthly Outreach   Referral Date:  12/11/2017 Referral Source:  EMMI Prevent Screening Reason for Referral:  Disease Management Education Insurance:  [employer]    Outreach Attempt:  Successful telephone outreach to patient for quarterly update.  HIPAA verified with patient.  Patient stating she is doing ok.  Reports being recently told she has a cyst on her ovaries and need to follow up with physician for recommendations.  Denies any shortness of breath.  Continues to not have a prescription for a rescue inhaler.  Encouraged patient to discuss rescue inhaler with primary care provider.  Denies any recent sick days.  Encounter Medications:  Outpatient Encounter Medications as of 03/09/2018  Medication Sig Note  . aspirin EC 81 MG tablet Take 81 mg by mouth daily.   . cholecalciferol (VITAMIN D) 1000 UNITS tablet Take 2,000 Units by mouth daily. 12/23/2017: Reports taking 50, 000 units weekly and 2,000 units every other day  . diltiazem (CARDIZEM CD) 120 MG 24 hr capsule TAKE 1 CAPSULE BY MOUTH DAILY.   Marland Kitchen docusate sodium (COLACE) 100 MG capsule Take 200 mg by mouth 2 (two) times daily as needed for mild constipation.   . ferrous sulfate 325 (65 FE) MG EC tablet Take 325 mg by mouth 3 (three) times daily with meals.   . fluticasone (FLONASE) 50 MCG/ACT nasal spray Place 2 sprays into both nostrils daily as needed for allergies or rhinitis.   Marland Kitchen gabapentin (NEURONTIN) 300 MG capsule Take 300 mg by mouth 3 (three) times daily.   Marland Kitchen levothyroxine (SYNTHROID, LEVOTHROID) 112 MCG tablet Take 112 mcg by mouth daily before breakfast.   . loratadine (CLARITIN) 10 MG tablet Take 10 mg by mouth daily as needed for allergies.   . montelukast (SINGULAIR) 10 MG tablet Take 10 mg by mouth at bedtime.   Marland Kitchen omega-3 acid ethyl esters (LOVAZA) 1 G capsule  Take 4 g by mouth daily.   Marland Kitchen omeprazole (PRILOSEC) 20 MG capsule Take 20 mg by mouth daily.   . potassium chloride SA (K-DUR,KLOR-CON) 20 MEQ tablet Take 100-120 mEq by mouth See admin instructions. Pt to take 6 tablets in the morning, and 5 tablets in the evening 12/23/2017: Reports taking 10 meq twice a day  . spironolactone (ALDACTONE) 25 MG tablet Take 25 mg by mouth daily.   Marland Kitchen tiotropium (SPIRIVA) 18 MCG inhalation capsule Place 18 mcg into inhaler and inhale daily.   Marland Kitchen venlafaxine XR (EFFEXOR-XR) 75 MG 24 hr capsule Take 75 mg by mouth daily with breakfast.   . XARELTO 20 MG TABS tablet TAKE 1 TABLET BY MOUTH EVERY DAY   . albuterol (PROVENTIL HFA;VENTOLIN HFA) 108 (90 BASE) MCG/ACT inhaler Inhale 2 puffs into the lungs every 6 (six) hours as needed for wheezing or shortness of breath. (Patient not taking: Reported on 03/09/2018) 12/24/2017: Reports she cannot find this inhaler  . budesonide-formoterol (SYMBICORT) 160-4.5 MCG/ACT inhaler Inhale 2 puffs into the lungs 2 (two) times daily. 12/23/2017: Reports not taking currently  . losartan-hydrochlorothiazide (HYZAAR) 100-25 MG per tablet Take 1 tablet by mouth daily. (Patient not taking: Reported on 03/09/2018) 03/09/2018: Reports not taking  . SUPREP BOWEL PREP KIT 17.5-3.13-1.6 GM/177ML SOLN Take 1 kit by mouth as directed.    No facility-administered encounter medications on file as of 03/09/2018.     Functional Status:  In your present state  of health, do you have any difficulty performing the following activities: 12/23/2017  Hearing? N  Vision? N  Difficulty concentrating or making decisions? N  Walking or climbing stairs? Y  Comment get short of breath  Dressing or bathing? N  Doing errands, shopping? N  Preparing Food and eating ? N  Using the Toilet? N  In the past six months, have you accidently leaked urine? Y  Do you have problems with loss of bowel control? N  Managing your Medications? N  Managing your Finances? Y   Comment daughter manages bills  Housekeeping or managing your Housekeeping? N  Some recent data might be hidden    Fall/Depression Screening: Fall Risk  03/09/2018 12/23/2017  Falls in the past year? 0 No  Follow up Education provided;Falls prevention discussed -   PHQ 2/9 Scores 12/23/2017 12/11/2017  PHQ - 2 Score 0 0    THN CM Care Plan Problem One     Most Recent Value  Care Plan Problem One  Knowledge deficiet related to COPD  Role Documenting the Problem One  [place] for Problem One  Active  THN Long Term Goal   Patient will report no hospitalizations or emergency room visits in the next 90 days  THN Long Term Goal Start Date  03/09/18  [facility] Long Term Goal Met Date  03/09/18  Interventions for Problem One Long Term Goal  Reviewed and discussed current care plan and goals with patient, encouraged to keep and attend scheduled appointments, encouraged patient to contact primary care provider to confirm next appointment, reviewed medications and indications and encouraged compliance, congratulated patient on increase her activity level and encouraged to continue to do so  THN CM Short Term Goal #1   Patient will verbalize the meaning of the COPD Action Plan Zones in the next 90 days  THN CM Short Term Goal #1 Start Date  03/09/18  [facility] CM Short Term Goal #1 Met Date  03/09/18  Interventions for Short Term Goal #1  Confimred patient has COPD educational material, reviewed and discussed COPD Action Plan and zones with patient, reviewed signs and symptoms in each zone, encouraged patient to continue to review COPD education  [facility] CM Short Term Goal #2   Patient will ask primary care provider about albuterol inhaler in the next 30 days.  THN CM Short Term Goal #2 Start Date  03/09/18  Interventions for Short Term Goal #2  Confirmed patient still does not have a rescue inhaler, discussed purpose and importance of rescue inhaler, encouraged patient to discuss rescue inhaler  prescription with physician      Appointments:  States her last appointment with Dr. Rica Records was 01/20/2018.  Thinks she has scheduled appointment this month but is unsure of the date.  Encouraged patient to contact Dr. Lauralee Evener office to confirm appointment.  Plan: RN Health Coach will send primary care provider Quarterly Update. RN Health Coach will make next telephone outreach to patient in the month of February.  [place] [phone] [name]

## 2018-03-12 DIAGNOSIS — M545 Low back pain: Secondary | ICD-10-CM | POA: Diagnosis not present

## 2018-03-12 DIAGNOSIS — R935 Abnormal findings on diagnostic imaging of other abdominal regions, including retroperitoneum: Secondary | ICD-10-CM | POA: Diagnosis not present

## 2018-03-15 ENCOUNTER — Other Ambulatory Visit: Payer: Self-pay | Admitting: Cardiology

## 2018-03-15 DIAGNOSIS — I480 Paroxysmal atrial fibrillation: Secondary | ICD-10-CM

## 2018-03-15 DIAGNOSIS — E876 Hypokalemia: Secondary | ICD-10-CM

## 2018-03-15 DIAGNOSIS — I10 Essential (primary) hypertension: Secondary | ICD-10-CM

## 2018-03-15 DIAGNOSIS — Z7901 Long term (current) use of anticoagulants: Secondary | ICD-10-CM

## 2018-03-19 DIAGNOSIS — R9389 Abnormal findings on diagnostic imaging of other specified body structures: Secondary | ICD-10-CM | POA: Diagnosis not present

## 2018-03-25 DIAGNOSIS — I10 Essential (primary) hypertension: Secondary | ICD-10-CM | POA: Diagnosis not present

## 2018-03-25 DIAGNOSIS — Z79899 Other long term (current) drug therapy: Secondary | ICD-10-CM | POA: Diagnosis not present

## 2018-03-25 DIAGNOSIS — E876 Hypokalemia: Secondary | ICD-10-CM | POA: Diagnosis not present

## 2018-03-25 DIAGNOSIS — E1140 Type 2 diabetes mellitus with diabetic neuropathy, unspecified: Secondary | ICD-10-CM | POA: Diagnosis not present

## 2018-04-03 ENCOUNTER — Encounter: Payer: Self-pay | Admitting: Gastroenterology

## 2018-04-13 DIAGNOSIS — Z1231 Encounter for screening mammogram for malignant neoplasm of breast: Secondary | ICD-10-CM | POA: Diagnosis not present

## 2018-04-13 DIAGNOSIS — Z13820 Encounter for screening for osteoporosis: Secondary | ICD-10-CM | POA: Diagnosis not present

## 2018-04-13 DIAGNOSIS — R0602 Shortness of breath: Secondary | ICD-10-CM | POA: Diagnosis not present

## 2018-04-15 DIAGNOSIS — D509 Iron deficiency anemia, unspecified: Secondary | ICD-10-CM | POA: Diagnosis not present

## 2018-04-17 ENCOUNTER — Ambulatory Visit (AMBULATORY_SURGERY_CENTER): Payer: Medicare Other | Admitting: Gastroenterology

## 2018-04-17 ENCOUNTER — Encounter: Payer: Self-pay | Admitting: Gastroenterology

## 2018-04-17 VITALS — BP 115/72 | HR 68 | Temp 97.5°F | Resp 19 | Ht 64.0 in | Wt 258.0 lb

## 2018-04-17 DIAGNOSIS — Z86010 Personal history of colonic polyps: Secondary | ICD-10-CM | POA: Diagnosis not present

## 2018-04-17 DIAGNOSIS — D123 Benign neoplasm of transverse colon: Secondary | ICD-10-CM

## 2018-04-17 DIAGNOSIS — D125 Benign neoplasm of sigmoid colon: Secondary | ICD-10-CM | POA: Diagnosis not present

## 2018-04-17 MED ORDER — SODIUM CHLORIDE 0.9 % IV SOLN: 500.0000 mL | Freq: Once | INTRAVENOUS | Status: AC

## 2018-04-17 NOTE — Progress Notes (Signed)
Called to room to assist during endoscopic procedure.  Patient ID and intended procedure confirmed with present staff. Received instructions for my participation in the procedure from the performing physician.  

## 2018-04-17 NOTE — Progress Notes (Signed)
Report given to PACU, vss 

## 2018-04-17 NOTE — Patient Instructions (Signed)
Resume [name] [name] in three days on Monday December 23.  Card given for clip placed in colon.  YOU HAD AN ENDOSCOPIC PROCEDURE TODAY AT [facility]:   Refer to the procedure report that was given to you for any specific questions about what was found during the examination.  If the procedure report does not answer your questions, please call your gastroenterologist to clarify.  If you requested that your care partner not be given the details of your procedure findings, then the procedure report has been included in a sealed envelope for you to review at your convenience later.  YOU SHOULD EXPECT: Some feelings of bloating in the abdomen. Passage of more gas than usual.  Walking can help get rid of the air that was put into your GI tract during the procedure and reduce the bloating. If you had a lower endoscopy (such as a colonoscopy or flexible sigmoidoscopy) you may notice spotting of blood in your stool or on the toilet paper. If you underwent a bowel prep for your procedure, you may not have a normal bowel movement for a few days.  Please Note:  You might notice some irritation and congestion in your nose or some drainage.  This is from the oxygen used during your procedure.  There is no need for concern and it should clear up in a day or so.  SYMPTOMS TO REPORT IMMEDIATELY:   Following lower endoscopy (colonoscopy or flexible sigmoidoscopy):  Excessive amounts of blood in the stool  Significant tenderness or worsening of abdominal pains  Swelling of the abdomen that is new, acute  Fever of 100F or higher   For urgent or emergent issues, a gastroenterologist can be reached at any hour by calling [phone].   DIET:  We do recommend a small meal at first, but then you may proceed to your regular diet.  Drink plenty of fluids but you should avoid alcoholic beverages for 24 hours.  ACTIVITY:  You should plan to take it easy for the rest of today and you should NOT DRIVE or  use heavy machinery until tomorrow (because of the sedation medicines used during the test).    FOLLOW UP: Our staff will call the number listed on your records the next business day following your procedure to check on you and address any questions or concerns that you may have regarding the information given to you following your procedure. If we do not reach you, we will leave a message.  However, if you are feeling well and you are not experiencing any problems, there is no need to return our call.  We will assume that you have returned to your regular daily activities without incident.  If any biopsies were taken you will be contacted by phone or by letter within the next 1-3 weeks.  Please call us at [phone] if you have not heard about the biopsies in 3 weeks.    SIGNATURES/CONFIDENTIALITY: You and/or your care partner have signed paperwork which will be entered into your electronic medical record.  These signatures attest to the fact that that the information above on your After Visit Summary has been reviewed and is understood.  Full responsibility of the confidentiality of this discharge information lies with you and/or your care-partner.

## 2018-04-17 NOTE — Op Note (Addendum)
[place] Patient Name: [name] [name] Procedure Date: 04/17/2018 12:58 PM MRN: [id number] Endoscopist: Jackquline Denmark , MD Age: 67 Referring MD:  Date of Birth: 08-27-1950 Gender: Female Account #: [id number] Procedure:                Colonoscopy Indications:              Screening for colorectal malignant neoplasm Medicines:                Monitored Anesthesia Care Procedure:                Pre-Anesthesia Assessment:                           - Prior to the procedure, a History and Physical                            was performed, and patient medications and                            allergies were reviewed. The patient's tolerance of                            previous anesthesia was also reviewed. The risks                            and benefits of the procedure and the sedation                            options and risks were discussed with the patient.                            All questions were answered, and informed consent                            was obtained. Prior Anticoagulants: The patient has                            taken Xarelto (rivaroxaban), last dose was 2 days                            prior to procedure. ASA Grade Assessment: III - A                            patient with severe systemic disease. After                            reviewing the risks and benefits, the patient was                            deemed in satisfactory condition to undergo the                            procedure.  After obtaining informed consent, the colonoscope                            was passed under direct vision. Throughout the                            procedure, the patient's blood pressure, pulse, and                            oxygen saturations were monitored continuously. The                            Colonoscope was introduced through the anus and                            advanced to the the cecum, identified by                     appendiceal orifice and ileocecal valve. The                            colonoscopy was performed without difficulty. The                            patient tolerated the procedure well. The quality                            of the bowel preparation was good. Scope In: 1:03:49 PM Scope Out: 1:27:58 PM Scope Withdrawal Time: 0 hours 18 minutes 13 seconds  Total Procedure Duration: 0 hours 24 minutes 9 seconds  Findings:                 A 4 mm polyp was found in the proximal ascending                            colon. The polyp was sessile. The polyp was removed                            with a cold snare. Resection but could not be                            retrieved.                           A 20 mm polyp was found in the hepatic flexure. The                            polyp was semi-pedunculated with broad-base. The                            polyp was removed with a hot snare. Resection and                            retrieval with Jabier Mutton basket were complete. To  prevent bleeding after the polypectomy, two                            hemostatic clips were successfully placed (MR                            conditional) to close post polypectomy site. There                            was no bleeding at the end of the procedure.                           A 6 mm polyp was found in the hepatic flexure. The                            polyp was sessile. The polyp was removed with a                            cold snare. Resection and retrieval were complete.                            Estimated blood loss: none.                           A 6 mm polyp was found in the proximal sigmoid                            colon. The polyp was sessile. The polyp was removed                            with a cold snare. Resection and retrieval were                            complete. Estimated blood loss: none.                           A few small-mouthed  diverticula were found in the                            sigmoid colon.                           The exam was otherwise without abnormality on                            direct and retroflexion views. Complications:            No immediate complications. Estimated Blood Loss:     Estimated blood loss: none. Impression:               - Significant hepatic flexure polyp s/p polypectomy                            followed by endoscopic clipping of the post  polypectomy site.                           - Small colonic polyps status post polypectomy.                           - Mild sigmoid diverticulosis. Recommendation:           - Patient has a contact number available for                            emergencies. The signs and symptoms of potential                            delayed complications were discussed with the                            patient. Return to normal activities tomorrow.                            Written discharge instructions were provided to the                            patient.                           - Resume previous diet.                           - Resume Xarelto (rivaroxaban) at prior dose in 3                            days. Watch for post polypectomy bleeding.                            Discussed with the patient patient's family.                           - Repeat colonoscopy for surveillance based on                            pathology results.                           - Return to GI clinic PRN. Jackquline Denmark, MD 04/17/2018 1:40:01 PM This report has been signed electronically.

## 2018-04-17 NOTE — H&P (Signed)
Pt. Reports her son is being treated with medication for some type of cancer.   She is not sure what kind.

## 2018-04-20 ENCOUNTER — Telehealth: Payer: Self-pay

## 2018-04-20 NOTE — Telephone Encounter (Signed)
unable to leave message per patients request.

## 2018-04-20 NOTE — Telephone Encounter (Signed)
Patient calling back stating she is still a little sore but has not questions or concerns.

## 2018-04-28 DIAGNOSIS — D509 Iron deficiency anemia, unspecified: Secondary | ICD-10-CM | POA: Diagnosis not present

## 2018-05-05 ENCOUNTER — Encounter: Payer: Self-pay | Admitting: Gastroenterology

## 2018-05-05 DIAGNOSIS — D509 Iron deficiency anemia, unspecified: Secondary | ICD-10-CM | POA: Diagnosis not present

## 2018-05-07 DIAGNOSIS — D519 Vitamin B12 deficiency anemia, unspecified: Secondary | ICD-10-CM | POA: Diagnosis not present

## 2018-05-07 DIAGNOSIS — N179 Acute kidney failure, unspecified: Secondary | ICD-10-CM | POA: Diagnosis not present

## 2018-05-07 DIAGNOSIS — D631 Anemia in chronic kidney disease: Secondary | ICD-10-CM | POA: Diagnosis not present

## 2018-05-07 DIAGNOSIS — N183 Chronic kidney disease, stage 3 (moderate): Secondary | ICD-10-CM | POA: Diagnosis not present

## 2018-05-07 DIAGNOSIS — I129 Hypertensive chronic kidney disease with stage 1 through stage 4 chronic kidney disease, or unspecified chronic kidney disease: Secondary | ICD-10-CM | POA: Diagnosis not present

## 2018-05-07 DIAGNOSIS — E876 Hypokalemia: Secondary | ICD-10-CM | POA: Diagnosis not present

## 2018-05-07 DIAGNOSIS — N189 Chronic kidney disease, unspecified: Secondary | ICD-10-CM | POA: Diagnosis not present

## 2018-05-07 DIAGNOSIS — R809 Proteinuria, unspecified: Secondary | ICD-10-CM | POA: Diagnosis not present

## 2018-05-07 DIAGNOSIS — E1122 Type 2 diabetes mellitus with diabetic chronic kidney disease: Secondary | ICD-10-CM | POA: Diagnosis not present

## 2018-06-08 ENCOUNTER — Other Ambulatory Visit: Payer: Self-pay | Admitting: *Deleted

## 2018-06-08 ENCOUNTER — Encounter: Payer: Self-pay | Admitting: *Deleted

## 2018-06-08 NOTE — Patient Outreach (Signed)
[place] [facility]) Care Management  [facility] Care Manager  06/08/2018   NURIA PHEBUS 11-Jul-1950 [id number]   RN Health Coach Quarterly Outreach   Referral Date:  12/11/2017 Referral Source:  EMMI Prevent Screening Reason for Referral:  Disease Management Education Insurance:  [employer]   Outreach Attempt: Successful telephone outreach to patient for quarterly follow up.  HIPAA verified with patient.  Patient stating she is feeling like her sinuses are stopped up.  Has been taking Claritin, but verbalizes she will probably seek the assistance of her primary care provider.  Encouraged patient to contact primary care for guidance.  Denies any increase of shortness of breath.  Reports she still does not have rescue inhaler.  Encouraged patient to discuss rescue inhaler with primary care provider.  Encounter Medications:  Outpatient Encounter Medications as of 06/08/2018  Medication Sig Note  . aspirin EC 81 MG tablet Take 81 mg by mouth daily.   . cholecalciferol (VITAMIN D) 1000 UNITS tablet Take 2,000 Units by mouth daily. 12/23/2017: Reports taking 50, 000 units weekly and 2,000 units every other day  . diltiazem (CARDIZEM CD) 120 MG 24 hr capsule TAKE 1 CAPSULE BY MOUTH DAILY.   Marland Kitchen docusate sodium (COLACE) 100 MG capsule Take 200 mg by mouth 2 (two) times daily as needed for mild constipation.   . ferrous sulfate 325 (65 FE) MG EC tablet Take 325 mg by mouth 3 (three) times daily with meals.   . fluticasone (FLONASE) 50 MCG/ACT nasal spray Place 2 sprays into both nostrils daily as needed for allergies or rhinitis.   Marland Kitchen gabapentin (NEURONTIN) 300 MG capsule Take 300 mg by mouth 3 (three) times daily.   Marland Kitchen levothyroxine (SYNTHROID, LEVOTHROID) 112 MCG tablet Take 112 mcg by mouth daily before breakfast.   . loratadine (CLARITIN) 10 MG tablet Take 10 mg by mouth daily as needed for allergies.   . montelukast (SINGULAIR) 10 MG tablet Take 10 mg by mouth at bedtime.    Marland Kitchen omega-3 acid ethyl esters (LOVAZA) 1 G capsule Take 4 g by mouth daily.   Marland Kitchen omeprazole (PRILOSEC) 20 MG capsule Take 20 mg by mouth daily.   . potassium chloride SA (K-DUR,KLOR-CON) 20 MEQ tablet Take 100-120 mEq by mouth See admin instructions. Pt to take 6 tablets in the morning, and 5 tablets in the evening 12/23/2017: Reports taking 10 meq twice a day  . spironolactone (ALDACTONE) 25 MG tablet Take 25 mg by mouth daily.   Marland Kitchen tiotropium (SPIRIVA) 18 MCG inhalation capsule Place 18 mcg into inhaler and inhale daily.   Marland Kitchen venlafaxine XR (EFFEXOR-XR) 75 MG 24 hr capsule Take 75 mg by mouth daily with breakfast.   . XARELTO 20 MG TABS tablet TAKE 1 TABLET BY MOUTH EVERY DAY 04/17/2018: Last dose Wed. A.m.  04/15/2018  . albuterol (PROVENTIL HFA;VENTOLIN HFA) 108 (90 BASE) MCG/ACT inhaler Inhale 2 puffs into the lungs every 6 (six) hours as needed for wheezing or shortness of breath. (Patient not taking: Reported on 03/09/2018) 12/24/2017: Reports she cannot find this inhaler  . budesonide-formoterol (SYMBICORT) 160-4.5 MCG/ACT inhaler Inhale 2 puffs into the lungs 2 (two) times daily. 12/23/2017: Reports not taking currently  . losartan-hydrochlorothiazide (HYZAAR) 100-25 MG per tablet Take 1 tablet by mouth daily. (Patient not taking: Reported on 06/08/2018) 03/09/2018: Reports not taking   Facility-Administered Encounter Medications as of 06/08/2018  Medication  . 0.9 %  sodium chloride infusion    Functional Status:  In your present state of  health, do you have any difficulty performing the following activities: 12/23/2017  Hearing? N  Vision? N  Difficulty concentrating or making decisions? N  Walking or climbing stairs? Y  Comment get short of breath  Dressing or bathing? N  Doing errands, shopping? N  Preparing Food and eating ? N  Using the Toilet? N  In the past six months, have you accidently leaked urine? Y  Do you have problems with loss of bowel control? N  Managing your  Medications? N  Managing your Finances? Y  Comment daughter manages bills  Housekeeping or managing your Housekeeping? N  Some recent data might be hidden    Fall/Depression Screening: Fall Risk  06/08/2018 03/09/2018 12/23/2017  Falls in the past year? 0 0 No  Follow up Falls evaluation completed;Education provided;Falls prevention discussed Education provided;Falls prevention discussed -   PHQ 2/9 Scores 12/23/2017 12/11/2017  PHQ - 2 Score 0 0   THN CM Care Plan Problem One     Most Recent Value  Care Plan Problem One  Knowledge deficiet related to COPD  Role Documenting the Problem One  [place] for Problem One  Active  THN Long Term Goal   Patient will report seeking guidance from primary care provider for current sinus flare within the next 31 days.  THN Long Term Goal Start Date  06/08/18  THN Long Term Goal Met Date  06/08/18  Interventions for Problem One Long Term Goal  Discussed and reviewed current care plan and goals with patient, reviewed COPD action plan and signs and symptoms of each zone, reviewed medications and encouraged medication compliance, encouraged patient to contact primary care provider for appointment related to current sinus congestion, encouraged patient to verify follow up medical appointment and to attend scheduled appointments  [facility] CM Short Term Goal #1   Patient will verbalize the meaning of the COPD Action Plan Zones in the next 90 days  THN CM Short Term Goal #1 Start Date  03/09/18  [facility] CM Short Term Goal #1 Met Date  06/08/18  [facility] CM Short Term Goal #2   Patient will ask primary care provider about albuterol inhaler in the next 30 days.  THN CM Short Term Goal #2 Start Date  06/08/18  Interventions for Short Term Goal #2  Confirmed patient still does not have rescue inhaler, encouraged patient to discuss rescue inhaler with primary care provider and to request prescription with provider, encouraged patient to verify with pharmacy they  have not recieved prescription for rescue inhaler     Appointments:  Attended appointment with primary care provider, Dr. Rica Records on 03/25/2018 and thinks her follow up appointment is the end of this month, but unsure.  Encouraged patient to contact office to verify next appointment.  Plan: RN Health Coach will send primary care provider Quarterly Update. RN Health Coach will make next telephone outreach to patient within the month of May.  [place] Coach [phone] Honore [email or web address]

## 2018-06-19 ENCOUNTER — Other Ambulatory Visit: Payer: Self-pay

## 2018-06-19 DIAGNOSIS — I10 Essential (primary) hypertension: Secondary | ICD-10-CM

## 2018-06-19 DIAGNOSIS — Z7901 Long term (current) use of anticoagulants: Secondary | ICD-10-CM

## 2018-06-19 DIAGNOSIS — E876 Hypokalemia: Secondary | ICD-10-CM

## 2018-06-19 DIAGNOSIS — I480 Paroxysmal atrial fibrillation: Secondary | ICD-10-CM

## 2018-06-19 MED ORDER — RIVAROXABAN 20 MG PO TABS: 20.0000 mg | ORAL_TABLET | Freq: Every day | ORAL | 0 refills | Status: DC

## 2018-06-23 DIAGNOSIS — E786 Lipoprotein deficiency: Secondary | ICD-10-CM | POA: Diagnosis not present

## 2018-06-23 DIAGNOSIS — J209 Acute bronchitis, unspecified: Secondary | ICD-10-CM | POA: Diagnosis not present

## 2018-06-23 DIAGNOSIS — J019 Acute sinusitis, unspecified: Secondary | ICD-10-CM | POA: Diagnosis not present

## 2018-06-23 DIAGNOSIS — R6889 Other general symptoms and signs: Secondary | ICD-10-CM | POA: Diagnosis not present

## 2018-07-15 DIAGNOSIS — E039 Hypothyroidism, unspecified: Secondary | ICD-10-CM | POA: Diagnosis not present

## 2018-07-15 DIAGNOSIS — M7989 Other specified soft tissue disorders: Secondary | ICD-10-CM | POA: Diagnosis not present

## 2018-07-15 DIAGNOSIS — E1140 Type 2 diabetes mellitus with diabetic neuropathy, unspecified: Secondary | ICD-10-CM | POA: Diagnosis not present

## 2018-07-15 DIAGNOSIS — I10 Essential (primary) hypertension: Secondary | ICD-10-CM | POA: Diagnosis not present

## 2018-07-15 DIAGNOSIS — E559 Vitamin D deficiency, unspecified: Secondary | ICD-10-CM | POA: Diagnosis not present

## 2018-07-15 DIAGNOSIS — J309 Allergic rhinitis, unspecified: Secondary | ICD-10-CM | POA: Diagnosis not present

## 2018-07-15 DIAGNOSIS — E876 Hypokalemia: Secondary | ICD-10-CM | POA: Diagnosis not present

## 2018-07-21 DIAGNOSIS — D509 Iron deficiency anemia, unspecified: Secondary | ICD-10-CM | POA: Diagnosis not present

## 2018-07-21 DIAGNOSIS — E876 Hypokalemia: Secondary | ICD-10-CM | POA: Diagnosis not present

## 2018-08-10 ENCOUNTER — Other Ambulatory Visit: Payer: Self-pay | Admitting: Cardiology

## 2018-08-10 DIAGNOSIS — I10 Essential (primary) hypertension: Secondary | ICD-10-CM

## 2018-08-10 DIAGNOSIS — Z7901 Long term (current) use of anticoagulants: Secondary | ICD-10-CM

## 2018-08-10 DIAGNOSIS — E876 Hypokalemia: Secondary | ICD-10-CM

## 2018-08-10 DIAGNOSIS — I480 Paroxysmal atrial fibrillation: Secondary | ICD-10-CM

## 2018-08-12 DIAGNOSIS — E876 Hypokalemia: Secondary | ICD-10-CM | POA: Diagnosis not present

## 2018-08-28 DIAGNOSIS — E1122 Type 2 diabetes mellitus with diabetic chronic kidney disease: Secondary | ICD-10-CM | POA: Diagnosis not present

## 2018-08-28 DIAGNOSIS — R809 Proteinuria, unspecified: Secondary | ICD-10-CM | POA: Diagnosis not present

## 2018-08-28 DIAGNOSIS — N183 Chronic kidney disease, stage 3 (moderate): Secondary | ICD-10-CM | POA: Diagnosis not present

## 2018-08-28 DIAGNOSIS — D631 Anemia in chronic kidney disease: Secondary | ICD-10-CM | POA: Diagnosis not present

## 2018-08-28 DIAGNOSIS — N189 Chronic kidney disease, unspecified: Secondary | ICD-10-CM | POA: Diagnosis not present

## 2018-08-28 DIAGNOSIS — I129 Hypertensive chronic kidney disease with stage 1 through stage 4 chronic kidney disease, or unspecified chronic kidney disease: Secondary | ICD-10-CM | POA: Diagnosis not present

## 2018-08-28 DIAGNOSIS — N2581 Secondary hyperparathyroidism of renal origin: Secondary | ICD-10-CM | POA: Diagnosis not present

## 2018-09-01 DIAGNOSIS — I10 Essential (primary) hypertension: Secondary | ICD-10-CM | POA: Diagnosis not present

## 2018-09-01 DIAGNOSIS — N289 Disorder of kidney and ureter, unspecified: Secondary | ICD-10-CM | POA: Diagnosis not present

## 2018-09-01 DIAGNOSIS — K219 Gastro-esophageal reflux disease without esophagitis: Secondary | ICD-10-CM | POA: Diagnosis not present

## 2018-09-01 DIAGNOSIS — E11610 Type 2 diabetes mellitus with diabetic neuropathic arthropathy: Secondary | ICD-10-CM | POA: Diagnosis not present

## 2018-09-01 DIAGNOSIS — E876 Hypokalemia: Secondary | ICD-10-CM | POA: Diagnosis not present

## 2018-09-03 ENCOUNTER — Other Ambulatory Visit: Payer: Self-pay | Admitting: *Deleted

## 2018-09-03 NOTE — Patient Outreach (Signed)
[place] [facility]) Care Management  09/03/2018  KISSA CAMPOY 13-Aug-1950 [id number]   [place] Quarterly Outreach  Referral Date:12/11/2017 Referral Source:EMMI Prevent Screening Reason for Referral:Disease Management Education Insurance:[employer] Medicare   Outreach Attempt:  Outreach attempt #1 to patient for follow up. No answer and unable to leave voicemail message due to voicemail box being full..  Plan:  RN Health Coach will make another outreach attempt within the month of June.  [place] [phone] [email or web address]

## 2018-09-10 DIAGNOSIS — E876 Hypokalemia: Secondary | ICD-10-CM | POA: Diagnosis not present

## 2018-09-10 DIAGNOSIS — E785 Hyperlipidemia, unspecified: Secondary | ICD-10-CM | POA: Diagnosis not present

## 2018-09-18 ENCOUNTER — Other Ambulatory Visit: Payer: Self-pay | Admitting: Cardiology

## 2018-09-18 DIAGNOSIS — E876 Hypokalemia: Secondary | ICD-10-CM

## 2018-09-18 DIAGNOSIS — Z7901 Long term (current) use of anticoagulants: Secondary | ICD-10-CM

## 2018-09-18 DIAGNOSIS — I10 Essential (primary) hypertension: Secondary | ICD-10-CM

## 2018-09-18 DIAGNOSIS — I480 Paroxysmal atrial fibrillation: Secondary | ICD-10-CM

## 2018-09-28 DIAGNOSIS — E876 Hypokalemia: Secondary | ICD-10-CM | POA: Diagnosis not present

## 2018-09-30 ENCOUNTER — Encounter: Payer: Self-pay | Admitting: *Deleted

## 2018-09-30 ENCOUNTER — Other Ambulatory Visit: Payer: Self-pay | Admitting: *Deleted

## 2018-09-30 NOTE — Patient Outreach (Addendum)
[name] [name] [facility]) Care Management  [facility] Care Manager  09/30/2018   [name] [name] [birth year] [id number]   RN Health Coach Quarterly Outreach   Referral Date:  12/11/2017 Referral Source:  EMMI Prevent Screening Reason for Referral:  Disease Management Education Insurance:  [employer]    Outreach Attempt:  Successful telephone outreach to patient for quarterly follow up.  HIPAA verified with patient. Patient reporting increase in shortness of breath and having swelling in her bilateral lower extremities.  States she has been taking her fluid medication (Aldactone) twice a day this week and the swelling in her legs has improved some.  Encouraged patient to contact primary care provider for an appointment and guidance.  Discussed daily weight monitoring and patient stating her scale is not working a this time.  States she will change the batteries and if scale continues to not work, she will obtain another.  Discussed inhalers and patient stating she only has an Symbicort inhaler that she uses when she feels short of breath.  Discussed with patient Symbicort is a maintenance medication and encouraged her to discuss dosing with primary care provider.    Encounter Medications:  Outpatient Encounter Medications as of 09/30/2018  Medication Sig Note  . aspirin EC 81 MG tablet Take 81 mg by mouth daily.   . budesonide-formoterol (SYMBICORT) 160-4.5 MCG/ACT inhaler Inhale 2 puffs into the lungs 2 (two) times daily. 09/30/2018: Reports taking medication as needed  . cholecalciferol (VITAMIN D) 1000 UNITS tablet Take 2,000 Units by mouth daily. 12/23/2017: Reports taking 50, 000 units weekly and 2,000 units every other day  . diltiazem (CARDIZEM CD) 120 MG 24 hr capsule TAKE 1 CAPSULE BY MOUTH DAILY.   Marland Kitchen docusate sodium (COLACE) 100 MG capsule Take 200 mg by mouth 2 (two) times daily as needed for mild constipation.   . ferrous sulfate 325 (65 FE) MG EC tablet Take 325 mg by  mouth 3 (three) times daily with meals.   . fluticasone (FLONASE) 50 MCG/ACT nasal spray Place 2 sprays into both nostrils daily as needed for allergies or rhinitis.   Marland Kitchen gabapentin (NEURONTIN) 300 MG capsule Take 300 mg by mouth 3 (three) times daily.   . hydrochlorothiazide (MICROZIDE) 12.5 MG capsule daily.   Marland Kitchen levothyroxine (SYNTHROID, LEVOTHROID) 112 MCG tablet Take 112 mcg by mouth daily before breakfast.   . loratadine (CLARITIN) 10 MG tablet Take 10 mg by mouth daily as needed for allergies.   Marland Kitchen losartan (COZAAR) 50 MG tablet Take 50 mg by mouth daily.   . montelukast (SINGULAIR) 10 MG tablet Take 10 mg by mouth at bedtime.   Marland Kitchen omega-3 acid ethyl esters (LOVAZA) 1 G capsule Take 4 g by mouth daily.   Marland Kitchen omeprazole (PRILOSEC) 20 MG capsule Take 20 mg by mouth daily.   . potassium chloride SA (K-DUR,KLOR-CON) 20 MEQ tablet Take 100-120 mEq by mouth See admin instructions. Pt to take 6 tablets in the morning, and 5 tablets in the evening 12/23/2017: Reports taking 10 meq twice a day  . spironolactone (ALDACTONE) 25 MG tablet Take 25 mg by mouth daily. 09/30/2018: Reports taking 25 mg twice a day  . venlafaxine XR (EFFEXOR-XR) 75 MG 24 hr capsule Take 75 mg by mouth daily with breakfast.   . XARELTO 20 MG TABS tablet TAKE 1 TABLET(20 MG) BY MOUTH DAILY   . albuterol (PROVENTIL HFA;VENTOLIN HFA) 108 (90 BASE) MCG/ACT inhaler Inhale 2 puffs into the lungs every 6 (six) hours as needed for  wheezing or shortness of breath. (Patient not taking: Reported on 03/09/2018) 12/24/2017: Reports she cannot find this inhaler  . losartan-hydrochlorothiazide (HYZAAR) 100-25 MG per tablet Take 1 tablet by mouth daily. (Patient not taking: Reported on 06/08/2018) 03/09/2018: Reports not taking  . tiotropium (SPIRIVA) 18 MCG inhalation capsule Place 18 mcg into inhaler and inhale daily. 09/30/2018: Reports not taking   Facility-Administered Encounter Medications as of 09/30/2018  Medication  . 0.9 %  sodium chloride  infusion    Functional Status:  In your present state of health, do you have any difficulty performing the following activities: 12/23/2017  Hearing? N  Vision? N  Difficulty concentrating or making decisions? N  Walking or climbing stairs? Y  Comment get short of breath  Dressing or bathing? N  Doing errands, shopping? N  Preparing Food and eating ? N  Using the Toilet? N  In the past six months, have you accidently leaked urine? Y  Do you have problems with loss of bowel control? N  Managing your Medications? N  Managing your Finances? Y  Comment daughter manages bills  Housekeeping or managing your Housekeeping? N  Some recent data might be hidden    Fall/Depression Screening: Fall Risk  09/30/2018 06/08/2018 03/09/2018  Falls in the past year? 0 0 0  Follow up Falls evaluation completed;Education provided;Falls prevention discussed Falls evaluation completed;Education provided;Falls prevention discussed Education provided;Falls prevention discussed   PHQ 2/9 Scores 12/23/2017 12/11/2017  PHQ - 2 Score 0 0   THN CM Care Plan Problem One     Most Recent Value  Care Plan Problem One  Knowledge deficiet related to COPD  Role Documenting the Problem One  [place] for Problem One  Active  THN Long Term Goal   Patient will report no hospitalizations in the next 90 days.  THN Long Term Goal Start Date  09/30/18  THN Long Term Goal Met Date  09/30/18  Interventions for Problem One Long Term Goal  Current care plan and goals reviewed and discussed, encouraged patient to contact primary care provider for sooner appointment due to shortness of breath, lower extremity swelling and to review medications, discussed indication and administration of symbicort inhaler (not used as prn medication but more of a maintainence), reviewed medications and encouraged patient to review all medications and dosages with primary care provider, encouraged patient to discuss medication  adjustments suggested by nephrologist with her primary care provider, discussed and encouraged daily weight monitoring, discussed rescue inhaler purpose and use-encouraged patient to discuss rescue inhaler with primary care provider, discussed and encouraged low salt diet  THN CM Short Term Goal #1   Patient will report a decrease in shortness of breath and lower extremity edema in the next 20 days.  THN CM Short Term Goal #1 Start Date  09/30/18  Interventions for Short Term Goal #1  Reviewed signs and symptoms of fluid overload, encouraged patient to weight herself daily and discussed when to call physician based on weight, discussed trouble shooting home scale and verifed ability to purchase another scale if she can not get hers to work, reviewed medications and encouraged medication compliance, encouraged elevation of lower extremities, encouraged patient to contact primary care provider to discuss shortness of breath and lower extremity swelling  THN CM Short Term Goal #2   Patient will ask primary care provider about albuterol inhaler in the next 30 days.  THN CM Short Term Goal #2 Start Date  06/08/18  Appointments:  Reports attending appointment with primary care provider on 07/15/2018 and has scheduled Telehealth appointment on 10/22/2018.  States she has follow up appointment with Nephrologist in September 2020.  Encouraged patient to contact primary care provider concerning her symptoms as soon as possible.  Patient stating she will call primary care provider.  Plan: RN Health Coach will send primary care provider quarterly update. RN Health Coach will make next telephone outreach to patient within the month of July.  [place] [phone] [place] [email or web address]

## 2018-10-08 DIAGNOSIS — L299 Pruritus, unspecified: Secondary | ICD-10-CM | POA: Diagnosis not present

## 2018-10-08 DIAGNOSIS — R609 Edema, unspecified: Secondary | ICD-10-CM | POA: Diagnosis not present

## 2018-10-08 DIAGNOSIS — Z79899 Other long term (current) drug therapy: Secondary | ICD-10-CM | POA: Diagnosis not present

## 2018-10-08 DIAGNOSIS — D649 Anemia, unspecified: Secondary | ICD-10-CM | POA: Diagnosis not present

## 2018-10-22 DIAGNOSIS — M109 Gout, unspecified: Secondary | ICD-10-CM | POA: Diagnosis not present

## 2018-10-29 ENCOUNTER — Encounter: Payer: Self-pay | Admitting: *Deleted

## 2018-10-29 ENCOUNTER — Other Ambulatory Visit: Payer: Self-pay | Admitting: *Deleted

## 2018-10-29 NOTE — Patient Outreach (Signed)
[place] [facility]) Care Management  10/29/2018  [name] [name] Aug 03, 1950 [id number]   [place] Monthly Outreach  Referral Date:12/11/2017 Referral Source:EMMI Prevent Screening Reason for Referral:Disease Management Education Insurance:[employer] Medicare   Outreach Attempt:  Successful telephone outreach to patient for follow up.  HIPAA verified with patient.  Patient reporting she is doing better.  States she recently had a gout flare in her right foot and was treated with prednisone taper.  Reports the swelling in her legs have decreased.  Denies any shortness of breath at this time.  Patient stating she is now using her Symbicort inhaler at least once daily sometimes twice and her Spiriva only occasionally.  Discussed importance of following medication instructions.  Appointment:  Attended appointment with primary care provider on 10/22/2018.  States she plans to arrange follow up appointment soon.  Plan: RN Health Coach will make next telephone outreach to patient within the month of September.  [place] [phone] [name]

## 2018-11-11 DIAGNOSIS — I10 Essential (primary) hypertension: Secondary | ICD-10-CM | POA: Diagnosis not present

## 2018-11-11 DIAGNOSIS — J209 Acute bronchitis, unspecified: Secondary | ICD-10-CM | POA: Diagnosis not present

## 2018-11-11 DIAGNOSIS — J309 Allergic rhinitis, unspecified: Secondary | ICD-10-CM | POA: Diagnosis not present

## 2018-11-11 DIAGNOSIS — R600 Localized edema: Secondary | ICD-10-CM | POA: Diagnosis not present

## 2018-11-12 DIAGNOSIS — J209 Acute bronchitis, unspecified: Secondary | ICD-10-CM | POA: Diagnosis not present

## 2018-11-12 DIAGNOSIS — E039 Hypothyroidism, unspecified: Secondary | ICD-10-CM | POA: Diagnosis not present

## 2018-11-12 DIAGNOSIS — E11610 Type 2 diabetes mellitus with diabetic neuropathic arthropathy: Secondary | ICD-10-CM | POA: Diagnosis not present

## 2018-11-12 DIAGNOSIS — E785 Hyperlipidemia, unspecified: Secondary | ICD-10-CM | POA: Diagnosis not present

## 2018-11-12 DIAGNOSIS — D649 Anemia, unspecified: Secondary | ICD-10-CM | POA: Diagnosis not present

## 2018-11-16 DIAGNOSIS — R0602 Shortness of breath: Secondary | ICD-10-CM | POA: Diagnosis not present

## 2018-11-17 DIAGNOSIS — U071 COVID-19: Secondary | ICD-10-CM | POA: Diagnosis not present

## 2018-11-17 DIAGNOSIS — R069 Unspecified abnormalities of breathing: Secondary | ICD-10-CM | POA: Diagnosis not present

## 2018-11-17 DIAGNOSIS — R0689 Other abnormalities of breathing: Secondary | ICD-10-CM | POA: Diagnosis not present

## 2018-11-17 DIAGNOSIS — J189 Pneumonia, unspecified organism: Secondary | ICD-10-CM | POA: Diagnosis not present

## 2018-11-17 DIAGNOSIS — E119 Type 2 diabetes mellitus without complications: Secondary | ICD-10-CM | POA: Diagnosis not present

## 2018-11-17 DIAGNOSIS — R0602 Shortness of breath: Secondary | ICD-10-CM | POA: Diagnosis not present

## 2018-11-17 DIAGNOSIS — J9601 Acute respiratory failure with hypoxia: Secondary | ICD-10-CM | POA: Diagnosis not present

## 2018-11-17 DIAGNOSIS — I4891 Unspecified atrial fibrillation: Secondary | ICD-10-CM | POA: Diagnosis not present

## 2018-11-18 ENCOUNTER — Inpatient Hospital Stay (HOSPITAL_COMMUNITY)
Admission: EM | Admit: 2018-11-18 | Discharge: 2018-12-29 | DRG: 004 | Disposition: E | Payer: Medicare Other | Source: Other Acute Inpatient Hospital | Attending: Internal Medicine | Admitting: Internal Medicine

## 2018-11-18 ENCOUNTER — Inpatient Hospital Stay (HOSPITAL_COMMUNITY): Payer: Medicare Other

## 2018-11-18 DIAGNOSIS — Z9911 Dependence on respirator [ventilator] status: Secondary | ICD-10-CM

## 2018-11-18 DIAGNOSIS — J1289 Other viral pneumonia: Secondary | ICD-10-CM | POA: Diagnosis present

## 2018-11-18 DIAGNOSIS — Z7951 Long term (current) use of inhaled steroids: Secondary | ICD-10-CM

## 2018-11-18 DIAGNOSIS — E118 Type 2 diabetes mellitus with unspecified complications: Secondary | ICD-10-CM | POA: Diagnosis not present

## 2018-11-18 DIAGNOSIS — J982 Interstitial emphysema: Secondary | ICD-10-CM | POA: Diagnosis not present

## 2018-11-18 DIAGNOSIS — R0602 Shortness of breath: Secondary | ICD-10-CM | POA: Diagnosis not present

## 2018-11-18 DIAGNOSIS — K5900 Constipation, unspecified: Secondary | ICD-10-CM

## 2018-11-18 DIAGNOSIS — Z4682 Encounter for fitting and adjustment of non-vascular catheter: Secondary | ICD-10-CM | POA: Diagnosis not present

## 2018-11-18 DIAGNOSIS — U071 COVID-19: Principal | ICD-10-CM | POA: Diagnosis present

## 2018-11-18 DIAGNOSIS — E039 Hypothyroidism, unspecified: Secondary | ICD-10-CM

## 2018-11-18 DIAGNOSIS — E876 Hypokalemia: Secondary | ICD-10-CM | POA: Diagnosis not present

## 2018-11-18 DIAGNOSIS — A4102 Sepsis due to Methicillin resistant Staphylococcus aureus: Secondary | ICD-10-CM | POA: Diagnosis not present

## 2018-11-18 DIAGNOSIS — R748 Abnormal levels of other serum enzymes: Secondary | ICD-10-CM | POA: Diagnosis not present

## 2018-11-18 DIAGNOSIS — J181 Lobar pneumonia, unspecified organism: Secondary | ICD-10-CM | POA: Diagnosis not present

## 2018-11-18 DIAGNOSIS — I4821 Permanent atrial fibrillation: Secondary | ICD-10-CM | POA: Diagnosis not present

## 2018-11-18 DIAGNOSIS — L8990 Pressure ulcer of unspecified site, unspecified stage: Secondary | ICD-10-CM | POA: Diagnosis not present

## 2018-11-18 DIAGNOSIS — I38 Endocarditis, valve unspecified: Secondary | ICD-10-CM | POA: Diagnosis not present

## 2018-11-18 DIAGNOSIS — J986 Disorders of diaphragm: Secondary | ICD-10-CM | POA: Diagnosis not present

## 2018-11-18 DIAGNOSIS — Z8249 Family history of ischemic heart disease and other diseases of the circulatory system: Secondary | ICD-10-CM

## 2018-11-18 DIAGNOSIS — E1122 Type 2 diabetes mellitus with diabetic chronic kidney disease: Secondary | ICD-10-CM | POA: Diagnosis not present

## 2018-11-18 DIAGNOSIS — I472 Ventricular tachycardia, unspecified: Secondary | ICD-10-CM

## 2018-11-18 DIAGNOSIS — D696 Thrombocytopenia, unspecified: Secondary | ICD-10-CM | POA: Diagnosis not present

## 2018-11-18 DIAGNOSIS — N179 Acute kidney failure, unspecified: Secondary | ICD-10-CM | POA: Diagnosis not present

## 2018-11-18 DIAGNOSIS — R531 Weakness: Secondary | ICD-10-CM | POA: Diagnosis not present

## 2018-11-18 DIAGNOSIS — R601 Generalized edema: Secondary | ICD-10-CM | POA: Diagnosis present

## 2018-11-18 DIAGNOSIS — D62 Acute posthemorrhagic anemia: Secondary | ICD-10-CM | POA: Diagnosis not present

## 2018-11-18 DIAGNOSIS — N178 Other acute kidney failure: Secondary | ICD-10-CM | POA: Diagnosis not present

## 2018-11-18 DIAGNOSIS — J9601 Acute respiratory failure with hypoxia: Secondary | ICD-10-CM | POA: Diagnosis not present

## 2018-11-18 DIAGNOSIS — K921 Melena: Secondary | ICD-10-CM | POA: Diagnosis not present

## 2018-11-18 DIAGNOSIS — E1169 Type 2 diabetes mellitus with other specified complication: Secondary | ICD-10-CM

## 2018-11-18 DIAGNOSIS — R6521 Severe sepsis with septic shock: Secondary | ICD-10-CM | POA: Diagnosis not present

## 2018-11-18 DIAGNOSIS — D649 Anemia, unspecified: Secondary | ICD-10-CM | POA: Diagnosis not present

## 2018-11-18 DIAGNOSIS — J80 Acute respiratory distress syndrome: Secondary | ICD-10-CM | POA: Diagnosis not present

## 2018-11-18 DIAGNOSIS — N3289 Other specified disorders of bladder: Secondary | ICD-10-CM | POA: Diagnosis not present

## 2018-11-18 DIAGNOSIS — I952 Hypotension due to drugs: Secondary | ICD-10-CM | POA: Diagnosis not present

## 2018-11-18 DIAGNOSIS — I482 Chronic atrial fibrillation, unspecified: Secondary | ICD-10-CM | POA: Diagnosis not present

## 2018-11-18 DIAGNOSIS — R0689 Other abnormalities of breathing: Secondary | ICD-10-CM | POA: Diagnosis not present

## 2018-11-18 DIAGNOSIS — J9600 Acute respiratory failure, unspecified whether with hypoxia or hypercapnia: Secondary | ICD-10-CM

## 2018-11-18 DIAGNOSIS — N183 Chronic kidney disease, stage 3 unspecified: Secondary | ICD-10-CM | POA: Diagnosis present

## 2018-11-18 DIAGNOSIS — Z452 Encounter for adjustment and management of vascular access device: Secondary | ICD-10-CM | POA: Diagnosis not present

## 2018-11-18 DIAGNOSIS — Z930 Tracheostomy status: Secondary | ICD-10-CM

## 2018-11-18 DIAGNOSIS — T8089XA Other complications following infusion, transfusion and therapeutic injection, initial encounter: Secondary | ICD-10-CM | POA: Diagnosis not present

## 2018-11-18 DIAGNOSIS — Z4659 Encounter for fitting and adjustment of other gastrointestinal appliance and device: Secondary | ICD-10-CM | POA: Diagnosis not present

## 2018-11-18 DIAGNOSIS — I1310 Hypertensive heart and chronic kidney disease without heart failure, with stage 1 through stage 4 chronic kidney disease, or unspecified chronic kidney disease: Secondary | ICD-10-CM | POA: Diagnosis not present

## 2018-11-18 DIAGNOSIS — E559 Vitamin D deficiency, unspecified: Secondary | ICD-10-CM | POA: Diagnosis present

## 2018-11-18 DIAGNOSIS — I252 Old myocardial infarction: Secondary | ICD-10-CM

## 2018-11-18 DIAGNOSIS — I480 Paroxysmal atrial fibrillation: Secondary | ICD-10-CM | POA: Diagnosis not present

## 2018-11-18 DIAGNOSIS — I700 Atherosclerosis of aorta: Secondary | ICD-10-CM | POA: Diagnosis not present

## 2018-11-18 DIAGNOSIS — R7881 Bacteremia: Secondary | ICD-10-CM | POA: Diagnosis not present

## 2018-11-18 DIAGNOSIS — G8190 Hemiplegia, unspecified affecting unspecified side: Secondary | ICD-10-CM

## 2018-11-18 DIAGNOSIS — E870 Hyperosmolality and hypernatremia: Secondary | ICD-10-CM | POA: Diagnosis not present

## 2018-11-18 DIAGNOSIS — Z978 Presence of other specified devices: Secondary | ICD-10-CM | POA: Diagnosis not present

## 2018-11-18 DIAGNOSIS — A4189 Other specified sepsis: Secondary | ICD-10-CM | POA: Diagnosis not present

## 2018-11-18 DIAGNOSIS — Z86718 Personal history of other venous thrombosis and embolism: Secondary | ICD-10-CM

## 2018-11-18 DIAGNOSIS — T797XXA Traumatic subcutaneous emphysema, initial encounter: Secondary | ICD-10-CM

## 2018-11-18 DIAGNOSIS — J9690 Respiratory failure, unspecified, unspecified whether with hypoxia or hypercapnia: Secondary | ICD-10-CM | POA: Diagnosis not present

## 2018-11-18 DIAGNOSIS — I361 Nonrheumatic tricuspid (valve) insufficiency: Secondary | ICD-10-CM | POA: Diagnosis not present

## 2018-11-18 DIAGNOSIS — Z789 Other specified health status: Secondary | ICD-10-CM

## 2018-11-18 DIAGNOSIS — D473 Essential (hemorrhagic) thrombocythemia: Secondary | ICD-10-CM | POA: Diagnosis not present

## 2018-11-18 DIAGNOSIS — T797XXS Traumatic subcutaneous emphysema, sequela: Secondary | ICD-10-CM | POA: Diagnosis not present

## 2018-11-18 DIAGNOSIS — J1282 Pneumonia due to coronavirus disease 2019: Secondary | ICD-10-CM | POA: Diagnosis present

## 2018-11-18 DIAGNOSIS — Z79890 Hormone replacement therapy (postmenopausal): Secondary | ICD-10-CM

## 2018-11-18 DIAGNOSIS — J440 Chronic obstructive pulmonary disease with acute lower respiratory infection: Secondary | ICD-10-CM | POA: Diagnosis present

## 2018-11-18 DIAGNOSIS — J439 Emphysema, unspecified: Secondary | ICD-10-CM | POA: Diagnosis not present

## 2018-11-18 DIAGNOSIS — Z8673 Personal history of transient ischemic attack (TIA), and cerebral infarction without residual deficits: Secondary | ICD-10-CM | POA: Diagnosis not present

## 2018-11-18 DIAGNOSIS — R319 Hematuria, unspecified: Secondary | ICD-10-CM | POA: Diagnosis not present

## 2018-11-18 DIAGNOSIS — Z01818 Encounter for other preprocedural examination: Secondary | ICD-10-CM

## 2018-11-18 DIAGNOSIS — Z8711 Personal history of peptic ulcer disease: Secondary | ICD-10-CM

## 2018-11-18 DIAGNOSIS — E875 Hyperkalemia: Secondary | ICD-10-CM | POA: Diagnosis not present

## 2018-11-18 DIAGNOSIS — R609 Edema, unspecified: Secondary | ICD-10-CM | POA: Diagnosis not present

## 2018-11-18 DIAGNOSIS — I517 Cardiomegaly: Secondary | ICD-10-CM | POA: Diagnosis not present

## 2018-11-18 DIAGNOSIS — E1165 Type 2 diabetes mellitus with hyperglycemia: Secondary | ICD-10-CM | POA: Diagnosis present

## 2018-11-18 DIAGNOSIS — J9811 Atelectasis: Secondary | ICD-10-CM | POA: Diagnosis not present

## 2018-11-18 DIAGNOSIS — R918 Other nonspecific abnormal finding of lung field: Secondary | ICD-10-CM | POA: Diagnosis not present

## 2018-11-18 DIAGNOSIS — B9562 Methicillin resistant Staphylococcus aureus infection as the cause of diseases classified elsewhere: Secondary | ICD-10-CM | POA: Diagnosis present

## 2018-11-18 DIAGNOSIS — J45909 Unspecified asthma, uncomplicated: Secondary | ICD-10-CM | POA: Diagnosis not present

## 2018-11-18 DIAGNOSIS — Z931 Gastrostomy status: Secondary | ICD-10-CM | POA: Diagnosis not present

## 2018-11-18 DIAGNOSIS — I10 Essential (primary) hypertension: Secondary | ICD-10-CM | POA: Diagnosis present

## 2018-11-18 DIAGNOSIS — Z7982 Long term (current) use of aspirin: Secondary | ICD-10-CM

## 2018-11-18 DIAGNOSIS — K219 Gastro-esophageal reflux disease without esophagitis: Secondary | ICD-10-CM | POA: Diagnosis present

## 2018-11-18 DIAGNOSIS — J150 Pneumonia due to Klebsiella pneumoniae: Secondary | ICD-10-CM | POA: Diagnosis not present

## 2018-11-18 DIAGNOSIS — Q2546 Tortuous aortic arch: Secondary | ICD-10-CM | POA: Diagnosis not present

## 2018-11-18 DIAGNOSIS — Z7901 Long term (current) use of anticoagulants: Secondary | ICD-10-CM

## 2018-11-18 DIAGNOSIS — J069 Acute upper respiratory infection, unspecified: Secondary | ICD-10-CM | POA: Diagnosis not present

## 2018-11-18 DIAGNOSIS — Z79899 Other long term (current) drug therapy: Secondary | ICD-10-CM

## 2018-11-18 DIAGNOSIS — Z4689 Encounter for fitting and adjustment of other specified devices: Secondary | ICD-10-CM | POA: Diagnosis not present

## 2018-11-18 DIAGNOSIS — I4891 Unspecified atrial fibrillation: Secondary | ICD-10-CM | POA: Diagnosis not present

## 2018-11-18 DIAGNOSIS — D72829 Elevated white blood cell count, unspecified: Secondary | ICD-10-CM | POA: Diagnosis not present

## 2018-11-18 DIAGNOSIS — A419 Sepsis, unspecified organism: Secondary | ICD-10-CM | POA: Diagnosis not present

## 2018-11-18 DIAGNOSIS — I4510 Unspecified right bundle-branch block: Secondary | ICD-10-CM | POA: Diagnosis present

## 2018-11-18 DIAGNOSIS — Z882 Allergy status to sulfonamides status: Secondary | ICD-10-CM

## 2018-11-18 DIAGNOSIS — Z6841 Body Mass Index (BMI) 40.0 and over, adult: Secondary | ICD-10-CM | POA: Diagnosis not present

## 2018-11-18 DIAGNOSIS — R030 Elevated blood-pressure reading, without diagnosis of hypertension: Secondary | ICD-10-CM | POA: Diagnosis not present

## 2018-11-18 DIAGNOSIS — R0902 Hypoxemia: Secondary | ICD-10-CM

## 2018-11-18 DIAGNOSIS — E119 Type 2 diabetes mellitus without complications: Secondary | ICD-10-CM | POA: Diagnosis not present

## 2018-11-18 DIAGNOSIS — J9611 Chronic respiratory failure with hypoxia: Secondary | ICD-10-CM | POA: Diagnosis not present

## 2018-11-18 LAB — COMPREHENSIVE METABOLIC PANEL
ALT: 34 U/L (ref 0–44)
AST: 45 U/L — ABNORMAL HIGH (ref 15–41)
Albumin: 2.7 g/dL — ABNORMAL LOW (ref 3.5–5.0)
Alkaline Phosphatase: 90 U/L (ref 38–126)
Anion gap: 14 (ref 5–15)
BUN: 38 mg/dL — ABNORMAL HIGH (ref 8–23)
CO2: 21 mmol/L — ABNORMAL LOW (ref 22–32)
Calcium: 8.9 mg/dL (ref 8.9–10.3)
Chloride: 103 mmol/L (ref 98–111)
Creatinine, Ser: 1.45 mg/dL — ABNORMAL HIGH (ref 0.44–1.00)
GFR calc Af Amer: 43 mL/min — ABNORMAL LOW (ref >60)
GFR calc non Af Amer: 37 mL/min — ABNORMAL LOW (ref >60)
Glucose, Bld: 245 mg/dL — ABNORMAL HIGH (ref 70–99)
Potassium: 4.9 mmol/L (ref 3.5–5.1)
Sodium: 138 mmol/L (ref 135–145)
Total Bilirubin: 0.7 mg/dL (ref 0.3–1.2)
Total Protein: 7.2 g/dL (ref 6.5–8.1)

## 2018-11-18 LAB — C-REACTIVE PROTEIN: CRP: 17.4 mg/dL — ABNORMAL HIGH (ref <1.0)

## 2018-11-18 LAB — CBC
HCT: 40.7 % (ref 36.0–46.0)
Hemoglobin: 12.8 g/dL (ref 12.0–15.0)
MCH: 26.8 pg (ref 26.0–34.0)
MCHC: 31.4 g/dL (ref 30.0–36.0)
MCV: 85.3 fL (ref 80.0–100.0)
Platelets: 138 10*3/uL — ABNORMAL LOW (ref 150–400)
RBC: 4.77 MIL/uL (ref 3.87–5.11)
RDW: 12.9 % (ref 11.5–15.5)
WBC: 10.7 10*3/uL — ABNORMAL HIGH (ref 4.0–10.5)
nRBC: 0.2 % (ref 0.0–0.2)

## 2018-11-18 LAB — HEMOGLOBIN A1C
Hgb A1c MFr Bld: 7 % — ABNORMAL HIGH (ref 4.8–5.6)
Mean Plasma Glucose: 154.2 mg/dL

## 2018-11-18 LAB — GLUCOSE, CAPILLARY: Glucose-Capillary: 226 mg/dL — ABNORMAL HIGH (ref 70–99)

## 2018-11-18 LAB — D-DIMER, QUANTITATIVE: D-Dimer, Quant: 0.57 ug/mL-FEU — ABNORMAL HIGH (ref 0.00–0.50)

## 2018-11-18 LAB — FERRITIN: Ferritin: 1209 ng/mL — ABNORMAL HIGH (ref 11–307)

## 2018-11-18 MED ORDER — DEXAMETHASONE SODIUM PHOSPHATE 10 MG/ML IJ SOLN
6.0000 mg | Freq: Two times a day (BID) | INTRAMUSCULAR | Status: DC
  Administered 2018-11-18 – 2018-11-23 (×10): 6 mg via INTRAVENOUS
  Filled 2018-11-18 (×10): qty 1

## 2018-11-18 MED ORDER — SODIUM CHLORIDE 0.9 % IV SOLN
INTRAVENOUS | Status: DC
  Administered 2018-11-18: 19:00:00 via INTRAVENOUS

## 2018-11-18 MED ORDER — ORAL CARE MOUTH RINSE
15.0000 mL | Freq: Two times a day (BID) | OROMUCOSAL | Status: DC
  Administered 2018-11-18 – 2018-11-19 (×2): 15 mL via OROMUCOSAL

## 2018-11-18 MED ORDER — VITAMIN D 25 MCG (1000 UNIT) PO TABS
2000.0000 [IU] | ORAL_TABLET | Freq: Every day | ORAL | Status: DC
  Administered 2018-11-19 – 2018-11-25 (×7): 2000 [IU] via ORAL
  Filled 2018-11-18 (×8): qty 2

## 2018-11-18 MED ORDER — INSULIN ASPART 100 UNIT/ML ~~LOC~~ SOLN
0.0000 [IU] | Freq: Every day | SUBCUTANEOUS | Status: DC
  Administered 2018-11-18: 21:00:00 2 [IU] via SUBCUTANEOUS

## 2018-11-18 MED ORDER — INSULIN ASPART 100 UNIT/ML ~~LOC~~ SOLN
3.0000 [IU] | Freq: Three times a day (TID) | SUBCUTANEOUS | Status: DC
  Administered 2018-11-19: 3 [IU] via SUBCUTANEOUS

## 2018-11-18 MED ORDER — SODIUM CHLORIDE 0.9 % IV SOLN
100.0000 mg | INTRAVENOUS | Status: AC
  Administered 2018-11-20 – 2018-11-22 (×4): 100 mg via INTRAVENOUS
  Filled 2018-11-18 (×4): qty 20

## 2018-11-18 MED ORDER — DILTIAZEM HCL ER COATED BEADS 120 MG PO CP24
120.0000 mg | ORAL_CAPSULE | Freq: Every day | ORAL | Status: DC
  Administered 2018-11-18 – 2018-11-20 (×3): 120 mg via ORAL
  Filled 2018-11-18 (×3): qty 1

## 2018-11-18 MED ORDER — BISACODYL 5 MG PO TBEC: 5.0000 mg | DELAYED_RELEASE_TABLET | Freq: Every day | ORAL | Status: DC | PRN

## 2018-11-18 MED ORDER — LEVOTHYROXINE SODIUM 112 MCG PO TABS
112.0000 ug | ORAL_TABLET | Freq: Every day | ORAL | Status: DC
  Administered 2018-11-19 – 2018-12-04 (×15): 112 ug via ORAL
  Filled 2018-11-18 (×16): qty 1

## 2018-11-18 MED ORDER — RIVAROXABAN 15 MG PO TABS
15.0000 mg | ORAL_TABLET | Freq: Every day | ORAL | Status: DC
  Administered 2018-11-18: 21:00:00 15 mg via ORAL
  Filled 2018-11-18 (×2): qty 1

## 2018-11-18 MED ORDER — INSULIN ASPART 100 UNIT/ML ~~LOC~~ SOLN
0.0000 [IU] | Freq: Three times a day (TID) | SUBCUTANEOUS | Status: DC
  Administered 2018-11-19: 5 [IU] via SUBCUTANEOUS

## 2018-11-18 MED ORDER — PANTOPRAZOLE SODIUM 40 MG PO TBEC
40.0000 mg | DELAYED_RELEASE_TABLET | Freq: Every day | ORAL | Status: DC
  Administered 2018-11-18 – 2018-11-20 (×3): 40 mg via ORAL
  Filled 2018-11-18 (×3): qty 1

## 2018-11-18 MED ORDER — ACETAMINOPHEN 650 MG RE SUPP: 650.0000 mg | Freq: Four times a day (QID) | RECTAL | Status: DC | PRN

## 2018-11-18 MED ORDER — UMECLIDINIUM BROMIDE 62.5 MCG/INH IN AEPB
1.0000 | INHALATION_SPRAY | Freq: Every day | RESPIRATORY_TRACT | Status: DC
  Administered 2018-11-18 – 2018-11-19 (×2): 1 via RESPIRATORY_TRACT
  Filled 2018-11-18: qty 7

## 2018-11-18 MED ORDER — FERROUS SULFATE 325 (65 FE) MG PO TABS
325.0000 mg | ORAL_TABLET | Freq: Three times a day (TID) | ORAL | Status: DC
  Administered 2018-11-19 – 2018-11-20 (×4): 325 mg via ORAL
  Filled 2018-11-18 (×6): qty 1

## 2018-11-18 MED ORDER — DILTIAZEM HCL 25 MG/5ML IV SOLN
10.0000 mg | Freq: Four times a day (QID) | INTRAVENOUS | Status: DC | PRN
  Filled 2018-11-18 (×2): qty 5

## 2018-11-18 MED ORDER — TOCILIZUMAB 400 MG/20ML IV SOLN
800.0000 mg | Freq: Once | INTRAVENOUS | Status: AC
  Administered 2018-11-18: 21:00:00 800 mg via INTRAVENOUS
  Filled 2018-11-18: qty 40

## 2018-11-18 MED ORDER — GABAPENTIN 100 MG PO CAPS
100.0000 mg | ORAL_CAPSULE | Freq: Three times a day (TID) | ORAL | Status: DC
  Administered 2018-11-18 – 2018-11-20 (×5): 100 mg via ORAL
  Filled 2018-11-18 (×6): qty 1

## 2018-11-18 MED ORDER — ALBUTEROL SULFATE HFA 108 (90 BASE) MCG/ACT IN AERS
2.0000 | INHALATION_SPRAY | Freq: Four times a day (QID) | RESPIRATORY_TRACT | Status: DC | PRN
  Filled 2018-11-18: qty 6.7

## 2018-11-18 MED ORDER — TIOTROPIUM BROMIDE MONOHYDRATE 18 MCG IN CAPS
18.0000 ug | ORAL_CAPSULE | Freq: Every day | RESPIRATORY_TRACT | Status: DC
  Filled 2018-11-18: qty 5

## 2018-11-18 MED ORDER — SODIUM CHLORIDE 0.9 % IV SOLN
200.0000 mg | Freq: Once | INTRAVENOUS | Status: AC
  Administered 2018-11-18: 20:00:00 200 mg via INTRAVENOUS
  Filled 2018-11-18: qty 40

## 2018-11-18 MED ORDER — ACETAMINOPHEN 325 MG PO TABS
650.0000 mg | ORAL_TABLET | Freq: Four times a day (QID) | ORAL | Status: DC | PRN
  Administered 2018-11-26 – 2018-12-09 (×7): 650 mg via ORAL
  Filled 2018-11-18 (×8): qty 2

## 2018-11-18 MED ORDER — ASPIRIN EC 81 MG PO TBEC
81.0000 mg | DELAYED_RELEASE_TABLET | Freq: Every day | ORAL | Status: DC
  Administered 2018-11-18 – 2018-11-22 (×5): 81 mg via ORAL
  Filled 2018-11-18 (×5): qty 1

## 2018-11-18 MED ORDER — TRAZODONE HCL 50 MG PO TABS
50.0000 mg | ORAL_TABLET | Freq: Every evening | ORAL | Status: DC | PRN
  Administered 2018-11-18 – 2018-12-07 (×4): 50 mg via ORAL
  Filled 2018-11-18 (×4): qty 1

## 2018-11-18 MED ORDER — DOCUSATE SODIUM 100 MG PO CAPS: 200.0000 mg | ORAL_CAPSULE | Freq: Two times a day (BID) | ORAL | Status: DC | PRN

## 2018-11-18 NOTE — Progress Notes (Signed)
Pharmacy Brief Note  O:  ALT: 34 (from [facility]) CXR: consistent with "patchy peripheral infiltrates bilaterally" from [facility] SpO2: 95 % on 30 L HFNC  A/P:  Patient meets criteria for remdesevir therapy. Will initiate remdesivir 200 mg iv once followed by 100 mg iv daily x 4 days. Will f/u ALT.   [place]apoleon Form, [facility] 11/22/2018 7:08 PM

## 2018-11-18 NOTE — Progress Notes (Signed)
[place] for Rivaroxaban Indication: atrial fibrillation  Allergies  Allergen Reactions  . Sulfonamide Derivatives Itching    Patient Measurements: Height: 5\' 3"  (160 cm) Weight: 141 lb 1.6 oz (64 kg) IBW/kg (Calculated) : 52.4  Vital Signs: Temp: 98 F (36.7 C) (07/22 1800) Temp Source: Oral (07/22 1800) BP: 94/81 (07/22 1800) Pulse Rate: 101 (07/22 1800)  Labs: No results for input(s): HGB, HCT, PLT, APTT, LABPROT, INR, HEPARINUNFRC, HEPRLOWMOCWT, CREATININE, CKTOTAL, CKMB, TROPONINIHS in the last 72 hours.  CrCl cannot be calculated (Patient's most recent lab result is older than the maximum 21 days allowed.).   Medical History: Past Medical History:  Diagnosis Date  . Arthritis   . Carpal tunnel syndrome   . Cataract   . Depression   . Esophagitis   . GERD (gastroesophageal reflux disease)   . Graves disease   . History of DVT (deep vein thrombosis)   . Hypertension   . Hypothyroidism   . MI (myocardial infarction) ([place])   . Mood disorder ([place])   . Peptic ulcer disease   . Pre-diabetes   . Stroke ([place])   . Thyroid disease   . Venous insufficiency   . Vitamin D deficiency     Medications:  PTA rivaroxaban 20 mg daily  Assessment: 68 y/o F with a h/o atrial fibrillation on rivaroxaban 20 mg daily admitted with COVID-19 PNA. Last dose of rivaroxaban was 7/21. Patient has a h/o CKD III with elevated SCr of 2.2 at [facility], no baseline to compare.   CrCl= 44 ml/min actual body weight  Plan:  - Will initiate rivaroxaban at lowered dose of 15 mg daily due to ARF on CKD.  - Monitor SCr, CBC, signs of bleding - Resume home dose as appropriate  Ulice Dash D 11/13/2018,6:56 PM

## 2018-11-18 NOTE — H&P (Addendum)
TRH H&P   Patient Demographics:    [name] [name], is a 68 y.o. female  MRN: [id number]   DOB - 1950-06-05  Admit Date - 10/29/2018  Outpatient Primary MD for the patient is [name] Johns, MD  Patient coming from: [facility] ER  CC notes of breath   HPI:    [name] [name]  is a 68 y.o. female, with history of morbid obesity, DM type II, COPD not on oxygen, hypertension, CKD 3 baseline creatinine around 1.4, chronic atrial fibrillation Mal[place] vas 2 score of at least 3 on Xarelto and Cardizem, spine surgeries in the past, chronic fluid retention, history of Graves' disease now hypothyroid, history of CVA, history of DVT who lives at [name] and was likely exposed to COVID-19 infection about 10 to 14 days ago.    She was having low-grade fevers body aches and fatigue for several days and started developing some shortness of breath around 4 to 5 days ago, yesterday she presented herself to [facility] ER for shortness of breath and was diagnosed with COVID-19 pneumonitis causing acute hypoxic respiratory failure, ARF, elevated CRP and ferritin.  She was then transferred here for further care, came about 20 hours later due to lack of transportation.  Patient currently has shortness of breath at rest, requiring about 15 L nasal cannula oxygen, some fatigue, no other subjective complaints at this time.    Review of systems:    A full 10 point Review of Systems was done, except as stated above, all other Review of Systems were negative.   With Past History of the following :    Past Medical History:  Diagnosis Date  . Arthritis   . Carpal tunnel syndrome   . Cataract   . Depression   . Esophagitis   . GERD  (gastroesophageal reflux disease)   . Graves disease   . History of DVT (deep vein thrombosis)   . Hypertension   . Hypothyroidism   . MI (myocardial infarction) ([place])   . Mood disorder ([place])   . Peptic ulcer disease   . Pre-diabetes   . Stroke ([place])   . Thyroid disease   . Venous insufficiency   . Vitamin D deficiency       Past Surgical History:  Procedure Laterality Date  . COLONOSCOPY  01/19/2015   Colonic polyp status  post polypectomy. Limited examination of the right side of the colon. Mild sigmoid diverticulosis. Tubular adenoma.   [facility] LEFT HEART CATH AND CORONARY ANGIOGRAPHY N/A 10/25/2016   Procedure: Left Heart Cath and Coronary Angiography;  Surgeon: [name] Crome, MD;  Location: [place] [facility];  Service: Cardiovascular;  Laterality: N/A;  . LUMBAR LAMINECTOMY/DECOMPRESSION MICRODISCECTOMY N/A 10/23/2016   Procedure: LUMBAR LAMINECTOMY/DECOMPRESSION MICRODISCECTOMY;  Surgeon: [name] Maudlin, MD;  Location: [facility];  Service: Orthopedics;  Laterality: N/A;  . TUBAL LIGATION    . ULTRASOUND GUIDANCE FOR VASCULAR ACCESS  10/25/2016   Procedure: Ultrasound Guidance For Vascular Access;  Surgeon: [name] Crome, MD;  Location: [place] [facility];  Service: Cardiovascular;;      Social History:     Social History   Tobacco Use  . Smoking status: Never Smoker  . Smokeless tobacco: Never Used  Substance Use Topics  . Alcohol use: No         Family History :     Family History  Problem Relation Age of Onset  . Hypertension Mother   . Hypertension Father   . Colon cancer Neg Hx   . Esophageal cancer Neg Hx   . Rectal cancer Neg Hx   . Stomach cancer Neg Hx        Home Medications:   Prior to Admission medications   Medication Sig Start Date End Date Taking? Authorizing Provider  albuterol (PROVENTIL HFA;VENTOLIN HFA) 108 (90 BASE) MCG/ACT inhaler Inhale 2 puffs into the lungs every 6 (six) hours as needed for wheezing or shortness of breath. Patient not  taking: Reported on 03/09/2018 06/21/14   Reyne Dumas, MD  aspirin EC 81 MG tablet Take 81 mg by mouth daily.    [provider]  budesonide-formoterol (SYMBICORT) 160-4.5 MCG/ACT inhaler Inhale 2 puffs into the lungs 2 (two) times daily.    [provider]  cholecalciferol (VITAMIN D) 1000 UNITS tablet Take 2,000 Units by mouth daily.    [provider]  diltiazem (CARDIZEM CD) 120 MG 24 hr capsule TAKE 1 CAPSULE BY MOUTH DAILY. 03/04/18   Revankar, Reita Cliche, MD  docusate sodium (COLACE) 100 MG capsule Take 200 mg by mouth 2 (two) times daily as needed for mild constipation.    [provider]  ferrous sulfate 325 (65 FE) MG EC tablet Take 325 mg by mouth 3 (three) times daily with meals.    [provider]  fluticasone (FLONASE) 50 MCG/ACT nasal spray Place 2 sprays into both nostrils daily as needed for allergies or rhinitis.    [provider]  gabapentin (NEURONTIN) 300 MG capsule Take 300 mg by mouth 3 (three) times daily.    [provider]  hydrochlorothiazide (MICROZIDE) 12.5 MG capsule daily. 09/15/18   [provider]  levothyroxine (SYNTHROID, LEVOTHROID) 112 MCG tablet Take 112 mcg by mouth daily before breakfast.    [provider]  loratadine (CLARITIN) 10 MG tablet Take 10 mg by mouth daily as needed for allergies.    [provider]  losartan (COZAAR) 50 MG tablet Take 50 mg by mouth daily.    [provider]  losartan-hydrochlorothiazide (HYZAAR) 100-25 MG per tablet Take 1 tablet by mouth daily. Patient not taking: Reported on 06/08/2018 06/27/14   Reyne Dumas, MD  montelukast (SINGULAIR) 10 MG tablet Take 10 mg by mouth at bedtime.    [provider]  omega-3 acid ethyl esters (LOVAZA) 1 G capsule Take 4 g by mouth daily.  [provider]  omeprazole (PRILOSEC) 20 MG capsule Take 20 mg by mouth daily.    [provider]  potassium chloride SA  (K-DUR,KLOR-CON) 20 MEQ tablet Take 100-120 mEq by mouth See admin instructions. Pt to take 6 tablets in the morning, and 5 tablets in the evening    [provider]  spironolactone (ALDACTONE) 25 MG tablet Take 25 mg by mouth daily.    [provider]  tiotropium (SPIRIVA) 18 MCG inhalation capsule Place 18 mcg into inhaler and inhale daily.    [provider]  venlafaxine XR (EFFEXOR-XR) 75 MG 24 hr capsule Take 75 mg by mouth daily with breakfast.    [provider]  XARELTO 20 MG TABS tablet TAKE 1 TABLET(20 MG) BY MOUTH DAILY 09/18/18   Revankar, Reita Cliche, MD     Allergies:     Allergies  Allergen Reactions  . Sulfonamide Derivatives Itching     Physical Exam:   Vitals  Blood pressure 94/81, pulse (!) 101, temperature 98 F (36.7 C), temperature source Oral, resp. rate (!) 27, SpO2 (!) 89 %.   1. General middle-aged obese African-American female sitting up in hospital bed in mild  respiratory distress on 15 L nasal cannula oxygen,  2. Normal affect and insight, Not Suicidal or Homicidal, Awake Alert, Oriented X 3.  3. No F.N deficits, ALL C.Nerves Intact, Strength 5/5 all 4 extremities, Sensation intact all 4 extremities, Plantars down going.  4. Ears and Eyes appear Normal, Conjunctivae clear, PERRLA. Moist Oral Mucosa.  5. Supple Neck, No JVD, No cervical lymphadenopathy appriciated, No Carotid Bruits.  6. Symmetrical Chest wall movement, Good air movement bilaterally, CTAB.  7. RRR, No Gallops, Rubs or Murmurs, No Parasternal Heave.  8. Positive Bowel Sounds, Abdomen Soft, No tenderness, No organomegaly appriciated,No rebound -guarding or rigidity.  9.  No Cyanosis, she does have 2+ pitting edema in legs which is chronic and stable for her mostly in her right leg, chronic erythema in the left lower extremities attributes that to previous leg surgeries,, No Skin Rash or Bruise.  10. Good muscle tone,  joints appear normal , no  effusions, Normal ROM.  11. No Palpable Lymph Nodes in Neck or Axillae      Data Review:    CBC No results for input(s): WBC, HGB, HCT, PLT, MCV, MCH, MCHC, RDW, LYMPHSABS, MONOABS, EOSABS, BASOSABS, BANDABS in the last 168 hours.  Invalid input(s): NEUTRABS, BANDSABD ------------------------------------------------------------------------------------------------------------------  Chemistries  No results for input(s): NA, K, CL, CO2, GLUCOSE, BUN, CREATININE, CALCIUM, MG, AST, ALT, ALKPHOS, BILITOT in the last 168 hours.  Invalid input(s): GFRCGP ------------------------------------------------------------------------------------------------------------------ CrCl cannot be calculated (Patient's most recent lab result is older than the maximum 21 days allowed.). ------------------------------------------------------------------------------------------------------------------ No results for input(s): TSH, T4TOTAL, T3FREE, THYROIDAB in the last 72 hours.  Invalid input(s): FREET3  Coagulation profile No results for input(s): INR, PROTIME in the last 168 hours. ------------------------------------------------------------------------------------------------------------------- No results for input(s): DDIMER in the last 72 hours. -------------------------------------------------------------------------------------------------------------------  Cardiac Enzymes No results for input(s): CKMB, TROPONINI, MYOGLOBIN in the last 168 hours.  Invalid input(s): CK ------------------------------------------------------------------------------------------------------------------ No results found for: BNP   ---------------------------------------------------------------------------------------------------------------  Urinalysis    Component Value Date/Time   COLORURINE YELLOW 06/20/2014 0442   APPEARANCEUR CLOUDY (A) 06/20/2014 0442   LABSPEC 1.040 (H) 06/20/2014 0442   PHURINE 6.0  06/20/2014 0442   GLUCOSEU NEGATIVE 06/20/2014 0442   HGBUR NEGATIVE 06/20/2014 [place] NEGATIVE 06/20/2014 0442   KETONESUR NEGATIVE 06/20/2014 0442  PROTEINUR NEGATIVE 06/20/2014 0442   UROBILINOGEN 1.0 06/20/2014 0442   NITRITE NEGATIVE 06/20/2014 0442   LEUKOCYTESUR MODERATE (A) 06/20/2014 0442    ----------------------------------------------------------------------------------------------------------------   Imaging Results:    No results found.  Baseline EKG and labs here ordered   Assessment & Plan:      1.  Acute hypoxic respiratory failure due to COVID-19 pneumonitis.  Unfortunately patient has been short of breath for at least last 4 to 5 days and sick for at least 1 week.  She presented to the ER yesterday and received 1 dose of IV Decadron, at this time she appears to be quite hypoxic, continue 15 L nasal cannula oxygen supplementation, will place her on combination of IV steroids, REMDESIVIR, Actemra, prone at night, sit up in the daytime and use flutter valve for pulmonary toiletry.  Continue supportive care and monitor inflammatory markers closely.  Note off label use of Actemra was clearly explained to the patient, she denies any history of hepatitis or tuberculosis.  Understands risks and benefits and wants to proceed with taking the medication.   2.  ARF on CKD 3.  Baseline creatinine is around 1.4.  Creatinine at [facility] was 1.8, hold diuretic and hydrate, she does have chronic pitting edema in left lower extremity but her lungs are relatively spared, monitor closely.  Will order 15 hours of normal saline there after readdress IV fluids.  3.  Chronic atrial fibrillation.  Mali vas 2 score of at least 3.  Continue Cardizem p.o. along with Xarelto to be dosed by pharmacy, PRN IV Cardizem for rate control, telemetry monitor.  Baseline EKG will be ordered.  4.  COPD.  Currently at baseline.  No wheezing.  Not on home oxygen.  5.  Hypothyroidism.   On Synthroid.  6.  History of CVA.  No acute issues.  Currently noted to be on combination of Xarelto and aspirin which will be continued, will add statin for secondary prevention especially since she is diabetic, will defer to primary physicians upon the need of aspirin and Xarelto combination.  7.  Vitamin D deficiency.  Continue supplementation at home dose.  8.  History of chronic iron deficiency anemia.  No acute issues.  9.  History of DVT.  On Xarelto.    10.  Spine surgeries in the past with chronically elevated right hemidiaphragm.  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  Stable.    11. DM type II.  Check A1c, placed on sliding scale and monitor.  Sugars might run high as she will be placed on steroids, may require glucose stabilizer.      DVT Prophylaxis Xarelto  AM Labs Ordered, also please review Full Orders  Family Communication: Admission, patients condition and plan of care including tests being ordered have been discussed with the patient   who indicates understanding and agree with the plan and Code Status.  Code Status full  Likely DC to home in 4 to 5 days  Condition GUARDED    Consults called: None  Admission status: Inpatient  Time spent in minutes : 40 minutes   Lala Lund M.D on 11/21/2018 at 6:15 PM  To page go to [email or web address] - password [email or web address]

## 2018-11-19 ENCOUNTER — Other Ambulatory Visit: Payer: Self-pay

## 2018-11-19 ENCOUNTER — Inpatient Hospital Stay (HOSPITAL_COMMUNITY): Payer: Medicare Other

## 2018-11-19 ENCOUNTER — Encounter (HOSPITAL_COMMUNITY): Payer: Self-pay

## 2018-11-19 DIAGNOSIS — E1165 Type 2 diabetes mellitus with hyperglycemia: Secondary | ICD-10-CM

## 2018-11-19 DIAGNOSIS — N183 Chronic kidney disease, stage 3 (moderate): Secondary | ICD-10-CM

## 2018-11-19 DIAGNOSIS — J45909 Unspecified asthma, uncomplicated: Secondary | ICD-10-CM

## 2018-11-19 DIAGNOSIS — J069 Acute upper respiratory infection, unspecified: Secondary | ICD-10-CM

## 2018-11-19 DIAGNOSIS — R0602 Shortness of breath: Secondary | ICD-10-CM

## 2018-11-19 DIAGNOSIS — U071 COVID-19: Principal | ICD-10-CM

## 2018-11-19 DIAGNOSIS — I4821 Permanent atrial fibrillation: Secondary | ICD-10-CM

## 2018-11-19 LAB — POCT I-STAT 7, (LYTES, BLD GAS, ICA,H+H)
Acid-base deficit: 3 mmol/L — ABNORMAL HIGH (ref 0.0–2.0)
Bicarbonate: 27.4 mmol/L (ref 20.0–28.0)
Calcium, Ion: 1.31 mmol/L (ref 1.15–1.40)
HCT: 41 % (ref 36.0–46.0)
Hemoglobin: 13.9 g/dL (ref 12.0–15.0)
O2 Saturation: 97 %
Patient temperature: 98.1
Potassium: 5 mmol/L (ref 3.5–5.1)
Sodium: 136 mmol/L (ref 135–145)
TCO2: 30 mmol/L (ref 22–32)
pCO2 arterial: 72.4 mmHg (ref 32.0–48.0)
pH, Arterial: 7.185 — CL (ref 7.350–7.450)
pO2, Arterial: 121 mmHg — ABNORMAL HIGH (ref 83.0–108.0)

## 2018-11-19 LAB — GLUCOSE, CAPILLARY
Glucose-Capillary: 210 mg/dL — ABNORMAL HIGH (ref 70–99)
Glucose-Capillary: 299 mg/dL — ABNORMAL HIGH (ref 70–99)
Glucose-Capillary: 213 mg/dL — ABNORMAL HIGH (ref 70–99)
Glucose-Capillary: 283 mg/dL — ABNORMAL HIGH (ref 70–99)

## 2018-11-19 LAB — CBC WITH DIFFERENTIAL/PLATELET
Abs Immature Granulocytes: 0.27 10*3/uL — ABNORMAL HIGH (ref 0.00–0.07)
Basophils Absolute: 0 10*3/uL (ref 0.0–0.1)
Basophils Relative: 0 %
Eosinophils Absolute: 0 10*3/uL (ref 0.0–0.5)
Eosinophils Relative: 0 %
HCT: 39.6 % (ref 36.0–46.0)
Hemoglobin: 12.6 g/dL (ref 12.0–15.0)
Immature Granulocytes: 3 %
Lymphocytes Relative: 5 %
Lymphs Abs: 0.5 10*3/uL — ABNORMAL LOW (ref 0.7–4.0)
MCH: 27.4 pg (ref 26.0–34.0)
MCHC: 31.8 g/dL (ref 30.0–36.0)
MCV: 86.1 fL (ref 80.0–100.0)
Monocytes Absolute: 0.5 10*3/uL (ref 0.1–1.0)
Monocytes Relative: 5 %
Neutro Abs: 8.8 10*3/uL — ABNORMAL HIGH (ref 1.7–7.7)
Neutrophils Relative %: 87 %
Platelets: 136 10*3/uL — ABNORMAL LOW (ref 150–400)
RBC: 4.6 MIL/uL (ref 3.87–5.11)
RDW: 13 % (ref 11.5–15.5)
WBC: 10.1 10*3/uL (ref 4.0–10.5)
nRBC: 0.2 % (ref 0.0–0.2)

## 2018-11-19 LAB — COMPREHENSIVE METABOLIC PANEL
ALT: 30 U/L (ref 0–44)
ALT: 44 U/L (ref 0–44)
AST: 49 U/L — ABNORMAL HIGH (ref 15–41)
AST: 71 U/L — ABNORMAL HIGH (ref 15–41)
Albumin: 2.8 g/dL — ABNORMAL LOW (ref 3.5–5.0)
Albumin: 2.7 g/dL — ABNORMAL LOW (ref 3.5–5.0)
Alkaline Phosphatase: 80 U/L (ref 38–126)
Alkaline Phosphatase: 101 U/L (ref 38–126)
Anion gap: 10 (ref 5–15)
Anion gap: 13 (ref 5–15)
BUN: 33 mg/dL — ABNORMAL HIGH (ref 8–23)
BUN: 34 mg/dL — ABNORMAL HIGH (ref 8–23)
CO2: 24 mmol/L (ref 22–32)
CO2: 23 mmol/L (ref 22–32)
Calcium: 8.7 mg/dL — ABNORMAL LOW (ref 8.9–10.3)
Calcium: 8.8 mg/dL — ABNORMAL LOW (ref 8.9–10.3)
Chloride: 102 mmol/L (ref 98–111)
Chloride: 101 mmol/L (ref 98–111)
Creatinine, Ser: 1.29 mg/dL — ABNORMAL HIGH (ref 0.44–1.00)
Creatinine, Ser: 1.24 mg/dL — ABNORMAL HIGH (ref 0.44–1.00)
GFR calc Af Amer: 49 mL/min — ABNORMAL LOW (ref >60)
GFR calc Af Amer: 52 mL/min — ABNORMAL LOW (ref >60)
GFR calc non Af Amer: 43 mL/min — ABNORMAL LOW (ref >60)
GFR calc non Af Amer: 45 mL/min — ABNORMAL LOW (ref >60)
Glucose, Bld: 223 mg/dL — ABNORMAL HIGH (ref 70–99)
Glucose, Bld: 274 mg/dL — ABNORMAL HIGH (ref 70–99)
Potassium: 5 mmol/L (ref 3.5–5.1)
Potassium: 5.4 mmol/L — ABNORMAL HIGH (ref 3.5–5.1)
Sodium: 136 mmol/L (ref 135–145)
Sodium: 137 mmol/L (ref 135–145)
Total Bilirubin: 0.7 mg/dL (ref 0.3–1.2)
Total Bilirubin: 0.8 mg/dL (ref 0.3–1.2)
Total Protein: 7.1 g/dL (ref 6.5–8.1)
Total Protein: 7.2 g/dL (ref 6.5–8.1)

## 2018-11-19 LAB — LACTATE DEHYDROGENASE: LDH: 441 U/L — ABNORMAL HIGH (ref 98–192)

## 2018-11-19 LAB — MRSA PCR SCREENING: MRSA by PCR: NEGATIVE

## 2018-11-19 LAB — HIV ANTIBODY (ROUTINE TESTING W REFLEX): HIV Screen 4th Generation wRfx: NONREACTIVE

## 2018-11-19 LAB — MAGNESIUM: Magnesium: 2.2 mg/dL (ref 1.7–2.4)

## 2018-11-19 LAB — SARS CORONAVIRUS 2 BY RT PCR (HOSPITAL ORDER, PERFORMED IN ~~LOC~~ HOSPITAL LAB): SARS Coronavirus 2: POSITIVE — AB

## 2018-11-19 LAB — BRAIN NATRIURETIC PEPTIDE: B Natriuretic Peptide: 57.8 pg/mL (ref 0.0–100.0)

## 2018-11-19 LAB — D-DIMER, QUANTITATIVE: D-Dimer, Quant: 0.52 ug/mL-FEU — ABNORMAL HIGH (ref 0.00–0.50)

## 2018-11-19 LAB — PHOSPHORUS: Phosphorus: 5.5 mg/dL — ABNORMAL HIGH (ref 2.5–4.6)

## 2018-11-19 LAB — C-REACTIVE PROTEIN: CRP: 16.2 mg/dL — ABNORMAL HIGH (ref <1.0)

## 2018-11-19 LAB — FERRITIN: Ferritin: 1461 ng/mL — ABNORMAL HIGH (ref 11–307)

## 2018-11-19 MED ORDER — VITAMIN C 500 MG PO TABS
500.0000 mg | ORAL_TABLET | Freq: Every day | ORAL | Status: DC
  Administered 2018-11-19 – 2018-11-30 (×12): 500 mg via ORAL
  Filled 2018-11-19 (×12): qty 1

## 2018-11-19 MED ORDER — MIDAZOLAM 50MG/50ML (1MG/ML) PREMIX INFUSION
0.5000 mg/h | INTRAVENOUS | Status: DC
  Administered 2018-11-20: 06:00:00 3.5 mg/h via INTRAVENOUS
  Administered 2018-11-22 – 2018-11-25 (×9): 8 mg/h via INTRAVENOUS
  Filled 2018-11-19 (×11): qty 50
  Filled 2018-11-19: qty 150

## 2018-11-19 MED ORDER — NOREPINEPHRINE 16 MG/250ML-% IV SOLN
0.0000 ug/min | INTRAVENOUS | Status: DC
  Administered 2018-11-20: 2 ug/min via INTRAVENOUS
  Administered 2018-11-22: 10:00:00 4 ug/min via INTRAVENOUS
  Filled 2018-11-19 (×3): qty 250

## 2018-11-19 MED ORDER — SENNOSIDES 8.8 MG/5ML PO SYRP: 5.0000 mL | ORAL_SOLUTION | Freq: Two times a day (BID) | ORAL | Status: DC | PRN

## 2018-11-19 MED ORDER — ROCURONIUM BROMIDE 10 MG/ML (PF) SYRINGE
PREFILLED_SYRINGE | INTRAVENOUS | Status: AC
  Filled 2018-11-19: qty 10

## 2018-11-19 MED ORDER — FENTANYL 2500MCG IN NS 250ML (10MCG/ML) PREMIX INFUSION
0.0000 ug/h | INTRAVENOUS | Status: DC
  Administered 2018-11-19: 200 ug/h via INTRAVENOUS
  Administered 2018-11-19: 19:00:00 50 ug/h via INTRAVENOUS
  Filled 2018-11-19 (×3): qty 250

## 2018-11-19 MED ORDER — MIDAZOLAM HCL 2 MG/2ML IJ SOLN
4.0000 mg | Freq: Once | INTRAMUSCULAR | Status: AC
  Administered 2018-11-19: 2 mg via INTRAVENOUS

## 2018-11-19 MED ORDER — BISACODYL 10 MG RE SUPP: 10.0000 mg | Freq: Every day | RECTAL | Status: DC | PRN

## 2018-11-19 MED ORDER — AMIODARONE HCL IN DEXTROSE 360-4.14 MG/200ML-% IV SOLN
30.0000 mg/h | INTRAVENOUS | Status: DC
  Administered 2018-11-21: 01:00:00 30 mg/h via INTRAVENOUS
  Filled 2018-11-19 (×3): qty 200

## 2018-11-19 MED ORDER — RIVAROXABAN 20 MG PO TABS
20.0000 mg | ORAL_TABLET | Freq: Every day | ORAL | Status: DC
  Administered 2018-11-19 – 2018-11-22 (×4): 20 mg via ORAL
  Filled 2018-11-19 (×6): qty 1

## 2018-11-19 MED ORDER — FENTANYL CITRATE (PF) 100 MCG/2ML IJ SOLN
100.0000 ug | Freq: Once | INTRAMUSCULAR | Status: AC
  Administered 2018-11-19: 50 ug via INTRAVENOUS

## 2018-11-19 MED ORDER — NOREPINEPHRINE 4 MG/250ML-% IV SOLN
INTRAVENOUS | Status: AC
  Filled 2018-11-19: qty 250

## 2018-11-19 MED ORDER — MAGNESIUM SULFATE 2 GM/50ML IV SOLN
2.0000 g | Freq: Once | INTRAVENOUS | Status: DC
  Filled 2018-11-19: qty 50

## 2018-11-19 MED ORDER — ETOMIDATE 2 MG/ML IV SOLN
INTRAVENOUS | Status: AC
  Filled 2018-11-19: qty 10

## 2018-11-19 MED ORDER — FLUTICASONE FUROATE-VILANTEROL 100-25 MCG/INH IN AEPB
1.0000 | INHALATION_SPRAY | Freq: Every day | RESPIRATORY_TRACT | Status: DC
  Filled 2018-11-19: qty 28

## 2018-11-19 MED ORDER — TOCILIZUMAB 400 MG/20ML IV SOLN
800.0000 mg | Freq: Once | INTRAVENOUS | Status: AC
  Administered 2018-11-19: 800 mg via INTRAVENOUS
  Filled 2018-11-19: qty 40

## 2018-11-19 MED ORDER — DEXMEDETOMIDINE HCL IN NACL 400 MCG/100ML IV SOLN
0.0000 ug/kg/h | INTRAVENOUS | Status: DC
  Administered 2018-11-19: 1.2 ug/kg/h via INTRAVENOUS
  Administered 2018-11-19: 0.5 ug/kg/h via INTRAVENOUS
  Administered 2018-11-20: 1.2 ug/kg/h via INTRAVENOUS
  Filled 2018-11-19 (×3): qty 100

## 2018-11-19 MED ORDER — CHLORHEXIDINE GLUCONATE 0.12% ORAL RINSE (MEDLINE KIT)
15.0000 mL | Freq: Two times a day (BID) | OROMUCOSAL | Status: DC
  Administered 2018-11-19 – 2018-11-30 (×22): 15 mL via OROMUCOSAL

## 2018-11-19 MED ORDER — MIDAZOLAM 50MG/50ML (1MG/ML) PREMIX INFUSION
0.5000 mg/h | INTRAVENOUS | Status: DC
  Administered 2018-11-19 (×2): 0.5 mg/h via INTRAVENOUS
  Filled 2018-11-19: qty 50

## 2018-11-19 MED ORDER — ROCURONIUM BROMIDE 10 MG/ML (PF) SYRINGE
80.0000 mg | PREFILLED_SYRINGE | Freq: Once | INTRAVENOUS | Status: AC
  Administered 2018-11-19: 80 mg via INTRAVENOUS

## 2018-11-19 MED ORDER — VITAL HIGH PROTEIN PO LIQD: 1000.0000 mL | ORAL | Status: DC

## 2018-11-19 MED ORDER — ORAL CARE MOUTH RINSE
15.0000 mL | OROMUCOSAL | Status: DC
  Administered 2018-11-20 (×4): 15 mL via OROMUCOSAL

## 2018-11-19 MED ORDER — MIDAZOLAM HCL 2 MG/2ML IJ SOLN
2.0000 mg | Freq: Once | INTRAMUSCULAR | Status: AC
  Administered 2018-11-19: 2 mg via INTRAVENOUS

## 2018-11-19 MED ORDER — INSULIN DETEMIR 100 UNIT/ML ~~LOC~~ SOLN
0.0750 [IU]/kg | Freq: Two times a day (BID) | SUBCUTANEOUS | Status: DC
  Administered 2018-11-19 – 2018-11-20 (×3): 8 [IU] via SUBCUTANEOUS
  Filled 2018-11-19 (×3): qty 0.08

## 2018-11-19 MED ORDER — FENTANYL CITRATE (PF) 100 MCG/2ML IJ SOLN
50.0000 ug | Freq: Once | INTRAMUSCULAR | Status: AC
  Administered 2018-11-19: 50 ug via INTRAVENOUS

## 2018-11-19 MED ORDER — INSULIN ASPART 100 UNIT/ML ~~LOC~~ SOLN
0.0000 [IU] | SUBCUTANEOUS | Status: DC
  Administered 2018-11-19: 17:00:00 7 [IU] via SUBCUTANEOUS
  Administered 2018-11-19 (×2): 11 [IU] via SUBCUTANEOUS
  Administered 2018-11-20: 16:00:00 4 [IU] via SUBCUTANEOUS
  Administered 2018-11-20 (×4): 7 [IU] via SUBCUTANEOUS
  Administered 2018-11-21 (×2): 11 [IU] via SUBCUTANEOUS
  Administered 2018-11-21 (×3): 7 [IU] via SUBCUTANEOUS
  Administered 2018-11-21 – 2018-11-22 (×2): 11 [IU] via SUBCUTANEOUS
  Administered 2018-11-22: 17:00:00 15 [IU] via SUBCUTANEOUS
  Administered 2018-11-22 (×2): 11 [IU] via SUBCUTANEOUS
  Administered 2018-11-22: 21:00:00 15 [IU] via SUBCUTANEOUS
  Administered 2018-11-22: 7 [IU] via SUBCUTANEOUS
  Administered 2018-11-23: 05:00:00 11 [IU] via SUBCUTANEOUS
  Administered 2018-11-23: 20:00:00 15 [IU] via SUBCUTANEOUS
  Administered 2018-11-23: 20 [IU] via SUBCUTANEOUS
  Administered 2018-11-23: 08:00:00 7 [IU] via SUBCUTANEOUS
  Administered 2018-11-23: 15 [IU] via SUBCUTANEOUS
  Administered 2018-11-23: 13:00:00 7 [IU] via SUBCUTANEOUS
  Administered 2018-11-24: 11 [IU] via SUBCUTANEOUS
  Administered 2018-11-24: 16:00:00 20 [IU] via SUBCUTANEOUS
  Administered 2018-11-24: 08:00:00 11 [IU] via SUBCUTANEOUS
  Administered 2018-11-24: 19 [IU] via SUBCUTANEOUS
  Administered 2018-11-24 (×3): 15 [IU] via SUBCUTANEOUS
  Administered 2018-11-25: 7 [IU] via SUBCUTANEOUS
  Administered 2018-11-25 (×2): 11 [IU] via SUBCUTANEOUS
  Administered 2018-11-25: 15 [IU] via SUBCUTANEOUS
  Administered 2018-11-25 (×2): 11 [IU] via SUBCUTANEOUS
  Administered 2018-11-26 (×2): 7 [IU] via SUBCUTANEOUS
  Administered 2018-11-26: 20:00:00 4 [IU] via SUBCUTANEOUS
  Administered 2018-11-26: 08:00:00 11 [IU] via SUBCUTANEOUS
  Administered 2018-11-26: 17:00:00 7 [IU] via SUBCUTANEOUS
  Administered 2018-11-26: 04:00:00 11 [IU] via SUBCUTANEOUS
  Administered 2018-11-27 (×2): 7 [IU] via SUBCUTANEOUS
  Administered 2018-11-27: 21:00:00 15 [IU] via SUBCUTANEOUS
  Administered 2018-11-27: 08:00:00 4 [IU] via SUBCUTANEOUS
  Administered 2018-11-27: 11 [IU] via SUBCUTANEOUS
  Administered 2018-11-27: 15 [IU] via SUBCUTANEOUS
  Administered 2018-11-28 (×2): 4 [IU] via SUBCUTANEOUS
  Administered 2018-11-28 (×2): 11 [IU] via SUBCUTANEOUS
  Administered 2018-11-28: 7 [IU] via SUBCUTANEOUS
  Administered 2018-11-29: 6 [IU] via SUBCUTANEOUS
  Administered 2018-11-29 (×3): 4 [IU] via SUBCUTANEOUS
  Administered 2018-11-29: 7 [IU] via SUBCUTANEOUS
  Administered 2018-11-29 – 2018-11-30 (×2): 4 [IU] via SUBCUTANEOUS
  Administered 2018-11-30 (×5): 7 [IU] via SUBCUTANEOUS
  Administered 2018-12-01 (×3): 4 [IU] via SUBCUTANEOUS
  Administered 2018-12-01: 7 [IU] via SUBCUTANEOUS
  Administered 2018-12-01 (×2): 4 [IU] via SUBCUTANEOUS
  Administered 2018-12-02: 14 [IU] via SUBCUTANEOUS
  Administered 2018-12-02: 4 [IU] via SUBCUTANEOUS
  Administered 2018-12-02: 8 [IU] via SUBCUTANEOUS
  Administered 2018-12-02 – 2018-12-03 (×2): 3 [IU] via SUBCUTANEOUS
  Administered 2018-12-03 (×2): 4 [IU] via SUBCUTANEOUS
  Administered 2018-12-03: 3 [IU] via SUBCUTANEOUS
  Administered 2018-12-03: 04:00:00 4 [IU] via SUBCUTANEOUS
  Administered 2018-12-04 (×2): 3 [IU] via SUBCUTANEOUS
  Administered 2018-12-04: 4 [IU] via SUBCUTANEOUS
  Administered 2018-12-04: 3 [IU] via SUBCUTANEOUS
  Administered 2018-12-05 (×2): 4 [IU] via SUBCUTANEOUS
  Administered 2018-12-06: 3 [IU] via SUBCUTANEOUS
  Administered 2018-12-06: 7 [IU] via SUBCUTANEOUS
  Administered 2018-12-06 – 2018-12-07 (×2): 4 [IU] via SUBCUTANEOUS

## 2018-11-19 MED ORDER — AMIODARONE HCL IN DEXTROSE 360-4.14 MG/200ML-% IV SOLN
60.0000 mg/h | INTRAVENOUS | Status: AC
  Administered 2018-11-19 (×2): 60 mg/h via INTRAVENOUS
  Filled 2018-11-19: qty 200

## 2018-11-19 MED ORDER — MIDAZOLAM HCL 2 MG/2ML IJ SOLN
INTRAMUSCULAR | Status: AC
  Filled 2018-11-19: qty 4

## 2018-11-19 MED ORDER — PRO-STAT SUGAR FREE PO LIQD
30.0000 mL | Freq: Two times a day (BID) | ORAL | Status: DC
  Administered 2018-11-20 (×2): 30 mL
  Filled 2018-11-19 (×2): qty 30

## 2018-11-19 MED ORDER — ZINC SULFATE 220 (50 ZN) MG PO CAPS
220.0000 mg | ORAL_CAPSULE | Freq: Every day | ORAL | Status: DC
  Administered 2018-11-19 – 2018-11-30 (×12): 220 mg via ORAL
  Filled 2018-11-19 (×12): qty 1

## 2018-11-19 MED ORDER — FENTANYL BOLUS VIA INFUSION
25.0000 ug | INTRAVENOUS | Status: DC | PRN
  Filled 2018-11-19: qty 25

## 2018-11-19 MED ORDER — AMIODARONE LOAD VIA INFUSION
150.0000 mg | Freq: Once | INTRAVENOUS | Status: AC
  Administered 2018-11-19: 150 mg via INTRAVENOUS
  Filled 2018-11-19: qty 83.34

## 2018-11-19 MED ORDER — ETOMIDATE 2 MG/ML IV SOLN
20.0000 mg | Freq: Once | INTRAVENOUS | Status: AC
  Administered 2018-11-19: 20 mg via INTRAVENOUS

## 2018-11-19 MED ORDER — MIDAZOLAM HCL 2 MG/2ML IJ SOLN
INTRAMUSCULAR | Status: AC
  Filled 2018-11-19: qty 2

## 2018-11-19 MED ORDER — NOREPINEPHRINE 4 MG/250ML-% IV SOLN: 0.0000 ug/min | INTRAVENOUS | Status: DC

## 2018-11-19 MED ORDER — FENTANYL CITRATE (PF) 100 MCG/2ML IJ SOLN
INTRAMUSCULAR | Status: AC
  Filled 2018-11-19: qty 2

## 2018-11-19 MED ORDER — MIDAZOLAM HCL 2 MG/2ML IJ SOLN
2.0000 mg | INTRAMUSCULAR | Status: DC | PRN
  Administered 2018-11-19: 2 mg via INTRAVENOUS
  Administered 2018-11-23: 3 mg via INTRAVENOUS

## 2018-11-19 MED ORDER — FENTANYL CITRATE (PF) 100 MCG/2ML IJ SOLN
25.0000 ug | Freq: Once | INTRAMUSCULAR | Status: AC
  Administered 2018-11-19: 25 ug via INTRAVENOUS
  Filled 2018-11-19: qty 2

## 2018-11-19 NOTE — Procedures (Signed)
Central Venous Catheter Insertion Procedure Note [name] [name] [id number] December 28, 1950  Procedure: Insertion of Central Venous Catheter Indications: Assessment of intravascular volume  Procedure Details Consent: Risks of procedure as well as the alternatives and risks of each were explained to the (patient/caregiver).  Consent for procedure obtained. Time Out: Verified patient identification, verified procedure, site/side was marked, verified correct patient position, special equipment/implants available, medications/allergies/relevent history reviewed, required imaging and test results available.  Performed  Maximum sterile technique was used including antiseptics, cap, gloves, gown, hand hygiene, mask and sheet. Skin prep: Chlorhexidine; local anesthetic administered A antimicrobial bonded/coated triple lumen catheter was placed in the left internal jugular vein using the Seldinger technique.  Ultrasound was used to verify the patency of the vein and for real time needle guidance.  Evaluation Blood flow good Complications: No apparent complications Patient did tolerate procedure well. Chest X-ray ordered to verify placement.  CXR: pending.  Simonne Maffucci 11/19/2018, 6:25 PM

## 2018-11-19 NOTE — Progress Notes (Signed)
Pt desatted to low 70's while turning from prone position to supine position this AM. After 10 min rest she only was in the mid 80's. Pt then requested bedpan. O2 decreased again to low 70's. NRB applied and FiO2 on HHFNC increased as well. Pt bathed and sats remained low 90's while on NRB and HHFNC at 25L and 80%. Will remove NRB after patient rests. She is a "mouth breather" and says she will like mask back when proned.  Pt remained proned from 1900-2030 and 2300-0415 last night

## 2018-11-19 NOTE — Procedures (Signed)
Intubation Procedure Note [name] [name] [id number] 1950-07-31  Procedure: Intubation Indications: Respiratory insufficiency  Procedure Details Consent: Risks of procedure as well as the alternatives and risks of each were explained to the (patient/caregiver).  Consent for procedure obtained. Time Out: Verified patient identification, verified procedure, site/side was marked, verified correct patient position, special equipment/implants available, medications/allergies/relevent history reviewed, required imaging and test results available.  Performed  Drugs versed 33m, Fentanyl 563m Etomidate 2054mocuronium 73m73m x 1 with MAC 4 blade Grade 1 view 7.5 ET tube passed through cords under direct visualization Placement confirmed with bilateral breath sounds, positive EtCO2 change and smoke in tube   Evaluation Hemodynamic Status: BP stable throughout; O2 sats: stable throughout Patient's Current Condition: stable Complications: No apparent complications Patient did tolerate procedure well. Chest X-ray ordered to verify placement.  CXR: pending.   DougSimonne Maffucci3/2020

## 2018-11-19 NOTE — Progress Notes (Addendum)
PROGRESS NOTE  [name] [name] WCH:[id number] DOB: 1950-10-25 DOA: 11/10/2018  PCP: Nicholos Johns, MD  Brief History/Interval Summary: 69 y.o. female, with history of morbid obesity, DM type II, COPD not on oxygen, hypertension, CKD 3 baseline creatinine around 1.4, chronic atrial fibrillation Mal[place] vas 2 score of at least 3 on Xarelto and Cardizem, spine surgeries in the past, chronic fluid retention, history of Graves' disease now hypothyroid, history of CVA, history of DVT who lives at [facility] and was likely exposed to COVID-19 infection about 10 to 14 days prior to this admission.    She was experiencing fever, body aches and developed shortness of breath.  She finally decided to come into the emergency department at [facility].  She was found to be positive for COVID-19.  Was noted to have hypoxia.  She was hospitalized for further management.  Reason for Visit: Acute respiratory failure with hypoxia due to COVID-19  Consultants: Pulmonology  Procedures: None  Antibiotics: Anti-infectives (From admission, onward)   Start     Dose/Rate Route Frequency Ordered Stop   11/19/18 2030  remdesivir 100 mg in sodium chloride 0.9 % 250 mL IVPB     100 mg 500 mL/hr over 30 Minutes Intravenous Every 24 hours 11/20/2018 1855 11/23/18 2029   11/09/2018 2030  remdesivir 200 mg in sodium chloride 0.9 % 250 mL IVPB     200 mg 500 mL/hr over 30 Minutes Intravenous Once 11/04/2018 1855 11/12/2018 2056       Subjective/Interval History: Patient states that she feels short of breath this morning.  No change from yesterday.  Feels more tired today compared to yesterday.  Denies any chest pain.  No nausea or vomiting.  No abdominal pain.  No difficulty urinating.  She has chronic right lower extremity edema.  She has chronic skin changes in the left lower leg.    Assessment/Plan:  Acute Hypoxic Resp. Failure due to Acute Covid 19 Viral Illness  COVID-19 Labs  Recent Labs    11/13/2018 2000  11/19/18 0558  DDIMER 0.57* 0.52*  FERRITIN 1,209* 1,461*  LDH  --  441*  CRP 17.4* 16.2*    Lab Results  Component Value Date   SARSCOV2NAA POSITIVE (A) 10/28/2018     Fever: Afebrile since she has been in the hospital Oxygen requirements: On heated HF [place] 25 L/min.  80% FiO2.  Saturating in the late 80s to early 90s. Antibiotics: None Remdesivir: Day 2 today Steroids: Dexamethasone 6 mg every 12 hours Diuretics: Not on diuretics on a scheduled basis Actemra: Received 1 dose yesterday.  She will be given additional dose today. Vitamin C and Zinc: Will initiate DVT Prophylaxis: Patient is on rivaroxaban  Patient's respiratory status remains tenuous.  She is stable compared to yesterday.  Still requiring 25 L of oxygen by nasal cannula.  On 80% FiO2.  CRP has improved slightly but still elevated.  Ferritin elevated at 1461.  D-dimer 0.52.  LDH 441.  She is already on Remdesivir and steroids.  She was given 1 dose of Actemra yesterday.  Another dose will be repeated today.  Abdomen is benign to palpation.  No evidence for infection anywhere else.  Prone positioning.  Incentive spirometry mobilization.  Out of bed to chair.  The treatment plan and use of medications and known side effects were discussed with patient.  Some of the medications used are based on case reports/anecdotal data which are not peer-reviewed and has not been studied using randomized control trials.  Complete risks and long-term side effects are unknown, however in the best clinical judgment they seem to be of some benefit.  Patient agrees with the treatment plan and want to receive these treatments as indicated.  Patient given Actemra and steroids.  Acute renal failure on chronic kidney disease stage III Renal function has improved.  Baseline creatinine is around 1.4.  Creatinine is down to 1.29 today.  She was given IV fluids at admission.  This can be stopped.  Diuretics are on hold.  ARB is on hold.  She has chronic  lower extremity edema right greater than left.  She uses compression stockings at home.  Monitor urine output.  History of chronic atrial fibrillation Chads 2 vascular score is at least 3.  Patient is on Cardizem.  She is on rivaroxaban.  Currently in sinus rhythm.  Monitor on telemetry.  History of asthma versus COPD Continue her home medications.  Not wheezing currently.  She is not on home oxygen.  Hypothyroidism Continue levothyroxine.  History of stroke Patient is on Xarelto and aspirin.  This is being continued as she came into the hospital on these medications.  Not noted to be on statin.  Reason for this is not entirely clear.  Check lipid panel.  Will discuss with patient.  History of vitamin D deficiency Continue supplementation.  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  Stable.  Diabetes mellitus type 2 Continue SSI.  Worsening glycemic control due to steroids.  She is on SSI.  A1c 7.0.  Change SSI to resistant coverage.  She may need long-acting agents as well.  Morbid obesity BMI 42.43.  DVT Prophylaxis: Patient is on rivaroxaban PUD Prophylaxis: On Protonix Code Status: Full code Family Communication: Discussed with patient. Disposition Plan: She will remain in the ICU.   Medications:  Scheduled: . aspirin EC  81 mg Oral Daily  . cholecalciferol  2,000 Units Oral Daily  . dexamethasone (DECADRON) injection  6 mg Intravenous Q12H  . diltiazem  120 mg Oral Daily  . ferrous sulfate  325 mg Oral TID WC  . gabapentin  100 mg Oral TID  . insulin aspart  0-15 Units Subcutaneous TID WC  . insulin aspart  0-5 Units Subcutaneous QHS  . insulin aspart  3 Units Subcutaneous TID WC  . levothyroxine  112 mcg Oral Q0600  . mouth rinse  15 mL Mouth Rinse BID  . pantoprazole  40 mg Oral Daily  . rivaroxaban  20 mg Oral Q supper  . umeclidinium bromide  1  puff Inhalation Daily   Continuous: . remdesivir 100 mg in NS 250 mL     BCW:[id number] **OR** acetaminophen, albuterol, bisacodyl, diltiazem, docusate sodium, traZODone   Objective:  Vital Signs  Vitals:   11/19/18 0700 11/19/18 0731 11/19/18 0800 11/19/18 0900  BP: 107/82 107/82 (!) 114/97 121/60  Pulse: 77 83 82 88  Resp: (!) 26 (!) 28 (!) 30 (!) 31  Temp:   97.8 F (36.6 C)   TempSrc:   Axillary   SpO2: (!) 88% 90% (!) 87% (!) 87%  Weight:      Height:        Intake/Output Summary (Last 24 hours) at 11/19/2018 1129 Last data filed at 11/19/2018 0900 Gross per 24 hour  Intake 2113.34 ml  Output -  Net 2113.34 ml   Filed Weights   11/13/2018 1800 11/19/18 0500  Weight: 109.4  kg 108.6 kg    General appearance: Awake alert.  In no distress Resp: Tachypneic at rest.  No use of accessory muscles.  Coarse breath sounds bilaterally with crackles bilaterally.  No wheezing or rhonchi. Cardio: S1-S2 is normal regular.  No S3-S4.  No rubs murmurs or bruit GI: Abdomen is soft.  Nontender nondistended.  Bowel sounds are present normal.  No masses organomegaly Extremities: Swelling of the right leg greater than left which is chronic per patient.  Discoloration of the left lower leg due to hyperpigmentation is also chronic. Neurologic: Alert and oriented x3.  No focal neurological deficits.    Lab Results:  Data Reviewed: I have personally reviewed following labs and imaging studies  CBC: Recent Labs  Lab 10/30/2018 2000 11/19/18 0558  WBC 10.7* 10.1  NEUTROABS  --  8.8*  HGB 12.8 12.6  HCT 40.7 39.6  MCV 85.3 86.1  PLT 138* 136*    Basic Metabolic Panel: Recent Labs  Lab 10/29/2018 2000 11/19/18 0558  NA 138 136  K 4.9 5.0  CL 103 102  CO2 21* 24  GLUCOSE 245* 223*  BUN 38* 33*  CREATININE 1.45* 1.29*  CALCIUM 8.9 8.7*    GFR: Estimated Creatinine Clearance: 49.4 mL/min (A) (by C-G formula based on SCr of 1.29 mg/dL (H)).  Liver Function Tests: Recent  Labs  Lab 11/16/2018 2000 11/19/18 0558  AST 45* 49*  ALT 34 30  ALKPHOS 90 80  BILITOT 0.7 0.7  PROT 7.2 7.1  ALBUMIN 2.7* 2.8*    HbA1C: Recent Labs    11/25/2018 2000  HGBA1C 7.0*    CBG: Recent Labs  Lab 11/04/2018 2111 11/19/18 0731  GLUCAP 226* 210*    Lipid Profile: No results for input(s): CHOL, HDL, LDLCALC, TRIG, CHOLHDL, LDLDIRECT in the last 72 hours.  Thyroid Function Tests: No results for input(s): TSH, T4TOTAL, FREET4, T3FREE, THYROIDAB in the last 72 hours.  Anemia Panel: Recent Labs    11/12/2018 2000 11/19/18 0558  FERRITIN 1,209* 1,461*    Recent Results (from the past 240 hour(s))  SARS Coronavirus 2 (CEPHEID- Performed in [facility] hospital lab), Hosp Order     Status: Abnormal   Collection Time: 11/10/2018  8:29 PM   Specimen: Nasopharyngeal Swab  Result Value Ref Range Status   SARS Coronavirus 2 POSITIVE (A) NEGATIVE Final    Comment: CRITICAL RESULT CALLED TO, READ BACK BY AND VERIFIED WITH: RN P HOWARD AT 0106 11/19/18 CRUICKSHANK A (NOTE) If result is NEGATIVE SARS-CoV-2 target nucleic acids are NOT DETECTED. The SARS-CoV-2 RNA is generally detectable in upper and lower  respiratory specimens during the acute phase of infection. The lowest  concentration of SARS-CoV-2 viral copies this assay can detect is 250  copies / mL. A negative result does not preclude SARS-CoV-2 infection  and should not be used as the sole basis for treatment or other  patient management decisions.  A negative result may occur with  improper specimen collection / handling, submission of specimen other  than nasopharyngeal swab, presence of viral mutation(s) within the  areas targeted by this assay, and inadequate number of viral copies  (<250 copies / mL). A negative result must be combined with clinical  observations, patient history, and epidemiological information. If result is POSITIVE SARS-CoV-2 target nucleic acids a re DETECTED. The SARS-CoV-2 RNA is  generally detectable in upper and lower  respiratory specimens during the acute phase of infection.  Positive  results are indicative of active infection with SARS-CoV-2.  Clinical  correlation with patient history and other diagnostic information is  necessary to determine patient infection status.  Positive results do  not rule out bacterial infection or co-infection with other viruses. If result is PRESUMPTIVE POSTIVE SARS-CoV-2 nucleic acids MAY BE PRESENT.   A presumptive positive result was obtained on the submitted specimen  and confirmed on repeat testing.  While 2019 novel coronavirus  (SARS-CoV-2) nucleic acids may be present in the submitted sample  additional confirmatory testing may be necessary for epidemiological  and / or clinical management purposes  to differentiate between  SARS-CoV-2 and other Sarbecovirus currently known to infect humans.  If clinically indicated additional testing with an alternate test  methodolo gy [phone]) is advised. The SARS-CoV-2 RNA is generally  detectable in upper and lower respiratory specimens during the acute  phase of infection. The expected result is Negative. Fact Sheet for Patients:  StrictlyIdeas.no Fact Sheet for Healthcare Providers: BankingDealers.co.za This test is not yet approved or cleared by the [place] FDA and has been authorized for detection and/or diagnosis of SARS-CoV-2 by FDA under an Emergency Use Authorization (EUA).  This EUA will remain in effect (meaning this test can be used) for the duration of the COVID-19 declaration under Section 564(b)(1) of the Act, 21 U.S.C. section 360bbb-3(b)(1), unless the authorization is terminated or revoked sooner. Performed at [facility], [place] [address], [place], [place] [address]   MRSA PCR Screening     Status: None   Collection Time: 10/28/2018 11:15 PM   Specimen: Nasal Mucosa; Nasopharyngeal  Result  Value Ref Range Status   MRSA by PCR NEGATIVE NEGATIVE Final    Comment:        The GeneXpert MRSA Assay (FDA approved for NASAL specimens only), is one component of a comprehensive MRSA colonization surveillance program. It is not intended to diagnose MRSA infection nor to guide or monitor treatment for MRSA infections. Performed at [facility], [place] [address] [place], [place] [address]       Radiology Studies: Dg Chest Port 1 View  Result Date: 11/13/2018 CLINICAL DATA:  COVID-19 positive, shortness of breath EXAM: PORTABLE CHEST 1 VIEW COMPARISON:  Portable exam 1935 hours compared to 11/17/2018 FINDINGS: Based on multiple prior exams, current study is mislabeled LEFT versus RIGHT and is re-labeled. Chronic elevation of RIGHT diaphragm. Normal heart size and mediastinal contours. Patchy infiltrates LEFT lung. No pleural effusion or pneumothorax. IMPRESSION: Patchy LEFT lung infiltrates. Chronic elevation of RIGHT diaphragm. Electronically Signed   By: Lavonia Dana M.D.   On: 11/05/2018 20:08       LOS: 1 day   [facility] Pager on [email or web address]  11/19/2018, 11:29 AM

## 2018-11-19 NOTE — Progress Notes (Signed)
[place] for Rivaroxaban Indication: atrial fibrillation  Allergies  Allergen Reactions  . Sulfonamide Derivatives Itching    Patient Measurements: Height: 5\' 3"  (160 cm) Weight: 239 lb 8 oz (108.6 kg) IBW/kg (Calculated) : 52.4  Vital Signs: Temp: 97.6 F (36.4 C) (07/23 0610) Temp Source: Oral (07/23 0610) BP: 110/63 (07/23 0600) Pulse Rate: 72 (07/23 0600)  Labs: Recent Labs    11/07/2018 2000  HGB 12.8  HCT 40.7  PLT 138*  CREATININE 1.45*    Estimated Creatinine Clearance: 43.9 mL/min (A) (by C-G formula based on SCr of 1.45 mg/dL (H)).   Medical History: Past Medical History:  Diagnosis Date  . Arthritis   . Carpal tunnel syndrome   . Cataract   . Depression   . Esophagitis   . GERD (gastroesophageal reflux disease)   . Graves disease   . History of DVT (deep vein thrombosis)   . Hypertension   . Hypothyroidism   . MI (myocardial infarction) ([place])   . Mood disorder ([place])   . Peptic ulcer disease   . Pre-diabetes   . Stroke ([place])   . Thyroid disease   . Venous insufficiency   . Vitamin D deficiency     Medications:  PTA rivaroxaban 20 mg daily  Assessment: 68 y/o F with a h/o atrial fibrillation on rivaroxaban 20 mg daily admitted with COVID-19 PNA. Last dose of rivaroxaban was 7/21. Patient has a h/o CKD III with elevated SCr of 2.2 at [facility], no baseline to compare.   Today, 11/19/18, the SCr has improved to 1.45, and CrCl is now 64 ml/min  Plan:  - Restart rivaroxaban at PTA dose of 20 mg daily due to ARF on CKD.  - Monitor SCr, CBC, signs of bleding   Royetta Asal, PharmD, BCPS 11/19/2018 6:30 AM

## 2018-11-19 NOTE — Progress Notes (Signed)
eLink Physician-Brief Progress Note Patient Name: [name] [name] DOB: 10-15-1950 MRN: [id number]   Date of Service  11/19/2018  HPI/Events of Note  RN requests foley catheter for close monitoring of urine output  eICU Interventions  Foley catheter order placed.        [name] 11/19/2018, 11:40 PM

## 2018-11-19 NOTE — Progress Notes (Signed)
[facility] P[facility]M  Worsening work of breathing throughout daytime Plan intubation  Roselie Awkward, MD [facility] Pager: [phone] Cell: [phone] If no response, call [phone]

## 2018-11-19 NOTE — Procedures (Signed)
Intubation Procedure Note LAPORSHA GREALISH [id number] 05-30-1950  Procedure: Intubation Indications: Respiratory insufficiency  Procedure Details Consent: Risks of procedure as well as the alternatives and risks of each were explained to the (patient/caregiver).  Consent for procedure obtained. Time Out: Verified patient identification, verified procedure, site/side was marked, verified correct patient position, special equipment/implants available, medications/allergies/relevent history reviewed, required imaging and test results available.  Performed  Maximum sterile technique was used including gloves, gown, hand hygiene, mask and sheet.  MAC and 4    Evaluation Hemodynamic Status: BP stable throughout; O2 sats: stable throughout Patient's Current Condition: stable Complications: No apparent complications Patient did tolerate procedure well. Chest X-ray ordered to verify placement.  CXR: pending.  Pt intubated using Mac $ blade with 7.5 ett secured at 22 at the lip. Bilateral BS, positive color change noted on etco2, direct visualization, CXR pending.    Jesse Sans 11/19/2018

## 2018-11-19 NOTE — Progress Notes (Signed)
Spoke with patient's daughter for extensive time this AM. Daughter is located in [place], had no further questions and is pleased with the care her mother is receiving. Contact info updated.  Margaret Pyle, RN

## 2018-11-19 NOTE — Progress Notes (Signed)
  Amiodarone Drug - Drug Interaction Consult Note  Recommendations:  Amiodarone is metabolized by the cytochrome P450 system and therefore has the potential to cause many drug interactions. Amiodarone has an average plasma half-life of 50 days (range 20 to 100 days).   There is potential for drug interactions to occur several weeks or months after stopping treatment and the onset of drug interactions may be slow after initiating amiodarone.   []  Statins: Increased risk of myopathy. Simvastatin- restrict dose to 20mg  daily. Other statins: counsel patients to report any muscle pain or weakness immediately.  [x]  Anticoagulants: Amiodarone can increase anticoagulant effect. Consider warfarin dose reduction. Patients should be monitored closely and the dose of anticoagulant altered accordingly, remembering that amiodarone levels take several weeks to stabilize.  []  Antiepileptics: Amiodarone can increase plasma concentration of phenytoin, the dose should be reduced. Note that small changes in phenytoin dose can result in large changes in levels. Monitor patient and counsel on signs of toxicity.  []  Beta blockers: increased risk of bradycardia, AV block and myocardial depression. Sotalol - avoid concomitant use.  [x]   Calcium channel blockers (diltiazem and verapamil): increased risk of bradycardia, AV block and myocardial depression.  []   Cyclosporine: Amiodarone increases levels of cyclosporine. Reduced dose of cyclosporine is recommended.  []  Digoxin dose should be halved when amiodarone is started.  []  Diuretics: increased risk of cardiotoxicity if hypokalemia occurs.  []  Oral hypoglycemic agents (glyburide, glipizide, glimepiride): increased risk of hypoglycemia. Patient's glucose levels should be monitored closely when initiating amiodarone therapy.   []  Drugs that prolong the QT interval:  Torsades de pointes risk may be increased with concurrent use - avoid if possible.  Monitor QTc,  also keep magnesium/potassium WNL if concurrent therapy can't be avoided. Marland Kitchen Antibiotics: e.g. fluoroquinolones, erythromycin. . Antiarrhythmics: e.g. quinidine, procainamide, disopyramide, sotalol. . Antipsychotics: e.g. phenothiazines, haloperidol.  . Lithium, tricyclic antidepressants, and methadone. Thank You,  Ulice Dash D  11/19/2018 7:11 PM

## 2018-11-19 NOTE — Progress Notes (Addendum)
Patient working hard to breathe. HFNC increased to 35L/90%. O2 sats 88%. Notified Lake Bells, MD. Will intubate patient. Daughter, [name], notified of change in status and plan of care.

## 2018-11-19 NOTE — Progress Notes (Signed)
eLink Physician-Brief Progress Note Patient Name: [name] [name] DOB: [birth year] MRN: [id number]   Date of Service  11/19/2018  HPI/Events of Note  Pt is agitated on the ventilator  eICU Interventions  PAD protocol ordered. Versed  Boluses prn added and Fentanyl ceiling raised to 400 mcg.        Frederik Pear 11/19/2018, 9:33 PM

## 2018-11-19 NOTE — Progress Notes (Signed)
PT Cancellation Note  Patient Details Name: BREAN CARBERRY MRN: [id number] DOB: March 12, 1951   Cancelled Treatment:    Reason Eval/Treat Not Completed: Medical issues which prohibited therapy;Patient not medically ready Per RN, desats with minimal activity. PT will check back tomorrow.  Claretha Cooper 11/19/2018, 1:20 PM

## 2018-11-19 NOTE — Consult Note (Signed)
NAME:  [name] [name], MRN:  [id number], DOB:  05/10/1950, LOS: 1 ADMISSION DATE:  11/26/2018, CONSULTATION DATE: July 22 REFERRING MD: Dr. Candiss Norse, CHIEF COMPLAINT: Dyspnea  Brief History   68 year old female admitted for severe acute respiratory failure with hypoxemia due to COVID-19 pneumonia.  History of present illness   This is a pleasant 68 year old female with multiple comorbid illnesses who developed low-grade fevers, body ache and fatigue for several days prior to go to the emergency room on November 18, 2018.  She went to the [facility] emergency room where she was diagnosed with COVID-19 pneumonia and severe acute respiratory failure with hypoxemia.  She was transferred to [facility] for further management.  She was admitted overnight and has been treated with heated high flow oxygen.  She says that her oxygen is about the same today as compared to yesterday.  She says that she does have some cough, minimal mucus production.  Does not feel excessively short of breath right now.  She has been able to lie in the prone position some since coming to the emergency room.  She does have some appetite and has been eating today.  Past Medical History  Diabetes mellitus type 2 Obesity Asthma, has been on inhalers in the past, never smoked cigarettes CKD stage III Chronic A. fib History of spine surgery History of Graves' disease, now hypothyroid History of stroke History of DVT Lives in an assisted living facility GERD  Significant Hospital Events   July 22 admitted  Consults:  PCCM  Procedures:    Significant Diagnostic Tests:    Micro Data:  July 22 SARS COV 2 Positive  Antimicrobials:  July 22 remdesivir July 22 decadron July 22> 23 Toclizumab   Interim history/subjective:  Feels about the same this morning Still has some mild shortness of breath Cough, minimal mucus production Appetite is okay  Objective   Blood pressure 136/64, pulse 98, temperature 98.3  F (36.8 C), temperature source Oral, resp. rate (!) 25, height 5\' 3"  (1.6 m), weight 108.6 kg, SpO2 96 %.    FiO2 (%):  [70 %-90 %] 90 %   Intake/Output Summary (Last 24 hours) at 11/19/2018 1527 Last data filed at 11/19/2018 1400 Gross per 24 hour  Intake 2474.88 ml  Output --  Net 2474.88 ml   Filed Weights   11/03/2018 1800 11/19/18 0500  Weight: 109.4 kg 108.6 kg    Examination:  General:  Resting comfortably in bed HENT: NCAT OP clear PULM: Crackles bases B, normal effort CV: RRR, no mgr GI: BS+, soft, nontender MSK: normal bulk and tone Neuro: awake, alert, no distress, MAEW   July 22 chest x-ray images independently reviewed showing cardiomegaly, low lung volumes, bibasilar airspace disease, elevated right hemidiaphragm.  Resolved Hospital Problem list     Assessment & Plan:  Severe acute respiratory failure with hypoxemia due to COVID-19 pneumonia: Continue dexamethasone Continue remdesivir Status post second dose of Actemra today Continue heated high flow oxygen, close monitoring in the intensive care unit as she is at increased risk for decompensation and need for mechanical ventilation Tolerate periods of hypoxemia, goal at rest is greater than 85% SaO2, with movement ideally above 75% Decision for intubation should be based on a change in mental status or physical evidence of ventilatory failure such as nasal flaring, accessory muscle use, paradoxical breathing Out of bed to chair as able Prone positioning while in bed  Atrial fibrillation: Continue Xarelto Continue telemetry monitoring  History of DVT Continue  Xarelto  Asthma? PRN albuterol Stop incruise Start Breo  Goals of care: I talked to her today about the reality of worsening respiratory failure and need for mechanical ventilation.  I explained that this process will take weeks to recover and should she mechanical ventilation she would likely need to be on it for several weeks.  She says that  at this time she is willing to go on mechanical ventilation if necessary.  Best practice:  Diet: NPO Pain/Anxiety/Delirium protocol (if indicated): n/a VAP protocol (if indicated): n/a DVT prophylaxis: xarelto GI prophylaxis: n/a Glucose control: per TRH Mobility: bed rest toay Code Status: full code per patient preference Family Communication: per [facility] Disposition: remain in ICU  Labs   CBC: Recent Labs  Lab 11/19/2018 2000 11/19/18 0558  WBC 10.7* 10.1  NEUTROABS  --  8.8*  HGB 12.8 12.6  HCT 40.7 39.6  MCV 85.3 86.1  PLT 138* 136*    Basic Metabolic Panel: Recent Labs  Lab 11/11/2018 2000 11/19/18 0558  NA 138 136  K 4.9 5.0  CL 103 102  CO2 21* 24  GLUCOSE 245* 223*  BUN 38* 33*  CREATININE 1.45* 1.29*  CALCIUM 8.9 8.7*   GFR: Estimated Creatinine Clearance: 49.4 mL/min (A) (by C-G formula based on SCr of 1.29 mg/dL (H)). Recent Labs  Lab 11/17/2018 2000 11/19/18 0558  WBC 10.7* 10.1    Liver Function Tests: Recent Labs  Lab 11/11/2018 2000 11/19/18 0558  AST 45* 49*  ALT 34 30  ALKPHOS 90 80  BILITOT 0.7 0.7  PROT 7.2 7.1  ALBUMIN 2.7* 2.8*   No results for input(s): LIPASE, AMYLASE in the last 168 hours. No results for input(s): AMMONIA in the last 168 hours.  ABG    Component Value Date/Time   PHART 7.387 10/23/2016 1002   PCO2ART 48.8 (H) 10/23/2016 1002   PO2ART 334.0 (H) 10/23/2016 1002   HCO3 29.4 (H) 10/23/2016 1002   TCO2 31 10/23/2016 1002   O2SAT 100.0 10/23/2016 1002     Coagulation Profile: No results for input(s): INR, PROTIME in the last 168 hours.  Cardiac Enzymes: No results for input(s): CKTOTAL, CKMB, CKMBINDEX, TROPONINI in the last 168 hours.  HbA1C: Hgb A1c MFr Bld  Date/Time Value Ref Range Status  11/07/2018 08:00 PM 7.0 (H) 4.8 - 5.6 % Final    Comment:    (NOTE) Pre diabetes:          5.7%-6.4% Diabetes:              >6.4% Glycemic control for   <7.0% adults with diabetes   10/18/2016 10:47 AM 6.0 (H)  4.8 - 5.6 % Final    Comment:    (NOTE)         Pre-diabetes: 5.7 - 6.4         Diabetes: >6.4         Glycemic control for adults with diabetes: <7.0     CBG: Recent Labs  Lab 11/25/2018 2111 11/19/18 0731 11/19/18 1208  GLUCAP 226* 210* 299*    Review of Systems:   Gen: per HPI HEENT: Denies blurred vision, double vision, hearing loss, tinnitus, sinus congestion, rhinorrhea, sore throat, neck stiffness, dysphagia PULM: per HPI CV: Denies chest pain, edema, orthopnea, paroxysmal nocturnal dyspnea, palpitations GI: Denies abdominal pain, nausea, vomiting, diarrhea, hematochezia, melena, constipation, change in bowel habits GU: Denies dysuria, hematuria, polyuria, oliguria, urethral discharge Endocrine: Denies hot or cold intolerance, polyuria, polyphagia or appetite change Derm: Denies rash, dry  skin, scaling or peeling skin change Heme: Denies easy bruising, bleeding, bleeding gums Neuro: Denies headache, numbness, weakness, slurred speech, loss of memory or consciousness   Past Medical History  She,  has a past medical history of Arthritis, Carpal tunnel syndrome, Cataract, Depression, Esophagitis, GERD (gastroesophageal reflux disease), Graves disease, History of DVT (deep vein thrombosis), Hypertension, Hypothyroidism, MI (myocardial infarction) ([place]), Mood disorder ([place]), Peptic ulcer disease, Pre-diabetes, Stroke ([place]), Thyroid disease, Venous insufficiency, and Vitamin D deficiency.   Surgical History    Past Surgical History:  Procedure Laterality Date   COLONOSCOPY  01/19/2015   Colonic polyp status post polypectomy. Limited examination of the right side of the colon. Mild sigmoid diverticulosis. Tubular adenoma.    LEFT HEART CATH AND CORONARY ANGIOGRAPHY N/A 10/25/2016   Procedure: Left Heart Cath and Coronary Angiography;  Surgeon: Belva Crome, MD;  Location: [place] [facility];  Service: Cardiovascular;  Laterality: N/A;   LUMBAR LAMINECTOMY/DECOMPRESSION  MICRODISCECTOMY N/A 10/23/2016   Procedure: LUMBAR LAMINECTOMY/DECOMPRESSION MICRODISCECTOMY;  Surgeon: Latanya Maudlin, MD;  Location: [facility];  Service: Orthopedics;  Laterality: N/A;   TUBAL LIGATION     ULTRASOUND GUIDANCE FOR VASCULAR ACCESS  10/25/2016   Procedure: Ultrasound Guidance For Vascular Access;  Surgeon: Belva Crome, MD;  Location: [place] [facility];  Service: Cardiovascular;;     Social History   reports that she has never smoked. She has never used smokeless tobacco. She reports that she does not drink alcohol or use drugs.   Family History   Her family history includes Hypertension in her father and mother. There is no history of Colon cancer, Esophageal cancer, Rectal cancer, or Stomach cancer.   Allergies Allergies  Allergen Reactions   Sulfonamide Derivatives Itching     Home Medications  Prior to Admission medications   Medication Sig Start Date End Date Taking? Authorizing Provider  albuterol (PROVENTIL HFA;VENTOLIN HFA) 108 (90 BASE) MCG/ACT inhaler Inhale 2 puffs into the lungs every 6 (six) hours as needed for wheezing or shortness of breath. 06/21/14  Yes Reyne Dumas, MD  aspirin EC 81 MG tablet Take 81 mg by mouth daily.   Yes [provider]  budesonide-formoterol (SYMBICORT) 160-4.5 MCG/ACT inhaler Inhale 2 puffs into the lungs 2 (two) times daily.   Yes [provider]  diltiazem (CARDIZEM CD) 120 MG 24 hr capsule TAKE 1 CAPSULE BY MOUTH DAILY. 03/04/18  Yes Revankar, Reita Cliche, MD  docusate sodium (COLACE) 100 MG capsule Take 200 mg by mouth 2 (two) times daily as needed for mild constipation.   Yes [provider]  ferrous sulfate 325 (65 FE) MG EC tablet Take 325 mg by mouth 3 (three) times daily with meals.   Yes [provider]  fluticasone (FLONASE) 50 MCG/ACT nasal spray Place 2 sprays into both nostrils daily as needed for allergies or rhinitis.   Yes [provider]  gabapentin (NEURONTIN) 300 MG  capsule Take 300 mg by mouth 3 (three) times daily.   Yes [provider]  hydrochlorothiazide (MICROZIDE) 12.5 MG capsule daily. 09/15/18  Yes [provider]  hydrOXYzine (ATARAX/VISTARIL) 25 MG tablet Take 1 tablet by mouth daily. 10/08/18  Yes [provider]  levothyroxine (SYNTHROID) 200 MCG tablet Take 1 tablet by mouth daily with breakfast. 11/13/18  Yes [provider]  loratadine (CLARITIN) 10 MG tablet Take 10 mg by mouth daily as needed for allergies.   Yes [provider]  losartan (COZAAR) 50 MG tablet Take 50 mg  by mouth daily.   Yes [provider]  montelukast (SINGULAIR) 10 MG tablet Take 10 mg by mouth at bedtime.   Yes [provider]  omega-3 acid ethyl esters (LOVAZA) 1 G capsule Take 4 g by mouth daily.   Yes [provider]  omeprazole (PRILOSEC) 20 MG capsule Take 20 mg by mouth daily.   Yes [provider]  potassium chloride SA (K-DUR,KLOR-CON) 20 MEQ tablet Take 100-120 mEq by mouth See admin instructions. Pt to take 6 tablets in the morning, and 5 tablets in the evening   Yes [provider]  spironolactone (ALDACTONE) 25 MG tablet Take 25 mg by mouth daily.   Yes [provider]  tiotropium (SPIRIVA) 18 MCG inhalation capsule Place 18 mcg into inhaler and inhale daily.   Yes [provider]  venlafaxine XR (EFFEXOR-XR) 75 MG 24 hr capsule Take 75 mg by mouth daily with breakfast.   Yes [provider]  Vitamin D, Ergocalciferol, (DRISDOL) 1.25 MG (50000 UT) CAPS capsule Take 1 capsule by mouth every 7 (seven) days. Take on Sundays 08/31/18  Yes [provider]  XARELTO 20 MG TABS tablet TAKE 1 TABLET(20 MG) BY MOUTH DAILY 09/18/18  Yes Revankar, Reita Cliche, MD     Critical care time: 35 minutes    Roselie Awkward, MD [place] P[facility]M Pager: [phone] Cell: [phone] If no response, call [phone]

## 2018-11-19 NOTE — Progress Notes (Signed)
eLink Physician-Brief Progress Note Patient Name: TOMIE SPIZZIRRI DOB: [birth year] MRN: [id number]   Date of Service  11/19/2018  HPI/Events of Note  RN requests check of central line position  eICU Interventions  Central line in good position in svc, no pneumothorax. RN notified.        Kerry Kass Jsoeph Podesta 11/19/2018, 8:07 PM

## 2018-11-20 ENCOUNTER — Other Ambulatory Visit: Payer: Self-pay | Admitting: *Deleted

## 2018-11-20 ENCOUNTER — Inpatient Hospital Stay (HOSPITAL_COMMUNITY): Payer: Medicare Other

## 2018-11-20 DIAGNOSIS — I472 Ventricular tachycardia, unspecified: Secondary | ICD-10-CM

## 2018-11-20 DIAGNOSIS — I361 Nonrheumatic tricuspid (valve) insufficiency: Secondary | ICD-10-CM

## 2018-11-20 LAB — POCT I-STAT 7, (LYTES, BLD GAS, ICA,H+H)
Acid-base deficit: 3 mmol/L — ABNORMAL HIGH (ref 0.0–2.0)
Acid-base deficit: 4 mmol/L — ABNORMAL HIGH (ref 0.0–2.0)
Acid-base deficit: 4 mmol/L — ABNORMAL HIGH (ref 0.0–2.0)
Bicarbonate: 23.3 mmol/L (ref 20.0–28.0)
Bicarbonate: 25.7 mmol/L (ref 20.0–28.0)
Bicarbonate: 25.9 mmol/L (ref 20.0–28.0)
Calcium, Ion: 1.26 mmol/L (ref 1.15–1.40)
Calcium, Ion: 1.3 mmol/L (ref 1.15–1.40)
Calcium, Ion: 1.29 mmol/L (ref 1.15–1.40)
HCT: 40 % (ref 36.0–46.0)
HCT: 39 % (ref 36.0–46.0)
HCT: 40 % (ref 36.0–46.0)
Hemoglobin: 13.6 g/dL (ref 12.0–15.0)
Hemoglobin: 13.3 g/dL (ref 12.0–15.0)
Hemoglobin: 13.6 g/dL (ref 12.0–15.0)
O2 Saturation: 92 %
O2 Saturation: 93 %
O2 Saturation: 97 %
Patient temperature: 98.1
Patient temperature: 99
Patient temperature: 99
Potassium: 5.4 mmol/L — ABNORMAL HIGH (ref 3.5–5.1)
Potassium: 5.4 mmol/L — ABNORMAL HIGH (ref 3.5–5.1)
Potassium: 5.5 mmol/L — ABNORMAL HIGH (ref 3.5–5.1)
Sodium: 136 mmol/L (ref 135–145)
Sodium: 137 mmol/L (ref 135–145)
Sodium: 138 mmol/L (ref 135–145)
TCO2: 25 mmol/L (ref 22–32)
TCO2: 28 mmol/L (ref 22–32)
TCO2: 28 mmol/L (ref 22–32)
pCO2 arterial: 45.3 mmHg (ref 32.0–48.0)
pCO2 arterial: 69.3 mmHg (ref 32.0–48.0)
pCO2 arterial: 69.4 mmHg (ref 32.0–48.0)
pH, Arterial: 7.318 — ABNORMAL LOW (ref 7.350–7.450)
pH, Arterial: 7.179 — CL (ref 7.350–7.450)
pH, Arterial: 7.182 — CL (ref 7.350–7.450)
pO2, Arterial: 70 mmHg — ABNORMAL LOW (ref 83.0–108.0)
pO2, Arterial: 85 mmHg (ref 83.0–108.0)
pO2, Arterial: 118 mmHg — ABNORMAL HIGH (ref 83.0–108.0)

## 2018-11-20 LAB — ECHOCARDIOGRAM LIMITED
Height: 63 in
Weight: 3890.68 oz

## 2018-11-20 LAB — GLUCOSE, CAPILLARY
Glucose-Capillary: 237 mg/dL — ABNORMAL HIGH (ref 70–99)
Glucose-Capillary: 243 mg/dL — ABNORMAL HIGH (ref 70–99)
Glucose-Capillary: 217 mg/dL — ABNORMAL HIGH (ref 70–99)
Glucose-Capillary: 200 mg/dL — ABNORMAL HIGH (ref 70–99)
Glucose-Capillary: 221 mg/dL — ABNORMAL HIGH (ref 70–99)

## 2018-11-20 LAB — COMPREHENSIVE METABOLIC PANEL
ALT: 41 U/L (ref 0–44)
AST: 52 U/L — ABNORMAL HIGH (ref 15–41)
Albumin: 2.6 g/dL — ABNORMAL LOW (ref 3.5–5.0)
Alkaline Phosphatase: 96 U/L (ref 38–126)
Anion gap: 12 (ref 5–15)
BUN: 39 mg/dL — ABNORMAL HIGH (ref 8–23)
CO2: 23 mmol/L (ref 22–32)
Calcium: 8.6 mg/dL — ABNORMAL LOW (ref 8.9–10.3)
Chloride: 101 mmol/L (ref 98–111)
Creatinine, Ser: 1.35 mg/dL — ABNORMAL HIGH (ref 0.44–1.00)
GFR calc Af Amer: 47 mL/min — ABNORMAL LOW (ref >60)
GFR calc non Af Amer: 40 mL/min — ABNORMAL LOW (ref >60)
Glucose, Bld: 247 mg/dL — ABNORMAL HIGH (ref 70–99)
Potassium: 5.4 mmol/L — ABNORMAL HIGH (ref 3.5–5.1)
Sodium: 136 mmol/L (ref 135–145)
Total Bilirubin: 0.5 mg/dL (ref 0.3–1.2)
Total Protein: 7 g/dL (ref 6.5–8.1)

## 2018-11-20 LAB — CBC WITH DIFFERENTIAL/PLATELET
Abs Immature Granulocytes: 0.61 10*3/uL — ABNORMAL HIGH (ref 0.00–0.07)
Basophils Absolute: 0.1 10*3/uL (ref 0.0–0.1)
Basophils Relative: 0 %
Eosinophils Absolute: 0.1 10*3/uL (ref 0.0–0.5)
Eosinophils Relative: 0 %
HCT: 42.1 % (ref 36.0–46.0)
Hemoglobin: 12.8 g/dL (ref 12.0–15.0)
Immature Granulocytes: 4 %
Lymphocytes Relative: 7 %
Lymphs Abs: 1.1 10*3/uL (ref 0.7–4.0)
MCH: 26.7 pg (ref 26.0–34.0)
MCHC: 30.4 g/dL (ref 30.0–36.0)
MCV: 87.9 fL (ref 80.0–100.0)
Monocytes Absolute: 0.9 10*3/uL (ref 0.1–1.0)
Monocytes Relative: 6 %
Neutro Abs: 13.9 10*3/uL — ABNORMAL HIGH (ref 1.7–7.7)
Neutrophils Relative %: 83 %
Platelets: 186 10*3/uL (ref 150–400)
RBC: 4.79 MIL/uL (ref 3.87–5.11)
RDW: 13.2 % (ref 11.5–15.5)
WBC: 16.6 10*3/uL — ABNORMAL HIGH (ref 4.0–10.5)
nRBC: 0.2 % (ref 0.0–0.2)

## 2018-11-20 LAB — MAGNESIUM
Magnesium: 2.1 mg/dL (ref 1.7–2.4)
Magnesium: 2.2 mg/dL (ref 1.7–2.4)

## 2018-11-20 LAB — PHOSPHORUS
Phosphorus: 4.1 mg/dL (ref 2.5–4.6)
Phosphorus: 5 mg/dL — ABNORMAL HIGH (ref 2.5–4.6)

## 2018-11-20 LAB — TROPONIN I (HIGH SENSITIVITY)
Troponin I (High Sensitivity): 81 ng/L — ABNORMAL HIGH (ref <18)
Troponin I (High Sensitivity): 74 ng/L — ABNORMAL HIGH (ref <18)

## 2018-11-20 LAB — FERRITIN: Ferritin: 2016 ng/mL — ABNORMAL HIGH (ref 11–307)

## 2018-11-20 LAB — D-DIMER, QUANTITATIVE: D-Dimer, Quant: 2.06 ug/mL-FEU — ABNORMAL HIGH (ref 0.00–0.50)

## 2018-11-20 LAB — C-REACTIVE PROTEIN: CRP: 13 mg/dL — ABNORMAL HIGH

## 2018-11-20 LAB — TSH: TSH: <0.01 u[IU]/mL — ABNORMAL LOW (ref 0.350–4.500)

## 2018-11-20 LAB — LACTATE DEHYDROGENASE: LDH: 518 U/L — ABNORMAL HIGH (ref 98–192)

## 2018-11-20 MED ORDER — MIDAZOLAM BOLUS VIA INFUSION
1.0000 mg | INTRAVENOUS | Status: DC | PRN
  Administered 2018-11-20: 10:00:00 1 mg via INTRAVENOUS
  Administered 2018-11-21 – 2018-11-26 (×4): 2 mg via INTRAVENOUS
  Filled 2018-11-20: qty 2

## 2018-11-20 MED ORDER — SODIUM ZIRCONIUM CYCLOSILICATE 10 G PO PACK
10.0000 g | PACK | Freq: Once | ORAL | Status: AC
  Administered 2018-11-20: 21:00:00 10 g via ORAL
  Filled 2018-11-20 (×2): qty 1

## 2018-11-20 MED ORDER — PRO-STAT SUGAR FREE PO LIQD: 30.0000 mL | Freq: Three times a day (TID) | ORAL | Status: DC

## 2018-11-20 MED ORDER — VITAL AF 1.2 CAL PO LIQD
1000.0000 mL | ORAL | Status: DC
  Administered 2018-11-20 – 2018-12-08 (×21): 1000 mL
  Filled 2018-11-20: qty 1000

## 2018-11-20 MED ORDER — GABAPENTIN 250 MG/5ML PO SOLN
100.0000 mg | Freq: Three times a day (TID) | ORAL | Status: DC
  Administered 2018-11-20 – 2018-12-09 (×56): 100 mg
  Filled 2018-11-20 (×63): qty 2

## 2018-11-20 MED ORDER — FENTANYL CITRATE (PF) 100 MCG/2ML IJ SOLN
25.0000 ug | Freq: Once | INTRAMUSCULAR | Status: AC
  Administered 2018-11-20: 09:00:00 25 ug via INTRAVENOUS

## 2018-11-20 MED ORDER — PRO-STAT SUGAR FREE PO LIQD: 30.0000 mL | Freq: Two times a day (BID) | ORAL | Status: DC

## 2018-11-20 MED ORDER — INSULIN DETEMIR 100 UNIT/ML ~~LOC~~ SOLN
15.0000 [IU] | Freq: Two times a day (BID) | SUBCUTANEOUS | Status: DC
  Administered 2018-11-20: 21:00:00 15 [IU] via SUBCUTANEOUS
  Filled 2018-11-20 (×2): qty 0.15

## 2018-11-20 MED ORDER — ORAL CARE MOUTH RINSE
15.0000 mL | OROMUCOSAL | Status: DC
  Administered 2018-11-20 – 2018-12-09 (×190): 15 mL via OROMUCOSAL

## 2018-11-20 MED ORDER — VECURONIUM BROMIDE 10 MG IV SOLR
10.0000 mg | INTRAVENOUS | Status: DC | PRN
  Administered 2018-11-20 – 2018-11-25 (×12): 10 mg via INTRAVENOUS
  Filled 2018-11-20 (×12): qty 10

## 2018-11-20 MED ORDER — SODIUM ZIRCONIUM CYCLOSILICATE 10 G PO PACK
10.0000 g | PACK | Freq: Once | ORAL | Status: AC
  Administered 2018-11-20: 12:00:00 10 g via ORAL
  Filled 2018-11-20: qty 1

## 2018-11-20 MED ORDER — VITAL AF 1.2 CAL PO LIQD: 1000.0000 mL | ORAL | Status: DC

## 2018-11-20 MED ORDER — FENTANYL BOLUS VIA INFUSION
25.0000 ug | INTRAVENOUS | Status: DC | PRN
  Administered 2018-11-21 – 2018-11-26 (×3): 25 ug via INTRAVENOUS
  Filled 2018-11-20: qty 25

## 2018-11-20 MED ORDER — ARTIFICIAL TEARS OPHTHALMIC OINT
1.0000 "application " | TOPICAL_OINTMENT | Freq: Three times a day (TID) | OPHTHALMIC | Status: DC
  Administered 2018-11-20 – 2018-11-28 (×20): 1 via OPHTHALMIC
  Filled 2018-11-20 (×2): qty 3.5

## 2018-11-20 MED ORDER — ADULT MULTIVITAMIN W/MINERALS CH
1.0000 | ORAL_TABLET | Freq: Every day | ORAL | Status: DC
  Administered 2018-11-20 – 2018-12-09 (×20): 1
  Filled 2018-11-20 (×19): qty 1

## 2018-11-20 MED ORDER — FENTANYL 2500MCG IN NS 250ML (10MCG/ML) PREMIX INFUSION
25.0000 ug/h | INTRAVENOUS | Status: DC
  Administered 2018-11-20: 10:00:00 300 ug/h via INTRAVENOUS
  Administered 2018-11-20 – 2018-11-22 (×3): 200 ug/h via INTRAVENOUS
  Administered 2018-11-22: 250 ug/h via INTRAVENOUS
  Administered 2018-11-22: 12:00:00 200 ug/h via INTRAVENOUS
  Administered 2018-11-23 – 2018-11-24 (×3): 250 ug/h via INTRAVENOUS
  Administered 2018-11-25: 200 ug/h via INTRAVENOUS
  Administered 2018-11-26 (×2): 150 ug/h via INTRAVENOUS
  Administered 2018-11-27 – 2018-11-28 (×2): 175 ug/h via INTRAVENOUS
  Filled 2018-11-20 (×14): qty 250

## 2018-11-20 MED ORDER — INSULIN DETEMIR 100 UNIT/ML ~~LOC~~ SOLN
5.0000 [IU] | Freq: Once | SUBCUTANEOUS | Status: AC
  Administered 2018-11-20: 12:00:00 5 [IU] via SUBCUTANEOUS
  Filled 2018-11-20: qty 0.05

## 2018-11-20 MED ORDER — MIDAZOLAM 50MG/50ML (1MG/ML) PREMIX INFUSION
2.0000 mg/h | INTRAVENOUS | Status: DC
  Administered 2018-11-20: 18:00:00 8 mg/h via INTRAVENOUS
  Administered 2018-11-20: 10 mg/h via INTRAVENOUS
  Administered 2018-11-21 – 2018-11-24 (×9): 8 mg/h via INTRAVENOUS
  Administered 2018-11-25: 15:00:00 3 mg/h via INTRAVENOUS
  Administered 2018-11-26: 4 mg/h via INTRAVENOUS
  Administered 2018-11-27: 1 mg/h via INTRAVENOUS
  Administered 2018-11-28: 2 mg/h via INTRAVENOUS
  Filled 2018-11-20 (×12): qty 50

## 2018-11-20 MED ORDER — CHLORHEXIDINE GLUCONATE 0.12% ORAL RINSE (MEDLINE KIT): 15.0000 mL | Freq: Two times a day (BID) | OROMUCOSAL | Status: DC

## 2018-11-20 MED ORDER — FERROUS SULFATE 300 (60 FE) MG/5ML PO SYRP
300.0000 mg | ORAL_SOLUTION | Freq: Three times a day (TID) | ORAL | Status: DC
  Administered 2018-11-20 – 2018-12-09 (×57): 300 mg
  Filled 2018-11-20 (×62): qty 5

## 2018-11-20 MED ORDER — PRO-STAT SUGAR FREE PO LIQD
30.0000 mL | Freq: Four times a day (QID) | ORAL | Status: DC
  Administered 2018-11-20 – 2018-12-09 (×75): 30 mL
  Filled 2018-11-20 (×66): qty 30

## 2018-11-20 MED ORDER — VITAL HIGH PROTEIN PO LIQD: 1000.0000 mL | ORAL | Status: DC

## 2018-11-20 MED ORDER — SODIUM CHLORIDE 0.9 % IV SOLN
INTRAVENOUS | Status: DC | PRN
  Administered 2018-11-20 – 2018-12-07 (×4): 250 mL via INTRAVENOUS
  Administered 2018-12-07: 200 mL via INTRAVENOUS

## 2018-11-20 NOTE — Progress Notes (Signed)
  Echocardiogram 2D Echocardiogram limited has been performed.  Darlina Sicilian M 11/20/2018, 11:10 AM

## 2018-11-20 NOTE — Progress Notes (Signed)
OT Cancellation Note  Patient Details Name: [name] [name] MRN: [id number] DOB: 12/30/1950   Cancelled Treatment:    Reason Eval/Treat Not Completed: Patient not medically ready  Ramond Dial, OT/L   Acute OT Clinical Specialist Acute Rehabilitation Services Pager [phone] Office [phone]  11/20/2018, 12:46 PM

## 2018-11-20 NOTE — Progress Notes (Signed)
271ml of fentanyl wasted with Marcelino Duster, RN for site change. Switched from peripheral infusion to central line infusion due to loss of PIV access.

## 2018-11-20 NOTE — Progress Notes (Signed)
NAME:  [name] [name], MRN:  [id number], DOB:  [birth year], LOS: 2 ADMISSION DATE:  11/23/2018, CONSULTATION DATE: July 23 REFERRING MD: Dr. Candiss Norse, CHIEF COMPLAINT: Dyspnea  Brief History   68 year old female admitted for severe acute respiratory failure with hypoxemia due to COVID-19 pneumonia.  Past Medical History  Diabetes mellitus type 2 Obesity Asthma, has been on inhalers in the past, never smoked cigarettes CKD stage III Chronic A. fib History of spine surgery History of Graves' disease, now hypothyroid History of stroke History of DVT Lives in an assisted living facility [place] Hospital Events   July 22 admitted July 23 intubated  Consults:  PCCM  Procedures:  7/23 ETT > 7/23 L IJ CVL >  Significant Diagnostic Tests:  7/24 Echo  Micro Data:  July 22 SARS COV 2 Positive  Antimicrobials:  July 22 remdesivir July 22 decadron July 22> 23 Toclizumab   Interim history/subjective:  Intubated yesterday due to increased work of breathing Central line placed Very dyssynchronous on the ventilator this morning Low dose levophed VT overnight, on amiodarone  Objective   Blood pressure 119/66, pulse (!) 56, temperature 98.1 F (36.7 C), temperature source Oral, resp. rate 17, height 5\' 3"  (1.6 m), weight 110.3 kg, SpO2 96 %. CVP:  [7 mmHg-10 mmHg] 7 mmHg  Vent Mode: PRVC FiO2 (%):  [80 %-100 %] 100 % Set Rate:  [20 bmp-30 bmp] 30 bmp Vt Set:  [310 mL] 310 mL PEEP:  [10 cmH20-14 cmH20] 14 cmH20 Plateau Pressure:  [16 cmH20-23 cmH20] 22 cmH20   Intake/Output Summary (Last 24 hours) at 11/20/2018 1044 Last data filed at 11/20/2018 0600 Gross per 24 hour  Intake 1193.94 ml  Output 355 ml  Net 838.94 ml   Filed Weights   11/21/2018 1800 11/19/18 0500 11/20/18 0150  Weight: 109.4 kg 108.6 kg 110.3 kg    Examination:  General:  In bed on vent HENT: NCAT ETT in place PULM: CTA B, vent supported breathing CV: RRR, no mgr GI: BS+, soft,  nontender MSK: normal bulk and tone Neuro: sedated on vent   Resolved Hospital Problem list     Assessment & Plan:  Severe acute respiratory failure with hypoxemia secondary to ARDS from COVID pneumonia Continue mechanical ventilation per ARDS protocol Target TVol 6-8cc/kgIBW Target Plateau Pressure < 30cm H20 Target driving pressure less than 15 cm of water Target PaO2 55-65: titrate PEEP/FiO2 per protocol As long as PaO2 to FiO2 ratio is less than 1:150 position in prone position for 16 hours a day Check CVP daily if CVL in place Target CVP less than 4, diurese as necessary Ventilator associated pneumonia prevention protocol  VT overnight/shock: electrolytes OK Echo Tele Amiodarone K> 4, Mg > 2  Need for sedation for ventilator dyssynchrony: Start neuromuscular blockade protocol, intermittent dosing protocol Target RA SS score -4 to -5 Fentanyl, Versed infusions  COVID-19 pneumonia: Continue decadron Continue remdesivir  History of DVT Xarelto   Best practice:  Diet: start tube feeding Pain/Anxiety/Delirium protocol (if indicated): RASS score -4 to -5 VAP protocol (if indicated): yes DVT prophylaxis: Xarelto GI prophylaxis: protonix Glucose control: per TRH Mobility: bed rest Code Status: full Family Communication: I updated her daughter [name] today by phone Disposition: remain in ICU  Labs   CBC: Recent Labs  Lab 11/06/2018 2000 11/19/18 0558 11/19/18 1923 11/20/18 0651 11/20/18 0907  WBC 10.7* 10.1  --  16.6*  --   NEUTROABS  --  8.8*  --  13.9*  --  HGB 12.8 12.6 13.9 12.8 13.6  HCT 40.7 39.6 41.0 42.1 40.0  MCV 85.3 86.1  --  87.9  --   PLT 138* 136*  --  186  --     Basic Metabolic Panel: Recent Labs  Lab 11/25/2018 2000 11/19/18 0558 11/19/18 1923 11/19/18 2020 11/20/18 0651 11/20/18 0907  NA 138 136 136 137 136 136  K 4.9 5.0 5.0 5.4* 5.4* 5.4*  CL 103 102  --  101 101  --   CO2 21* 24  --  23 23  --   GLUCOSE 245* 223*  --  274*  247*  --   BUN 38* 33*  --  34* 39*  --   CREATININE 1.45* 1.29*  --  1.24* 1.35*  --   CALCIUM 8.9 8.7*  --  8.8* 8.6*  --   MG  --   --   --  2.2 2.1  --   PHOS  --   --   --  5.5* 4.1  --    GFR: Estimated Creatinine Clearance: 47.6 mL/min (A) (by C-G formula based on SCr of 1.35 mg/dL (H)). Recent Labs  Lab 11/24/2018 2000 11/19/18 0558 11/20/18 0651  WBC 10.7* 10.1 16.6*    Liver Function Tests: Recent Labs  Lab 11/13/2018 2000 11/19/18 0558 11/19/18 2020 11/20/18 0651  AST 45* 49* 71* 52*  ALT 34 30 44 41  ALKPHOS 90 80 101 96  BILITOT 0.7 0.7 0.8 0.5  PROT 7.2 7.1 7.2 7.0  ALBUMIN 2.7* 2.8* 2.7* 2.6*   No results for input(s): LIPASE, AMYLASE in the last 168 hours. No results for input(s): AMMONIA in the last 168 hours.  ABG    Component Value Date/Time   PHART 7.318 (L) 11/20/2018 0907   PCO2ART 45.3 11/20/2018 0907   PO2ART 70.0 (L) 11/20/2018 0907   HCO3 23.3 11/20/2018 0907   TCO2 25 11/20/2018 0907   ACIDBASEDEF 3.0 (H) 11/20/2018 0907   O2SAT 92.0 11/20/2018 0907     Coagulation Profile: No results for input(s): INR, PROTIME in the last 168 hours.  Cardiac Enzymes: No results for input(s): CKTOTAL, CKMB, CKMBINDEX, TROPONINI in the last 168 hours.  HbA1C: Hgb A1c MFr Bld  Date/Time Value Ref Range Status  11/19/2018 08:00 PM 7.0 (H) 4.8 - 5.6 % Final    Comment:    (NOTE) Pre diabetes:          5.7%-6.4% Diabetes:              >6.4% Glycemic control for   <7.0% adults with diabetes   10/18/2016 10:47 AM 6.0 (H) 4.8 - 5.6 % Final    Comment:    (NOTE)         Pre-diabetes: 5.7 - 6.4         Diabetes: >6.4         Glycemic control for adults with diabetes: <7.0     CBG: Recent Labs  Lab 11/19/18 1208 11/19/18 1649 11/19/18 2257 11/20/18 0417 11/20/18 0748  GLUCAP 299* 213* 283* 237* 243*      Critical care time: 40 minutes   Roselie Awkward, MD [place] PCCM Pager: [phone] Cell: [phone] If no response, call  [phone]

## 2018-11-20 NOTE — Consult Note (Signed)
   [facility] CM Inpatient Consult   11/20/2018  [name] [name] Aug 31, 1950 [id number]   Patient is currently active with [facility] for chronic disease management services.  Patient has been engaged by a [facility] for COPD chronic disease management.  Patient has a history with [facility] Social Worker in the distant past.   Our community based plan of care has focused on disease management and community resource support.   Chart has been briefly reviewed and parts of progress HPI noted from history and physical noted as follows: [name] [name]  is a 68 y.o. female, with history of morbid obesity, DM type II, COPD not on oxygen, hypertension, CKD 3 baseline creatinine around 1.4, chronic atrial fibrillation [place] vas 2 score of at least 3 on Xarelto and Cardizem, spine surgeries in the past, chronic fluid retention, history of Graves' disease now hypothyroid, history of CVA, history of DVT. She was having low-grade fevers body aches and fatigue for several days and started developing some shortness of breath around 4 to 5 days ago, yesterday she presented herself to [facility] ER for shortness of breath and was diagnosed with COVID-19 pneumonitis causing acute hypoxic respiratory failure, ARF, elevated CRP and ferritin.  She was then transferred here for further care, came about 20 hours later due to lack of transportation. Patient is currently intubated on a vent.  Plan: Notified Inpatient TOC team member to make aware that [facility] following. Continue to follow progress and disposition and needs.  Of note, [facility] Care Management services does not replace or interfere with any services that are needed or arranged by inpatient [facility] care management team.  For additional questions or referrals please contact:  Natividad Brood, RN BSN [facility] Liaison  [phone] business mobile phone Toll free office [phone]  Fax number: [phone]  [name]

## 2018-11-20 NOTE — Progress Notes (Addendum)
Initial Nutrition Assessment RD working remotely.  DOCUMENTATION CODES:   Morbid obesity  INTERVENTION:    Vital AF 1.2 at 40 ml/h (960 ml per day)   Pro-stat 60 ml BID   MVI daily   Provides 1552 kcal, 132 gm protein, 779 ml free water daily  NUTRITION DIAGNOSIS:   Inadequate oral intake related to inability to eat as evidenced by NPO status.  GOAL:   Provide needs based on ASPEN/SCCM guidelines  MONITOR:   Vent status, Labs, Skin, TF tolerance  REASON FOR ASSESSMENT:   Ventilator, Consult Enteral/tube feeding initiation and management  ASSESSMENT:   68 yo female admitted with severe respiratory failure r/t COVID-19 PNA. PMH includes DM-2, obesity, asthma, CKD stage 3, chronic A fib, stroke, DVT.  Patient required intubation 7/23 due to increased work of breathing.  Received MD Consult for TF initiation and management. OGT in place. Starting proning today.  Patient is currently intubated on ventilator support MV: 9.4 L/min Temp (24hrs), Avg:97.7 F (36.5 C), Min:95.7 F (35.4 C), Max:99 F (37.2 C)    Labs reviewed. Potassium 5.4 (H), BUN 39 (H), creatinine 1.35 (H) CBG's: 243-217  Medications reviewed and include levophed, mag sulfatevitamin D3, decadron, ferrous sulfate, novolog, levemir, vitamin C, zinc.  NUTRITION - FOCUSED PHYSICAL EXAM:  unable to complete, working remotely  Diet Order:   Diet Order            Diet Carb Modified Fluid consistency: Thin; Room service appropriate? Yes  Diet effective now              EDUCATION NEEDS:   Not appropriate for education at this time  Skin:  Skin Assessment: Reviewed RN Assessment  Last BM:  7/22  Height:   Ht Readings from Last 1 Encounters:  11/07/2018 5\' 3"  (1.6 m)    Weight:   Wt Readings from Last 1 Encounters:  11/20/18 110.3 kg    Ideal Body Weight:  52.3 kg  BMI:  Body mass index is 43.08 kg/m.  Estimated Nutritional Needs:   Kcal:  1700-1749  Protein:   110-130 gm  Fluid:  >/= 1.6 L    Molli Barrows, RD, LDN, [place] Pager [phone] After Hours Pager [phone]

## 2018-11-20 NOTE — Progress Notes (Signed)
PT Cancellation Note  Patient Details Name: [name] [name] MRN: [id number] DOB: April 14, 1951   Cancelled Treatment:    Reason Eval/Treat Not Completed: Patient not medically ready. Per RN, pt intubated yesterday evening and remains sedated. Will follow-up for PT evaluation on Monday.  Mabeline Caras, PT, DPT Acute Rehabilitation Services  Pager [phone] Office [place] 11/20/2018, 7:56 AM

## 2018-11-20 NOTE — Progress Notes (Signed)
PROGRESS NOTE  [name] [name]:[id number] DOB: 1950/08/31 DOA: 11/09/2018  PCP: Nicholos Johns, MD  Brief History/Interval Summary: 68 y.o. female, with history of morbid obesity, DM type II, COPD not on oxygen, hypertension, CKD 3 baseline creatinine around 1.4, chronic atrial fibrillation [place] vas 2 score of at least 3 on Xarelto and Cardizem, spine surgeries in the past, chronic fluid retention, history of Graves' disease now hypothyroid, history of CVA, history of DVT who lives at [facility] and was likely exposed to COVID-19 infection about 10 to 14 days prior to this admission.    She was experiencing fever, body aches and developed shortness of breath.  She finally decided to come into the emergency department at [facility].  She was found to be positive for COVID-19.  Was noted to have hypoxia.  She was hospitalized for further management.  Reason for Visit: Acute respiratory failure with hypoxia due to COVID-19  Consultants: Pulmonology  Procedures: None  Antibiotics: Anti-infectives (From admission, onward)   Start     Dose/Rate Route Frequency Ordered Stop   11/19/18 2030  remdesivir 100 mg in sodium chloride 0.9 % 250 mL IVPB     100 mg 500 mL/hr over 30 Minutes Intravenous Every 24 hours 10/28/2018 1855 11/23/18 2029   11/26/2018 2030  remdesivir 200 mg in sodium chloride 0.9 % 250 mL IVPB     200 mg 500 mL/hr over 30 Minutes Intravenous Once 11/07/2018 1855 11/08/2018 2056       Subjective/Interval History: Patient was intubated yesterday afternoon.  She is intubated and sedated.      Assessment/Plan:  Acute Hypoxic Resp. Failure due to Acute Covid 19 Viral Illness  COVID-19 Labs  Recent Labs    11/22/2018 2000 11/19/18 0558 11/20/18 0651  DDIMER 0.57* 0.52* 2.06*  FERRITIN 1,209* 1,461* 2,016*  LDH  --  441* 518*  CRP 17.4* 16.2* 13.0*    Lab Results  Component Value Date   SARSCOV2NAA POSITIVE (A) 11/09/2018     Fever: Afebrile Oxygen  requirements: Now on mechanical ventilation.  100% FiO2.  Saturating in the 90s.   Antibiotics: None Remdesivir: Day 3 today Steroids: Dexamethasone 6 mg every 12 hours Diuretics: Not on diuretics on a scheduled basis Actemra: Has received 2 doses, 7/22 and 7/23 Vitamin C and Zinc: Continue DVT Prophylaxis: Patient is on rivaroxaban  Patient had to be intubated yesterday.  Now on mechanical ventilation.  Pulmonology is following and managing.  CRP has improved to 13.0.  LDH 518.  Ferritin 2016.  D-dimer 2.06.  Patient remains on Remdesivir and steroids.  She has received 2 doses of Actemra.  She is already on anticoagulation with rivaroxaban.  The treatment plan and use of medications and known side effects were discussed with patient.  Some of the medications used are based on case reports/anecdotal data which are not peer-reviewed and has not been studied using randomized control trials.  Complete risks and long-term side effects are unknown, however in the best clinical judgment they seem to be of some benefit.  Patient agrees with the treatment plan and want to receive these treatments as indicated.  Patient given Actemra and steroids.  Acute renal failure on chronic kidney disease stage III Renal function is now at her baseline.  Potassium noted to be elevated.  We will give her Lokelma.  Monitor urine output which is somewhat on the lower side.  CVP is around 7.  Patient is on pressors.  No diuretics for today.  We will reassess her tomorrow.  Patient has chronic lower extremity edema right greater than left.  She does take diuretics at home she was placed on hold at admission.  ARB is on hold.    History of chronic atrial fibrillation/episodes of V. tach Chads 2 vascular score is at least 3.  As she is requiring pressors we will hold her Cardizem.  Currently in sinus rhythm.  Apparently had some V. tach overnight after she was intubated.  Will order echocardiogram correct her potassium.   Magnesium 2.1.  EKG.  Troponin.  TSH noted to be low.  We will check free T4.  History of asthma versus COPD Continue her home medications.  She is not on home oxygen.  Hypothyroidism Continue levothyroxine.  History of stroke Patient is on Xarelto and aspirin.  This is being continued as she came into the hospital on these medications.  Not noted to be on statin.  Reason for this is not entirely clear.  Check lipid panel.  Will discuss with patient.  History of vitamin D deficiency Continue supplementation.  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  Stable.  Diabetes mellitus type 2 CBGs elevated due to steroids.  She is on SSI.  She was started on Levemir yesterday.  CBGs poorly controlled.  We will adjust the dose of Levemir.  HbA1c 7.0.    Morbid obesity BMI 42.43.  DVT Prophylaxis: Patient is on rivaroxaban PUD Prophylaxis: On Protonix Code Status: Full code Family Communication: Discussed with her daughter yesterday. Disposition Plan: She will remain in the ICU.   Medications:  Scheduled: . artificial tears  1 application Both Eyes R4B  . aspirin EC  81 mg Oral Daily  . chlorhexidine gluconate (MEDLINE KIT)  15 mL Mouth Rinse BID  . cholecalciferol  2,000 Units Oral Daily  . dexamethasone (DECADRON) injection  6 mg Intravenous Q12H  . diltiazem  120 mg Oral Daily  . feeding supplement (PRO-STAT SUGAR FREE 64)  30 mL Per Tube BID  . feeding supplement (VITAL HIGH PROTEIN)  1,000 mL Per Tube Q24H  . fentaNYL (SUBLIMAZE) injection  25 mcg Intravenous Once  . ferrous sulfate  300 mg Per Tube TID WC  . fluticasone furoate-vilanterol  1 puff Inhalation Daily  . gabapentin  100 mg Per Tube Q8H  . insulin aspart  0-20 Units Subcutaneous Q4H  . insulin detemir  0.075 Units/kg Subcutaneous BID  . levothyroxine  112 mcg Oral Q0600  . mouth rinse  15 mL Mouth  Rinse BID  . mouth rinse  15 mL Mouth Rinse 10 times per day  . pantoprazole  40 mg Oral Daily  . rivaroxaban  20 mg Oral Q supper  . vitamin C  500 mg Oral Daily  . zinc sulfate  220 mg Oral Daily   Continuous: . amiodarone Stopped (11/20/18 0032)  . fentaNYL infusion INTRAVENOUS 300 mcg/hr (11/20/18 0944)  . magnesium sulfate bolus IVPB    . midazolam 3.5 mg/hr (11/20/18 0621)  . midazolam    . norepinephrine (LEVOPHED) Adult infusion 6 mcg/min (11/20/18 0600)  . remdesivir 100 mg in NS 250 mL Stopped (11/20/18 0048)   ULA:GTXMIWOEHOZYY **OR** acetaminophen, albuterol, bisacodyl, diltiazem, docusate sodium, fentaNYL, midazolam, midazolam, traZODone, vecuronium   Objective:  Vital Signs  Vitals:   11/20/18 0700 11/20/18 0725 11/20/18 0800 11/20/18 0900  BP: 121/68 121/68 117/63 119/66  Pulse: (!) 52 (!) 51 (!)  55 (!) 56  Resp: 15 (!) 30 17   Temp:   98.1 F (36.7 C)   TempSrc:   Oral   SpO2: (!) 89% 92% 95% 96%  Weight:      Height:        Intake/Output Summary (Last 24 hours) at 11/20/2018 1040 Last data filed at 11/20/2018 0600 Gross per 24 hour  Intake 1193.94 ml  Output 355 ml  Net 838.94 ml   Filed Weights   11/11/2018 1800 11/19/18 0500 11/20/18 0150  Weight: 109.4 kg 108.6 kg 110.3 kg    General appearance: Intubated and sedated Resp: Coarse breath sounds bilaterally.  Crackles bilateral bases.  No wheezing or rhonchi. Cardio: S1-S2 is normal regular.  No S3-S4.  No rubs murmurs or bruit GI: Abdomen is soft.  Nontender nondistended.  Bowel sounds are present normal.  No masses organomegaly Extremities: Chronic lower extremity edema right greater than left.  Hyperpigmentation left lower extremity. Neurologic: She is sedated.    Lab Results:  Data Reviewed: I have personally reviewed following labs and imaging studies  CBC: Recent Labs  Lab 10/30/2018 2000 11/19/18 0558 11/19/18 1923 11/20/18 0651 11/20/18 0907  WBC 10.7* 10.1  --  16.6*  --    NEUTROABS  --  8.8*  --  13.9*  --   HGB 12.8 12.6 13.9 12.8 13.6  HCT 40.7 39.6 41.0 42.1 40.0  MCV 85.3 86.1  --  87.9  --   PLT 138* 136*  --  186  --     Basic Metabolic Panel: Recent Labs  Lab 11/09/2018 2000 11/19/18 0558 11/19/18 1923 11/19/18 2020 11/20/18 0651 11/20/18 0907  NA 138 136 136 137 136 136  K 4.9 5.0 5.0 5.4* 5.4* 5.4*  CL 103 102  --  101 101  --   CO2 21* 24  --  23 23  --   GLUCOSE 245* 223*  --  274* 247*  --   BUN 38* 33*  --  34* 39*  --   CREATININE 1.45* 1.29*  --  1.24* 1.35*  --   CALCIUM 8.9 8.7*  --  8.8* 8.6*  --   MG  --   --   --  2.2 2.1  --   PHOS  --   --   --  5.5* 4.1  --     GFR: Estimated Creatinine Clearance: 47.6 mL/min (A) (by C-G formula based on SCr of 1.35 mg/dL (H)).  Liver Function Tests: Recent Labs  Lab 11/13/2018 2000 11/19/18 0558 11/19/18 2020 11/20/18 0651  AST 45* 49* 71* 52*  ALT 34 30 44 41  ALKPHOS 90 80 101 96  BILITOT 0.7 0.7 0.8 0.5  PROT 7.2 7.1 7.2 7.0  ALBUMIN 2.7* 2.8* 2.7* 2.6*    HbA1C: Recent Labs    11/05/2018 2000  HGBA1C 7.0*    CBG: Recent Labs  Lab 11/19/18 1208 11/19/18 1649 11/19/18 2257 11/20/18 0417 11/20/18 0748  GLUCAP 299* 213* 283* 237* 243*    Thyroid Function Tests: Recent Labs    11/20/18 0652  TSH <0.010*    Anemia Panel: Recent Labs    11/19/18 0558 11/20/18 0651  FERRITIN 1,461* 2,016*    Recent Results (from the past 240 hour(s))  SARS Coronavirus 2 (CEPHEID- Performed in [place] hospital lab), Hosp Order     Status: Abnormal   Collection Time: 11/22/2018  8:29 PM   Specimen: Nasopharyngeal Swab  Result Value Ref Range Status   SARS  Coronavirus 2 POSITIVE (A) NEGATIVE Final    Comment: CRITICAL RESULT CALLED TO, READ BACK BY AND VERIFIED WITH: RN P HOWARD AT 0106 11/19/18 CRUICKSHANK A (NOTE) If result is NEGATIVE SARS-CoV-2 target nucleic acids are NOT DETECTED. The SARS-CoV-2 RNA is generally detectable in upper and lower  respiratory  specimens during the acute phase of infection. The lowest  concentration of SARS-CoV-2 viral copies this assay can detect is 250  copies / mL. A negative result does not preclude SARS-CoV-2 infection  and should not be used as the sole basis for treatment or other  patient management decisions.  A negative result may occur with  improper specimen collection / handling, submission of specimen other  than nasopharyngeal swab, presence of viral mutation(s) within the  areas targeted by this assay, and inadequate number of viral copies  (<250 copies / mL). A negative result must be combined with clinical  observations, patient history, and epidemiological information. If result is POSITIVE SARS-CoV-2 target nucleic acids a re DETECTED. The SARS-CoV-2 RNA is generally detectable in upper and lower  respiratory specimens during the acute phase of infection.  Positive  results are indicative of active infection with SARS-CoV-2.  Clinical  correlation with patient history and other diagnostic information is  necessary to determine patient infection status.  Positive results do  not rule out bacterial infection or co-infection with other viruses. If result is PRESUMPTIVE POSTIVE SARS-CoV-2 nucleic acids MAY BE PRESENT.   A presumptive positive result was obtained on the submitted specimen  and confirmed on repeat testing.  While 2019 novel coronavirus  (SARS-CoV-2) nucleic acids may be present in the submitted sample  additional confirmatory testing may be necessary for epidemiological  and / or clinical management purposes  to differentiate between  SARS-CoV-2 and other Sarbecovirus currently known to infect humans.  If clinically indicated additional testing with an alternate test  methodolo gy [phone]) is advised. The SARS-CoV-2 RNA is generally  detectable in upper and lower respiratory specimens during the acute  phase of infection. The expected result is Negative. Fact Sheet for  Patients:  StrictlyIdeas.no Fact Sheet for Healthcare Providers: BankingDealers.co.za This test is not yet approved or cleared by the [place] FDA and has been authorized for detection and/or diagnosis of SARS-CoV-2 by FDA under an Emergency Use Authorization (EUA).  This EUA will remain in effect (meaning this test can be used) for the duration of the COVID-19 declaration under Section 564(b)(1) of the Act, 21 U.S.C. section 360bbb-3(b)(1), unless the authorization is terminated or revoked sooner. Performed at [facility], [place] [address] [place], [place] [address]   MRSA PCR Screening     Status: None   Collection Time: 10/30/2018 11:15 PM   Specimen: Nasal Mucosa; Nasopharyngeal  Result Value Ref Range Status   MRSA by PCR NEGATIVE NEGATIVE Final    Comment:        The GeneXpert MRSA Assay (FDA approved for NASAL specimens only), is one component of a comprehensive MRSA colonization surveillance program. It is not intended to diagnose MRSA infection nor to guide or monitor treatment for MRSA infections. Performed at [facility], [place] [address] [place], [place] [address]       Radiology Studies: Dg Abd 1 View  Result Date: 11/19/2018 CLINICAL DATA:  Check gastric catheter EXAM: ABDOMEN - 1 VIEW COMPARISON:  None. FINDINGS: Gastric catheter is noted within the distal aspect of the stomach. Scattered large and small bowel gas is noted. No other  focal abnormality is seen. IMPRESSION: Gastric catheter within the distal stomach. Electronically Signed   By: Inez Catalina M.D.   On: 11/19/2018 19:44   Dg Chest Port 1 View  Result Date: 11/19/2018 CLINICAL DATA:  Endotracheal tube placement and central line placement. EXAM: PORTABLE CHEST 1 VIEW COMPARISON:  Chest radiograph performed 11/10/2018 FINDINGS: The patient's endotracheal tube is seen ending 2 cm above the carina. An enteric tube is  noted extending overlying the body of the stomach. A left IJ line is noted ending about the proximal SVC. The lungs remain hypoexpanded. Patchy bilateral perihilar and left basilar airspace opacification is concerning for pneumonia. No definite pleural effusion or pneumothorax is seen. The cardiomediastinal silhouette is borderline enlarged. No acute osseous abnormalities are seen. IMPRESSION: 1. Endotracheal tube seen ending 2 cm above the carina. 2. Enteric tube noted extending overlying the body of the stomach. 3. Left IJ line ending about the proximal SVC. 4. Lungs hypoexpanded. Patchy bilateral perihilar and left basilar airspace opacification are concerning for pneumonia. 5. Borderline cardiomegaly. Electronically Signed   By: Garald Balding M.D.   On: 11/19/2018 19:44   Dg Chest Port 1 View  Result Date: 11/13/2018 CLINICAL DATA:  COVID-19 positive, shortness of breath EXAM: PORTABLE CHEST 1 VIEW COMPARISON:  Portable exam 1935 hours compared to 11/17/2018 FINDINGS: Based on multiple prior exams, current study is mislabeled LEFT versus RIGHT and is re-labeled. Chronic elevation of RIGHT diaphragm. Normal heart size and mediastinal contours. Patchy infiltrates LEFT lung. No pleural effusion or pneumothorax. IMPRESSION: Patchy LEFT lung infiltrates. Chronic elevation of RIGHT diaphragm. Electronically Signed   By: Lavonia Dana M.D.   On: 11/15/2018 20:08       LOS: 2 days   [facility] Pager on [email or web address]  11/20/2018, 10:40 AM

## 2018-11-20 NOTE — Patient Outreach (Signed)
[place] [facility]) Care Management  11/20/2018  SHATHA HOOSER [birth year] [id number]   RN Health Coach Monthly Outreach  Referral Date:12/11/2017 Referral Source:EMMI Prevent Screening Reason for Referral:Disease Management Education Insurance:[employer] Medicare   Outreach Attempt:  Received notification patient has been admitted to hospital positive for COVID 19.  Hospital Liaison is aware and following patient for discharge needs.  Plan:  RN Health Coach will await Hospital Liaisons recommendations.  [place] [phone] [email or web address]

## 2018-11-21 ENCOUNTER — Inpatient Hospital Stay (HOSPITAL_COMMUNITY): Payer: Medicare Other

## 2018-11-21 DIAGNOSIS — J1289 Other viral pneumonia: Secondary | ICD-10-CM

## 2018-11-21 LAB — COMPREHENSIVE METABOLIC PANEL
ALT: 35 U/L (ref 0–44)
AST: 36 U/L (ref 15–41)
Albumin: 2.5 g/dL — ABNORMAL LOW (ref 3.5–5.0)
Alkaline Phosphatase: 91 U/L (ref 38–126)
Anion gap: 9 (ref 5–15)
BUN: 49 mg/dL — ABNORMAL HIGH (ref 8–23)
CO2: 24 mmol/L (ref 22–32)
Calcium: 8.4 mg/dL — ABNORMAL LOW (ref 8.9–10.3)
Chloride: 105 mmol/L (ref 98–111)
Creatinine, Ser: 1.57 mg/dL — ABNORMAL HIGH (ref 0.44–1.00)
GFR calc Af Amer: 39 mL/min — ABNORMAL LOW (ref >60)
GFR calc non Af Amer: 34 mL/min — ABNORMAL LOW (ref >60)
Glucose, Bld: 322 mg/dL — ABNORMAL HIGH (ref 70–99)
Potassium: 5 mmol/L (ref 3.5–5.1)
Sodium: 138 mmol/L (ref 135–145)
Total Bilirubin: 0.4 mg/dL (ref 0.3–1.2)
Total Protein: 6.4 g/dL — ABNORMAL LOW (ref 6.5–8.1)

## 2018-11-21 LAB — CBC WITH DIFFERENTIAL/PLATELET
Abs Immature Granulocytes: 0.39 10*3/uL — ABNORMAL HIGH (ref 0.00–0.07)
Basophils Absolute: 0 10*3/uL (ref 0.0–0.1)
Basophils Relative: 0 %
Eosinophils Absolute: 0 10*3/uL (ref 0.0–0.5)
Eosinophils Relative: 0 %
HCT: 41.1 % (ref 36.0–46.0)
Hemoglobin: 12.7 g/dL (ref 12.0–15.0)
Immature Granulocytes: 2 %
Lymphocytes Relative: 5 %
Lymphs Abs: 0.8 10*3/uL (ref 0.7–4.0)
MCH: 27.6 pg (ref 26.0–34.0)
MCHC: 30.9 g/dL (ref 30.0–36.0)
MCV: 89.3 fL (ref 80.0–100.0)
Monocytes Absolute: 0.6 10*3/uL (ref 0.1–1.0)
Monocytes Relative: 3 %
Neutro Abs: 14.8 10*3/uL — ABNORMAL HIGH (ref 1.7–7.7)
Neutrophils Relative %: 90 %
Platelets: 179 10*3/uL (ref 150–400)
RBC: 4.6 MIL/uL (ref 3.87–5.11)
RDW: 13.2 % (ref 11.5–15.5)
WBC: 16.6 10*3/uL — ABNORMAL HIGH (ref 4.0–10.5)
nRBC: 0.2 % (ref 0.0–0.2)

## 2018-11-21 LAB — LACTATE DEHYDROGENASE: LDH: 479 U/L — ABNORMAL HIGH (ref 98–192)

## 2018-11-21 LAB — GLUCOSE, CAPILLARY
Glucose-Capillary: 218 mg/dL — ABNORMAL HIGH (ref 70–99)
Glucose-Capillary: 222 mg/dL — ABNORMAL HIGH (ref 70–99)
Glucose-Capillary: 227 mg/dL — ABNORMAL HIGH (ref 70–99)
Glucose-Capillary: 262 mg/dL — ABNORMAL HIGH (ref 70–99)
Glucose-Capillary: 287 mg/dL — ABNORMAL HIGH (ref 70–99)
Glucose-Capillary: 298 mg/dL — ABNORMAL HIGH (ref 70–99)

## 2018-11-21 LAB — POCT I-STAT 7, (LYTES, BLD GAS, ICA,H+H)
Acid-base deficit: 3 mmol/L — ABNORMAL HIGH (ref 0.0–2.0)
Bicarbonate: 24.5 mmol/L (ref 20.0–28.0)
Calcium, Ion: 1.29 mmol/L (ref 1.15–1.40)
HCT: 39 % (ref 36.0–46.0)
Hemoglobin: 13.3 g/dL (ref 12.0–15.0)
O2 Saturation: 78 %
Patient temperature: 98.6
Potassium: 4.7 mmol/L (ref 3.5–5.1)
Sodium: 138 mmol/L (ref 135–145)
TCO2: 26 mmol/L (ref 22–32)
pCO2 arterial: 53.6 mmHg — ABNORMAL HIGH (ref 32.0–48.0)
pH, Arterial: 7.268 — ABNORMAL LOW (ref 7.350–7.450)
pO2, Arterial: 49 mmHg — ABNORMAL LOW (ref 83.0–108.0)

## 2018-11-21 LAB — D-DIMER, QUANTITATIVE: D-Dimer, Quant: 3.75 ug/mL-FEU — ABNORMAL HIGH (ref 0.00–0.50)

## 2018-11-21 LAB — PHOSPHORUS
Phosphorus: 3.9 mg/dL (ref 2.5–4.6)
Phosphorus: 4.2 mg/dL (ref 2.5–4.6)

## 2018-11-21 LAB — MAGNESIUM
Magnesium: 2.2 mg/dL (ref 1.7–2.4)
Magnesium: 2.3 mg/dL (ref 1.7–2.4)

## 2018-11-21 LAB — T4, FREE: Free T4: 1 ng/dL (ref 0.61–1.12)

## 2018-11-21 LAB — C-REACTIVE PROTEIN: CRP: 9.4 mg/dL — ABNORMAL HIGH (ref <1.0)

## 2018-11-21 LAB — FERRITIN: Ferritin: 1186 ng/mL — ABNORMAL HIGH (ref 11–307)

## 2018-11-21 MED ORDER — FUROSEMIDE 10 MG/ML IJ SOLN
40.0000 mg | Freq: Once | INTRAMUSCULAR | Status: AC
  Administered 2018-11-21: 12:00:00 40 mg via INTRAVENOUS
  Filled 2018-11-21: qty 4

## 2018-11-21 MED ORDER — PANTOPRAZOLE SODIUM 40 MG PO PACK
40.0000 mg | PACK | Freq: Every day | ORAL | Status: DC
  Administered 2018-11-21 – 2018-11-25 (×5): 40 mg
  Filled 2018-11-21 (×5): qty 20

## 2018-11-21 MED ORDER — INSULIN DETEMIR 100 UNIT/ML ~~LOC~~ SOLN
24.0000 [IU] | Freq: Two times a day (BID) | SUBCUTANEOUS | Status: DC
  Administered 2018-11-21 – 2018-11-22 (×3): 24 [IU] via SUBCUTANEOUS
  Filled 2018-11-21 (×3): qty 0.24

## 2018-11-21 NOTE — Progress Notes (Signed)
Spoke with daughter [name] with update on her mother. Connected via facetime. She was appreciative and prayed with her mom. emotional support provided.

## 2018-11-21 NOTE — Progress Notes (Signed)
NAME:  [name] [name], MRN:  [id number], DOB:  October 17, 1950, LOS: 3 ADMISSION DATE:  11/08/2018, CONSULTATION DATE: July 23 REFERRING MD: Dr. Candiss Norse, CHIEF COMPLAINT: Dyspnea  Brief History   68 year old female admitted for severe acute respiratory failure with hypoxemia due to COVID-19 pneumonia.  Past Medical History  Diabetes mellitus type 2 Obesity Asthma, has been on inhalers in the past, never smoked cigarettes CKD stage III Chronic A. fib History of spine surgery History of Graves' disease, now hypothyroid History of stroke History of DVT Lives in an assisted living facility [facility] Events   July 22 admitted July 23 intubated July 24 prone positioning  Consults:  PCCM  Procedures:  7/23 ETT > 7/23 L IJ CVL >  Significant Diagnostic Tests:  7/24 Echo > LVEF > 65%, impaired relaxation, RV normal systolic function, mild thickening of aortic valve  Micro Data:  July 22 SARS COV 2 Positive  Antimicrobials:  July 22 remdesivir July 22 decadron July 22> 23 Toclizumab   Interim history/subjective:   Oxygenation improved while in prone position, otherwise no major changes Some ventilatory dyssynchrony while in supine position  Objective   Blood pressure 114/65, pulse (!) 53, temperature 98 F (36.7 C), temperature source Rectal, resp. rate (!) 35, height 5\' 3"  (1.6 m), weight 110.3 kg, SpO2 93 %. CVP:  [7 mmHg-13 mmHg] 12 mmHg  Vent Mode: PRVC FiO2 (%):  [50 %-100 %] 60 % Set Rate:  [30 bmp-35 bmp] 35 bmp Vt Set:  [310 mL] 310 mL PEEP:  [12 cmH20-14 cmH20] 12 cmH20 Plateau Pressure:  [22 cmH20-31 cmH20] 25 cmH20   Intake/Output Summary (Last 24 hours) at 11/21/2018 0746 Last data filed at 11/21/2018 0000 Gross per 24 hour  Intake 1423.73 ml  Output 420 ml  Net 1003.73 ml   Filed Weights   11/24/2018 1800 11/19/18 0500 11/20/18 0150  Weight: 109.4 kg 108.6 kg 110.3 kg    Examination:  General:  In bed on vent HENT: NCAT ETT in  place PULM: CTA B, vent supported breathing CV: RRR, no mgr GI: BS+, soft, nontender MSK: normal bulk and tone Neuro: sedated on vent  7/25 CXR > pending   Resolved Hospital Problem list     Assessment & Plan:  Severe acute respiratory failure with hypoxemia secondary to ARDS from COVID pneumonia: oxygenation worse on 7/25 Continue mechanical ventilation per ARDS protocol Target TVol 6-8cc/kgIBW Target Plateau Pressure < 30cm H20 Target driving pressure less than 15 cm of water Target PaO2 55-65: titrate PEEP/FiO2 per protocol As long as PaO2 to FiO2 ratio is less than 1:150 position in prone position for 16 hours a day Check CVP daily if CVL in place Target CVP less than 4, diurese as necessary Ventilator associated pneumonia prevention protocol 7/25 plan: resume prone positioning, diurese, CXR  VT 7/23 PM: has not recurred, unclear cause; electrolytes OK, Echo OK Tele Stop amiodarone with bradycardia K>4, Mg >2  Need for sedation for ventilator dyssynchrony: Continue neuromuscular blockade protocol, intermittent dosing protocol Fentanyl, Versed infusions titrated to RA SS score of -4 to -5  COVID-19 pneumonia: Continue decadron, remdesivir  History of DVT Xarelto   Best practice:  Diet: start tube feeding Pain/Anxiety/Delirium protocol (if indicated): RASS score -4 to -5 VAP protocol (if indicated): yes DVT prophylaxis: Xarelto GI prophylaxis: protonix Glucose control: per TRH Mobility: bed rest Code Status: full Family Communication: I tried to call her family, could not reach them 7/25, left a message Disposition: remain in  ICU  Labs   CBC: Recent Labs  Lab 11/06/2018 2000 11/19/18 0558  11/20/18 7408 11/20/18 0907 11/20/18 1337 11/20/18 1708 11/21/18 0410 11/21/18 0628  WBC 10.7* 10.1  --  16.6*  --   --   --  16.6*  --   NEUTROABS  --  8.8*  --  13.9*  --   --   --  14.8*  --   HGB 12.8 12.6   < > 12.8 13.6 13.3 13.6 12.7 13.3  HCT 40.7 39.6    < > 42.1 40.0 39.0 40.0 41.1 39.0  MCV 85.3 86.1  --  87.9  --   --   --  89.3  --   PLT 138* 136*  --  186  --   --   --  179  --    < > = values in this interval not displayed.    Basic Metabolic Panel: Recent Labs  Lab 11/12/2018 2000 11/19/18 0558  11/19/18 2020 11/20/18 1448 11/20/18 0907 11/20/18 1337 11/20/18 1611 11/20/18 1708 11/21/18 0410 11/21/18 0628  NA 138 136   < > 137 136 136 137  --  138 138 138  K 4.9 5.0   < > 5.4* 5.4* 5.4* 5.4*  --  5.5* 5.0 4.7  CL 103 102  --  101 101  --   --   --   --  105  --   CO2 21* 24  --  23 23  --   --   --   --  24  --   GLUCOSE 245* 223*  --  274* 247*  --   --   --   --  322*  --   BUN 38* 33*  --  34* 39*  --   --   --   --  49*  --   CREATININE 1.45* 1.29*  --  1.24* 1.35*  --   --   --   --  1.57*  --   CALCIUM 8.9 8.7*  --  8.8* 8.6*  --   --   --   --  8.4*  --   MG  --   --   --  2.2 2.1  --   --  2.2  --  2.2  --   PHOS  --   --   --  5.5* 4.1  --   --  5.0*  --  3.9  --    < > = values in this interval not displayed.   GFR: Estimated Creatinine Clearance: 40.9 mL/min (A) (by C-G formula based on SCr of 1.57 mg/dL (H)). Recent Labs  Lab 11/25/2018 2000 11/19/18 0558 11/20/18 0651 11/21/18 0410  WBC 10.7* 10.1 16.6* 16.6*    Liver Function Tests: Recent Labs  Lab 11/25/2018 2000 11/19/18 0558 11/19/18 2020 11/20/18 0651 11/21/18 0410  AST 45* 49* 71* 52* 36  ALT 34 30 44 41 35  ALKPHOS 90 80 101 96 91  BILITOT 0.7 0.7 0.8 0.5 0.4  PROT 7.2 7.1 7.2 7.0 6.4*  ALBUMIN 2.7* 2.8* 2.7* 2.6* 2.5*   No results for input(s): LIPASE, AMYLASE in the last 168 hours. No results for input(s): AMMONIA in the last 168 hours.  ABG    Component Value Date/Time   PHART 7.268 (L) 11/21/2018 0628   PCO2ART 53.6 (H) 11/21/2018 0628   PO2ART 49.0 (L) 11/21/2018 0628   HCO3 24.5 11/21/2018 0628   TCO2 26 11/21/2018 1856  ACIDBASEDEF 3.0 (H) 11/21/2018 0628   O2SAT 78.0 11/21/2018 0628     Coagulation Profile: No  results for input(s): INR, PROTIME in the last 168 hours.  Cardiac Enzymes: No results for input(s): CKTOTAL, CKMB, CKMBINDEX, TROPONINI in the last 168 hours.  HbA1C: Hgb A1c MFr Bld  Date/Time Value Ref Range Status  11/09/2018 08:00 PM 7.0 (H) 4.8 - 5.6 % Final    Comment:    (NOTE) Pre diabetes:          5.7%-6.4% Diabetes:              >6.4% Glycemic control for   <7.0% adults with diabetes   10/18/2016 10:47 AM 6.0 (H) 4.8 - 5.6 % Final    Comment:    (NOTE)         Pre-diabetes: 5.7 - 6.4         Diabetes: >6.4         Glycemic control for adults with diabetes: <7.0     CBG: Recent Labs  Lab 11/20/18 1205 11/20/18 1615 11/20/18 2044 11/21/18 0015 11/21/18 0437  GLUCAP 217* 200* 221* 287* 298*      Critical care time: 35 minutes   Roselie Awkward, MD [facility] Pager: [phone] Cell: [phone] If no response, call [phone]

## 2018-11-21 NOTE — Progress Notes (Signed)
Spoke with daughter Marciano Sequin. Gave update. Daughter appreciative of care and communication.

## 2018-11-21 NOTE — Progress Notes (Addendum)
PROGRESS NOTE  TAQWA DEEM IRW:[id number] DOB: Sep 02, 1950 DOA: 11/22/2018  PCP: Nicholos Johns, MD  Brief History/Interval Summary: 68 y.o. female, with history of morbid obesity, DM type II, COPD not on oxygen, hypertension, CKD 3 baseline creatinine around 1.4, chronic atrial fibrillation [place] vas 2 score of at least 3 on Xarelto and Cardizem, spine surgeries in the past, chronic fluid retention, history of Graves' disease now hypothyroid, history of CVA, history of DVT who lives at [facility] and was likely exposed to COVID-19 infection about 10 to 14 days prior to this admission.    She was experiencing fever, body aches and developed shortness of breath.  She finally decided to come into the emergency department at [facility].  She was found to be positive for COVID-19.  Was noted to have hypoxia.  She was hospitalized for further management.  Reason for Visit: Acute respiratory failure with hypoxia due to COVID-19  Consultants: Pulmonology  Procedures:   Transthoracic echocardiogram 7/24 1. The left ventricle has hyperdynamic systolic function, with an ejection fraction of >65%. The cavity size was normal. Left ventricular diastolic Doppler parameters are consistent with impaired relaxation.  2. The right ventricle has normal systolc function. The cavity was normal. There is no increase in right ventricular wall thickness. Right ventricular systolic pressure is normal.  3. Mild thickening of the aortic valve. No stenosis of the aortic valve.  4. The aortic root and ascending aorta are normal in size and structure.    Antibiotics: Anti-infectives (From admission, onward)   Start     Dose/Rate Route Frequency Ordered Stop   11/19/18 2030  remdesivir 100 mg in sodium chloride 0.9 % 250 mL IVPB     100 mg 500 mL/hr over 30 Minutes Intravenous Every 24 hours 10/28/2018 1855 11/23/18 2029   11/03/2018 2030  remdesivir 200 mg in sodium chloride 0.9 % 250 mL IVPB     200 mg  500 mL/hr over 30 Minutes Intravenous Once 11/15/2018 1855 11/23/2018 2056       Subjective/Interval History: Patient remains intubated and sedated.      Assessment/Plan:  Acute Hypoxic Resp. Failure due to Acute Covid 19 Viral Illness  COVID-19 Labs  Recent Labs    11/19/18 0558 11/20/18 0651 11/21/18 0410  DDIMER 0.52* 2.06* 3.75*  FERRITIN 1,461* 2,016* 1,186*  LDH 441* 518* 479*  CRP 16.2* 13.0* 9.4*    Lab Results  Component Value Date   SARSCOV2NAA POSITIVE (A) 11/13/2018     Fever: Afebrile Oxygen requirements: On mechanical ventilation.  80% FiO2.  Saturating in the 90s.    Antibacterials: None Remdesivir: Day 4 today Steroids: Dexamethasone 6 mg every 12 hours Diuretics: Not on diuretics on a scheduled basis.  She will be given 1 dose of Lasix today. Actemra: Has received 2 doses, 7/22 and 7/23 Vitamin C and Zinc: Continue DVT Prophylaxis: Patient is on rivaroxaban  Patient remains intubated and on mechanical ventilation.  Pulmonology is following and managing.  From a COVID-19 standpoint patient remains on Remdesivir and steroids.  She has received 2 doses of Actemra.  She is anticoagulated with rivaroxaban.  Inflammatory markers show improvement with CRP down to 9.4.  LDH 1186.  D-dimer is elevated at 3.75.  Leukocytosis is most likely due to steroids.  Acute renal failure on chronic kidney disease stage III Baseline renal function not entirely clear.  There is a creatinine measurement from 2017 of 1.28.  Creatinine was 0.9 in August 2018.  Creatinine has been  somewhat elevated here in the hospital.  Her BUN is noted to be elevated.  Urine output now accurately charted.  She did make 1200 mL of urine over the last 24 hours.  Her CVP is around 10.  We will give her 1 dose of Lasix today.  Monitor BUN and creatinine closely.  Potassium is improved with Lokelma.  She has chronic lower extremity edema, right greater than left.  She does take diuretics at home which  was placed on hold at admission.  ARB is on hold.    History of chronic atrial fibrillation/episodes of V. tach Chads 2 vascular score is at least 3.  Cardizem on hold due to hypotension.  Patient was also placed on amiodarone infusion due to rhythm issues.  Apparently had V. tach 2 days ago.  Due to bradycardia amiodarone has been held.  TSH was noted to be less than 0.1.  Free T4 is pending.  High-sensitivity troponin I was 81 and then 74.  Not significantly elevated.  Echocardiogram normal systolic function.  Diastolic dysfunction was noted.  No significant valvular abnormalities.  History of asthma versus COPD Continue her home medications.  She is not on home oxygen.  Hypothyroidism Continue levothyroxine.  As noted above her TSH was noted to be low.  Free T4 is pending.  History of stroke Patient is on Xarelto and aspirin.  This is being continued as she came into the hospital on these medications.  Not noted to be on statin.  Reason for this is not entirely clear.  This can be addressed when she is over her acute illness.  History of vitamin D deficiency Continue supplementation.  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  Stable.  Diabetes mellitus type 2 with hyperglycemia CBGs elevated due to steroids.  She was started on Levemir 2 days ago.  Dose to be adjusted today.  HbA1c 7.0.  She is also on SSI.     Morbid obesity BMI 42.43.  DVT Prophylaxis: Patient is on rivaroxaban PUD Prophylaxis: On Protonix Code Status: Full code Family Communication: PCCM has been calling family Disposition Plan: She will remain in the ICU.   Medications:  Scheduled: . artificial tears  1 application Both Eyes [id number]  . aspirin EC  81 mg Oral Daily  . chlorhexidine gluconate (MEDLINE KIT)  15 mL Mouth Rinse BID  . cholecalciferol  2,000 Units Oral Daily  . dexamethasone  (DECADRON) injection  6 mg Intravenous Q12H  . feeding supplement (PRO-STAT SUGAR FREE 64)  30 mL Per Tube QID  . feeding supplement (VITAL AF 1.2 CAL)  1,000 mL Per Tube Q24H  . ferrous sulfate  300 mg Per Tube TID WC  . fluticasone furoate-vilanterol  1 puff Inhalation Daily  . gabapentin  100 mg Per Tube Q8H  . insulin aspart  0-20 Units Subcutaneous Q4H  . insulin detemir  24 Units Subcutaneous BID  . levothyroxine  112 mcg Oral Q0600  . mouth rinse  15 mL Mouth Rinse 10 times per day  . multivitamin with minerals  1 tablet Per Tube Daily  . pantoprazole  40 mg Oral Daily  . rivaroxaban  20 mg Oral Q supper  . vitamin C  500 mg Oral Daily  . zinc sulfate  220 mg Oral Daily   Continuous: . sodium chloride Stopped (11/21/18 0325)  . amiodarone 30 mg/hr (11/21/18 0600)  . fentaNYL infusion INTRAVENOUS  200 mcg/hr (11/21/18 0900)  . magnesium sulfate bolus IVPB    . midazolam Stopped (11/20/18 1218)  . midazolam 8 mg/hr (11/21/18 0800)  . norepinephrine (LEVOPHED) Adult infusion 6 mcg/min (11/21/18 0800)  . remdesivir 100 mg in NS 250 mL Stopped (11/20/18 2234)   HWE:XHBZJI chloride, acetaminophen **OR** acetaminophen, albuterol, bisacodyl, diltiazem, docusate sodium, fentaNYL, midazolam, midazolam, traZODone, vecuronium   Objective:  Vital Signs  Vitals:   11/21/18 0800 11/21/18 0815 11/21/18 0830 11/21/18 0900  BP: (!) 112/59 114/63 113/60 115/69  Pulse: (!) 49 (!) 48 (!) 48 (!) 49  Resp:      Temp: 98 F (36.7 C)     TempSrc: Oral     SpO2: 99% 99% 98% 96%  Weight:      Height:        Intake/Output Summary (Last 24 hours) at 11/21/2018 0957 Last data filed at 11/21/2018 0900 Gross per 24 hour  Intake 2280.11 ml  Output 1620 ml  Net 660.11 ml   Filed Weights   11/19/18 0500 11/20/18 0150 11/21/18 0500  Weight: 108.6 kg 110.3 kg 112.1 kg    General appearance: Intubated and sedated Resp: Crackles heard bilaterally.  No wheezing or rhonchi.   Cardio: S1-S2  bradycardic regular.  Telemetry shows sinus bradycardia GI: Abdomen is soft.  Nontender nondistended.  Bowel sounds are present normal.  No masses organomegaly Extremities: Chronic lower extremity edema.  Right greater than left.  Hyperpigmentation left lower extremity. Neurologic: She is intubated and sedated.  No obvious deficits.    Lab Results:  Data Reviewed: I have personally reviewed following labs and imaging studies  CBC: Recent Labs  Lab 11/16/2018 2000 11/19/18 0558  11/20/18 0651 11/20/18 0907 11/20/18 1337 11/20/18 1708 11/21/18 0410 11/21/18 0628  WBC 10.7* 10.1  --  16.6*  --   --   --  16.6*  --   NEUTROABS  --  8.8*  --  13.9*  --   --   --  14.8*  --   HGB 12.8 12.6   < > 12.8 13.6 13.3 13.6 12.7 13.3  HCT 40.7 39.6   < > 42.1 40.0 39.0 40.0 41.1 39.0  MCV 85.3 86.1  --  87.9  --   --   --  89.3  --   PLT 138* 136*  --  186  --   --   --  179  --    < > = values in this interval not displayed.    Basic Metabolic Panel: Recent Labs  Lab 11/21/2018 2000 11/19/18 0558  11/19/18 2020 11/20/18 9678 11/20/18 0907 11/20/18 1337 11/20/18 1611 11/20/18 1708 11/21/18 0410 11/21/18 0628  NA 138 136   < > 137 136 136 137  --  138 138 138  K 4.9 5.0   < > 5.4* 5.4* 5.4* 5.4*  --  5.5* 5.0 4.7  CL 103 102  --  101 101  --   --   --   --  105  --   CO2 21* 24  --  23 23  --   --   --   --  24  --   GLUCOSE 245* 223*  --  274* 247*  --   --   --   --  322*  --   BUN 38* 33*  --  34* 39*  --   --   --   --  49*  --   CREATININE 1.45* 1.29*  --  1.24* 1.35*  --   --   --   --  1.57*  --   CALCIUM 8.9 8.7*  --  8.8* 8.6*  --   --   --   --  8.4*  --   MG  --   --   --  2.2 2.1  --   --  2.2  --  2.2  --   PHOS  --   --   --  5.5* 4.1  --   --  5.0*  --  3.9  --    < > = values in this interval not displayed.    GFR: Estimated Creatinine Clearance: 41.3 mL/min (A) (by C-G formula based on SCr of 1.57 mg/dL (H)).  Liver Function Tests: Recent Labs  Lab 11/23/2018  2000 11/19/18 0558 11/19/18 2020 11/20/18 0651 11/21/18 0410  AST 45* 49* 71* 52* 36  ALT 34 30 44 41 35  ALKPHOS 90 80 101 96 91  BILITOT 0.7 0.7 0.8 0.5 0.4  PROT 7.2 7.1 7.2 7.0 6.4*  ALBUMIN 2.7* 2.8* 2.7* 2.6* 2.5*    HbA1C: Recent Labs    10/29/2018 2000  HGBA1C 7.0*    CBG: Recent Labs  Lab 11/20/18 1615 11/20/18 2044 11/21/18 0015 11/21/18 0437 11/21/18 0838  GLUCAP 200* 221* 287* 298* 262*    Thyroid Function Tests: Recent Labs    11/20/18 0652  TSH <0.010*    Anemia Panel: Recent Labs    11/20/18 0651 11/21/18 0410  FERRITIN 2,016* 1,186*    Recent Results (from the past 240 hour(s))  SARS Coronavirus 2 (CEPHEID- Performed in [place] hospital lab), Hosp Order     Status: Abnormal   Collection Time: 11/19/2018  8:29 PM   Specimen: Nasopharyngeal Swab  Result Value Ref Range Status   SARS Coronavirus 2 POSITIVE (A) NEGATIVE Final    Comment: CRITICAL RESULT CALLED TO, READ BACK BY AND VERIFIED WITH: RN P HOWARD AT 0106 11/19/18 CRUICKSHANK A (NOTE) If result is NEGATIVE SARS-CoV-2 target nucleic acids are NOT DETECTED. The SARS-CoV-2 RNA is generally detectable in upper and lower  respiratory specimens during the acute phase of infection. The lowest  concentration of SARS-CoV-2 viral copies this assay can detect is 250  copies / mL. A negative result does not preclude SARS-CoV-2 infection  and should not be used as the sole basis for treatment or other  patient management decisions.  A negative result may occur with  improper specimen collection / handling, submission of specimen other  than nasopharyngeal swab, presence of viral mutation(s) within the  areas targeted by this assay, and inadequate number of viral copies  (<250 copies / mL). A negative result must be combined with clinical  observations, patient history, and epidemiological information. If result is POSITIVE SARS-CoV-2 target nucleic acids a re DETECTED. The SARS-CoV-2 RNA  is generally detectable in upper and lower  respiratory specimens during the acute phase of infection.  Positive  results are indicative of active infection with SARS-CoV-2.  Clinical  correlation with patient history and other diagnostic information is  necessary to determine patient infection status.  Positive results do  not rule out bacterial infection or co-infection with other viruses. If result is PRESUMPTIVE POSTIVE SARS-CoV-2 nucleic acids MAY BE PRESENT.   A presumptive positive result was obtained on the submitted specimen  and confirmed on repeat testing.  While 2019 novel coronavirus  (SARS-CoV-2) nucleic acids may be present in the submitted sample  additional confirmatory testing  may be necessary for epidemiological  and / or clinical management purposes  to differentiate between  SARS-CoV-2 and other Sarbecovirus currently known to infect humans.  If clinically indicated additional testing with an alternate test  methodolo gy [phone] is advised. The SARS-CoV-2 RNA is generally  detectable in upper and lower respiratory specimens during the acute  phase of infection. The expected result is Negative. Fact Sheet for Patients:  StrictlyIdeas.no Fact Sheet for Healthcare Providers: BankingDealers.co.za This test is not yet approved or cleared by the [place] FDA and has been authorized for detection and/or diagnosis of SARS-CoV-2 by FDA under an Emergency Use Authorization (EUA).  This EUA will remain in effect (meaning this test can be used) for the duration of the COVID-19 declaration under Section 564(b)(1) of the Act, 21 U.S.C. section 360bbb-3(b)(1), unless the authorization is terminated or revoked sooner. Performed at [facility], [place] [address] [place], [place] [address]   MRSA PCR Screening     Status: None   Collection Time: 11/03/2018 11:15 PM   Specimen: Nasal Mucosa; Nasopharyngeal   Result Value Ref Range Status   MRSA by PCR NEGATIVE NEGATIVE Final    Comment:        The GeneXpert MRSA Assay (FDA approved for NASAL specimens only), is one component of a comprehensive MRSA colonization surveillance program. It is not intended to diagnose MRSA infection nor to guide or monitor treatment for MRSA infections. Performed at [facility], [place] [address], [place], [place] [address]       Radiology Studies: Dg Abd 1 View  Result Date: 11/19/2018 CLINICAL DATA:  Check gastric catheter EXAM: ABDOMEN - 1 VIEW COMPARISON:  None. FINDINGS: Gastric catheter is noted within the distal aspect of the stomach. Scattered large and small bowel gas is noted. No other focal abnormality is seen. IMPRESSION: Gastric catheter within the distal stomach. Electronically Signed   By: Inez Catalina M.D.   On: 11/19/2018 19:44   Dg Chest Port 1 View  Result Date: 11/19/2018 CLINICAL DATA:  Endotracheal tube placement and central line placement. EXAM: PORTABLE CHEST 1 VIEW COMPARISON:  Chest radiograph performed 11/10/2018 FINDINGS: The patient's endotracheal tube is seen ending 2 cm above the carina. An enteric tube is noted extending overlying the body of the stomach. A left IJ line is noted ending about the proximal SVC. The lungs remain hypoexpanded. Patchy bilateral perihilar and left basilar airspace opacification is concerning for pneumonia. No definite pleural effusion or pneumothorax is seen. The cardiomediastinal silhouette is borderline enlarged. No acute osseous abnormalities are seen. IMPRESSION: 1. Endotracheal tube seen ending 2 cm above the carina. 2. Enteric tube noted extending overlying the body of the stomach. 3. Left IJ line ending about the proximal SVC. 4. Lungs hypoexpanded. Patchy bilateral perihilar and left basilar airspace opacification are concerning for pneumonia. 5. Borderline cardiomegaly. Electronically Signed   By: Garald Balding M.D.   On:  11/19/2018 19:44       LOS: 3 days   [facility] Pager on [email or web address]  11/21/2018, 9:57 AM

## 2018-11-22 ENCOUNTER — Inpatient Hospital Stay (HOSPITAL_COMMUNITY): Payer: Medicare Other

## 2018-11-22 LAB — COMPREHENSIVE METABOLIC PANEL
ALT: 33 U/L (ref 0–44)
AST: 27 U/L (ref 15–41)
Albumin: 2.5 g/dL — ABNORMAL LOW (ref 3.5–5.0)
Alkaline Phosphatase: 88 U/L (ref 38–126)
Anion gap: 9 (ref 5–15)
BUN: 57 mg/dL — ABNORMAL HIGH (ref 8–23)
CO2: 28 mmol/L (ref 22–32)
Calcium: 8.5 mg/dL — ABNORMAL LOW (ref 8.9–10.3)
Chloride: 105 mmol/L (ref 98–111)
Creatinine, Ser: 1.51 mg/dL — ABNORMAL HIGH (ref 0.44–1.00)
GFR calc Af Amer: 41 mL/min — ABNORMAL LOW (ref >60)
GFR calc non Af Amer: 35 mL/min — ABNORMAL LOW (ref >60)
Glucose, Bld: 287 mg/dL — ABNORMAL HIGH (ref 70–99)
Potassium: 4.8 mmol/L (ref 3.5–5.1)
Sodium: 142 mmol/L (ref 135–145)
Total Bilirubin: 0.5 mg/dL (ref 0.3–1.2)
Total Protein: 6.4 g/dL — ABNORMAL LOW (ref 6.5–8.1)

## 2018-11-22 LAB — GLUCOSE, CAPILLARY
Glucose-Capillary: 236 mg/dL — ABNORMAL HIGH (ref 70–99)
Glucose-Capillary: 246 mg/dL — ABNORMAL HIGH (ref 70–99)
Glucose-Capillary: 252 mg/dL — ABNORMAL HIGH (ref 70–99)
Glucose-Capillary: 255 mg/dL — ABNORMAL HIGH (ref 70–99)
Glucose-Capillary: 303 mg/dL — ABNORMAL HIGH (ref 70–99)
Glucose-Capillary: 348 mg/dL — ABNORMAL HIGH (ref 70–99)

## 2018-11-22 LAB — C-REACTIVE PROTEIN: CRP: 6.9 mg/dL — ABNORMAL HIGH (ref <1.0)

## 2018-11-22 LAB — CBC WITH DIFFERENTIAL/PLATELET
Abs Immature Granulocytes: 0.47 10*3/uL — ABNORMAL HIGH (ref 0.00–0.07)
Basophils Absolute: 0 10*3/uL (ref 0.0–0.1)
Basophils Relative: 0 %
Eosinophils Absolute: 0 10*3/uL (ref 0.0–0.5)
Eosinophils Relative: 0 %
HCT: 42.8 % (ref 36.0–46.0)
Hemoglobin: 12.9 g/dL (ref 12.0–15.0)
Immature Granulocytes: 3 %
Lymphocytes Relative: 3 %
Lymphs Abs: 0.5 10*3/uL — ABNORMAL LOW (ref 0.7–4.0)
MCH: 26.7 pg (ref 26.0–34.0)
MCHC: 30.1 g/dL (ref 30.0–36.0)
MCV: 88.4 fL (ref 80.0–100.0)
Monocytes Absolute: 0.7 10*3/uL (ref 0.1–1.0)
Monocytes Relative: 4 %
Neutro Abs: 15.7 10*3/uL — ABNORMAL HIGH (ref 1.7–7.7)
Neutrophils Relative %: 90 %
Platelets: 179 10*3/uL (ref 150–400)
RBC: 4.84 MIL/uL (ref 3.87–5.11)
RDW: 13 % (ref 11.5–15.5)
WBC: 17.4 10*3/uL — ABNORMAL HIGH (ref 4.0–10.5)
nRBC: 0.2 % (ref 0.0–0.2)

## 2018-11-22 LAB — FERRITIN: Ferritin: 855 ng/mL — ABNORMAL HIGH (ref 11–307)

## 2018-11-22 LAB — POCT I-STAT 7, (LYTES, BLD GAS, ICA,H+H)
Bicarbonate: 27.9 mmol/L (ref 20.0–28.0)
Calcium, Ion: 1.26 mmol/L (ref 1.15–1.40)
HCT: 40 % (ref 36.0–46.0)
Hemoglobin: 13.6 g/dL (ref 12.0–15.0)
O2 Saturation: 93 %
Patient temperature: 97.5
Potassium: 4.9 mmol/L (ref 3.5–5.1)
Sodium: 139 mmol/L (ref 135–145)
TCO2: 30 mmol/L (ref 22–32)
pCO2 arterial: 57.4 mmHg — ABNORMAL HIGH (ref 32.0–48.0)
pH, Arterial: 7.292 — ABNORMAL LOW (ref 7.350–7.450)
pO2, Arterial: 75 mmHg — ABNORMAL LOW (ref 83.0–108.0)

## 2018-11-22 LAB — LACTATE DEHYDROGENASE: LDH: 435 U/L — ABNORMAL HIGH (ref 98–192)

## 2018-11-22 LAB — D-DIMER, QUANTITATIVE: D-Dimer, Quant: 10.92 ug/mL-FEU — ABNORMAL HIGH (ref 0.00–0.50)

## 2018-11-22 MED ORDER — FUROSEMIDE 10 MG/ML IJ SOLN
40.0000 mg | Freq: Once | INTRAMUSCULAR | Status: AC
  Administered 2018-11-22: 13:00:00 40 mg via INTRAVENOUS
  Filled 2018-11-22: qty 4

## 2018-11-22 MED ORDER — AMIODARONE LOAD VIA INFUSION
150.0000 mg | Freq: Once | INTRAVENOUS | Status: AC
  Administered 2018-11-22: 18:00:00 150 mg via INTRAVENOUS
  Filled 2018-11-22: qty 83.34

## 2018-11-22 MED ORDER — AMIODARONE HCL IN DEXTROSE 360-4.14 MG/200ML-% IV SOLN
30.0000 mg/h | INTRAVENOUS | Status: DC
  Administered 2018-11-23 – 2018-11-24 (×4): 30 mg/h via INTRAVENOUS
  Filled 2018-11-22 (×3): qty 200

## 2018-11-22 MED ORDER — POLYETHYLENE GLYCOL 3350 17 G PO PACK
17.0000 g | PACK | Freq: Every day | ORAL | Status: DC
  Administered 2018-11-22 – 2018-11-25 (×4): 17 g via ORAL
  Filled 2018-11-22 (×4): qty 1

## 2018-11-22 MED ORDER — INSULIN DETEMIR 100 UNIT/ML ~~LOC~~ SOLN
30.0000 [IU] | Freq: Two times a day (BID) | SUBCUTANEOUS | Status: DC
  Administered 2018-11-22 – 2018-11-23 (×3): 30 [IU] via SUBCUTANEOUS
  Filled 2018-11-22 (×5): qty 0.3

## 2018-11-22 MED ORDER — SENNOSIDES 8.8 MG/5ML PO SYRP
10.0000 mL | ORAL_SOLUTION | Freq: Every day | ORAL | Status: DC | PRN
  Administered 2018-11-25: 09:00:00 10 mL via ORAL
  Filled 2018-11-22: qty 10

## 2018-11-22 MED ORDER — AMIODARONE HCL IN DEXTROSE 360-4.14 MG/200ML-% IV SOLN
60.0000 mg/h | INTRAVENOUS | Status: AC
  Administered 2018-11-22: 60 mg/h via INTRAVENOUS
  Filled 2018-11-22 (×2): qty 200

## 2018-11-22 NOTE — Progress Notes (Signed)
[facility] PCCM  Afib RVR, ordered amiodarone bolus and infusion again  Roselie Awkward, MD [place] Pager: [phone] Cell: [phone] If no response, call [phone]

## 2018-11-22 NOTE — Progress Notes (Addendum)
eLink Physician-Brief Progress Note Patient Name: [name] [name] DOB: October 27, 1950 MRN: [id number]   Date of Service  11/22/2018  HPI/Events of Note  On bedside RN assessment not of crepitus in the right chest area AC 35/360/50%/12 PEEP, peak pressure 26 O2 sat 93-94%  eICU Interventions   CXR ordered  If confirmed subcutaneous emphysema plan to decrease PEEP further   CXR reviewed, Subcutaneous emphysema confirmed. PEEP decreased to 10 from 12. Will need chest tube as patient persistently in afib RVR despite amiodarone. Bedside MD aware. Discussed with bedside MD Dr Bridgett Larsson. Patient is on Xarelto. Patient has been in and out of afib. No large pneumothorax noted. Plan is to hold Xarelto and repeat CXR in am. Hold off on chest tube insertion for now given increased risk of bleed without clear sign of tension pneumothorax.   Intervention Category Major Interventions: Other:  Judd Lien 11/22/2018, 10:15 PM

## 2018-11-22 NOTE — Progress Notes (Signed)
NAME:  [name] [name], MRN:  [id number], DOB:  02-Dec-1950, LOS: 4 ADMISSION DATE:  10/28/2018, CONSULTATION DATE: July 23 REFERRING MD: Dr. Candiss Norse, CHIEF COMPLAINT: Dyspnea  Brief History   68 year old female admitted for severe acute respiratory failure with hypoxemia due to COVID-19 pneumonia.  Past Medical History  Diabetes mellitus type 2 Obesity Asthma, has been on inhalers in the past, never smoked cigarettes CKD stage III Chronic A. fib History of spine surgery History of Graves' disease, now hypothyroid History of stroke History of DVT Lives in an assisted living facility [facility] Events   July 22 admitted July 23 intubated July 24 prone positioning  Consults:  PCCM  Procedures:  7/23 ETT > 7/23 L IJ CVL >  Significant Diagnostic Tests:  7/24 Echo > LVEF > 65%, impaired relaxation, RV normal systolic function, mild thickening of aortic valve  Micro Data:  July 22 SARS COV 2 Positive  Antimicrobials:  July 22 remdesivir July 22 decadron July 22> 23 Toclizumab   Interim history/subjective:   Oxygenation better today Was in prone positioning overnight Diuresed well yesterday  Objective   Blood pressure 125/71, pulse 71, temperature 98.4 F (36.9 C), temperature source Rectal, resp. rate (!) 35, height 5\' 3"  (1.6 m), weight 112.1 kg, SpO2 94 %. CVP:  [8 mmHg-13 mmHg] 10 mmHg  Vent Mode: PRVC FiO2 (%):  [55 %-80 %] 55 % Set Rate:  [35 bmp] 35 bmp Vt Set:  [360 mL] 360 mL PEEP:  [12 cmH20-14 cmH20] 14 cmH20 Plateau Pressure:  [22 cmH20-30 cmH20] 27 cmH20   Intake/Output Summary (Last 24 hours) at 11/22/2018 0741 Last data filed at 11/22/2018 0600 Gross per 24 hour  Intake 1835.93 ml  Output 2900 ml  Net -1064.07 ml   Filed Weights   11/19/18 0500 11/20/18 0150 11/21/18 0500  Weight: 108.6 kg 110.3 kg 112.1 kg    Examination:  General:  In bed on vent, prone positioning HENT: NCAT ETT in place PULM: CTA B, vent supported  breathing CV: RRR, no mgr GI: BS+, soft, nontender MSK: normal bulk and tone Neuro: sedated on vent   7/25 CXR > images independently reviewed showing low lung volumes, airspace disease left base worse than right, endotracheal tube in place   Resolved Hospital Problem list     Assessment & Plan:  Severe acute respiratory failure with hypoxemia secondary to ARDS from COVID pneumonia: oxygenation improved 7/26 post diuresis Continue mechanical ventilation per ARDS protocol Target TVol 6-8cc/kgIBW Target Plateau Pressure < 30cm H20 Target driving pressure less than 15 cm of water Target PaO2 55-65: titrate PEEP/FiO2 per protocol As long as PaO2 to FiO2 ratio is less than 1:150 position in prone position for 16 hours a day Check CVP daily if CVL in place Target CVP less than 4, diurese as necessary Ventilator associated pneumonia prevention protocol 7/26 plan: continue diuresis again, repeat ABG 1800, decide then about prone positioning again   VT 7/23 PM: has not recurred, unclear cause; electrolytes OK, Echo OK Tele Monitor off amiodarone  Need for sedation for ventilator dyssynchrony: For today continue neuromuscular blockade protocol, intermittent dosing protocol, target RA SS score -4 to -5; if oxygenation continues to improve by July 27 we will stop neuromuscular blockade protocol  COVID-19 pneumonia: Continue decadron, remdesivir  History of DVT Xarelto   Best practice:  Diet: start tube feeding Pain/Anxiety/Delirium protocol (if indicated): RASS score -4 to -5 VAP protocol (if indicated): yes DVT prophylaxis: Xarelto GI prophylaxis: protonix Glucose  control: per TRH Mobility: bed rest Code Status: full Family Communication: was able to reach family eventually for phone call on 7/25, left a message again on 7/26 Disposition: remain in ICU  Labs   CBC: Recent Labs  Lab 11/07/2018 2000 11/19/18 0558  11/20/18 0651  11/20/18 1337 11/20/18 1708 11/21/18 0410  11/21/18 0628 11/22/18 0550  WBC 10.7* 10.1  --  16.6*  --   --   --  16.6*  --  17.4*  NEUTROABS  --  8.8*  --  13.9*  --   --   --  14.8*  --  15.7*  HGB 12.8 12.6   < > 12.8   < > 13.3 13.6 12.7 13.3 12.9  HCT 40.7 39.6   < > 42.1   < > 39.0 40.0 41.1 39.0 42.8  MCV 85.3 86.1  --  87.9  --   --   --  89.3  --  88.4  PLT 138* 136*  --  186  --   --   --  179  --  179   < > = values in this interval not displayed.    Basic Metabolic Panel: Recent Labs  Lab 11/19/18 0558  11/19/18 2020 11/20/18 0651  11/20/18 1337 11/20/18 1611 11/20/18 1708 11/21/18 0410 11/21/18 0628 11/21/18 1630 11/22/18 0550  NA 136   < > 137 136   < > 137  --  138 138 138  --  142  K 5.0   < > 5.4* 5.4*   < > 5.4*  --  5.5* 5.0 4.7  --  4.8  CL 102  --  101 101  --   --   --   --  105  --   --  105  CO2 24  --  23 23  --   --   --   --  24  --   --  28  GLUCOSE 223*  --  274* 247*  --   --   --   --  322*  --   --  287*  BUN 33*  --  34* 39*  --   --   --   --  49*  --   --  57*  CREATININE 1.29*  --  1.24* 1.35*  --   --   --   --  1.57*  --   --  1.51*  CALCIUM 8.7*  --  8.8* 8.6*  --   --   --   --  8.4*  --   --  8.5*  MG  --   --  2.2 2.1  --   --  2.2  --  2.2  --  2.3  --   PHOS  --   --  5.5* 4.1  --   --  5.0*  --  3.9  --  4.2  --    < > = values in this interval not displayed.   GFR: Estimated Creatinine Clearance: 43 mL/min (A) (by C-G formula based on SCr of 1.51 mg/dL (H)). Recent Labs  Lab 11/19/18 0558 11/20/18 0651 11/21/18 0410 11/22/18 0550  WBC 10.1 16.6* 16.6* 17.4*    Liver Function Tests: Recent Labs  Lab 11/19/18 0558 11/19/18 2020 11/20/18 0651 11/21/18 0410 11/22/18 0550  AST 49* 71* 52* 36 27  ALT 30 44 41 35 33  ALKPHOS 80 101 96 91 88  BILITOT 0.7 0.8 0.5 0.4 0.5  PROT 7.1  7.2 7.0 6.4* 6.4*  ALBUMIN 2.8* 2.7* 2.6* 2.5* 2.5*   No results for input(s): LIPASE, AMYLASE in the last 168 hours. No results for input(s): AMMONIA in the last 168 hours.  ABG     Component Value Date/Time   PHART 7.268 (L) 11/21/2018 0628   PCO2ART 53.6 (H) 11/21/2018 0628   PO2ART 49.0 (L) 11/21/2018 0628   HCO3 24.5 11/21/2018 0628   TCO2 26 11/21/2018 0628   ACIDBASEDEF 3.0 (H) 11/21/2018 0628   O2SAT 78.0 11/21/2018 0628     Coagulation Profile: No results for input(s): INR, PROTIME in the last 168 hours.  Cardiac Enzymes: No results for input(s): CKTOTAL, CKMB, CKMBINDEX, TROPONINI in the last 168 hours.  HbA1C: Hgb A1c MFr Bld  Date/Time Value Ref Range Status  11/10/2018 08:00 PM 7.0 (H) 4.8 - 5.6 % Final    Comment:    (NOTE) Pre diabetes:          5.7%-6.4% Diabetes:              >6.4% Glycemic control for   <7.0% adults with diabetes   10/18/2016 10:47 AM 6.0 (H) 4.8 - 5.6 % Final    Comment:    (NOTE)         Pre-diabetes: 5.7 - 6.4         Diabetes: >6.4         Glycemic control for adults with diabetes: <7.0     CBG: Recent Labs  Lab 11/21/18 1226 11/21/18 1650 11/21/18 1945 11/22/18 0008 11/22/18 0454  GLUCAP 227* 222* 218* 236* 255*      Critical care time: 32 minutes   Roselie Awkward, MD [place] [facility] Pager: [phone] Cell: [phone] If no response, call [phone]

## 2018-11-22 NOTE — Progress Notes (Signed)
PROGRESS NOTE  [name] [name] KKX:[id number] DOB: 1951-02-14 DOA: 11/25/2018  PCP: Nicholos Johns, MD  Brief History/Interval Summary: 68 y.o. female, with history of morbid obesity, DM type II, COPD not on oxygen, hypertension, CKD 3 baseline creatinine around 1.4, chronic atrial fibrillation Mali vas 2 score of at least 3 on Xarelto and Cardizem, spine surgeries in the past, chronic fluid retention, history of Graves' disease now hypothyroid, history of CVA, history of DVT who lives at [facility] and was likely exposed to COVID-19 infection about 10 to 14 days prior to this admission.    She was experiencing fever, body aches and developed shortness of breath.  She finally decided to come into the emergency department at [facility].  She was found to be positive for COVID-19.  Was noted to have hypoxia.  She was hospitalized for further management.  Reason for Visit: Acute respiratory failure with hypoxia due to COVID-19  Consultants: Pulmonology  Procedures:   Transthoracic echocardiogram 7/24 1. The left ventricle has hyperdynamic systolic function, with an ejection fraction of >65%. The cavity size was normal. Left ventricular diastolic Doppler parameters are consistent with impaired relaxation.  2. The right ventricle has normal systolc function. The cavity was normal. There is no increase in right ventricular wall thickness. Right ventricular systolic pressure is normal.  3. Mild thickening of the aortic valve. No stenosis of the aortic valve.  4. The aortic root and ascending aorta are normal in size and structure.    Antibiotics: Anti-infectives (From admission, onward)   Start     Dose/Rate Route Frequency Ordered Stop   11/19/18 2030  remdesivir 100 mg in sodium chloride 0.9 % 250 mL IVPB     100 mg 500 mL/hr over 30 Minutes Intravenous Every 24 hours 11/25/2018 1855 11/23/18 2029   11/05/2018 2030  remdesivir 200 mg in sodium chloride 0.9 % 250 mL IVPB     200  mg 500 mL/hr over 30 Minutes Intravenous Once 11/26/2018 1855 11/08/2018 2056       Subjective/Interval History: Patient remains intubated sedated.  Patient is in prone position this morning.    Assessment/Plan:  Acute Hypoxic Resp. Failure due to Acute Covid 19 Viral Illness  COVID-19 Labs  Recent Labs    11/20/18 0651 11/21/18 0410 11/22/18 0550  DDIMER 2.06* 3.75* 10.92*  FERRITIN 2,016* 1,186* 855*  LDH 518* 479* 435*  CRP 13.0* 9.4* 6.9*    Lab Results  Component Value Date   SARSCOV2NAA POSITIVE (A) 11/08/2018     Fever: No fever noted. Oxygen requirements: Mechanical ventilation.  50% FiO2.  Saturating in the 90s.  Antibacterials: None Remdesivir: Day 5 today Steroids: Dexamethasone 6 mg every 12 hours.  We will start tapering from tomorrow. Diuretics: Given Lasix x1 on 7/25.  She will be given additional dose today.   Actemra: Has received 2 doses, 7/22 and 7/23 Vitamin C and Zinc: Continue DVT Prophylaxis: Patient is on rivaroxaban  Patient remains intubated and on mechanical ventilation.  Pulmonology is following and managing.  She is in prone position this morning.  From a COVID-19 standpoint patient remains stable.  Afebrile.  She remains on Remdesivir and steroids.  We will start tapering down steroids from tomorrow.  Patient has received 2 doses of Actemra.  She is currently anticoagulated with rivaroxaban.  Inflammatory markers have improved.  CRP down to 6.9.  Ferritin 855.  D-dimer has gone up to 10.92.  Leukocytosis is most likely due to steroids.  Continue to  monitor.  Acute renal failure on chronic kidney disease stage III Baseline renal function not entirely known.  There is a creatinine measurement from 2017 of 1.28.  Creatinine was 0.9 in August 2018.  Creatinine has been somewhat elevated here in the hospital.  Her BUN is noted to be elevated.  Brisk diuresis with Lasix yesterday.  Will give additional dose today.  Will need to monitor BUN and  creatinine closely.  Potassium is 4.8 today.  She was given Lokelma 2 days ago.  She has chronic lower extremity edema, right greater than left.  She does take diuretics at home which was placed on hold at admission.  ARB is on hold.    History of chronic atrial fibrillation/episodes of V. tach Chads 2 vascular score is at least 3.  Cardizem on hold due to hypotension.  Patient was also placed on amiodarone infusion due to rhythm issues.  She had episodes of V. tach after she was intubated.  She was started on amiodarone infusion.  No further episodes of V. tach.  Amiodarone was held due to bradycardia.  Okay to hold it.  High-sensitivity troponin I was 81 and then 74.  Not significantly elevated.  Echocardiogram normal systolic function.  Diastolic dysfunction was noted.  No significant valvular abnormalities.  History of asthma versus COPD Continue her home medications.  She is not on home oxygen.  Hypothyroidism Continue levothyroxine.  As noted above her TSH was noted to be low.  Free T4 is 1.0.  No further work-up.  Low TSH likely sick euthyroid.  History of stroke Patient is on Xarelto and aspirin.  This is being continued as she came into the hospital on these medications.  Not noted to be on statin.  Reason for this is not entirely clear.  This can be addressed when she is over her acute illness.  History of vitamin D deficiency Continue supplementation.  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  Stable.  Diabetes mellitus type 2 with hyperglycemia CBGs elevated due to steroids.  And SSI.  HbA1c 7.0.  Will further adjust dose of Levemir as CBG is not optimally controlled.   Morbid obesity BMI 42.43.  DVT Prophylaxis: Patient is on rivaroxaban PUD Prophylaxis: On Protonix Code Status: Full code Family Communication: PCCM has been calling family Disposition Plan: She  will remain in the ICU.   Medications:  Scheduled:  artificial tears  1 application Both Eyes W1X   aspirin EC  81 mg Oral Daily   chlorhexidine gluconate (MEDLINE KIT)  15 mL Mouth Rinse BID   cholecalciferol  2,000 Units Oral Daily   dexamethasone (DECADRON) injection  6 mg Intravenous Q12H   feeding supplement (PRO-STAT SUGAR FREE 64)  30 mL Per Tube QID   feeding supplement (VITAL AF 1.2 CAL)  1,000 mL Per Tube Q24H   ferrous sulfate  300 mg Per Tube TID WC   fluticasone furoate-vilanterol  1 puff Inhalation Daily   gabapentin  100 mg Per Tube Q8H   insulin aspart  0-20 Units Subcutaneous Q4H   insulin detemir  24 Units Subcutaneous BID   levothyroxine  112 mcg Oral Q0600   mouth rinse  15 mL Mouth Rinse 10 times per day   multivitamin with minerals  1 tablet Per Tube Daily   pantoprazole sodium  40 mg Per Tube Daily   polyethylene glycol  17 g Oral Daily  rivaroxaban  20 mg Oral Q supper   vitamin C  500 mg Oral Daily   zinc sulfate  220 mg Oral Daily   Continuous:  sodium chloride Stopped (11/22/18 0120)   fentaNYL infusion INTRAVENOUS 200 mcg/hr (11/22/18 0900)   magnesium sulfate bolus IVPB     midazolam Stopped (11/20/18 1218)   midazolam 8 mg/hr (11/22/18 0900)   norepinephrine (LEVOPHED) Adult infusion 4 mcg/min (11/22/18 1017)   remdesivir 100 mg in NS 250 mL Stopped (11/21/18 2152)   TTS:VXBLTJ chloride, acetaminophen **OR** acetaminophen, albuterol, bisacodyl, diltiazem, docusate sodium, fentaNYL, midazolam, midazolam, sennosides, traZODone, vecuronium   Objective:  Vital Signs  Vitals:   11/22/18 0700 11/22/18 0800 11/22/18 0900 11/22/18 1136  BP: 131/70 132/70 131/66   Pulse: 66 64 64   Resp:      Temp:  (!) 96.1 F (35.6 C)    TempSrc:  Axillary    SpO2: 97% 96% 97% 98%  Weight:      Height:        Intake/Output Summary (Last 24 hours) at 11/22/2018 1140 Last data filed at 11/22/2018 0900 Gross per 24 hour  Intake  1960.07 ml  Output 3050 ml  Net -1089.93 ml   Filed Weights   11/19/18 0500 11/20/18 0150 11/21/18 0500  Weight: 108.6 kg 110.3 kg 112.1 kg    General appearance: Intubated and sedated.  In prone position this morning. Resp: Crackles bilaterally.  No wheezing or rhonchi. Cardio: Unable to auscultate heart as patient is in prone position GI: Unable to examine as patient is in prone position Extremities: Improving edema.  She has chronic lower extremity edema right greater than left.. Neurologic: Fully sedated    Lab Results:  Data Reviewed: I have personally reviewed following labs and imaging studies  CBC: Recent Labs  Lab 11/25/2018 2000 11/19/18 0558  11/20/18 0651  11/20/18 1337 11/20/18 1708 11/21/18 0410 11/21/18 0628 11/22/18 0550  WBC 10.7* 10.1  --  16.6*  --   --   --  16.6*  --  17.4*  NEUTROABS  --  8.8*  --  13.9*  --   --   --  14.8*  --  15.7*  HGB 12.8 12.6   < > 12.8   < > 13.3 13.6 12.7 13.3 12.9  HCT 40.7 39.6   < > 42.1   < > 39.0 40.0 41.1 39.0 42.8  MCV 85.3 86.1  --  87.9  --   --   --  89.3  --  88.4  PLT 138* 136*  --  186  --   --   --  179  --  179   < > = values in this interval not displayed.    Basic Metabolic Panel: Recent Labs  Lab 11/19/18 0558  11/19/18 2020 11/20/18 0651  11/20/18 1337 11/20/18 1611 11/20/18 1708 11/21/18 0410 11/21/18 0628 11/21/18 1630 11/22/18 0550  NA 136   < > 137 136   < > 137  --  138 138 138  --  142  K 5.0   < > 5.4* 5.4*   < > 5.4*  --  5.5* 5.0 4.7  --  4.8  CL 102  --  101 101  --   --   --   --  105  --   --  105  CO2 24  --  23 23  --   --   --   --  24  --   --  28  GLUCOSE 223*  --  274* 247*  --   --   --   --  322*  --   --  287*  BUN 33*  --  34* 39*  --   --   --   --  49*  --   --  57*  CREATININE 1.29*  --  1.24* 1.35*  --   --   --   --  1.57*  --   --  1.51*  CALCIUM 8.7*  --  8.8* 8.6*  --   --   --   --  8.4*  --   --  8.5*  MG  --   --  2.2 2.1  --   --  2.2  --  2.2  --  2.3  --    PHOS  --   --  5.5* 4.1  --   --  5.0*  --  3.9  --  4.2  --    < > = values in this interval not displayed.    GFR: Estimated Creatinine Clearance: 43 mL/min (A) (by C-G formula based on SCr of 1.51 mg/dL (H)).  Liver Function Tests: Recent Labs  Lab 11/19/18 0558 11/19/18 2020 11/20/18 0651 11/21/18 0410 11/22/18 0550  AST 49* 71* 52* 36 27  ALT 30 44 41 35 33  ALKPHOS 80 101 96 91 88  BILITOT 0.7 0.8 0.5 0.4 0.5  PROT 7.1 7.2 7.0 6.4* 6.4*  ALBUMIN 2.8* 2.7* 2.6* 2.5* 2.5*      CBG: Recent Labs  Lab 11/21/18 1945 11/22/18 0008 11/22/18 0454 11/22/18 0752 11/22/18 1118  GLUCAP 218* 236* 255* 252* 246*    Thyroid Function Tests: Recent Labs    11/20/18 0652 11/21/18 0410  TSH <0.010*  --   FREET4  --  1.00    Anemia Panel: Recent Labs    11/21/18 0410 11/22/18 0550  FERRITIN 1,186* 855*    Recent Results (from the past 240 hour(s))  SARS Coronavirus 2 (CEPHEID- Performed in [place] hospital lab), Hosp Order     Status: Abnormal   Collection Time: 11/26/2018  8:29 PM   Specimen: Nasopharyngeal Swab  Result Value Ref Range Status   SARS Coronavirus 2 POSITIVE (A) NEGATIVE Final    Comment: CRITICAL RESULT CALLED TO, READ BACK BY AND VERIFIED WITH: RN P HOWARD AT 0106 11/19/18 CRUICKSHANK A (NOTE) If result is NEGATIVE SARS-CoV-2 target nucleic acids are NOT DETECTED. The SARS-CoV-2 RNA is generally detectable in upper and lower  respiratory specimens during the acute phase of infection. The lowest  concentration of SARS-CoV-2 viral copies this assay can detect is 250  copies / mL. A negative result does not preclude SARS-CoV-2 infection  and should not be used as the sole basis for treatment or other  patient management decisions.  A negative result may occur with  improper specimen collection / handling, submission of specimen other  than nasopharyngeal swab, presence of viral mutation(s) within the  areas targeted by this assay, and  inadequate number of viral copies  (<250 copies / mL). A negative result must be combined with clinical  observations, patient history, and epidemiological information. If result is POSITIVE SARS-CoV-2 target nucleic acids a re DETECTED. The SARS-CoV-2 RNA is generally detectable in upper and lower  respiratory specimens during the acute phase of infection.  Positive  results are indicative of active infection with SARS-CoV-2.  Clinical  correlation with patient history and other diagnostic information is  necessary to determine patient infection status.  Positive results do  not rule out bacterial infection or co-infection with other viruses. If result is PRESUMPTIVE POSTIVE SARS-CoV-2 nucleic acids MAY BE PRESENT.   A presumptive positive result was obtained on the submitted specimen  and confirmed on repeat testing.  While 2019 novel coronavirus  (SARS-CoV-2) nucleic acids may be present in the submitted sample  additional confirmatory testing may be necessary for epidemiological  and / or clinical management purposes  to differentiate between  SARS-CoV-2 and other Sarbecovirus currently known to infect humans.  If clinically indicated additional testing with an alternate test  methodolo gy [phone]) is advised. The SARS-CoV-2 RNA is generally  detectable in upper and lower respiratory specimens during the acute  phase of infection. The expected result is Negative. Fact Sheet for Patients:  StrictlyIdeas.no Fact Sheet for Healthcare Providers: BankingDealers.co.za This test is not yet approved or cleared by the [place] FDA and has been authorized for detection and/or diagnosis of SARS-CoV-2 by FDA under an Emergency Use Authorization (EUA).  This EUA will remain in effect (meaning this test can be used) for the duration of the COVID-19 declaration under Section 564(b)(1) of the Act, 21 U.S.C. section 360bbb-3(b)(1), unless the  authorization is terminated or revoked sooner. Performed at [facility], [place] [address] [place], [place] [address]   MRSA PCR Screening     Status: None   Collection Time: 11/01/2018 11:15 PM   Specimen: Nasal Mucosa; Nasopharyngeal  Result Value Ref Range Status   MRSA by PCR NEGATIVE NEGATIVE Final    Comment:        The GeneXpert MRSA Assay (FDA approved for NASAL specimens only), is one component of a comprehensive MRSA colonization surveillance program. It is not intended to diagnose MRSA infection nor to guide or monitor treatment for MRSA infections. Performed at [facility], [place] [address], [place], [place] [address]       Radiology Studies: Dg Chest Port 1 View  Result Date: 11/21/2018 CLINICAL DATA:  Pneumonia due to COVID-19 EXAM: PORTABLE CHEST 1 VIEW COMPARISON:  Portable exam 1614 hours compared to 11/19/2018 FINDINGS: Tip of endotracheal tube projects 2.8 cm above carina. Nasogastric tube extends into stomach. LEFT jugular central venous catheter with tip projecting over SVC. Upper normal heart size. Atherosclerotic calcification aorta. Elevation of RIGHT diaphragm. Patchy airspace infiltrates bilaterally consistent with pneumonia and history of COVID-19. No pleural effusion or pneumothorax. Bones unremarkable. IMPRESSION: Persistent BILATERAL pulmonary infiltrates consistent with pneumonia. Electronically Signed   By: Lavonia Dana M.D.   On: 11/21/2018 18:41       LOS: 4 days   [facility] Pager on [email or web address]  11/22/2018, 11:40 AM

## 2018-11-22 NOTE — Progress Notes (Signed)
Pt daughter called and updated on pt care. Family was able to facetime with patient and ask pertinent questions.

## 2018-11-23 ENCOUNTER — Inpatient Hospital Stay (HOSPITAL_COMMUNITY): Payer: Medicare Other

## 2018-11-23 ENCOUNTER — Encounter (HOSPITAL_COMMUNITY): Payer: Self-pay

## 2018-11-23 DIAGNOSIS — T797XXS Traumatic subcutaneous emphysema, sequela: Secondary | ICD-10-CM

## 2018-11-23 DIAGNOSIS — T797XXA Traumatic subcutaneous emphysema, initial encounter: Secondary | ICD-10-CM | POA: Diagnosis present

## 2018-11-23 DIAGNOSIS — J9601 Acute respiratory failure with hypoxia: Secondary | ICD-10-CM

## 2018-11-23 DIAGNOSIS — Z789 Other specified health status: Secondary | ICD-10-CM

## 2018-11-23 LAB — GLUCOSE, CAPILLARY
Glucose-Capillary: 244 mg/dL — ABNORMAL HIGH (ref 70–99)
Glucose-Capillary: 277 mg/dL — ABNORMAL HIGH (ref 70–99)
Glucose-Capillary: 299 mg/dL — ABNORMAL HIGH (ref 70–99)
Glucose-Capillary: 341 mg/dL — ABNORMAL HIGH (ref 70–99)
Glucose-Capillary: 341 mg/dL — ABNORMAL HIGH (ref 70–99)
Glucose-Capillary: 345 mg/dL — ABNORMAL HIGH (ref 70–99)
Glucose-Capillary: 352 mg/dL — ABNORMAL HIGH (ref 70–99)

## 2018-11-23 LAB — CBC
HCT: 42.5 % (ref 36.0–46.0)
Hemoglobin: 12.6 g/dL (ref 12.0–15.0)
MCH: 26.6 pg (ref 26.0–34.0)
MCHC: 29.6 g/dL — ABNORMAL LOW (ref 30.0–36.0)
MCV: 89.7 fL (ref 80.0–100.0)
Platelets: 180 10*3/uL (ref 150–400)
RBC: 4.74 MIL/uL (ref 3.87–5.11)
RDW: 13.1 % (ref 11.5–15.5)
WBC: 17.8 10*3/uL — ABNORMAL HIGH (ref 4.0–10.5)
nRBC: 0.4 % — ABNORMAL HIGH (ref 0.0–0.2)

## 2018-11-23 LAB — POCT I-STAT 7, (LYTES, BLD GAS, ICA,H+H)
Acid-Base Excess: 3 mmol/L — ABNORMAL HIGH (ref 0.0–2.0)
Bicarbonate: 30.9 mmol/L — ABNORMAL HIGH (ref 20.0–28.0)
Calcium, Ion: 1.29 mmol/L (ref 1.15–1.40)
HCT: 39 % (ref 36.0–46.0)
Hemoglobin: 13.3 g/dL (ref 12.0–15.0)
O2 Saturation: 89 %
Patient temperature: 98.2
Potassium: 4.8 mmol/L (ref 3.5–5.1)
Sodium: 143 mmol/L (ref 135–145)
TCO2: 33 mmol/L — ABNORMAL HIGH (ref 22–32)
pCO2 arterial: 59.2 mmHg — ABNORMAL HIGH (ref 32.0–48.0)
pH, Arterial: 7.324 — ABNORMAL LOW (ref 7.350–7.450)
pO2, Arterial: 62 mmHg — ABNORMAL LOW (ref 83.0–108.0)

## 2018-11-23 LAB — BASIC METABOLIC PANEL
Anion gap: 10 (ref 5–15)
BUN: 80 mg/dL — ABNORMAL HIGH (ref 8–23)
CO2: 28 mmol/L (ref 22–32)
Calcium: 8.5 mg/dL — ABNORMAL LOW (ref 8.9–10.3)
Chloride: 105 mmol/L (ref 98–111)
Creatinine, Ser: 1.74 mg/dL — ABNORMAL HIGH (ref 0.44–1.00)
GFR calc Af Amer: 34 mL/min — ABNORMAL LOW (ref >60)
GFR calc non Af Amer: 30 mL/min — ABNORMAL LOW (ref >60)
Glucose, Bld: 329 mg/dL — ABNORMAL HIGH (ref 70–99)
Potassium: 5.1 mmol/L (ref 3.5–5.1)
Sodium: 143 mmol/L (ref 135–145)

## 2018-11-23 LAB — D-DIMER, QUANTITATIVE: D-Dimer, Quant: 14.31 ug/mL-FEU — ABNORMAL HIGH (ref 0.00–0.50)

## 2018-11-23 LAB — HEPARIN LEVEL (UNFRACTIONATED): Heparin Unfractionated: >2.2 IU/mL — ABNORMAL HIGH (ref 0.30–0.70)

## 2018-11-23 LAB — APTT: aPTT: 25 seconds (ref 24–36)

## 2018-11-23 LAB — C-REACTIVE PROTEIN: CRP: 5 mg/dL — ABNORMAL HIGH (ref <1.0)

## 2018-11-23 LAB — LACTATE DEHYDROGENASE: LDH: 433 U/L — ABNORMAL HIGH (ref 98–192)

## 2018-11-23 MED ORDER — AMIODARONE IV BOLUS ONLY 150 MG/100ML
150.0000 mg | Freq: Once | INTRAVENOUS | Status: AC
  Administered 2018-11-23: 18:00:00 150 mg via INTRAVENOUS

## 2018-11-23 MED ORDER — HEPARIN (PORCINE) 25000 UT/250ML-% IV SOLN
1250.0000 [IU]/h | INTRAVENOUS | Status: DC
  Administered 2018-11-23: 18:00:00 1250 [IU]/h via INTRAVENOUS
  Filled 2018-11-23: qty 250

## 2018-11-23 MED ORDER — ASPIRIN 81 MG PO CHEW
81.0000 mg | CHEWABLE_TABLET | Freq: Every day | ORAL | Status: DC
  Administered 2018-11-23 – 2018-11-25 (×3): 81 mg
  Filled 2018-11-23 (×3): qty 1

## 2018-11-23 MED ORDER — DEXAMETHASONE SODIUM PHOSPHATE 10 MG/ML IJ SOLN
6.0000 mg | INTRAMUSCULAR | Status: DC
  Administered 2018-11-24 – 2018-11-25 (×2): 6 mg via INTRAVENOUS
  Filled 2018-11-23 (×2): qty 1

## 2018-11-23 MED ORDER — PHENYLEPHRINE HCL-NACL 10-0.9 MG/250ML-% IV SOLN
0.0000 ug/min | INTRAVENOUS | Status: DC
  Administered 2018-11-23 (×2): 40 ug/min via INTRAVENOUS
  Administered 2018-11-23: 20:00:00 140 ug/min via INTRAVENOUS
  Administered 2018-11-24: 02:00:00 50 ug/min via INTRAVENOUS
  Administered 2018-11-24: 16:00:00 15 ug/min via INTRAVENOUS
  Administered 2018-11-24: 30 ug/min via INTRAVENOUS
  Administered 2018-11-25: 15 ug/min via INTRAVENOUS
  Administered 2018-11-26: 14:00:00 20 ug/min via INTRAVENOUS
  Filled 2018-11-23 (×7): qty 250

## 2018-11-23 NOTE — Progress Notes (Signed)
ANTICOAGULATION CONSULT NOTE - Follow Up Consult  Pharmacy Consult for Heparin Indication: Afib, Hx DVT on PTA Xarelto  Allergies  Allergen Reactions  . Sulfonamide Derivatives Itching    Patient Measurements: Height: '5\' 3"'$  (160 cm) Weight: 245 lb 13 oz (111.5 kg) IBW/kg (Calculated) : 52.4 Heparin Dosing Weight: 79 kg  Vital Signs: Temp: 98.2 F (36.8 C) (07/27 0800) Temp Source: Axillary (07/27 0800) BP: 102/59 (07/27 1000) Pulse Rate: 85 (07/27 1000)  Labs: Recent Labs    11/20/18 1611  11/21/18 0410  11/22/18 0550 11/22/18 1815 11/23/18 0500  HGB  --    < > 12.7   < > 12.9 13.6 12.6  HCT  --    < > 41.1   < > 42.8 40.0 42.5  PLT  --   --  179  --  179  --  180  CREATININE  --   --  1.57*  --  1.51*  --  1.74*  TROPONINIHS 74*  --   --   --   --   --   --    < > = values in this interval not displayed.    Estimated Creatinine Clearance: 37.1 mL/min (A) (by C-G formula based on SCr of 1.74 mg/dL (H)).   Medications:  Scheduled:  . artificial tears  1 application Both Eyes S8N  . aspirin  81 mg Per Tube Daily  . chlorhexidine gluconate (MEDLINE KIT)  15 mL Mouth Rinse BID  . cholecalciferol  2,000 Units Oral Daily  . dexamethasone (DECADRON) injection  6 mg Intravenous Q12H  . feeding supplement (PRO-STAT SUGAR FREE 64)  30 mL Per Tube QID  . feeding supplement (VITAL AF 1.2 CAL)  1,000 mL Per Tube Q24H  . ferrous sulfate  300 mg Per Tube TID WC  . fluticasone furoate-vilanterol  1 puff Inhalation Daily  . gabapentin  100 mg Per Tube Q8H  . insulin aspart  0-20 Units Subcutaneous Q4H  . insulin detemir  30 Units Subcutaneous BID  . levothyroxine  112 mcg Oral Q0600  . mouth rinse  15 mL Mouth Rinse 10 times per day  . multivitamin with minerals  1 tablet Per Tube Daily  . pantoprazole sodium  40 mg Per Tube Daily  . polyethylene glycol  17 g Oral Daily  . vitamin C  500 mg Oral Daily  . zinc sulfate  220 mg Oral Daily   Infusions:  . sodium chloride  Stopped (11/23/18 0159)  . amiodarone 30 mg/hr (11/23/18 1000)  . fentaNYL infusion INTRAVENOUS 250 mcg/hr (11/23/18 1114)  . magnesium sulfate bolus IVPB    . midazolam 8 mg/hr (11/23/18 1000)  . midazolam 8 mg/hr (11/22/18 0900)  . norepinephrine (LEVOPHED) Adult infusion 5 mcg/min (11/23/18 1000)    Assessment: 51 yoF admitted on 7/22 with PMH of afib and DVT on chronic Xarelto.  She has developed subcutaneous emphysema and may need chest tube in the future (not yet per MD).  Pharmacy is consulted to start Heparin infusion in case chest tube placement is needed.  Last Xarelto dose given on 7/26 at 1700.  Today, 11/23/2018: Baseline HL in process (expect level will be falsely elevated d/t recent Xarelto use) Baseline APTT 25 CBC: Hgb 13.3, Plt 180. SCr increased to 1.74, CrCl ~ 37 ml/min. No bleeding or complications reported.  Goal of Therapy:  Heparin level 0.3-0.7 units/ml aPTT 66-102 seconds Monitor platelets by anticoagulation protocol: Yes   Plan:  Begin heparin 24 hours after last  Xarelto dose. Start heparin IV infusion at 1250 units/hr Heparin level 6 hours after starting Daily heparin level and CBC Follow up plans for chest tube.   Gretta Arab PharmD, BCPS Clinical pharmacist phone 7am- 5pm: [phone] 11/23/2018 12:10 PM

## 2018-11-23 NOTE — Progress Notes (Signed)
eLink Physician-Brief Progress Note Patient Name: [name] [name] DOB: [birth year] MRN: [id number]   Date of Service  11/23/2018  HPI/Events of Note  SQ emphysema, Afib with RVR on Amiodarone infusion  eICU Interventions  Heart rate improved-continue Amio infusion,  fololow up CXR ordered for midnight-will check film, monitor pt for respiratory decompensation.        Kerry Kass Shataya Winkles 11/23/2018, 8:22 PM

## 2018-11-23 NOTE — Progress Notes (Signed)
Tube holder changed at this time, small blister on pt's right cheek noticed. RT placed pink foam under tube holder.

## 2018-11-23 NOTE — Progress Notes (Signed)
PROGRESS NOTE  ARLENIS BLAYDES PPI:[id number] DOB: 04-Apr-1951 DOA: 11/09/2018  PCP: Nicholos Johns, MD  Brief History/Interval Summary: 68 y.o. female, with history of morbid obesity, DM type II, COPD not on oxygen, hypertension, CKD 3 baseline creatinine around 1.4, chronic atrial fibrillation [place] vas 2 score of at least 3 on Xarelto and Cardizem, spine surgeries in the past, chronic fluid retention, history of Graves' disease now hypothyroid, history of CVA, history of DVT who lives at [facility] and was likely exposed to COVID-19 infection about 10 to 14 days prior to this admission.    She was experiencing fever, body aches and developed shortness of breath.  She finally decided to come into the emergency department at [facility].  She was found to be positive for COVID-19.  Was noted to have hypoxia.  She was hospitalized for further management.  Reason for Visit: Acute respiratory failure with hypoxia due to COVID-19  Consultants: Pulmonology  Procedures:   Transthoracic echocardiogram 7/24 1. The left ventricle has hyperdynamic systolic function, with an ejection fraction of >65%. The cavity size was normal. Left ventricular diastolic Doppler parameters are consistent with impaired relaxation.  2. The right ventricle has normal systolc function. The cavity was normal. There is no increase in right ventricular wall thickness. Right ventricular systolic pressure is normal.  3. Mild thickening of the aortic valve. No stenosis of the aortic valve.  4. The aortic root and ascending aorta are normal in size and structure.    Antibiotics: Anti-infectives (From admission, onward)   Start     Dose/Rate Route Frequency Ordered Stop   11/19/18 2030  remdesivir 100 mg in sodium chloride 0.9 % 250 mL IVPB     100 mg 500 mL/hr over 30 Minutes Intravenous Every 24 hours 11/11/2018 1855 11/22/18 2204   11/21/2018 2030  remdesivir 200 mg in sodium chloride 0.9 % 250 mL IVPB     200 mg  500 mL/hr over 30 Minutes Intravenous Once 11/16/2018 1855 11/12/2018 2056       Subjective/Interval History: Patient remains intubated and sedated.  Overnight she had episodes of atrial fibrillation with RVR.  Amiodarone was resumed.    Assessment/Plan:  Acute Hypoxic Resp. Failure due to Acute Covid 19 Viral Illness  COVID-19 Labs  Recent Labs    11/21/18 0410 11/22/18 0550 11/23/18 0500  DDIMER 3.75* 10.92* 14.31*  FERRITIN 1,186* 855*  --   LDH 479* 435* 433*  CRP 9.4* 6.9* 5.0*    Lab Results  Component Value Date   SARSCOV2NAA POSITIVE (A) 11/12/2018     Fever: Has been afebrile Oxygen requirements: Mechanical ventilation.  50% FiO2.  Saturating in the 90s.    Antibacterials: None Remdesivir: Completed course Steroids: Dexamethasone 6 mg every 12 hours.  Start tapering from today Diuretics: She has been given Lasix the last 2 days.  Hold for today due to rise in BUN and creatinine Actemra: Has received 2 doses, 7/22 and 7/23 Vitamin C and Zinc: Continue DVT Prophylaxis: Patient is on rivaroxaban  Patient remains intubated and on mechanical ventilation.  Pulmonology is following and managing.    From a COVID-19 standpoint patient remains on steroids.  She is completed course of Remdesivir.  She has received 2 doses of Actemra.  CRP is improved to 5.0 today.  D-dimer noted to be high elevated today at 14.31.  Patient is already on rivaroxaban.  Leukocytosis most likely due to steroids.    Subcutaneous emphysema Overnight she was also found to  have subcutaneous emphysema.  Chest x-ray also which suggests mediastinal emphysema.  She is otherwise stable.  Continue to monitor clinically for now.  No indication to place chest tube.  Acute renal failure on chronic kidney disease stage III Baseline renal function not entirely known.  There is a creatinine measurement from 2017 of 1.28.  Creatinine was 0.9 in August 2018.  Creatinine has been elevated here in the hospital  along with her BUN.  She was given Lasix for poor urine output with brisk diuresis.  BUN and creatinine have gone up today.  We will hold off on further doses of Lasix for now.  Potassium is 5.1.  She was given Lokelma 2 days ago.  She has chronic lower extremity edema, right greater than left.  She does take diuretics at home which was placed on hold at admission.  ARB is on hold.    History of chronic atrial fibrillation/episodes of V. tach Chads 2 vascular score is at least 3.  Patient was noted to have V. tach after she was intubated and she was placed on amiodarone infusion which was held due to bradycardia.  She has had episodes of rapid A. fib overnight.  Started back on amiodarone infusion with improvement in heart rate.  Remains in atrial fibrillation. High-sensitivity troponin I was 81 and then 74.  Not significantly elevated.  Echocardiogram normal systolic function.  Diastolic dysfunction was noted.  No significant valvular abnormalities.  History of asthma versus COPD Continue her home medications.  She is not on home oxygen.  Hypothyroidism Continue levothyroxine.  As noted above her TSH was noted to be low.  Free T4 is 1.0.  No further work-up.  Low TSH likely sick euthyroid.  History of stroke Patient is on Xarelto and aspirin.  This is being continued as she came into the hospital on these medications.  Not noted to be on statin.  Reason for this is not entirely clear.  This can be addressed when she is over her acute illness.  History of vitamin D deficiency Continue supplementation.  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  Stable.  Diabetes mellitus type 2 with hyperglycemia Is still poorly controlled.  We will adjust dose of Levemir.  HbA1c 7.0.  Elevated CBGs most likely due to steroids.     Morbid obesity BMI 42.43.  DVT Prophylaxis: Patient is on  rivaroxaban PUD Prophylaxis: On Protonix Code Status: Full code Family Communication: PCCM has been calling family Disposition Plan: She will remain in the ICU.   Medications:  Scheduled: . artificial tears  1 application Both Eyes O1B  . aspirin  81 mg Per Tube Daily  . chlorhexidine gluconate (MEDLINE KIT)  15 mL Mouth Rinse BID  . cholecalciferol  2,000 Units Oral Daily  . dexamethasone (DECADRON) injection  6 mg Intravenous Q12H  . feeding supplement (PRO-STAT SUGAR FREE 64)  30 mL Per Tube QID  . feeding supplement (VITAL AF 1.2 CAL)  1,000 mL Per Tube Q24H  . ferrous sulfate  300 mg Per Tube TID WC  . fluticasone furoate-vilanterol  1 puff Inhalation Daily  . gabapentin  100 mg Per Tube Q8H  . insulin aspart  0-20 Units Subcutaneous Q4H  . insulin detemir  30 Units Subcutaneous BID  . levothyroxine  112 mcg Oral Q0600  . mouth rinse  15 mL Mouth Rinse 10 times per day  . multivitamin with  minerals  1 tablet Per Tube Daily  . pantoprazole sodium  40 mg Per Tube Daily  . polyethylene glycol  17 g Oral Daily  . vitamin C  500 mg Oral Daily  . zinc sulfate  220 mg Oral Daily   Continuous: . sodium chloride Stopped (11/23/18 0159)  . amiodarone 30 mg/hr (11/23/18 1000)  . fentaNYL infusion INTRAVENOUS 250 mcg/hr (11/23/18 1114)  . magnesium sulfate bolus IVPB    . midazolam 8 mg/hr (11/23/18 1000)  . midazolam 8 mg/hr (11/23/18 1243)  . norepinephrine (LEVOPHED) Adult infusion 5 mcg/min (11/23/18 1000)   EXH:BZJIRC chloride, acetaminophen **OR** acetaminophen, albuterol, bisacodyl, diltiazem, docusate sodium, fentaNYL, midazolam, midazolam, sennosides, traZODone, vecuronium   Objective:  Vital Signs  Vitals:   11/23/18 0700 11/23/18 0740 11/23/18 0800 11/23/18 1000  BP: 130/74  (!) 139/101 (!) 102/59  Pulse: 76  93 85  Resp:      Temp: (!) 97.1 F (36.2 C)  98.2 F (36.8 C)   TempSrc: Axillary  Axillary   SpO2: 97% 96% 96% 93%  Weight:      Height:         Intake/Output Summary (Last 24 hours) at 11/23/2018 1326 Last data filed at 11/23/2018 1000 Gross per 24 hour  Intake 2282.72 ml  Output 1590 ml  Net 692.72 ml   Filed Weights   11/20/18 0150 11/21/18 0500 11/23/18 0500  Weight: 110.3 kg 112.1 kg 111.5 kg    General appearance: Intubated and sedated. Resp: Breath sounds bilaterally.  Crackles at the bases.  No wheezing or rhonchi. Cardio: S1-S2 is irregularly irregular.  No S3-S4. GI: Abdomen is soft.  Nontender nondistended.  Bowel sounds are present normal.  No masses organomegaly Extremities: Improved edema.  Does have chronic lower extremity edema right greater than left.. Neurologic: Fully sedated    Lab Results:  Data Reviewed: I have personally reviewed following labs and imaging studies  CBC: Recent Labs  Lab 11/19/18 0558  11/20/18 0651  11/21/18 0410 11/21/18 0628 11/22/18 0550 11/22/18 1815 11/23/18 0500 11/23/18 1201  WBC 10.1  --  16.6*  --  16.6*  --  17.4*  --  17.8*  --   NEUTROABS 8.8*  --  13.9*  --  14.8*  --  15.7*  --   --   --   HGB 12.6   < > 12.8   < > 12.7 13.3 12.9 13.6 12.6 13.3  HCT 39.6   < > 42.1   < > 41.1 39.0 42.8 40.0 42.5 39.0  MCV 86.1  --  87.9  --  89.3  --  88.4  --  89.7  --   PLT 136*  --  186  --  179  --  179  --  180  --    < > = values in this interval not displayed.    Basic Metabolic Panel: Recent Labs  Lab 11/19/18 2020 11/20/18 0651  11/20/18 1611  11/21/18 0410 11/21/18 0628 11/21/18 1630 11/22/18 0550 11/22/18 1815 11/23/18 0500 11/23/18 1201  NA 137 136   < >  --    < > 138 138  --  142 139 143 143  K 5.4* 5.4*   < >  --    < > 5.0 4.7  --  4.8 4.9 5.1 4.8  CL 101 101  --   --   --  105  --   --  105  --  105  --   CO2  23 23  --   --   --  24  --   --  28  --  28  --   GLUCOSE 274* 247*  --   --   --  322*  --   --  287*  --  329*  --   BUN 34* 39*  --   --   --  49*  --   --  57*  --  80*  --   CREATININE 1.24* 1.35*  --   --   --  1.57*  --   --   1.51*  --  1.74*  --   CALCIUM 8.8* 8.6*  --   --   --  8.4*  --   --  8.5*  --  8.5*  --   MG 2.2 2.1  --  2.2  --  2.2  --  2.3  --   --   --   --   PHOS 5.5* 4.1  --  5.0*  --  3.9  --  4.2  --   --   --   --    < > = values in this interval not displayed.    GFR: Estimated Creatinine Clearance: 37.1 mL/min (A) (by C-G formula based on SCr of 1.74 mg/dL (H)).  Liver Function Tests: Recent Labs  Lab 11/19/18 0558 11/19/18 2020 11/20/18 0651 11/21/18 0410 11/22/18 0550  AST 49* 71* 52* 36 27  ALT 30 44 41 35 33  ALKPHOS 80 101 96 91 88  BILITOT 0.7 0.8 0.5 0.4 0.5  PROT 7.1 7.2 7.0 6.4* 6.4*  ALBUMIN 2.8* 2.7* 2.6* 2.5* 2.5*      CBG: Recent Labs  Lab 11/22/18 2034 11/23/18 0016 11/23/18 0433 11/23/18 0736 11/23/18 1236  GLUCAP 348* 352* 299* 244* 277*    Thyroid Function Tests: Recent Labs    11/21/18 0410  FREET4 1.00    Anemia Panel: Recent Labs    11/21/18 0410 11/22/18 0550  FERRITIN 1,186* 855*    Recent Results (from the past 240 hour(s))  SARS Coronavirus 2 (CEPHEID- Performed in [place] hospital lab), Hosp Order     Status: Abnormal   Collection Time: 11/20/2018  8:29 PM   Specimen: Nasopharyngeal Swab  Result Value Ref Range Status   SARS Coronavirus 2 POSITIVE (A) NEGATIVE Final    Comment: CRITICAL RESULT CALLED TO, READ BACK BY AND VERIFIED WITH: RN P HOWARD AT 0106 11/19/18 CRUICKSHANK A (NOTE) If result is NEGATIVE SARS-CoV-2 target nucleic acids are NOT DETECTED. The SARS-CoV-2 RNA is generally detectable in upper and lower  respiratory specimens during the acute phase of infection. The lowest  concentration of SARS-CoV-2 viral copies this assay can detect is 250  copies / mL. A negative result does not preclude SARS-CoV-2 infection  and should not be used as the sole basis for treatment or other  patient management decisions.  A negative result may occur with  improper specimen collection / handling, submission of specimen  other  than nasopharyngeal swab, presence of viral mutation(s) within the  areas targeted by this assay, and inadequate number of viral copies  (<250 copies / mL). A negative result must be combined with clinical  observations, patient history, and epidemiological information. If result is POSITIVE SARS-CoV-2 target nucleic acids a re DETECTED. The SARS-CoV-2 RNA is generally detectable in upper and lower  respiratory specimens during the acute phase of infection.  Positive  results are indicative of active infection  with SARS-CoV-2.  Clinical  correlation with patient history and other diagnostic information is  necessary to determine patient infection status.  Positive results do  not rule out bacterial infection or co-infection with other viruses. If result is PRESUMPTIVE POSTIVE SARS-CoV-2 nucleic acids MAY BE PRESENT.   A presumptive positive result was obtained on the submitted specimen  and confirmed on repeat testing.  While 2019 novel coronavirus  (SARS-CoV-2) nucleic acids may be present in the submitted sample  additional confirmatory testing may be necessary for epidemiological  and / or clinical management purposes  to differentiate between  SARS-CoV-2 and other Sarbecovirus currently known to infect humans.  If clinically indicated additional testing with an alternate test  methodolo gy [phone]) is advised. The SARS-CoV-2 RNA is generally  detectable in upper and lower respiratory specimens during the acute  phase of infection. The expected result is Negative. Fact Sheet for Patients:  StrictlyIdeas.no Fact Sheet for Healthcare Providers: BankingDealers.co.za This test is not yet approved or cleared by the [place] FDA and has been authorized for detection and/or diagnosis of SARS-CoV-2 by FDA under an Emergency Use Authorization (EUA).  This EUA will remain in effect (meaning this test can be used) for the duration  of the COVID-19 declaration under Section 564(b)(1) of the Act, 21 U.S.C. section 360bbb-3(b)(1), unless the authorization is terminated or revoked sooner. Performed at [facility], [place] [address], [place], [place] [address]   MRSA PCR Screening     Status: None   Collection Time: 11/05/2018 11:15 PM   Specimen: Nasal Mucosa; Nasopharyngeal  Result Value Ref Range Status   MRSA by PCR NEGATIVE NEGATIVE Final    Comment:        The GeneXpert MRSA Assay (FDA approved for NASAL specimens only), is one component of a comprehensive MRSA colonization surveillance program. It is not intended to diagnose MRSA infection nor to guide or monitor treatment for MRSA infections. Performed at [facility], [place] [address], [place], [place] [address]       Radiology Studies: Dg Chest Port 1 View  Result Date: 11/23/2018 CLINICAL DATA:  Subcutaneous emphysema. EXAM: PORTABLE CHEST 1 VIEW COMPARISON:  11/22/2018. FINDINGS: Endotracheal tube, left IJ line, NG tube in stable position. Heart size stable. Bilateral pulmonary infiltrates unchanged. Mild basilar atelectasis. No pleural effusion or pneumothorax. Diffuse bilateral neck and right lateral chest wall subcutaneous emphysema again noted. No interim change. IMPRESSION: 1.  Lines and tubes in stable position. 2.  Bilateral pulmonary infiltrates unchanged. 3. Bilateral neck and right lateral chest wall subcutaneous emphysema again noted without interim change. No pneumothorax noted. Chest is unchanged from prior exam. Electronically Signed   By: Marcello Moores  Register   On: 11/23/2018 06:53   Dg Chest Port 1 View  Result Date: 11/22/2018 CLINICAL DATA:  Subcutaneous emphysema, COVID positive patient EXAM: PORTABLE CHEST 1 VIEW COMPARISON:  11/21/2018 FINDINGS: Endotracheal tube terminates 3 cm above the carina. Left IJ venous catheter terminates at the junction of the distal left brachiocephalic vein and SVC. Enteric  tube courses of the stomach. Mild patchy bilateral pulmonary opacities in the right perihilar lung and left upper and lower lobes. No pleural effusion. No pneumothorax is seen. Subcutaneous emphysema in the bilateral neck and right lateral chest wall/axilla, new. IMPRESSION: New subcutaneous emphysema in the bilateral neck and right lateral chest wall/axilla. No pneumothorax is seen. Mild patchy opacities in the lungs bilaterally, left greater than right, in this patient with known COVID. Endotracheal tube terminates  3 cm above the carina. Additional support apparatus as above. Electronically Signed   By: Julian Hy M.D.   On: 11/22/2018 22:57   Dg Chest Port 1 View  Result Date: 11/21/2018 CLINICAL DATA:  Pneumonia due to COVID-19 EXAM: PORTABLE CHEST 1 VIEW COMPARISON:  Portable exam 1614 hours compared to 11/19/2018 FINDINGS: Tip of endotracheal tube projects 2.8 cm above carina. Nasogastric tube extends into stomach. LEFT jugular central venous catheter with tip projecting over SVC. Upper normal heart size. Atherosclerotic calcification aorta. Elevation of RIGHT diaphragm. Patchy airspace infiltrates bilaterally consistent with pneumonia and history of COVID-19. No pleural effusion or pneumothorax. Bones unremarkable. IMPRESSION: Persistent BILATERAL pulmonary infiltrates consistent with pneumonia. Electronically Signed   By: Lavonia Dana M.D.   On: 11/21/2018 18:41       LOS: 5 days   [facility] Pager on [email or web address]  11/23/2018, 1:26 PM

## 2018-11-23 NOTE — Progress Notes (Signed)
Attempted to reach daughter [name] by phone for evening update. Left message for return call. Will continue to closely monitor patient.

## 2018-11-23 NOTE — Progress Notes (Signed)
Patient's daughter called, updated on patient's status and questions answered.

## 2018-11-23 NOTE — Progress Notes (Signed)
Physical Therapy Discharge Patient Details Name: DEZIRAE SERVICE MRN: [id number] DOB: 20-Apr-1951 Today's Date: 11/23/2018 Time:  -     Patient discharged from PT services secondary to medical decline - will need to re-order PT to resume therapy services. Remains on vent,  Afib/RVR. And other medical issues.      GP     Claretha Cooper 11/23/2018, 7:14 AM [place]  Office [phone]

## 2018-11-23 NOTE — Progress Notes (Signed)
NAME:  [name] [name], MRN:  [id number], DOB:  [birth year], LOS: 5 ADMISSION DATE:  10/29/2018, CONSULTATION DATE: July 23 REFERRING MD: Dr. Candiss Norse, CHIEF COMPLAINT: Dyspnea  Brief History   68 year old female admitted for severe acute respiratory failure with hypoxemia due to COVID-19 pneumonia.  Past Medical History  Diabetes mellitus type 2 Obesity Asthma, has been on inhalers in the past, never smoked cigarettes CKD stage III Chronic A. fib History of spine surgery History of Graves' disease, now hypothyroid History of stroke History of DVT Lives in an assisted living facility [facility] Events   July 22 admitted July 23 intubated July 24 prone positioning July 26 subcutaneous air, no pneumothorax, afib with RVR again  Consults:  PCCM  Procedures:  7/23 ETT > 7/23 L IJ CVL >  Significant Diagnostic Tests:  7/24 Echo > LVEF > 65%, impaired relaxation, RV normal systolic function, mild thickening of aortic valve  Micro Data:  July 22 SARS COV 2 Positive  Antimicrobials:  July 22 remdesivir> 7/26 July 22 decadron July 22> 23 Toclizumab   Interim history/subjective:   Subcutaneous air noted on exam overnight No pneumothorax seen on CXR  Objective   Blood pressure 115/74, pulse (!) 117, temperature (!) 97.5 F (36.4 C), temperature source Oral, resp. rate (!) 35, height 5\' 3"  (1.6 m), weight 111.5 kg, SpO2 94 %. CVP:  [6 mmHg-20 mmHg] 10 mmHg  Vent Mode: PRVC FiO2 (%):  [50 %-60 %] 50 % Set Rate:  [35 bmp] 35 bmp Vt Set:  [360 mL] 360 mL PEEP:  [10 DUK02-54 cmH20] 10 cmH20 Plateau Pressure:  [19 cmH20-25 cmH20] 24 cmH20   Intake/Output Summary (Last 24 hours) at 11/23/2018 0749 Last data filed at 11/23/2018 0700 Gross per 24 hour  Intake 1807.4 ml  Output 1490 ml  Net 317.4 ml   Filed Weights   11/20/18 0150 11/21/18 0500 11/23/18 0500  Weight: 110.3 kg 112.1 kg 111.5 kg    Examination:  General:  In bed on vent HENT: NCAT ETT  in place PULM: CTA B, vent supported breathing CV: RRR, no mgr GI: BS+, soft, nontender MSK: normal bulk and tone Neuro: sedated on vent Derm: palpable crepitance around R clavicle more than L   7/26 CXR > images independently reviewed showing no pneumothorax, pneumomediastinum noted, bilateral airspace disease, subcutaneous air noted above clavicles bilaterally   Resolved Hospital Problem list     Assessment & Plan:  Severe acute respiratory failure with hypoxemia secondary to ARDS from COVID pneumonia: oxygenation improved compared to admission Continue mechanical ventilation per ARDS protocol Target TVol 6-8cc/kgIBW Target Plateau Pressure < 30cm H20 Target driving pressure less than 15 cm of water Target PaO2 55-65: titrate PEEP/FiO2 per protocol As long as PaO2 to FiO2 ratio is less than 1:150 position in prone position for 16 hours a day Check CVP daily if CVL in place Target CVP less than 4, diurese as necessary Ventilator associated pneumonia prevention protocol 7/27 plan: hold lasix with increased Cr, no need for prone positioning with improved oxygenation  Pneumomediastinum: we have seen this several times in patients with advanced ARDS Agree with minimizing vent settings/pressure Monitor exam for worsening crepitance Daily CXR or if hemodynamics suddenly change  VT 7/23 PM: has not recurred, unclear cause; electrolytes OK, Echo OK Atrial fibrillation 7/26> new problem Tele Start amiodarone Use heparin infusion for now in case chest tube is needed  Need for sedation for ventilator dyssynchrony: Continue neuromuscular blockade protocol for now given  new pneumomediastinum as severe vent dyssynchrony could be lethal RASS target -4 to -5  COVID-19 pneumonia: Continue decadron, completed remdesivir  History of DVT Heparin infusion   Best practice:  Diet: start tube feeding Pain/Anxiety/Delirium protocol (if indicated): RASS score -4 to -5 VAP protocol (if  indicated): yes DVT prophylaxis: Xarelto GI prophylaxis: protonix Glucose control: per TRH Mobility: bed rest Code Status: full Family Communication: I updated her daughter [name] today at length by phone Disposition: remain in ICU  Labs   CBC: Recent Labs  Lab 11/19/18 0558  11/20/18 0651  11/21/18 0410 11/21/18 0628 11/22/18 0550 11/22/18 1815 11/23/18 0500  WBC 10.1  --  16.6*  --  16.6*  --  17.4*  --  17.8*  NEUTROABS 8.8*  --  13.9*  --  14.8*  --  15.7*  --   --   HGB 12.6   < > 12.8   < > 12.7 13.3 12.9 13.6 12.6  HCT 39.6   < > 42.1   < > 41.1 39.0 42.8 40.0 42.5  MCV 86.1  --  87.9  --  89.3  --  88.4  --  89.7  PLT 136*  --  186  --  179  --  179  --  180   < > = values in this interval not displayed.    Basic Metabolic Panel: Recent Labs  Lab 11/19/18 2020 11/20/18 0651  11/20/18 1611  11/21/18 0410 11/21/18 0628 11/21/18 1630 11/22/18 0550 11/22/18 1815 11/23/18 0500  NA 137 136   < >  --    < > 138 138  --  142 139 143  K 5.4* 5.4*   < >  --    < > 5.0 4.7  --  4.8 4.9 5.1  CL 101 101  --   --   --  105  --   --  105  --  105  CO2 23 23  --   --   --  24  --   --  28  --  28  GLUCOSE 274* 247*  --   --   --  322*  --   --  287*  --  329*  BUN 34* 39*  --   --   --  49*  --   --  57*  --  80*  CREATININE 1.24* 1.35*  --   --   --  1.57*  --   --  1.51*  --  1.74*  CALCIUM 8.8* 8.6*  --   --   --  8.4*  --   --  8.5*  --  8.5*  MG 2.2 2.1  --  2.2  --  2.2  --  2.3  --   --   --   PHOS 5.5* 4.1  --  5.0*  --  3.9  --  4.2  --   --   --    < > = values in this interval not displayed.   GFR: Estimated Creatinine Clearance: 37.1 mL/min (A) (by C-G formula based on SCr of 1.74 mg/dL (H)). Recent Labs  Lab 11/20/18 0651 11/21/18 0410 11/22/18 0550 11/23/18 0500  WBC 16.6* 16.6* 17.4* 17.8*    Liver Function Tests: Recent Labs  Lab 11/19/18 0558 11/19/18 2020 11/20/18 0651 11/21/18 0410 11/22/18 0550  AST 49* 71* 52* 36 27  ALT 30 44 41 35  33  ALKPHOS 80 101 96 91 88  BILITOT 0.7  0.8 0.5 0.4 0.5  PROT 7.1 7.2 7.0 6.4* 6.4*  ALBUMIN 2.8* 2.7* 2.6* 2.5* 2.5*   No results for input(s): LIPASE, AMYLASE in the last 168 hours. No results for input(s): AMMONIA in the last 168 hours.  ABG    Component Value Date/Time   PHART 7.292 (L) 11/22/2018 1815   PCO2ART 57.4 (H) 11/22/2018 1815   PO2ART 75.0 (L) 11/22/2018 1815   HCO3 27.9 11/22/2018 1815   TCO2 30 11/22/2018 1815   ACIDBASEDEF 3.0 (H) 11/21/2018 0628   O2SAT 93.0 11/22/2018 1815     Coagulation Profile: No results for input(s): INR, PROTIME in the last 168 hours.  Cardiac Enzymes: No results for input(s): CKTOTAL, CKMB, CKMBINDEX, TROPONINI in the last 168 hours.  HbA1C: Hgb A1c MFr Bld  Date/Time Value Ref Range Status  11/12/2018 08:00 PM 7.0 (H) 4.8 - 5.6 % Final    Comment:    (NOTE) Pre diabetes:          5.7%-6.4% Diabetes:              >6.4% Glycemic control for   <7.0% adults with diabetes   10/18/2016 10:47 AM 6.0 (H) 4.8 - 5.6 % Final    Comment:    (NOTE)         Pre-diabetes: 5.7 - 6.4         Diabetes: >6.4         Glycemic control for adults with diabetes: <7.0     CBG: Recent Labs  Lab 11/22/18 1620 11/22/18 2034 11/23/18 0016 11/23/18 0433 11/23/18 0736  GLUCAP 303* 348* 352* 299* 244*      Critical care time: 40 minutes   Roselie Awkward, MD [place] [facility] Pager: [phone] Cell: [phone] If no response, call [phone]

## 2018-11-23 NOTE — Progress Notes (Signed)
OT Cancellation Note  Patient Details Name: [name] [name] MRN: [id number] DOB: November 15, 1950   Cancelled Treatment:    Reason Eval/Treat Not Completed: Patient not medically ready. Pt remains sedated on vent. OT signing off at this time. Please reorder when appropriate. Thanks  [place], OT/L   Acute OT Clinical Specialist Acute Rehabilitation Services Pager [phone] Office [phone]  11/23/2018, 8:26 AM

## 2018-11-23 NOTE — Progress Notes (Signed)
[facility] PCCM evening rounds  A-fib with RVR this afternoon with levophed running Ventilator on lower settings, oxygenation improved Levophed likely precipitating atrial fib Need to make sure no pneumothorax Plan Check CXR Change levophed to neosynephrine Amiodarone bolus Family updated by me  Additional cc time 76 minutes  Roselie Awkward, MD [place] [facility] Pager: [phone] Cell: [phone] If no response, call [phone]

## 2018-11-23 NOTE — Progress Notes (Signed)
Patient in afib RVR - has already received an amio bolus and is now on a gtt at 30mg /hr. Discussed with elink. Will wait a little bit in hopes that patient's HR will normalize. Will continue to closely monitor.

## 2018-11-24 ENCOUNTER — Inpatient Hospital Stay (HOSPITAL_COMMUNITY): Payer: Medicare Other

## 2018-11-24 DIAGNOSIS — I4891 Unspecified atrial fibrillation: Secondary | ICD-10-CM

## 2018-11-24 DIAGNOSIS — J982 Interstitial emphysema: Secondary | ICD-10-CM | POA: Diagnosis present

## 2018-11-24 LAB — BASIC METABOLIC PANEL
Anion gap: 7 (ref 5–15)
BUN: 76 mg/dL — ABNORMAL HIGH (ref 8–23)
CO2: 29 mmol/L (ref 22–32)
Calcium: 8.2 mg/dL — ABNORMAL LOW (ref 8.9–10.3)
Chloride: 109 mmol/L (ref 98–111)
Creatinine, Ser: 1.35 mg/dL — ABNORMAL HIGH (ref 0.44–1.00)
GFR calc Af Amer: 47 mL/min — ABNORMAL LOW (ref >60)
GFR calc non Af Amer: 40 mL/min — ABNORMAL LOW (ref >60)
Glucose, Bld: 325 mg/dL — ABNORMAL HIGH (ref 70–99)
Potassium: 5.2 mmol/L — ABNORMAL HIGH (ref 3.5–5.1)
Sodium: 145 mmol/L (ref 135–145)

## 2018-11-24 LAB — GLUCOSE, CAPILLARY
Glucose-Capillary: 298 mg/dL — ABNORMAL HIGH (ref 70–99)
Glucose-Capillary: 281 mg/dL — ABNORMAL HIGH (ref 70–99)
Glucose-Capillary: 333 mg/dL — ABNORMAL HIGH (ref 70–99)
Glucose-Capillary: 351 mg/dL — ABNORMAL HIGH (ref 70–99)
Glucose-Capillary: 316 mg/dL — ABNORMAL HIGH (ref 70–99)
Glucose-Capillary: 305 mg/dL — ABNORMAL HIGH (ref 70–99)

## 2018-11-24 LAB — CBC
HCT: 41.7 % (ref 36.0–46.0)
Hemoglobin: 12.3 g/dL (ref 12.0–15.0)
MCH: 26.5 pg (ref 26.0–34.0)
MCHC: 29.5 g/dL — ABNORMAL LOW (ref 30.0–36.0)
MCV: 89.9 fL (ref 80.0–100.0)
Platelets: 177 10*3/uL (ref 150–400)
RBC: 4.64 MIL/uL (ref 3.87–5.11)
RDW: 13.4 % (ref 11.5–15.5)
WBC: 19.8 10*3/uL — ABNORMAL HIGH (ref 4.0–10.5)
nRBC: 0.5 % — ABNORMAL HIGH (ref 0.0–0.2)

## 2018-11-24 LAB — APTT
aPTT: 98 seconds — ABNORMAL HIGH (ref 24–36)
aPTT: 155 seconds — ABNORMAL HIGH (ref 24–36)
aPTT: 195 seconds (ref 24–36)
aPTT: 184 seconds (ref 24–36)

## 2018-11-24 LAB — HEPARIN LEVEL (UNFRACTIONATED): Heparin Unfractionated: 2 IU/mL — ABNORMAL HIGH (ref 0.30–0.70)

## 2018-11-24 LAB — D-DIMER, QUANTITATIVE: D-Dimer, Quant: 13.58 ug/mL-FEU — ABNORMAL HIGH (ref 0.00–0.50)

## 2018-11-24 MED ORDER — AMIODARONE HCL 200 MG PO TABS
400.0000 mg | ORAL_TABLET | Freq: Two times a day (BID) | ORAL | Status: DC
  Administered 2018-11-24 – 2018-11-27 (×7): 400 mg
  Filled 2018-11-24 (×8): qty 2

## 2018-11-24 MED ORDER — INSULIN DETEMIR 100 UNIT/ML ~~LOC~~ SOLN
40.0000 [IU] | Freq: Two times a day (BID) | SUBCUTANEOUS | Status: DC
  Administered 2018-11-24: 40 [IU] via SUBCUTANEOUS
  Filled 2018-11-24: qty 0.4

## 2018-11-24 MED ORDER — SODIUM ZIRCONIUM CYCLOSILICATE 10 G PO PACK
10.0000 g | PACK | Freq: Once | ORAL | Status: AC
  Administered 2018-11-24: 10:00:00 10 g via ORAL
  Filled 2018-11-24: qty 1

## 2018-11-24 MED ORDER — AMIODARONE HCL 200 MG PO TABS: 200.0000 mg | ORAL_TABLET | Freq: Every day | ORAL | Status: DC

## 2018-11-24 MED ORDER — STERILE WATER FOR INJECTION IJ SOLN
INTRAMUSCULAR | Status: AC
  Administered 2018-11-24: 10 mL
  Filled 2018-11-24: qty 10

## 2018-11-24 MED ORDER — INSULIN ASPART 100 UNIT/ML ~~LOC~~ SOLN
4.0000 [IU] | SUBCUTANEOUS | Status: DC
  Administered 2018-11-24 – 2018-11-26 (×11): 4 [IU] via SUBCUTANEOUS

## 2018-11-24 MED ORDER — FUROSEMIDE 10 MG/ML IJ SOLN
40.0000 mg | Freq: Once | INTRAMUSCULAR | Status: AC
  Administered 2018-11-24: 40 mg via INTRAVENOUS
  Filled 2018-11-24: qty 4

## 2018-11-24 MED ORDER — HEPARIN (PORCINE) 25000 UT/250ML-% IV SOLN
750.0000 [IU]/h | INTRAVENOUS | Status: DC
  Administered 2018-11-24: 22:00:00 750 [IU]/h via INTRAVENOUS

## 2018-11-24 MED ORDER — INSULIN DETEMIR 100 UNIT/ML ~~LOC~~ SOLN
45.0000 [IU] | Freq: Two times a day (BID) | SUBCUTANEOUS | Status: DC
  Administered 2018-11-24 – 2018-11-28 (×9): 45 [IU] via SUBCUTANEOUS
  Filled 2018-11-24 (×10): qty 0.45

## 2018-11-24 MED ORDER — HEPARIN (PORCINE) 25000 UT/250ML-% IV SOLN
1050.0000 [IU]/h | INTRAVENOUS | Status: DC
  Administered 2018-11-24 (×2): 1050 [IU]/h via INTRAVENOUS
  Filled 2018-11-24: qty 250

## 2018-11-24 MED ORDER — AMIODARONE HCL 200 MG PO TABS: 200.0000 mg | ORAL_TABLET | Freq: Two times a day (BID) | ORAL | Status: DC

## 2018-11-24 NOTE — Progress Notes (Signed)
Return phone call received from daughter [name]. She conference called with her brother. Update given and all questions answered. Will continue to monitor patient.

## 2018-11-24 NOTE — Progress Notes (Signed)
eLink Physician-Brief Progress Note Patient Name: [name] [name] DOB: 10-06-1950 MRN: [id number]   Date of Service  11/24/2018  HPI/Events of Note  CXR review.  eICU Interventions  ETT and NGT in good position, no evidence of pneumothorax, stable SQ emphysema.        [name] 11/24/2018, 1:47 AM

## 2018-11-24 NOTE — Progress Notes (Signed)
PROGRESS NOTE  [name] [name] SPQ:[id number] DOB: 05-31-1950 DOA: 10/29/2018  PCP: Nicholos Johns, MD  Brief History/Interval Summary: 68 y.o. female, with history of morbid obesity, DM type II, COPD not on oxygen, hypertension, CKD 3 baseline creatinine around 1.4, chronic atrial fibrillation [place] vas 2 score of at least 3 on Xarelto and Cardizem, spine surgeries in the past, chronic fluid retention, history of Graves' disease now hypothyroid, history of CVA, history of DVT who lives at [facility] and was likely exposed to COVID-19 infection about 10 to 14 days prior to this admission.    She was experiencing fever, body aches and developed shortness of breath.  She finally decided to come into the emergency department at [facility]  She was found to be positive for COVID-19.  Was noted to have hypoxia.  She was hospitalized for further management.  Reason for Visit: Acute respiratory failure with hypoxia due to COVID-19  Consultants: Pulmonology  Procedures:   Transthoracic echocardiogram 7/24 1. The left ventricle has hyperdynamic systolic function, with an ejection fraction of >65%. The cavity size was normal. Left ventricular diastolic Doppler parameters are consistent with impaired relaxation.  2. The right ventricle has normal systolc function. The cavity was normal. There is no increase in right ventricular wall thickness. Right ventricular systolic pressure is normal.  3. Mild thickening of the aortic valve. No stenosis of the aortic valve.  4. The aortic root and ascending aorta are normal in size and structure.    Antibiotics: Anti-infectives (From admission, onward)   Start     Dose/Rate Route Frequency Ordered Stop   11/19/18 2030  remdesivir 100 mg in sodium chloride 0.9 % 250 mL IVPB     100 mg 500 mL/hr over 30 Minutes Intravenous Every 24 hours 11/04/2018 1855 11/22/18 2204   11/24/2018 2030  remdesivir 200 mg in sodium chloride 0.9 % 250 mL IVPB     200 mg  500 mL/hr over 30 Minutes Intravenous Once 11/11/2018 1855 11/09/2018 2056       Subjective/Interval History: Patient remains intubated and sedated.  She had A. fib with RVR overnight.  Had to be given boluses of amiodarone with improvement.  Her Levophed was switched over to Neo-Synephrine.    Assessment/Plan:  Acute Hypoxic Resp. Failure due to Acute Covid 19 Viral Illness  COVID-19 Labs  Recent Labs    11/22/18 0550 11/23/18 0500 11/24/18 0505  DDIMER 10.92* 14.31* 13.58*  FERRITIN 855*  --   --   LDH 435* 433*  --   CRP 6.9* 5.0*  --     Lab Results  Component Value Date   SARSCOV2NAA POSITIVE (A) 10/30/2018     Fever: Afebrile Oxygen requirements: Mechanical ventilation.  70% FiO2.  Saturating in the late 80s.   Antibacterials: None Remdesivir: Completed course Steroids: Dexamethasone 6 mg once daily Diuretics: She is getting Lasix intermittently.  1 dose to be given today. Actemra: Has received 2 doses, 7/22 and 7/23 Vitamin C and Zinc: Continue DVT Prophylaxis: Patient transitioned from rivaroxaban to IV heparin  Patient is intubated sedated on mechanical ventilation.  Pulmonology is following.  CVP is 10.  She will be given Lasix today.  From a COVID-19 standpoint patient remains on steroids.  She has completed course of Remdesivir.  Steroid is also being tapered down.  She is also received 2 doses of Actemra.  CRP improved to 5.0.  D-dimer was rising and peaked at 14.31.  Improved to 13.58 today.  Patient is  already on rivaroxaban.  Leukocytosis is probably due to steroids but will need close monitoring.   Subcutaneous emphysema Overnight on 7/26 she was also found to have subcutaneous emphysema.  Chest x-ray also which suggests mediastinal emphysema.  Rest x-ray was repeated this morning which showed stable findings.  No indication for chest tube placement.  Continue to monitor.    Bleeding from oral cavity Continue to monitor for now.  Acute renal failure  on chronic kidney disease stage III per kalemia Baseline renal function not entirely known.  There is a creatinine measurement from 2017 of 1.28.  Creatinine was 0.9 in August 2018.   Creatinine has been elevated here in the hospital along with her BUN.  She was given Lasix for poor urine output with brisk diuresis.  BUN and creatinine increased yesterday so Lasix was held.  Improved this morning.  We will give her a dose of Lasix today.  Potassium noted to be 5.2.  Will be given Lokelma.  She has chronic lower extremity edema, right greater than left.  She does take diuretics at home which was placed on hold at admission.  ARB is on hold.    History of chronic atrial fibrillation/episodes of V. tach Chads 2 vascular score is at least 3.  Patient was noted to have V. tach after she was intubated and she was placed on amiodarone infusion which was held due to bradycardia.   She has had atrial fibrillation with RVR over the last 24 hours requiring reinstitution of amiodarone infusion.  Heart rate is better controlled today.  We will transition her to amiodarone through feeding tube. High-sensitivity troponin I was 81 and then 74.  Not significantly elevated.  Echocardiogram normal systolic function.  Diastolic dysfunction was noted.  No significant valvular abnormalities.  History of asthma versus COPD Continue her home medications.  She is not on home oxygen.  Hypothyroidism Continue levothyroxine.  As noted above her TSH was noted to be low.  Free T4 is 1.0.  No further work-up.  Low TSH likely sick euthyroid.  History of stroke Patient is on Xarelto and aspirin.  Continue on IV heparin and aspirin.  Not noted to be on statin.  Reason for this is not entirely clear.  This can be addressed when she is over her acute illness.  History of vitamin D deficiency Continue supplementation.  History of DVT She was on Xarelto.  Currently on IV heparin.  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  Stable.  Diabetes mellitus type 2 with hyperglycemia CBGs are still poorly controlled.  Continue to adjust dose of Levemir.  HbA1c 7.0.  Elevated CBG most likely due to steroids.  Should improve as steroid is tapered down.    Morbid obesity BMI 42.43.  Nutrition Continue tube feedings.  DVT Prophylaxis: Patient is on rivaroxaban PUD Prophylaxis: On Protonix Code Status: Full code Family Communication: PCCM has been calling family Disposition Plan: She will remain in the ICU.   Medications:  Scheduled: . artificial tears  1 application Both Eyes [id number]  . aspirin  81 mg Per Tube Daily  . chlorhexidine gluconate (MEDLINE KIT)  15 mL Mouth Rinse BID  . cholecalciferol  2,000 Units Oral Daily  . dexamethasone (DECADRON) injection  6 mg Intravenous Q24H  . feeding supplement (PRO-STAT SUGAR FREE 64)  30 mL Per Tube QID  . feeding supplement (VITAL AF 1.2 CAL)  1,000 mL Per Tube Q24H  .  ferrous sulfate  300 mg Per Tube TID WC  . fluticasone furoate-vilanterol  1 puff Inhalation Daily  . gabapentin  100 mg Per Tube Q8H  . insulin aspart  0-20 Units Subcutaneous Q4H  . insulin detemir  40 Units Subcutaneous BID  . levothyroxine  112 mcg Oral Q0600  . mouth rinse  15 mL Mouth Rinse 10 times per day  . multivitamin with minerals  1 tablet Per Tube Daily  . pantoprazole sodium  40 mg Per Tube Daily  . polyethylene glycol  17 g Oral Daily  . vitamin C  500 mg Oral Daily  . zinc sulfate  220 mg Oral Daily   Continuous: . sodium chloride Stopped (11/23/18 0159)  . amiodarone 30 mg/hr (11/24/18 0845)  . fentaNYL infusion INTRAVENOUS 250 mcg/hr (11/24/18 0903)  . heparin 1,050 Units/hr (11/24/18 0937)  . magnesium sulfate bolus IVPB    . midazolam 8 mg/hr (11/24/18 0845)  . midazolam 8 mg/hr (11/24/18 1015)  . phenylephrine (NEO-SYNEPHRINE) Adult infusion 15 mcg/min (11/24/18 0845)   YOV:ZCHYIF  chloride, acetaminophen **OR** acetaminophen, albuterol, bisacodyl, diltiazem, docusate sodium, fentaNYL, midazolam, midazolam, sennosides, traZODone, vecuronium   Objective:  Vital Signs  Vitals:   11/24/18 0945 11/24/18 1000 11/24/18 1015 11/24/18 1042  BP: 90/60 (!) 94/57 97/61   Pulse: 71 74 77   Resp:      Temp:      TempSrc:      SpO2: (!) 89% (!) 88% (!) 89% (!) 87%  Weight:      Height:        Intake/Output Summary (Last 24 hours) at 11/24/2018 1048 Last data filed at 11/24/2018 0845 Gross per 24 hour  Intake 3555.39 ml  Output 1465 ml  Net 2090.39 ml   Filed Weights   11/21/18 0500 11/23/18 0500 11/24/18 0500  Weight: 112.1 kg 111.5 kg 112.2 kg    General appearance: Intubated and sedated Resp: Coarse breath sounds bilaterally.  Crackles at the bases.  No wheezing or rhonchi.   Cardio: S1-S2 is irregularly irregular.  No S3-S4. GI: Abdomen is soft.  Nontender nondistended.  Bowel sounds are present normal.  No masses organomegaly Extremities: Mild edema noted in extremities.  She does have chronic lower extremity edema right greater than left. Neurologic: Sedated     Lab Results:  Data Reviewed: I have personally reviewed following labs and imaging studies  CBC: Recent Labs  Lab 11/19/18 0558  11/20/18 0651  11/21/18 0410  11/22/18 0550 11/22/18 1815 11/23/18 0500 11/23/18 1201 11/24/18 0505  WBC 10.1  --  16.6*  --  16.6*  --  17.4*  --  17.8*  --  19.8*  NEUTROABS 8.8*  --  13.9*  --  14.8*  --  15.7*  --   --   --   --   HGB 12.6   < > 12.8   < > 12.7   < > 12.9 13.6 12.6 13.3 12.3  HCT 39.6   < > 42.1   < > 41.1   < > 42.8 40.0 42.5 39.0 41.7  MCV 86.1  --  87.9  --  89.3  --  88.4  --  89.7  --  89.9  PLT 136*  --  186  --  179  --  179  --  180  --  177   < > = values in this interval not displayed.    Basic Metabolic Panel: Recent Labs  Lab 11/19/18 2020 11/20/18 0277  11/20/18 1611  11/21/18 0410  11/21/18 1630 11/22/18 0550  11/22/18 1815 11/23/18 0500 11/23/18 1201 11/24/18 0505  NA 137 136   < >  --    < > 138   < >  --  142 139 143 143 145  K 5.4* 5.4*   < >  --    < > 5.0   < >  --  4.8 4.9 5.1 4.8 5.2*  CL 101 101  --   --   --  105  --   --  105  --  105  --  109  CO2 23 23  --   --   --  24  --   --  28  --  28  --  29  GLUCOSE 274* 247*  --   --   --  322*  --   --  287*  --  329*  --  325*  BUN 34* 39*  --   --   --  49*  --   --  57*  --  80*  --  76*  CREATININE 1.24* 1.35*  --   --   --  1.57*  --   --  1.51*  --  1.74*  --  1.35*  CALCIUM 8.8* 8.6*  --   --   --  8.4*  --   --  8.5*  --  8.5*  --  8.2*  MG 2.2 2.1  --  2.2  --  2.2  --  2.3  --   --   --   --   --   PHOS 5.5* 4.1  --  5.0*  --  3.9  --  4.2  --   --   --   --   --    < > = values in this interval not displayed.    GFR: Estimated Creatinine Clearance: 48 mL/min (A) (by C-G formula based on SCr of 1.35 mg/dL (H)).  Liver Function Tests: Recent Labs  Lab 11/19/18 0558 11/19/18 2020 11/20/18 0651 11/21/18 0410 11/22/18 0550  AST 49* 71* 52* 36 27  ALT 30 44 41 35 33  ALKPHOS 80 101 96 91 88  BILITOT 0.7 0.8 0.5 0.4 0.5  PROT 7.1 7.2 7.0 6.4* 6.4*  ALBUMIN 2.8* 2.7* 2.6* 2.5* 2.5*      CBG: Recent Labs  Lab 11/23/18 1629 11/23/18 2002 11/23/18 2343 11/24/18 0327 11/24/18 0730  GLUCAP 345* 341* 341* 298* 281*     Anemia Panel: Recent Labs    11/22/18 0550  FERRITIN 855*    Recent Results (from the past 240 hour(s))  SARS Coronavirus 2 (CEPHEID- Performed in [facility] hospital lab), Hosp Order     Status: Abnormal   Collection Time: 11/08/2018  8:29 PM   Specimen: Nasopharyngeal Swab  Result Value Ref Range Status   SARS Coronavirus 2 POSITIVE (A) NEGATIVE Final    Comment: CRITICAL RESULT CALLED TO, READ BACK BY AND VERIFIED WITH: RN P HOWARD AT 0106 11/19/18 CRUICKSHANK A (NOTE) If result is NEGATIVE SARS-CoV-2 target nucleic acids are NOT DETECTED. The SARS-CoV-2 RNA is generally detectable in  upper and lower  respiratory specimens during the acute phase of infection. The lowest  concentration of SARS-CoV-2 viral copies this assay can detect is 250  copies / mL. A negative result does not preclude SARS-CoV-2 infection  and should not be used as the sole basis for treatment or other  patient management decisions.  A negative result may occur with  improper specimen collection / handling, submission of specimen other  than nasopharyngeal swab, presence of viral mutation(s) within the  areas targeted by this assay, and inadequate number of viral copies  (<250 copies / mL). A negative result must be combined with clinical  observations, patient history, and epidemiological information. If result is POSITIVE SARS-CoV-2 target nucleic acids a re DETECTED. The SARS-CoV-2 RNA is generally detectable in upper and lower  respiratory specimens during the acute phase of infection.  Positive  results are indicative of active infection with SARS-CoV-2.  Clinical  correlation with patient history and other diagnostic information is  necessary to determine patient infection status.  Positive results do  not rule out bacterial infection or co-infection with other viruses. If result is PRESUMPTIVE POSTIVE SARS-CoV-2 nucleic acids MAY BE PRESENT.   A presumptive positive result was obtained on the submitted specimen  and confirmed on repeat testing.  While 2019 novel coronavirus  (SARS-CoV-2) nucleic acids may be present in the submitted sample  additional confirmatory testing may be necessary for epidemiological  and / or clinical management purposes  to differentiate between  SARS-CoV-2 and other Sarbecovirus currently known to infect humans.  If clinically indicated additional testing with an alternate test  methodolo gy [phone]) is advised. The SARS-CoV-2 RNA is generally  detectable in upper and lower respiratory specimens during the acute  phase of infection. The expected result is  Negative. Fact Sheet for Patients:  StrictlyIdeas.no Fact Sheet for Healthcare Providers: BankingDealers.co.za This test is not yet approved or cleared by the [place] FDA and has been authorized for detection and/or diagnosis of SARS-CoV-2 by FDA under an Emergency Use Authorization (EUA).  This EUA will remain in effect (meaning this test can be used) for the duration of the COVID-19 declaration under Section 564(b)(1) of the Act, 21 U.S.C. section 360bbb-3(b)(1), unless the authorization is terminated or revoked sooner. Performed at [facility], [place] [address], [place], [place] [address]   MRSA PCR Screening     Status: None   Collection Time: 11/06/2018 11:15 PM   Specimen: Nasal Mucosa; Nasopharyngeal  Result Value Ref Range Status   MRSA by PCR NEGATIVE NEGATIVE Final    Comment:        The GeneXpert MRSA Assay (FDA approved for NASAL specimens only), is one component of a comprehensive MRSA colonization surveillance program. It is not intended to diagnose MRSA infection nor to guide or monitor treatment for MRSA infections. Performed at [facility], [place] [address], [place], [place] [address]       Radiology Studies: Dg Chest Port 1 View  Result Date: 11/24/2018 CLINICAL DATA:  Respiratory failure EXAM: PORTABLE CHEST 1 VIEW COMPARISON:  11/23/2018, 11/22/2018, 11/21/2018, 11/19/2018 FINDINGS: Endotracheal tube tip is about 2 cm superior to the carina. Esophageal tube tip is below the diaphragm but non included. Left IJ central venous catheter tip projects over the upper mediastinum. Elevation of right diaphragm. Overall no significant change in left greater than right pulmonary infiltrates. Aortic atherosclerosis. No pneumothorax. Moderate subcutaneous emphysema over the supraclavicular fossa and the chest wall on the right. IMPRESSION: 1. Support lines and tubes as above 2. Overall no  significant change in left greater than right pulmonary infiltrate since the prior exam 3. Continued moderate subcutaneous emphysema over the chest walls and supraclavicular fossa Electronically Signed   By: Donavan Foil M.D.   On: 11/24/2018 02:48   Dg Chest [facility] [address]  Result Date: 11/23/2018 CLINICAL DATA:  Change in heart rate and blood pressure. EXAM: PORTABLE CHEST 1 VIEW COMPARISON:  Chest x-rays dated 11/23/2018 and 11/22/2018. FINDINGS: Support apparatus appears stable in position. Persistent perihilar and bibasilar opacities, likely a combination of pulmonary edema and bibasilar atelectasis. No pleural effusion or pneumothorax seen. Subcutaneous emphysema again noted at the level of the upper chest and lower neck. IMPRESSION: Stable chest x-ray. Persistent perihilar and bibasilar opacities, likely a combination of pulmonary edema and bibasilar atelectasis. Persistent subcutaneous emphysema within the upper chest and lower neck. No underlying pneumothorax seen. Electronically Signed   By: Franki Cabot M.D.   On: 11/23/2018 19:14   Dg Chest Port 1 View  Result Date: 11/23/2018 CLINICAL DATA:  Subcutaneous emphysema. EXAM: PORTABLE CHEST 1 VIEW COMPARISON:  11/22/2018. FINDINGS: Endotracheal tube, left IJ line, NG tube in stable position. Heart size stable. Bilateral pulmonary infiltrates unchanged. Mild basilar atelectasis. No pleural effusion or pneumothorax. Diffuse bilateral neck and right lateral chest wall subcutaneous emphysema again noted. No interim change. IMPRESSION: 1.  Lines and tubes in stable position. 2.  Bilateral pulmonary infiltrates unchanged. 3. Bilateral neck and right lateral chest wall subcutaneous emphysema again noted without interim change. No pneumothorax noted. Chest is unchanged from prior exam. Electronically Signed   By: Marcello Moores  Register   On: 11/23/2018 06:53   Dg Chest Port 1 View  Result Date: 11/22/2018 CLINICAL DATA:  Subcutaneous emphysema, COVID positive  patient EXAM: PORTABLE CHEST 1 VIEW COMPARISON:  11/21/2018 FINDINGS: Endotracheal tube terminates 3 cm above the carina. Left IJ venous catheter terminates at the junction of the distal left brachiocephalic vein and SVC. Enteric tube courses of the stomach. Mild patchy bilateral pulmonary opacities in the right perihilar lung and left upper and lower lobes. No pleural effusion. No pneumothorax is seen. Subcutaneous emphysema in the bilateral neck and right lateral chest wall/axilla, new. IMPRESSION: New subcutaneous emphysema in the bilateral neck and right lateral chest wall/axilla. No pneumothorax is seen. Mild patchy opacities in the lungs bilaterally, left greater than right, in this patient with known COVID. Endotracheal tube terminates 3 cm above the carina. Additional support apparatus as above. Electronically Signed   By: Julian Hy M.D.   On: 11/22/2018 22:57       LOS: 6 days   [facility] Pager on [email or web address]  11/24/2018, 10:48 AM

## 2018-11-24 NOTE — Progress Notes (Signed)
ANTICOAGULATION CONSULT NOTE - Follow Up Consult  Pharmacy Consult for Heparin Indication: Afib, Hx DVT on PTA Xarelto  Allergies  Allergen Reactions  . Sulfonamide Derivatives Itching    Patient Measurements: Height: 5\' 3"  (160 cm) Weight: 247 lb 5.7 oz (112.2 kg) IBW/kg (Calculated) : 52.4 Heparin Dosing Weight: 79 kg  Vital Signs: Temp: 98.2 F (36.8 C) (07/28 1700) Temp Source: Oral (07/28 1700) BP: 86/63 (07/28 1715) Pulse Rate: 97 (07/28 1715)  Labs: Recent Labs    11/22/18 0550  11/23/18 0500 11/23/18 1201 11/23/18 1300  11/24/18 0505 11/24/18 1613 11/24/18 1815  HGB 12.9   < > 12.6 13.3  --   --  12.3  --   --   HCT 42.8   < > 42.5 39.0  --   --  41.7  --   --   PLT 179  --  180  --   --   --  177  --   --   APTT  --   --   --   --  25   < > 155* 195* 184*  HEPARINUNFRC  --   --   --   --  >2.20*  --  2.00*  --   --   CREATININE 1.51*  --  1.74*  --   --   --  1.35*  --   --    < > = values in this interval not displayed.    Estimated Creatinine Clearance: 48 mL/min (A) (by C-G formula based on SCr of 1.35 mg/dL (H)).   Medications: Infusions:  . sodium chloride Stopped (11/23/18 0159)  . fentaNYL infusion INTRAVENOUS 250 mcg/hr (11/24/18 1700)  . midazolam 8 mg/hr (11/24/18 1700)  . midazolam 8 mg/hr (11/24/18 1019)  . phenylephrine (NEO-SYNEPHRINE) Adult infusion 15 mcg/min (11/24/18 1700)    Assessment: 25 yoF admitted on 7/22 with PMH of afib and DVT on chronic Xarelto.  She has developed subcutaneous emphysema and may need chest tube in the future (not yet per MD) and Pharmacy is consulted to change from Xarelto to Heparin infusion.  Last Xarelto dose given on 7/26 at 1700.  Today, 11/24/2018:  APTT = 195, rechecked to verify = 184 supratherapeutic level despite holding heparin for 1 hour this morning and decreased rate to 1050 units/hr.  RN this evening reports that bleeding in oral cavity noted earlier may be slightly improved.  Goal of  Therapy:  Heparin level 0.3-0.7 units/ml aPTT 66-102 seconds Monitor platelets by anticoagulation protocol: Yes   Plan:   HOLD heparin x1 hour  Decrease to heparin IV infusion 750 units/hr  APTT 6 hours after rate change.  Daily heparin level and CBC, APTT.  Follow up plans for restart of oral/enteral anticoagulation.   Peggyann Juba, PharmD, BCPS Pharmacy: [phone] Clinical pharmacist phone 7am- 5pm: [phone] 11/24/2018 7:48 PM

## 2018-11-24 NOTE — Progress Notes (Signed)
Inpatient Diabetes Program Recommendations  AACE/ADA: New Consensus Statement on Inpatient Glycemic Control (2015)  Target Ranges:  Prepandial:   less than 140 mg/dL      Peak postprandial:   less than 180 mg/dL (1-2 hours)      Critically ill patients:  140 - 180 mg/dL   Lab Results  Component Value Date   GLUCAP 281 (H) 11/24/2018   HGBA1C 7.0 (H) 11/02/2018    Review of Glycemic Control Results for [name] [name], [name] [name] (MRN [id number]) as of 11/24/2018 10:23  Ref. Range 11/23/2018 16:29 11/23/2018 20:02 11/23/2018 23:43 11/24/2018 03:27 11/24/2018 07:30  Glucose-Capillary Latest Ref Range: 70 - 99 mg/dL 345 (H) 341 (H) 341 (H) 298 (H) 281 (H)   Diabetes history: DM 2 Outpatient Diabetes medications: None Current orders for Inpatient glycemic control:  Novolog resistant q 4 hours, Levemir 40 units bid Inpatient Diabetes Program Recommendations:    Please consider adding Novolog tube feed coverage 4 units q 4 hours.   Thanks  Adah Perl, RN, BC-ADM Inpatient Diabetes Coordinator Pager [phone] (8a-5p)

## 2018-11-24 NOTE — Progress Notes (Signed)
NAME:  [name] [name], MRN:  [id number], DOB:  1950/07/01, LOS: 6 ADMISSION DATE:  11/23/2018, CONSULTATION DATE: July 23 REFERRING MD: Dr. Candiss Norse, CHIEF COMPLAINT: Dyspnea  Brief History   68 year old female admitted for severe acute respiratory failure with hypoxemia due to COVID-19 pneumonia.  Past Medical History  Diabetes mellitus type 2 Obesity Asthma, has been on inhalers in the past, never smoked cigarettes CKD stage III Chronic A. fib History of spine surgery History of Graves' disease, now hypothyroid History of stroke History of DVT Lives in an assisted living facility [facility] Events   July 22 admitted July 23 intubated July 24 prone positioning July 26 subcutaneous air, no pneumothorax, afib with RVR again  Consults:  PCCM  Procedures:  7/23 ETT > 7/23 L IJ CVL >  Significant Diagnostic Tests:  7/24 Echo > LVEF > 65%, impaired relaxation, RV normal systolic function, mild thickening of aortic valve  Micro Data:  July 22 SARS COV 2 Positive  Antimicrobials:  July 22 remdesivir> 7/26 July 22 decadron July 22> 23 Toclizumab   Interim history/subjective:   Had subcutaneous air pop up overnight July 26, has been stable since but has had some A. fib with RVR.  No evidence of pneumothorax on chest imaging.  Amiodarone has controlled A. fib with RVR  Objective   Blood pressure 128/63, pulse 71, temperature 97.8 F (36.6 C), temperature source Oral, resp. rate (!) 32, height 5\' 3"  (1.6 m), weight 112.2 kg, SpO2 96 %. CVP:  [9 mmHg-21 mmHg] 10 mmHg  Vent Mode: PRVC FiO2 (%):  [45 %-60 %] 60 % Set Rate:  [30 bmp] 30 bmp Vt Set:  [360 mL] 360 mL PEEP:  [8 cmH20] 8 cmH20 Plateau Pressure:  [16 cmH20-21 cmH20] 16 cmH20   Intake/Output Summary (Last 24 hours) at 11/24/2018 0755 Last data filed at 11/24/2018 0600 Gross per 24 hour  Intake 2825.43 ml  Output 1815 ml  Net 1010.43 ml   Filed Weights   11/21/18 0500 11/23/18 0500 11/24/18  0500  Weight: 112.1 kg 111.5 kg 112.2 kg    Examination:  General:  In bed on vent HENT: NCAT ETT in place PULM: CTA B, vent supported breathing CV: RRR, no mgr GI: BS+, soft, nontender MSK: normal bulk and tone Neuro: sedated on vent Derm: crepitance around neck   7/26 CXR > images independently reviewed showing no pneumothorax, pneumomediastinum noted, bilateral airspace disease, subcutaneous air noted above clavicles bilaterally   Resolved Hospital Problem list     Assessment & Plan:  Severe acute respiratory failure with hypoxemia secondary to ARDS from COVID pneumonia: slow, steady improvement in oxygenation since admission Continue mechanical ventilation per ARDS protocol Target TVol 6-8cc/kgIBW Target Plateau Pressure < 30cm H20 Target driving pressure less than 15 cm of water Target PaO2 55-65: titrate PEEP/FiO2 per protocol As long as PaO2 to FiO2 ratio is less than 1:150 position in prone position for 16 hours a day Check CVP daily if CVL in place Target CVP less than 4, diurese as necessary Ventilator associated pneumonia prevention protocol 7/28 plan: diuresis with lasix today, no need for prone positining given improved oxygenation  Pneumomediastinum: we have seen this several times in patients with advanced ARDS, no evidence of growth or hemodynamic compromise Minimize ventilator settings/pressure Monitor exam for worsening crepitance or hemodynamic change Daily chest x-ray or as needed in the event of sudden hemodynamic change  VT 7/23 PM: has not recurred, unclear cause; electrolytes OK, Echo OK Atrial  fibrillation 7/28 improved control with amiodarone Tele Convert amiodarone to oral Use heparin infusion for now in case chest tube needed  Need for sedation for ventilator dyssynchrony: Continue neuromuscular blockade protocol for now with pneumomediastinum RASS target -4 to -5  COVID-19 pneumonia: Continue decadron, completed remdesivir  History of  DVT Heparin infusion   Best practice:  Diet: start tube feeding Pain/Anxiety/Delirium protocol (if indicated): RASS score -4 to -5 VAP protocol (if indicated): yes DVT prophylaxis: heparin infusion GI prophylaxis: protonix Glucose control: per TRH Mobility: bed rest Code Status: full Family Communication: I called her daughter [name] for an update on 7/28 Disposition: remain in ICU  Labs   CBC: Recent Labs  Lab 11/19/18 0558  11/20/18 0651  11/21/18 0410  11/22/18 0550 11/22/18 1815 11/23/18 0500 11/23/18 1201 11/24/18 0505  WBC 10.1  --  16.6*  --  16.6*  --  17.4*  --  17.8*  --  19.8*  NEUTROABS 8.8*  --  13.9*  --  14.8*  --  15.7*  --   --   --   --   HGB 12.6   < > 12.8   < > 12.7   < > 12.9 13.6 12.6 13.3 12.3  HCT 39.6   < > 42.1   < > 41.1   < > 42.8 40.0 42.5 39.0 41.7  MCV 86.1  --  87.9  --  89.3  --  88.4  --  89.7  --  89.9  PLT 136*  --  186  --  179  --  179  --  180  --  177   < > = values in this interval not displayed.    Basic Metabolic Panel: Recent Labs  Lab 11/19/18 2020 11/20/18 0651  11/20/18 1611  11/21/18 0410  11/21/18 1630 11/22/18 0550 11/22/18 1815 11/23/18 0500 11/23/18 1201 11/24/18 0505  NA 137 136   < >  --    < > 138   < >  --  142 139 143 143 145  K 5.4* 5.4*   < >  --    < > 5.0   < >  --  4.8 4.9 5.1 4.8 5.2*  CL 101 101  --   --   --  105  --   --  105  --  105  --  109  CO2 23 23  --   --   --  24  --   --  28  --  28  --  29  GLUCOSE 274* 247*  --   --   --  322*  --   --  287*  --  329*  --  325*  BUN 34* 39*  --   --   --  49*  --   --  57*  --  80*  --  76*  CREATININE 1.24* 1.35*  --   --   --  1.57*  --   --  1.51*  --  1.74*  --  1.35*  CALCIUM 8.8* 8.6*  --   --   --  8.4*  --   --  8.5*  --  8.5*  --  8.2*  MG 2.2 2.1  --  2.2  --  2.2  --  2.3  --   --   --   --   --   PHOS 5.5* 4.1  --  5.0*  --  3.9  --  4.2  --   --   --   --   --    < > =  values in this interval not displayed.   GFR: Estimated  Creatinine Clearance: 48 mL/min (A) (by C-G formula based on SCr of 1.35 mg/dL (H)). Recent Labs  Lab 11/21/18 0410 11/22/18 0550 11/23/18 0500 11/24/18 0505  WBC 16.6* 17.4* 17.8* 19.8*    Liver Function Tests: Recent Labs  Lab 11/19/18 0558 11/19/18 2020 11/20/18 0651 11/21/18 0410 11/22/18 0550  AST 49* 71* 52* 36 27  ALT 30 44 41 35 33  ALKPHOS 80 101 96 91 88  BILITOT 0.7 0.8 0.5 0.4 0.5  PROT 7.1 7.2 7.0 6.4* 6.4*  ALBUMIN 2.8* 2.7* 2.6* 2.5* 2.5*   No results for input(s): LIPASE, AMYLASE in the last 168 hours. No results for input(s): AMMONIA in the last 168 hours.  ABG    Component Value Date/Time   PHART 7.324 (L) 11/23/2018 1201   PCO2ART 59.2 (H) 11/23/2018 1201   PO2ART 62.0 (L) 11/23/2018 1201   HCO3 30.9 (H) 11/23/2018 1201   TCO2 33 (H) 11/23/2018 1201   ACIDBASEDEF 3.0 (H) 11/21/2018 0628   O2SAT 89.0 11/23/2018 1201     Coagulation Profile: No results for input(s): INR, PROTIME in the last 168 hours.  Cardiac Enzymes: No results for input(s): CKTOTAL, CKMB, CKMBINDEX, TROPONINI in the last 168 hours.  HbA1C: Hgb A1c MFr Bld  Date/Time Value Ref Range Status  11/22/2018 08:00 PM 7.0 (H) 4.8 - 5.6 % Final    Comment:    (NOTE) Pre diabetes:          5.7%-6.4% Diabetes:              >6.4% Glycemic control for   <7.0% adults with diabetes   10/18/2016 10:47 AM 6.0 (H) 4.8 - 5.6 % Final    Comment:    (NOTE)         Pre-diabetes: 5.7 - 6.4         Diabetes: >6.4         Glycemic control for adults with diabetes: <7.0     CBG: Recent Labs  Lab 11/23/18 1629 11/23/18 2002 11/23/18 2343 11/24/18 0327 11/24/18 0730  GLUCAP 345* 341* 341* 298* 281*      Critical care time: 33 minutes   Roselie Awkward, MD [facility] Pager: [phone] Cell: [phone] If no response, call [phone]

## 2018-11-24 NOTE — Progress Notes (Signed)
Afternoon assessment remains as previously charted.

## 2018-11-24 NOTE — Progress Notes (Signed)
ANTICOAGULATION CONSULT NOTE - Follow Up Consult  Pharmacy Consult for Heparin Indication: Afib, Hx DVT on PTA Xarelto  Allergies  Allergen Reactions  . Sulfonamide Derivatives Itching    Patient Measurements: Height: 5\' 3"  (160 cm) Weight: 247 lb 5.7 oz (112.2 kg) IBW/kg (Calculated) : 52.4 Heparin Dosing Weight: 79 kg  Vital Signs: Temp: 99.1 F (37.3 C) (07/28 0400) Temp Source: Axillary (07/28 0400) BP: 128/63 (07/28 0615) Pulse Rate: 71 (07/28 0615)  Labs: Recent Labs    11/22/18 0550  11/23/18 0500 11/23/18 1201 11/23/18 1300 11/24/18 0030 11/24/18 0505  HGB 12.9   < > 12.6 13.3  --   --  12.3  HCT 42.8   < > 42.5 39.0  --   --  41.7  PLT 179  --  180  --   --   --  177  APTT  --   --   --   --  25 98*  --   HEPARINUNFRC  --   --   --   --  >2.20*  --   --   CREATININE 1.51*  --  1.74*  --   --   --  1.35*   < > = values in this interval not displayed.    Estimated Creatinine Clearance: 48 mL/min (A) (by C-G formula based on SCr of 1.35 mg/dL (H)).   Medications: Infusions:  . sodium chloride Stopped (11/23/18 0159)  . amiodarone 30 mg/hr (11/24/18 0600)  . fentaNYL infusion INTRAVENOUS 250 mcg/hr (11/24/18 0600)  . heparin 1,250 Units/hr (11/24/18 0600)  . magnesium sulfate bolus IVPB    . midazolam 8 mg/hr (11/24/18 0600)  . midazolam 8 mg/hr (11/24/18 0409)  . phenylephrine (NEO-SYNEPHRINE) Adult infusion 30 mcg/min (11/24/18 0600)    Assessment: 58 yoF admitted on 7/22 with PMH of afib and DVT on chronic Xarelto.  She has developed subcutaneous emphysema and may need chest tube in the future (not yet per MD) and Pharmacy is consulted to change from Xarelto to Heparin infusion.  Last Xarelto dose given on 7/26 at 1700.  Today, 11/24/2018:  APTT 155, increased to supratherapeutic level despite same heparin rate.  Heparin level 2.0 is supratherapeutic, but falsely elevated d/t recent Xarelto.  Continue to use APTT to titrate Heparin dosing.  CBC:  Hgb 12.3, Plt 177  RN reported minor oral bleeding overnight 7/27 with instruction from [facility] MD to continue heparin.  RN this morning has noticed blood in the oral cavity (no visible source) when oral care was performed, but did not see any blood in ET tube while suctioning.  RN to discuss with rounding MD this morning.  SCr decreased to 1.35, CrCl ~ 48 ml/min.  Goal of Therapy:  Heparin level 0.3-0.7 units/ml aPTT 66-102 seconds Monitor platelets by anticoagulation protocol: Yes   Plan:   HOLD heparin x1 hour  Decrease to heparin IV infusion 1050 units/hr  APTT 6 hours after rate change.  Daily heparin level and CBC, APTT.  Follow up plans for chest tube or invasive procedures.   Gretta Arab PharmD, BCPS Clinical pharmacist phone 7am- 5pm: [phone] 11/24/2018 7:10 AM

## 2018-11-24 NOTE — Progress Notes (Signed)
ANTICOAGULATION CONSULT NOTE - Follow Up Consult  Pharmacy Consult for Heparin Indication: Afib, Hx DVT on PTA Xarelto  Allergies  Allergen Reactions  . Sulfonamide Derivatives Itching    Patient Measurements: Height: _0  (160 cm) Weight: 245 lb 13 oz (111.5 kg) IBW/kg (Calculated) : 52.4 Heparin Dosing Weight: 79 kg  Vital Signs: Temp: 99.1 F (37.3 C) (07/28 0015) Temp Source: Axillary (07/28 0015) BP: 122/45 (07/28 0200) Pulse Rate: 75 (07/28 0200)  Labs: Recent Labs    11/21/18 0410  11/22/18 0550 11/22/18 1815 11/23/18 0500 11/23/18 1201 11/23/18 1300 11/24/18 0030  HGB 12.7   < > 12.9 13.6 12.6 13.3  --   --   HCT 41.1   < > 42.8 40.0 42.5 39.0  --   --   PLT 179  --  179  --  180  --   --   --   APTT  --   --   --   --   --   --  25 98*  HEPARINUNFRC  --   --   --   --   --   --  >2.20*  --   CREATININE 1.57*  --  1.51*  --  1.74*  --   --   --    < > = values in this interval not displayed.    Estimated Creatinine Clearance: 37.1 mL/min (A) (by C-G formula based on SCr of 1.74 mg/dL (H)).   Medications:  Scheduled:  . artificial tears  1 application Both Eyes Y1P  . aspirin  81 mg Per Tube Daily  . chlorhexidine gluconate (MEDLINE KIT)  15 mL Mouth Rinse BID  . cholecalciferol  2,000 Units Oral Daily  . dexamethasone (DECADRON) injection  6 mg Intravenous Q24H  . feeding supplement (PRO-STAT SUGAR FREE 64)  30 mL Per Tube QID  . feeding supplement (VITAL AF 1.2 CAL)  1,000 mL Per Tube Q24H  . ferrous sulfate  300 mg Per Tube TID WC  . fluticasone furoate-vilanterol  1 puff Inhalation Daily  . gabapentin  100 mg Per Tube Q8H  . insulin aspart  0-20 Units Subcutaneous Q4H  . insulin detemir  30 Units Subcutaneous BID  . levothyroxine  112 mcg Oral Q0600  . mouth rinse  15 mL Mouth Rinse 10 times per day  . multivitamin with minerals  1 tablet Per Tube Daily  . pantoprazole sodium  40 mg Per Tube Daily  . polyethylene glycol  17 g Oral Daily  .  vitamin C  500 mg Oral Daily  . zinc sulfate  220 mg Oral Daily   Infusions:  . sodium chloride Stopped (11/23/18 0159)  . amiodarone 30 mg/hr (11/24/18 0205)  . fentaNYL infusion INTRAVENOUS 250 mcg/hr (11/24/18 0205)  . heparin 1,250 Units/hr (11/24/18 0205)  . magnesium sulfate bolus IVPB    . midazolam 8 mg/hr (11/24/18 0205)  . midazolam 8 mg/hr (11/23/18 1934)  . phenylephrine (NEO-SYNEPHRINE) Adult infusion 50 mcg/min (11/24/18 0201)    Assessment: 28 yoF admitted on 7/22 with PMH of afib and DVT on chronic Xarelto.  She has developed subcutaneous emphysema and may need chest tube in the future (not yet per MD).  Pharmacy is consulted to start Heparin infusion in case chest tube placement is needed.  Last Xarelto dose given on 7/26 at 1700.  Today, 11/24/2018:  Initial APTT 98, therapeutic  CBC: pending with AM labs   SCr increased to 1.74, CrCl ~ 37 ml/min. Updated  Scr pending with AM labs  RN reported some bleeding issues to MD. Bleeding issues related to small amount of blood in oral secretions. RN also reports that MD stated to continue drip as is for now.   Goal of Therapy:  Heparin level 0.3-0.7 units/ml aPTT 66-102 seconds Monitor platelets by anticoagulation protocol: Yes   Plan:   Continue  heparin IV infusion at 1250 units/hr  Confirmatory aPTT 6 hours after prior one  CBC ordered with AM labs   Monitor for signs and symptoms of bleeding     Royetta Asal, PharmD, BCPS 11/24/2018 2:35 AM

## 2018-11-25 ENCOUNTER — Inpatient Hospital Stay (HOSPITAL_COMMUNITY): Payer: Medicare Other

## 2018-11-25 DIAGNOSIS — Z9911 Dependence on respirator [ventilator] status: Secondary | ICD-10-CM

## 2018-11-25 LAB — BASIC METABOLIC PANEL
Anion gap: 6 (ref 5–15)
BUN: 97 mg/dL — ABNORMAL HIGH (ref 8–23)
CO2: 30 mmol/L (ref 22–32)
Calcium: 7.8 mg/dL — ABNORMAL LOW (ref 8.9–10.3)
Chloride: 110 mmol/L (ref 98–111)
Creatinine, Ser: 1.52 mg/dL — ABNORMAL HIGH (ref 0.44–1.00)
GFR calc Af Amer: 40 mL/min — ABNORMAL LOW (ref >60)
GFR calc non Af Amer: 35 mL/min — ABNORMAL LOW (ref >60)
Glucose, Bld: 278 mg/dL — ABNORMAL HIGH (ref 70–99)
Potassium: 5.5 mmol/L — ABNORMAL HIGH (ref 3.5–5.1)
Sodium: 146 mmol/L — ABNORMAL HIGH (ref 135–145)

## 2018-11-25 LAB — CBC
HCT: 37.3 % (ref 36.0–46.0)
Hemoglobin: 11.1 g/dL — ABNORMAL LOW (ref 12.0–15.0)
MCH: 27.1 pg (ref 26.0–34.0)
MCHC: 29.8 g/dL — ABNORMAL LOW (ref 30.0–36.0)
MCV: 91 fL (ref 80.0–100.0)
Platelets: 144 10*3/uL — ABNORMAL LOW (ref 150–400)
RBC: 4.1 MIL/uL (ref 3.87–5.11)
RDW: 13.7 % (ref 11.5–15.5)
WBC: 19.6 10*3/uL — ABNORMAL HIGH (ref 4.0–10.5)
nRBC: 1 % — ABNORMAL HIGH (ref 0.0–0.2)

## 2018-11-25 LAB — GLUCOSE, CAPILLARY
Glucose-Capillary: 237 mg/dL — ABNORMAL HIGH (ref 70–99)
Glucose-Capillary: 253 mg/dL — ABNORMAL HIGH (ref 70–99)
Glucose-Capillary: 257 mg/dL — ABNORMAL HIGH (ref 70–99)
Glucose-Capillary: 258 mg/dL — ABNORMAL HIGH (ref 70–99)
Glucose-Capillary: 291 mg/dL — ABNORMAL HIGH (ref 70–99)
Glucose-Capillary: 311 mg/dL — ABNORMAL HIGH (ref 70–99)

## 2018-11-25 LAB — APTT: aPTT: 133 seconds — ABNORMAL HIGH (ref 24–36)

## 2018-11-25 LAB — C-REACTIVE PROTEIN: CRP: 2.3 mg/dL — ABNORMAL HIGH (ref <1.0)

## 2018-11-25 LAB — HEPARIN LEVEL (UNFRACTIONATED): Heparin Unfractionated: 1.64 IU/mL — ABNORMAL HIGH (ref 0.30–0.70)

## 2018-11-25 LAB — D-DIMER, QUANTITATIVE: D-Dimer, Quant: 9.59 ug/mL-FEU — ABNORMAL HIGH (ref 0.00–0.50)

## 2018-11-25 MED ORDER — POLYETHYLENE GLYCOL 3350 17 G PO PACK
17.0000 g | PACK | Freq: Three times a day (TID) | ORAL | Status: DC
  Administered 2018-11-25: 17 g
  Filled 2018-11-25: qty 1

## 2018-11-25 MED ORDER — FLEET ENEMA 7-19 GM/118ML RE ENEM: 1.0000 | ENEMA | Freq: Every day | RECTAL | Status: DC | PRN

## 2018-11-25 MED ORDER — AMIODARONE IV BOLUS ONLY 150 MG/100ML
150.0000 mg | Freq: Once | INTRAVENOUS | Status: AC
  Administered 2018-11-25: 150 mg via INTRAVENOUS
  Filled 2018-11-25: qty 100

## 2018-11-25 MED ORDER — BISACODYL 10 MG RE SUPP: 10.0000 mg | Freq: Once | RECTAL | Status: DC

## 2018-11-25 MED ORDER — SODIUM ZIRCONIUM CYCLOSILICATE 10 G PO PACK
10.0000 g | PACK | Freq: Three times a day (TID) | ORAL | Status: AC
  Administered 2018-11-25 (×2): 10 g via ORAL
  Filled 2018-11-25 (×2): qty 1

## 2018-11-25 MED ORDER — METOPROLOL TARTRATE 5 MG/5ML IV SOLN
5.0000 mg | Freq: Once | INTRAVENOUS | Status: AC
  Administered 2018-11-25: 11:00:00 5 mg via INTRAVENOUS
  Filled 2018-11-25: qty 5

## 2018-11-25 MED ORDER — FUROSEMIDE 10 MG/ML IJ SOLN
40.0000 mg | Freq: Four times a day (QID) | INTRAMUSCULAR | Status: AC
  Administered 2018-11-25 (×2): 40 mg via INTRAVENOUS
  Filled 2018-11-25 (×2): qty 4

## 2018-11-25 MED ORDER — DEXAMETHASONE SODIUM PHOSPHATE 4 MG/ML IJ SOLN
4.0000 mg | INTRAMUSCULAR | Status: DC
  Administered 2018-11-26 – 2018-12-01 (×6): 4 mg via INTRAVENOUS
  Filled 2018-11-25 (×6): qty 1

## 2018-11-25 MED ORDER — METOPROLOL TARTRATE 5 MG/5ML IV SOLN
5.0000 mg | Freq: Four times a day (QID) | INTRAVENOUS | Status: DC | PRN
  Administered 2018-11-25 – 2018-11-27 (×2): 5 mg via INTRAVENOUS
  Filled 2018-11-25 (×2): qty 5

## 2018-11-25 MED ORDER — SENNOSIDES 8.8 MG/5ML PO SYRP
10.0000 mL | ORAL_SOLUTION | Freq: Two times a day (BID) | ORAL | Status: DC
  Administered 2018-11-25: 22:00:00 10 mL
  Filled 2018-11-25: qty 10

## 2018-11-25 MED ORDER — RIVAROXABAN 20 MG PO TABS
20.0000 mg | ORAL_TABLET | Freq: Every day | ORAL | Status: DC
  Administered 2018-11-25: 20 mg via ORAL
  Filled 2018-11-25 (×2): qty 1

## 2018-11-25 MED ORDER — METOPROLOL TARTRATE 5 MG/5ML IV SOLN
5.0000 mg | Freq: Once | INTRAVENOUS | Status: AC
  Administered 2018-11-25: 10:00:00 5 mg via INTRAVENOUS
  Filled 2018-11-25: qty 5

## 2018-11-25 MED ORDER — METOLAZONE 2.5 MG PO TABS
2.5000 mg | ORAL_TABLET | Freq: Once | ORAL | Status: AC
  Administered 2018-11-25: 2.5 mg via ORAL
  Filled 2018-11-25: qty 1

## 2018-11-25 MED ORDER — METOPROLOL TARTRATE 25 MG PO TABS
25.0000 mg | ORAL_TABLET | Freq: Three times a day (TID) | ORAL | Status: DC
  Administered 2018-11-25 – 2018-11-26 (×2): 25 mg
  Filled 2018-11-25 (×2): qty 1

## 2018-11-25 MED ORDER — HEPARIN (PORCINE) 25000 UT/250ML-% IV SOLN: 450.0000 [IU]/h | INTRAVENOUS | Status: DC

## 2018-11-25 MED ORDER — METOPROLOL TARTRATE 25 MG PO TABS
25.0000 mg | ORAL_TABLET | Freq: Three times a day (TID) | ORAL | Status: DC
  Administered 2018-11-25: 12:00:00 25 mg via ORAL
  Filled 2018-11-25: qty 1

## 2018-11-25 NOTE — Progress Notes (Signed)
PROGRESS NOTE  [name] [name] EHU:[id number] DOB: [birth year] DOA: 11/01/2018  PCP: Nicholos Johns, MD  Brief History/Interval Summary: 68 y.o. female, with history of morbid obesity, DM type II, COPD not on oxygen, hypertension, CKD 3 baseline creatinine around 1.4, chronic atrial fibrillation [place] vas 2 score of at least 3 on Xarelto and Cardizem, spine surgeries in the past, chronic fluid retention, history of Graves' disease now hypothyroid, history of CVA, history of DVT who lives at [facility] and was likely exposed to COVID-19 infection about 10 to 14 days prior to this admission.    She was experiencing fever, body aches and developed shortness of breath.  She finally decided to come into the emergency department at [facility].  She was found to be positive for COVID-19.  Was noted to have hypoxia.  She was hospitalized for further management.  Reason for Visit: Acute respiratory failure with hypoxia due to COVID-19  Consultants: Pulmonology  Procedures:   Transthoracic echocardiogram 7/24 1. The left ventricle has hyperdynamic systolic function, with an ejection fraction of >65%. The cavity size was normal. Left ventricular diastolic Doppler parameters are consistent with impaired relaxation.  2. The right ventricle has normal systolc function. The cavity was normal. There is no increase in right ventricular wall thickness. Right ventricular systolic pressure is normal.  3. Mild thickening of the aortic valve. No stenosis of the aortic valve.  4. The aortic root and ascending aorta are normal in size and structure.    Antibiotics: Anti-infectives (From admission, onward)   Start     Dose/Rate Route Frequency Ordered Stop   11/19/18 2030  remdesivir 100 mg in sodium chloride 0.9 % 250 mL IVPB     100 mg 500 mL/hr over 30 Minutes Intravenous Every 24 hours 11/03/2018 1855 11/22/18 2204   11/22/2018 2030  remdesivir 200 mg in sodium chloride 0.9 % 250 mL IVPB     200 mg  500 mL/hr over 30 Minutes Intravenous Once 11/27/2018 1855 11/19/2018 2056       Subjective/Interval History: Patient remains intubated and sedated.  She was being weaned this morning and went into rapid atrial fibrillation.     Assessment/Plan:  Acute Hypoxic Resp. Failure due to Acute Covid 19 Viral Illness  COVID-19 Labs  Recent Labs    11/23/18 0500 11/24/18 0505 11/25/18 0515  DDIMER 14.31* 13.58* 9.59*  LDH 433*  --   --   CRP 5.0*  --  2.3*    Lab Results  Component Value Date   SARSCOV2NAA POSITIVE (A) 11/21/2018     Fever: Remains afebrile Oxygen requirements: Mechanical ventilation.  65% FiO2.  Saturating in the 90s.    Antibacterials: None Remdesivir: Completed course Steroids: Dexamethasone 6 mg once daily.  Being tapered.  Diuretics: Received Lasix yesterday.  Consider additional dose today. Actemra: Has received 2 doses, 7/22 and 7/23 Vitamin C and Zinc: Continue DVT Prophylaxis: Patient transitioned from rivaroxaban to IV heparin.  Will be transition back to rivaroxaban today  Patient remains intubated and sedated.  Pulmonology is following.  She was given Lasix yesterday.  She was given Lasix and metolazone today.    From a COVID-19 standpoint patient has completed course of Remdesivir.  She remains on steroids which is being tapered.  Her inflammatory markers have improved.  CRP is down to 2.3.  D-dimer 9.59.  WBC is stable.   Subcutaneous emphysema/pneumomediastinum Overnight on 7/26 she was also found to have subcutaneous emphysema.  Chest x-ray also which suggests  mediastinal emphysema.  Chest x-ray findings have been stable.  No indication for chest tube placement.    Bleeding from oral cavity Seems to be stable.  Mild drop in hemoglobin noted.  Continue to monitor.  Acute renal failure on chronic kidney disease stage III per kalemia Baseline renal function not entirely known.  There is a creatinine measurement from 2017 of 1.28.  Creatinine was  0.9 in August 2018.   Creatinine has been elevated here in the hospital along with her BUN.  She was given Lasix for poor urine output with brisk diuresis.  Potassium was 5.2 yesterday.  She was given Lokelma.  Potassium noted to be 5.5 this morning.  She will be given additional doses of Lokelma today.  Recheck labs tomorrow. She has chronic lower extremity edema, right greater than left.  She does take diuretics at home which was placed on hold at admission.  ARB is on hold.    History of chronic atrial fibrillation/episodes of V. tach Chads 2 vascular score is at least 3.  Patient was noted to have V. tach after she was intubated and she was placed on amiodarone infusion which was held due to bradycardia.  No recurrence of ventricular arrhythmia.   She developed atrial fibrillation with RVR 2 days ago.  She was given amiodarone bolus and started on infusion again.  Switched over to amiodarone through feeding tube.  She again went into rapid atrial fibrillation this morning.  Was given metoprolol with no significant improvement.  Another bolus of amiodarone given.  She is now in sinus rhythm.  Continue to monitor.  Continue beta-blocker. High-sensitivity troponin I was 81 and then 74.  Not significantly elevated.  Echocardiogram normal systolic function.  Diastolic dysfunction was noted.  No significant valvular abnormalities.  History of asthma versus COPD Continue her home medications.  She is not on home oxygen.  Hypothyroidism Continue levothyroxine.  As noted above her TSH was noted to be low.  Free T4 is 1.0.  No further work-up.  Low TSH likely sick euthyroid.  History of stroke Patient is on Xarelto and aspirin.  Switched over to IV heparin in case she needed to undergo chest tube placement.  Continue on IV heparin and aspirin.   Not noted to be on statin.  Reason for this is not entirely clear.  This can be addressed when she is over her acute illness.  History of vitamin D deficiency  Continue supplementation.  History of DVT She was on Xarelto.  Currently on IV heparin.  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  Stable.  Diabetes mellitus type 2 with hyperglycemia CBGs are still poorly controlled.  Continue to adjust dose of Levemir.  HbA1c 7.0.  Elevated CBG most likely due to steroids.  Should improve as steroid is tapered down.    Morbid obesity BMI 42.43.  Nutrition Continue tube feedings.  Constipation Patient still has not had a bowel movement despite laxatives.  We will order suppositories/enemas.  DVT Prophylaxis: Will be transitioned to rivaroxaban today PUD Prophylaxis: On Protonix Code Status: Full code Family Communication: We will call her family today Disposition Plan: She will remain in the ICU.   Medications:  Scheduled: . amiodarone  400 mg Per Tube BID   Followed by  . [START ON 11/29/2018] amiodarone  200 mg Per Tube BID   Followed by  . [START ON 12/04/2018] amiodarone  200 mg Per Tube Daily  .  artificial tears  1 application Both Eyes E5U  . aspirin  81 mg Per Tube Daily  . chlorhexidine gluconate (MEDLINE KIT)  15 mL Mouth Rinse BID  . cholecalciferol  2,000 Units Oral Daily  . dexamethasone (DECADRON) injection  6 mg Intravenous Q24H  . feeding supplement (PRO-STAT SUGAR FREE 64)  30 mL Per Tube QID  . feeding supplement (VITAL AF 1.2 CAL)  1,000 mL Per Tube Q24H  . ferrous sulfate  300 mg Per Tube TID WC  . fluticasone furoate-vilanterol  1 puff Inhalation Daily  . furosemide  40 mg Intravenous Q6H  . gabapentin  100 mg Per Tube Q8H  . insulin aspart  0-20 Units Subcutaneous Q4H  . insulin aspart  4 Units Subcutaneous Q4H  . insulin detemir  45 Units Subcutaneous BID  . levothyroxine  112 mcg Oral Q0600  . mouth rinse  15 mL Mouth Rinse 10 times per day  . metoprolol tartrate  25 mg Oral Q8H  . multivitamin with minerals  1 tablet Per Tube Daily  .  pantoprazole sodium  40 mg Per Tube Daily  . polyethylene glycol  17 g Oral Daily  . rivaroxaban  20 mg Oral Q supper  . sodium zirconium cyclosilicate  10 g Oral TID  . vitamin C  500 mg Oral Daily  . zinc sulfate  220 mg Oral Daily   Continuous: . sodium chloride Stopped (11/23/18 0159)  . fentaNYL infusion INTRAVENOUS 200 mcg/hr (11/25/18 1600)  . midazolam 3 mg/hr (11/25/18 1600)  . phenylephrine (NEO-SYNEPHRINE) Adult infusion Stopped (11/25/18 1529)   DJS:HFWYOV chloride, acetaminophen **OR** acetaminophen, albuterol, bisacodyl, docusate sodium, fentaNYL, metoprolol tartrate, midazolam, sennosides, traZODone   Objective:  Vital Signs  Vitals:   11/25/18 1545 11/25/18 1600 11/25/18 1615 11/25/18 1630  BP: (!) 119/57 (!) 108/57 (!) 120/91 112/63  Pulse: 100 99 100 100  Resp:      Temp:      TempSrc:      SpO2: 91% 91% 90% (!) 89%  Weight:      Height:        Intake/Output Summary (Last 24 hours) at 11/25/2018 1647 Last data filed at 11/25/2018 1600 Gross per 24 hour  Intake 3642.8 ml  Output 3025 ml  Net 617.8 ml   Filed Weights   11/21/18 0500 11/23/18 0500 11/24/18 0500  Weight: 112.1 kg 111.5 kg 112.2 kg   General appearance: Remains intubated and sedated. Resp: Coarse breath sounds bilaterally.  Few crackles at the bases.  No wheezing or rhonchi. Cardio: S1-S2 irregularly irregular.  Tachycardic. GI: Abdomen is soft.  Nontender nondistended.  Bowel sounds are present normal.  No masses organomegaly Extremities: She has chronic lower extremity edema right greater than left.  Hyperpigmentation over the left leg. Neurologic: Sedated    Lab Results:  Data Reviewed: I have personally reviewed following labs and imaging studies  CBC: Recent Labs  Lab 11/19/18 0558  11/20/18 0651  11/21/18 0410  11/22/18 0550 11/22/18 1815 11/23/18 0500 11/23/18 1201 11/24/18 0505 11/25/18 0515  WBC 10.1  --  16.6*  --  16.6*  --  17.4*  --  17.8*  --  19.8* 19.6*   NEUTROABS 8.8*  --  13.9*  --  14.8*  --  15.7*  --   --   --   --   --   HGB 12.6   < > 12.8   < > 12.7   < > 12.9 13.6 12.6 13.3 12.3 11.1*  HCT 39.6   < > 42.1   < > 41.1   < > 42.8 40.0 42.5 39.0 41.7 37.3  MCV 86.1  --  87.9  --  89.3  --  88.4  --  89.7  --  89.9 91.0  PLT 136*  --  186  --  179  --  179  --  180  --  177 144*   < > = values in this interval not displayed.    Basic Metabolic Panel: Recent Labs  Lab 11/19/18 2020 11/20/18 0651  11/20/18 1611  11/21/18 0410  11/21/18 1630 11/22/18 0550 11/22/18 1815 11/23/18 0500 11/23/18 1201 11/24/18 0505 11/25/18 0515  NA 137 136   < >  --    < > 138   < >  --  142 139 143 143 145 146*  K 5.4* 5.4*   < >  --    < > 5.0   < >  --  4.8 4.9 5.1 4.8 5.2* 5.5*  CL 101 101  --   --   --  105  --   --  105  --  105  --  109 110  CO2 23 23  --   --   --  24  --   --  28  --  28  --  29 30  GLUCOSE 274* 247*  --   --   --  322*  --   --  287*  --  329*  --  325* 278*  BUN 34* 39*  --   --   --  49*  --   --  57*  --  80*  --  76* 97*  CREATININE 1.24* 1.35*  --   --   --  1.57*  --   --  1.51*  --  1.74*  --  1.35* 1.52*  CALCIUM 8.8* 8.6*  --   --   --  8.4*  --   --  8.5*  --  8.5*  --  8.2* 7.8*  MG 2.2 2.1  --  2.2  --  2.2  --  2.3  --   --   --   --   --   --   PHOS 5.5* 4.1  --  5.0*  --  3.9  --  4.2  --   --   --   --   --   --    < > = values in this interval not displayed.    GFR: Estimated Creatinine Clearance: 42.7 mL/min (A) (by C-G formula based on SCr of 1.52 mg/dL (H)).  Liver Function Tests: Recent Labs  Lab 11/19/18 0558 11/19/18 2020 11/20/18 0651 11/21/18 0410 11/22/18 0550  AST 49* 71* 52* 36 27  ALT 30 44 41 35 33  ALKPHOS 80 101 96 91 88  BILITOT 0.7 0.8 0.5 0.4 0.5  PROT 7.1 7.2 7.0 6.4* 6.4*  ALBUMIN 2.8* 2.7* 2.6* 2.5* 2.5*      CBG: Recent Labs  Lab 11/24/18 2317 11/25/18 0332 11/25/18 0801 11/25/18 1218 11/25/18 1534  GLUCAP 305* 257* 258* 237* 253*      Recent Results  (from the past 240 hour(s))  SARS Coronavirus 2 (CEPHEID- Performed in [facility] hospital lab), Hosp Order     Status: Abnormal   Collection Time: 11/21/2018  8:29 PM   Specimen: Nasopharyngeal Swab  Result Value Ref Range Status   SARS Coronavirus 2 POSITIVE (A) NEGATIVE Final  Comment: CRITICAL RESULT CALLED TO, READ BACK BY AND VERIFIED WITH: RN P HOWARD AT 0106 11/19/18 CRUICKSHANK A (NOTE) If result is NEGATIVE SARS-CoV-2 target nucleic acids are NOT DETECTED. The SARS-CoV-2 RNA is generally detectable in upper and lower  respiratory specimens during the acute phase of infection. The lowest  concentration of SARS-CoV-2 viral copies this assay can detect is 250  copies / mL. A negative result does not preclude SARS-CoV-2 infection  and should not be used as the sole basis for treatment or other  patient management decisions.  A negative result may occur with  improper specimen collection / handling, submission of specimen other  than nasopharyngeal swab, presence of viral mutation(s) within the  areas targeted by this assay, and inadequate number of viral copies  (<250 copies / mL). A negative result must be combined with clinical  observations, patient history, and epidemiological information. If result is POSITIVE SARS-CoV-2 target nucleic acids a re DETECTED. The SARS-CoV-2 RNA is generally detectable in upper and lower  respiratory specimens during the acute phase of infection.  Positive  results are indicative of active infection with SARS-CoV-2.  Clinical  correlation with patient history and other diagnostic information is  necessary to determine patient infection status.  Positive results do  not rule out bacterial infection or co-infection with other viruses. If result is PRESUMPTIVE POSTIVE SARS-CoV-2 nucleic acids MAY BE PRESENT.   A presumptive positive result was obtained on the submitted specimen  and confirmed on repeat testing.  While 2019 novel coronavirus   (SARS-CoV-2) nucleic acids may be present in the submitted sample  additional confirmatory testing may be necessary for epidemiological  and / or clinical management purposes  to differentiate between  SARS-CoV-2 and other Sarbecovirus currently known to infect humans.  If clinically indicated additional testing with an alternate test  methodolo gy [phone]) is advised. The SARS-CoV-2 RNA is generally  detectable in upper and lower respiratory specimens during the acute  phase of infection. The expected result is Negative. Fact Sheet for Patients:  StrictlyIdeas.no Fact Sheet for Healthcare Providers: BankingDealers.co.za This test is not yet approved or cleared by the [place] FDA and has been authorized for detection and/or diagnosis of SARS-CoV-2 by FDA under an Emergency Use Authorization (EUA).  This EUA will remain in effect (meaning this test can be used) for the duration of the COVID-19 declaration under Section 564(b)(1) of the Act, 21 U.S.C. section 360bbb-3(b)(1), unless the authorization is terminated or revoked sooner. Performed at [facility], [place] [address] [place], [place] [address]   MRSA PCR Screening     Status: None   Collection Time: 10/30/2018 11:15 PM   Specimen: Nasal Mucosa; Nasopharyngeal  Result Value Ref Range Status   MRSA by PCR NEGATIVE NEGATIVE Final    Comment:        The GeneXpert MRSA Assay (FDA approved for NASAL specimens only), is one component of a comprehensive MRSA colonization surveillance program. It is not intended to diagnose MRSA infection nor to guide or monitor treatment for MRSA infections. Performed at [facility], [place] [address] [place], [place] [address]       Radiology Studies: Dg Chest Port 1 View  Result Date: 11/25/2018 CLINICAL DATA:  COVID-19 pneumonia EXAM: PORTABLE CHEST 1 VIEW COMPARISON:  Multiple prior chest films.  FINDINGS: The endotracheal tube is 2.7 cm above the carina. The NG tube is coursing down the esophagus and into the stomach. The left IJ central venous catheter  is stable with its tip near the brachiocephalic SVC junction. The heart is mildly enlarged but stable. Stable tortuosity and calcification of the thoracic aorta. Persistent bilateral infiltrates, left greater than right. No definite pleural effusions. Stable fairly marked elevation of the right hemidiaphragm. IMPRESSION: 1. Stable support apparatus as detailed above. 2. Stable cardiac enlargement. 3. Persistent bilateral infiltrates. Electronically Signed   By: Marijo Sanes M.D.   On: 11/25/2018 09:04   Dg Chest Port 1 View  Result Date: 11/24/2018 CLINICAL DATA:  Respiratory failure EXAM: PORTABLE CHEST 1 VIEW COMPARISON:  11/23/2018, 11/22/2018, 11/21/2018, 11/19/2018 FINDINGS: Endotracheal tube tip is about 2 cm superior to the carina. Esophageal tube tip is below the diaphragm but non included. Left IJ central venous catheter tip projects over the upper mediastinum. Elevation of right diaphragm. Overall no significant change in left greater than right pulmonary infiltrates. Aortic atherosclerosis. No pneumothorax. Moderate subcutaneous emphysema over the supraclavicular fossa and the chest wall on the right. IMPRESSION: 1. Support lines and tubes as above 2. Overall no significant change in left greater than right pulmonary infiltrate since the prior exam 3. Continued moderate subcutaneous emphysema over the chest walls and supraclavicular fossa Electronically Signed   By: Donavan Foil M.D.   On: 11/24/2018 02:48   Dg Chest Port 1 View  Result Date: 11/23/2018 CLINICAL DATA:  Change in heart rate and blood pressure. EXAM: PORTABLE CHEST 1 VIEW COMPARISON:  Chest x-rays dated 11/23/2018 and 11/22/2018. FINDINGS: Support apparatus appears stable in position. Persistent perihilar and bibasilar opacities, likely a combination of pulmonary edema and  bibasilar atelectasis. No pleural effusion or pneumothorax seen. Subcutaneous emphysema again noted at the level of the upper chest and lower neck. IMPRESSION: Stable chest x-ray. Persistent perihilar and bibasilar opacities, likely a combination of pulmonary edema and bibasilar atelectasis. Persistent subcutaneous emphysema within the upper chest and lower neck. No underlying pneumothorax seen. Electronically Signed   By: Franki Cabot M.D.   On: 11/23/2018 19:14       LOS: 7 days   [facility] Pager on [email or web address]  11/25/2018, 4:47 PM

## 2018-11-25 NOTE — Progress Notes (Signed)
NAME:  [name] [name], MRN:  [id number], DOB:  Nov 17, 1950, LOS: 7 ADMISSION DATE:  11/23/2018, CONSULTATION DATE: July 23 REFERRING MD: Dr. Candiss Norse, CHIEF COMPLAINT: Dyspnea  Brief History   68 year old female admitted for severe acute respiratory failure with hypoxemia due to COVID-19 pneumonia.  Past Medical History  Diabetes mellitus type 2 Obesity Asthma, has been on inhalers in the past, never smoked cigarettes CKD stage III Chronic A. fib History of spine surgery History of Graves' disease, now hypothyroid History of stroke History of DVT Lives in an assisted living facility [facility] Events   July 22 admitted July 23 intubated July 24 prone positioning July 26 subcutaneous air, no pneumothorax, afib with RVR again July 29 decreasing sedation, diuresis  Consults:  PCCM  Procedures:  7/23 ETT > 7/23 L IJ CVL >  Significant Diagnostic Tests:  7/24 Echo > LVEF > 65%, impaired relaxation, RV normal systolic function, mild thickening of aortic valve  Micro Data:  July 22 SARS COV 2 Positive  Antimicrobials:  July 22 remdesivir> 7/26 July 22 decadron July 22> 23 Toclizumab   Interim history/subjective:   Patient remains intubated on mechanical life support.  In the intensive care unit.  Episodes of atrial fibrillation with RVR overnight and this morning.  Would like to see if she can have a true SBT tomorrow morning  Objective   Blood pressure (!) 102/55, pulse 96, temperature 98.5 F (36.9 C), temperature source Axillary, resp. rate (!) 31, height 5\' 3"  (1.6 m), weight 112.2 kg, SpO2 96 %. CVP:  [0 mmHg-14 mmHg] 11 mmHg  Vent Mode: PRVC FiO2 (%):  [65 %-70 %] 65 % Set Rate:  [30 bmp] 30 bmp Vt Set:  [360 mL] 360 mL PEEP:  [8 cmH20] 8 cmH20 Plateau Pressure:  [14 cmH20-22 cmH20] 14 cmH20   Intake/Output Summary (Last 24 hours) at 11/25/2018 1607 Last data filed at 11/25/2018 0600 Gross per 24 hour  Intake 3154.78 ml  Output 2325 ml  Net  829.78 ml   Filed Weights   11/21/18 0500 11/23/18 0500 11/24/18 0500  Weight: 112.1 kg 111.5 kg 112.2 kg    Examination:  General: Intubated on mechanical life support, sedated, critically ill HENT: NCAT, endotracheal tube in place PULM: Bilateral ventilated breath sounds CV: Tachycardic irregular, no MRG GI: Soft nontender nondistended BS present MSK: Dependent edema upper and lower extremities Neuro: Sedated, unresponsive to pain Derm: Palpable crepitus anterior neck  Chest x-ray 11/25/2018: stable support lines and tubes.  Bilateral infiltrates The patient's images have been independently reviewed by me.     Resolved Hospital Problem list     Assessment & Plan:   Severe acute respiratory failure with hypoxemia secondary to ARDS from COVID pneumonia requiring intubation and mechanical ventilation. Continue mechanical ventilation per ARDS protocol Target TVol 6-8cc/kgIBW Target Plateau Pressure < 30cm H20 Target driving pressure less than 15 cm of water Target PaO2 55-65: titrate PEEP/FiO2 per protocol As long as PaO2 to FiO2 ratio is less than 1:150 position in prone position for 16 hours a day Check CVP daily if CVL in place Target CVP less than 4, diurese as necessary Ventilator associated pneumonia prevention protocol 11/25/2018: Continue Decadron She has completed remdesivir and tocolizumab dosing  Additional diuresis today, positive cumulative fluid balance 40 Lasix every 6 hours x2 doses, metolazone 2.5 mg x 1 Would like to see if she can have a true SBT tomorrow morning  Pneumomediastinum: we have seen this several times in patients with  advanced ARDS, no evidence of growth or hemodynamic compromise No significant change of pneumomediastinum Continue to observe clinically. Greatest benefit is to wean from mechanical support and off of positive pressure. Sedation and mental status precludes additional weaning. She did tolerate a short trial of SBT this morning  however converted to A. fib RVR  VT 7/23 PM: has not recurred, unclear cause; electrolytes OK, Echo OK Atrial fibrillation with RVR, started on oral amiodarone, transition from IV Remains on telemetry Went into atrial fibrillation with RVR during SBT. Patient was given PRN dose of metoprolol IV 5 mg x 2 Patient scheduled on oral metoprolol 25 mg p.o. every 8 hours  Need for sedation for ventilator dyssynchrony: I have discontinued the patient's PRN neuromuscular blockade. Would recommend decreasing her sedation. We need to see how she performs awake and tolerating SBT if possible. Goal RA SS -2 to -1  History of DVT Heparin infusion changed to Xarelto for this evening  Hyperkalemia Lokelma, per [facility]   Best practice:  Diet: start tube feeding Pain/Anxiety/Delirium protocol (if indicated): Sedation with fentanyl and Versed VAP protocol (if indicated): yes DVT prophylaxis: heparin infusion GI prophylaxis: protonix Glucose control: per [facility] Mobility: bed rest Code Status: full Family Communication: Per [facility] Disposition: remain in ICU  Labs   CBC: Recent Labs  Lab 11/19/18 0558  11/20/18 0651  11/21/18 0410  11/22/18 0550 11/22/18 1815 11/23/18 0500 11/23/18 1201 11/24/18 0505 11/25/18 0515  WBC 10.1  --  16.6*  --  16.6*  --  17.4*  --  17.8*  --  19.8* 19.6*  NEUTROABS 8.8*  --  13.9*  --  14.8*  --  15.7*  --   --   --   --   --   HGB 12.6   < > 12.8   < > 12.7   < > 12.9 13.6 12.6 13.3 12.3 11.1*  HCT 39.6   < > 42.1   < > 41.1   < > 42.8 40.0 42.5 39.0 41.7 37.3  MCV 86.1  --  87.9  --  89.3  --  88.4  --  89.7  --  89.9 91.0  PLT 136*  --  186  --  179  --  179  --  180  --  177 144*   < > = values in this interval not displayed.    Basic Metabolic Panel: Recent Labs  Lab 11/19/18 2020 11/20/18 0651  11/20/18 1611  11/21/18 0410  11/21/18 1630 11/22/18 0550 11/22/18 1815 11/23/18 0500 11/23/18 1201 11/24/18 0505  NA 137 136   < >  --    < > 138   < >   --  142 139 143 143 145  K 5.4* 5.4*   < >  --    < > 5.0   < >  --  4.8 4.9 5.1 4.8 5.2*  CL 101 101  --   --   --  105  --   --  105  --  105  --  109  CO2 23 23  --   --   --  24  --   --  28  --  28  --  29  GLUCOSE 274* 247*  --   --   --  322*  --   --  287*  --  329*  --  325*  BUN 34* 39*  --   --   --  49*  --   --  57*  --  80*  --  76*  CREATININE 1.24* 1.35*  --   --   --  1.57*  --   --  1.51*  --  1.74*  --  1.35*  CALCIUM 8.8* 8.6*  --   --   --  8.4*  --   --  8.5*  --  8.5*  --  8.2*  MG 2.2 2.1  --  2.2  --  2.2  --  2.3  --   --   --   --   --   PHOS 5.5* 4.1  --  5.0*  --  3.9  --  4.2  --   --   --   --   --    < > = values in this interval not displayed.   GFR: Estimated Creatinine Clearance: 48 mL/min (A) (by C-G formula based on SCr of 1.35 mg/dL (H)). Recent Labs  Lab 11/22/18 0550 11/23/18 0500 11/24/18 0505 11/25/18 0515  WBC 17.4* 17.8* 19.8* 19.6*    Liver Function Tests: Recent Labs  Lab 11/19/18 0558 11/19/18 2020 11/20/18 0651 11/21/18 0410 11/22/18 0550  AST 49* 71* 52* 36 27  ALT 30 44 41 35 33  ALKPHOS 80 101 96 91 88  BILITOT 0.7 0.8 0.5 0.4 0.5  PROT 7.1 7.2 7.0 6.4* 6.4*  ALBUMIN 2.8* 2.7* 2.6* 2.5* 2.5*   No results for input(s): LIPASE, AMYLASE in the last 168 hours. No results for input(s): AMMONIA in the last 168 hours.  ABG    Component Value Date/Time   PHART 7.324 (L) 11/23/2018 1201   PCO2ART 59.2 (H) 11/23/2018 1201   PO2ART 62.0 (L) 11/23/2018 1201   HCO3 30.9 (H) 11/23/2018 1201   TCO2 33 (H) 11/23/2018 1201   ACIDBASEDEF 3.0 (H) 11/21/2018 0628   O2SAT 89.0 11/23/2018 1201     Coagulation Profile: No results for input(s): INR, PROTIME in the last 168 hours.  Cardiac Enzymes: No results for input(s): CKTOTAL, CKMB, CKMBINDEX, TROPONINI in the last 168 hours.  HbA1C: Hgb A1c MFr Bld  Date/Time Value Ref Range Status  10/30/2018 08:00 PM 7.0 (H) 4.8 - 5.6 % Final    Comment:    (NOTE) Pre diabetes:           5.7%-6.4% Diabetes:              >6.4% Glycemic control for   <7.0% adults with diabetes   10/18/2016 10:47 AM 6.0 (H) 4.8 - 5.6 % Final    Comment:    (NOTE)         Pre-diabetes: 5.7 - 6.4         Diabetes: >6.4         Glycemic control for adults with diabetes: <7.0     CBG: Recent Labs  Lab 11/24/18 1559 11/24/18 2055 11/24/18 2317 11/25/18 0332 11/25/18 0801  GLUCAP 351* 316* 305* 257* 258*      This patient is critically ill with multiple organ system failure; which, requires frequent high complexity decision making, assessment, support, evaluation, and titration of therapies. This was completed through the application of advanced monitoring technologies and extensive interpretation of multiple databases. During this encounter critical care time was devoted to patient care services described in this note for 37 minutes.  Garner Nash, DO [facility] 11/25/2018 8:38 AM  Personal pager: [phone] If unanswered, please page CCM On-call: [phone]

## 2018-11-25 NOTE — Progress Notes (Addendum)
Inpatient Diabetes Program Recommendations  AACE/ADA: New Consensus Statement on Inpatient Glycemic Control (2015)  Target Ranges:  Prepandial:   less than 140 mg/dL      Peak postprandial:   less than 180 mg/dL (1-2 hours)      Critically ill patients:  140 - 180 mg/dL   Lab Results  Component Value Date   GLUCAP 258 (H) 11/25/2018   HGBA1C 7.0 (H) 11/20/2018    Review of Glycemic Control  Results for YESIKA, RISPOLI (MRN [id number]) as of 11/25/2018 10:11  Ref. Range 11/24/2018 15:59 11/24/2018 20:55 11/24/2018 23:17 11/25/2018 03:32 11/25/2018 08:01  Glucose-Capillary Latest Ref Range: 70 - 99 mg/dL 351 (H) 316 (H) 305 (H) 257 (H) 258 (H)   Diabetes history: DM 2 Outpatient Diabetes medications: None Current orders for Inpatient glycemic control:  Novolog resistant q 4 hours, Levemir 45 units bid, Novolog 4 units q 4 hours  Inpatient Diabetes Program Recommendations:    Please increase Novolog tube feed coverage to 8 units q 4 hours.   Thanks  Adah Perl, RN, BC-ADM Inpatient Diabetes Coordinator Pager [phone] (8a-5p)

## 2018-11-25 NOTE — Progress Notes (Signed)
ANTICOAGULATION CONSULT NOTE - Follow Up Consult  Pharmacy Consult for Xarelto Indication: Afib, Hx DVT on PTA Xarelto  Allergies  Allergen Reactions  . Sulfonamide Derivatives Itching    Patient Measurements: Height: 5\' 3"  (160 cm) Weight: 247 lb 5.7 oz (112.2 kg) IBW/kg (Calculated) : 52.4 Heparin Dosing Weight: 79 kg  Vital Signs: Temp: 99.4 F (37.4 C) (07/29 1215) Temp Source: Axillary (07/29 1215) BP: 117/71 (07/29 1207) Pulse Rate: 153 (07/29 1207)  Labs: Recent Labs    11/23/18 0500 11/23/18 1201 11/23/18 1300  11/24/18 0505 11/24/18 1613 11/24/18 1815 11/25/18 0515  HGB 12.6 13.3  --   --  12.3  --   --  11.1*  HCT 42.5 39.0  --   --  41.7  --   --  37.3  PLT 180  --   --   --  177  --   --  144*  APTT  --   --  25   < > 155* 195* 184* 133*  HEPARINUNFRC  --   --  >2.20*  --  2.00*  --   --  1.64*  CREATININE 1.74*  --   --   --  1.35*  --   --  1.52*   < > = values in this interval not displayed.    Estimated Creatinine Clearance: 42.7 mL/min (A) (by C-G formula based on SCr of 1.52 mg/dL (H)).   Medications: Infusions:  . sodium chloride Stopped (11/23/18 0159)  . fentaNYL infusion INTRAVENOUS 200 mcg/hr (11/25/18 1000)  . midazolam 3 mg/hr (11/25/18 1155)  . phenylephrine (NEO-SYNEPHRINE) Adult infusion 9 mcg/min (11/25/18 1205)    Assessment: 40 yoF admitted on 7/22 with PMH of afib and DVT on chronic Xarelto.  She has developed subcutaneous emphysema and may need chest tube in the future (not yet per MD) and Pharmacy is consulted to change from Xarelto to Heparin infusion.  Now transitioning back to Xarelto with no further plans for invasive procedures.  Today, 11/25/2018:  CBC: Hgb decreased to 11.1, Plt decreased to 144.    RN reports minor oral bleeding again today (first noticed overnight 7/27) which appears to be dried, but does get minor ozzing/bleeding when giving mouthcare.  Source might be a tongue laceration.  MD aware and OK to  continue full anticoagulation.  SCr increased to 1.52, CrCl ~ 43 ml/min, but using actual body weight, CrCl ~ 62 ml/min (remains above 50 ml/min dose adjustment threshold)  Goal of Therapy:  Monitor platelets by anticoagulation protocol: Yes   Plan:   Xarelto 20mg  once daily  Pharmacy will sign off notes, but will f/u peripherally.  Gretta Arab PharmD, BCPS Clinical pharmacist phone 7am- 5pm: [phone] 11/25/2018 12:19 PM

## 2018-11-25 NOTE — Progress Notes (Signed)
Spoke with and updated pt's daughter. She was very thankful for the care her mother was getting from everyone.

## 2018-11-25 NOTE — Progress Notes (Signed)
ANTICOAGULATION CONSULT NOTE - Follow Up Consult  Pharmacy Consult for Heparin Indication: Afib, Hx DVT on PTA Xarelto  Allergies  Allergen Reactions  . Sulfonamide Derivatives Itching    Patient Measurements: Height: 5\' 3"  (160 cm) Weight: 247 lb 5.7 oz (112.2 kg) IBW/kg (Calculated) : 52.4 Heparin Dosing Weight: 79 kg  Vital Signs: Temp: 98.5 F (36.9 C) (07/29 0800) Temp Source: Axillary (07/29 0800) BP: 102/55 (07/29 0800) Pulse Rate: 96 (07/29 0800)  Labs: Recent Labs    11/23/18 0500 11/23/18 1201 11/23/18 1300  11/24/18 0505 11/24/18 1613 11/24/18 1815 11/25/18 0515  HGB 12.6 13.3  --   --  12.3  --   --  11.1*  HCT 42.5 39.0  --   --  41.7  --   --  37.3  PLT 180  --   --   --  177  --   --  144*  APTT  --   --  25   < > 155* 195* 184*  --   HEPARINUNFRC  --   --  >2.20*  --  2.00*  --   --   --   CREATININE 1.74*  --   --   --  1.35*  --   --   --    < > = values in this interval not displayed.    Estimated Creatinine Clearance: 48 mL/min (A) (by C-G formula based on SCr of 1.35 mg/dL (H)).   Medications: Infusions:  . sodium chloride Stopped (11/23/18 0159)  . fentaNYL infusion INTRAVENOUS 250 mcg/hr (11/25/18 0600)  . heparin 750 Units/hr (11/25/18 0600)  . midazolam 8 mg/hr (11/25/18 0600)  . midazolam 8 mg/hr (11/24/18 2134)  . phenylephrine (NEO-SYNEPHRINE) Adult infusion 15 mcg/min (11/25/18 0600)    Assessment: 89 yoF admitted on 7/22 with PMH of afib and DVT on chronic Xarelto.  She has developed subcutaneous emphysema and may need chest tube in the future (not yet per MD) and Pharmacy is consulted to change from Xarelto to Heparin infusion.  Last Xarelto dose given on 7/26 at 1700.  Today, 11/25/2018:  APTT 133, remains supratherapeutic despite decreased heparin rate.  Heparin level 1.64 is supratherapeutic, but falsely elevated d/t recent Xarelto.  Continue to use APTT to titrate Heparin dosing.  Also, Heparin level was drawn at 5:15, but  not run until 8:15 which is outside the recommended 2 hour window and may affect accuracy of results.  CBC: Hgb decreased to 11.1, Plt decreased to 144.    RN reports minor oral bleeding again today (first noticed overnight 7/27) which appears to be dried, but does get minor ozzing/bleeding when giving mouthcare, but without visible source.  MD aware.  SCr increased to 1.52, CrCl ~ 43 ml/min.   Goal of Therapy:  Heparin level 0.3-0.7 units/ml aPTT 66-102 seconds Monitor platelets by anticoagulation protocol: Yes   Plan:   HOLD heparin x1 hour  Decrease to heparin IV infusion 450 units/hr  APTT 6 hours after rate change.  Daily heparin level and CBC, APTT.  Follow up plans for chest tube or invasive procedures.   Gretta Arab PharmD, BCPS Clinical pharmacist phone 7am- 5pm: [phone] 11/25/2018 8:28 AM

## 2018-11-25 NOTE — Procedures (Signed)
Cortrak  Person Inserting Tube:  [name] [name], RD Tube Type:  Cortrak - 43 inches Tube Location:  Left nare Initial Placement:  Postpyloric Secured by: Bridle Technique Used to Measure Tube Placement:  Documented cm marking at nare/ corner of mouth Cortrak Secured At:  87 cm    Cortrak Tube Team Note:  Consult received to place a Cortrak feeding tube.   No x-ray is required. RN may begin using tube.   If the tube becomes dislodged please keep the tube and contact the Cortrak team at [email or web address] ([email or web address]) for replacement.  If after hours and replacement cannot be delayed, place a NG tube and confirm placement with an abdominal x-ray.    [name] [name], [place], [place] Pager [phone] After Hours Pager

## 2018-11-26 ENCOUNTER — Other Ambulatory Visit: Payer: Self-pay

## 2018-11-26 ENCOUNTER — Inpatient Hospital Stay (HOSPITAL_COMMUNITY): Payer: Medicare Other

## 2018-11-26 LAB — GLUCOSE, CAPILLARY
Glucose-Capillary: 294 mg/dL — ABNORMAL HIGH (ref 70–99)
Glucose-Capillary: 274 mg/dL — ABNORMAL HIGH (ref 70–99)
Glucose-Capillary: 218 mg/dL — ABNORMAL HIGH (ref 70–99)
Glucose-Capillary: 222 mg/dL — ABNORMAL HIGH (ref 70–99)
Glucose-Capillary: 200 mg/dL — ABNORMAL HIGH (ref 70–99)
Glucose-Capillary: 204 mg/dL — ABNORMAL HIGH (ref 70–99)

## 2018-11-26 LAB — CBC
HCT: 39.6 % (ref 36.0–46.0)
HCT: 37.9 % (ref 36.0–46.0)
Hemoglobin: 11.5 g/dL — ABNORMAL LOW (ref 12.0–15.0)
Hemoglobin: 11.4 g/dL — ABNORMAL LOW (ref 12.0–15.0)
MCH: 26.8 pg (ref 26.0–34.0)
MCH: 27.4 pg (ref 26.0–34.0)
MCHC: 29 g/dL — ABNORMAL LOW (ref 30.0–36.0)
MCHC: 30.1 g/dL (ref 30.0–36.0)
MCV: 92.3 fL (ref 80.0–100.0)
MCV: 91.1 fL (ref 80.0–100.0)
Platelets: 139 10*3/uL — ABNORMAL LOW (ref 150–400)
Platelets: 149 10*3/uL — ABNORMAL LOW (ref 150–400)
RBC: 4.29 MIL/uL (ref 3.87–5.11)
RBC: 4.16 MIL/uL (ref 3.87–5.11)
RDW: 13.7 % (ref 11.5–15.5)
RDW: 13.8 % (ref 11.5–15.5)
WBC: 20.4 10*3/uL — ABNORMAL HIGH (ref 4.0–10.5)
WBC: 28.3 10*3/uL — ABNORMAL HIGH (ref 4.0–10.5)
nRBC: 2 % — ABNORMAL HIGH (ref 0.0–0.2)
nRBC: 2.6 % — ABNORMAL HIGH (ref 0.0–0.2)

## 2018-11-26 LAB — BASIC METABOLIC PANEL
Anion gap: 8 (ref 5–15)
Anion gap: 11 (ref 5–15)
BUN: 130 mg/dL — ABNORMAL HIGH (ref 8–23)
BUN: 150 mg/dL — ABNORMAL HIGH (ref 8–23)
CO2: 33 mmol/L — ABNORMAL HIGH (ref 22–32)
CO2: 30 mmol/L (ref 22–32)
Calcium: 7.8 mg/dL — ABNORMAL LOW (ref 8.9–10.3)
Calcium: 7.9 mg/dL — ABNORMAL LOW (ref 8.9–10.3)
Chloride: 108 mmol/L (ref 98–111)
Chloride: 108 mmol/L (ref 98–111)
Creatinine, Ser: 1.79 mg/dL — ABNORMAL HIGH (ref 0.44–1.00)
Creatinine, Ser: 2.1 mg/dL — ABNORMAL HIGH (ref 0.44–1.00)
GFR calc Af Amer: 33 mL/min — ABNORMAL LOW (ref >60)
GFR calc Af Amer: 27 mL/min — ABNORMAL LOW (ref >60)
GFR calc non Af Amer: 29 mL/min — ABNORMAL LOW (ref >60)
GFR calc non Af Amer: 24 mL/min — ABNORMAL LOW (ref >60)
Glucose, Bld: 299 mg/dL — ABNORMAL HIGH (ref 70–99)
Glucose, Bld: 227 mg/dL — ABNORMAL HIGH (ref 70–99)
Potassium: 5.4 mmol/L — ABNORMAL HIGH (ref 3.5–5.1)
Potassium: 5.8 mmol/L — ABNORMAL HIGH (ref 3.5–5.1)
Sodium: 149 mmol/L — ABNORMAL HIGH (ref 135–145)
Sodium: 149 mmol/L — ABNORMAL HIGH (ref 135–145)

## 2018-11-26 LAB — POCT I-STAT 7, (LYTES, BLD GAS, ICA,H+H)
Acid-Base Excess: 4 mmol/L — ABNORMAL HIGH (ref 0.0–2.0)
Bicarbonate: 31.8 mmol/L — ABNORMAL HIGH (ref 20.0–28.0)
Calcium, Ion: 1.18 mmol/L (ref 1.15–1.40)
HCT: 36 % (ref 36.0–46.0)
Hemoglobin: 12.2 g/dL (ref 12.0–15.0)
O2 Saturation: 88 %
Patient temperature: 100.5
Potassium: 5.5 mmol/L — ABNORMAL HIGH (ref 3.5–5.1)
Sodium: 147 mmol/L — ABNORMAL HIGH (ref 135–145)
TCO2: 34 mmol/L — ABNORMAL HIGH (ref 22–32)
pCO2 arterial: 64.4 mmHg — ABNORMAL HIGH (ref 32.0–48.0)
pH, Arterial: 7.307 — ABNORMAL LOW (ref 7.350–7.450)
pO2, Arterial: 65 mmHg — ABNORMAL LOW (ref 83.0–108.0)

## 2018-11-26 LAB — D-DIMER, QUANTITATIVE: D-Dimer, Quant: 9.3 ug/mL-FEU — ABNORMAL HIGH (ref 0.00–0.50)

## 2018-11-26 LAB — MAGNESIUM: Magnesium: 3 mg/dL — ABNORMAL HIGH (ref 1.7–2.4)

## 2018-11-26 MED ORDER — SODIUM ZIRCONIUM CYCLOSILICATE 10 G PO PACK
10.0000 g | PACK | Freq: Every day | ORAL | Status: DC
  Administered 2018-11-26 – 2018-11-29 (×4): 10 g via ORAL
  Filled 2018-11-26 (×5): qty 1

## 2018-11-26 MED ORDER — METOLAZONE 2.5 MG PO TABS
2.5000 mg | ORAL_TABLET | Freq: Once | ORAL | Status: AC
  Administered 2018-11-26: 20:00:00 2.5 mg via ORAL
  Filled 2018-11-26: qty 1

## 2018-11-26 MED ORDER — PHENYLEPHRINE HCL-NACL 10-0.9 MG/250ML-% IV SOLN
INTRAVENOUS | Status: AC
  Administered 2018-11-26: 20 ug/min via INTRAVENOUS
  Filled 2018-11-26: qty 250

## 2018-11-26 MED ORDER — FUROSEMIDE 10 MG/ML IJ SOLN
40.0000 mg | Freq: Four times a day (QID) | INTRAMUSCULAR | Status: AC
  Administered 2018-11-26 (×2): 40 mg via INTRAVENOUS
  Filled 2018-11-26: qty 4

## 2018-11-26 MED ORDER — SENNOSIDES 8.8 MG/5ML PO SYRP: 10.0000 mL | ORAL_SOLUTION | Freq: Every evening | ORAL | Status: DC | PRN

## 2018-11-26 MED ORDER — PANTOPRAZOLE SODIUM 40 MG PO PACK
40.0000 mg | PACK | Freq: Two times a day (BID) | ORAL | Status: DC
  Administered 2018-11-26 – 2018-12-09 (×27): 40 mg
  Filled 2018-11-26 (×27): qty 20

## 2018-11-26 MED ORDER — METOPROLOL TARTRATE 25 MG/10 ML ORAL SUSPENSION: 25.0000 mg | Freq: Two times a day (BID) | ORAL | Status: DC

## 2018-11-26 MED ORDER — INSULIN ASPART 100 UNIT/ML ~~LOC~~ SOLN
8.0000 [IU] | SUBCUTANEOUS | Status: DC
  Administered 2018-11-26 – 2018-11-29 (×19): 8 [IU] via SUBCUTANEOUS

## 2018-11-26 MED ORDER — PHENYLEPHRINE HCL-NACL 40-0.9 MG/250ML-% IV SOLN
0.0000 ug/min | INTRAVENOUS | Status: DC
  Administered 2018-11-26: 21:00:00 95 ug/min via INTRAVENOUS
  Administered 2018-11-27 (×2): 85 ug/min via INTRAVENOUS
  Administered 2018-11-27: 125 ug/min via INTRAVENOUS
  Administered 2018-11-28: 90 ug/min via INTRAVENOUS
  Filled 2018-11-26 (×7): qty 250

## 2018-11-26 MED ORDER — PHENYLEPHRINE HCL-NACL 10-0.9 MG/250ML-% IV SOLN
0.0000 ug/min | INTRAVENOUS | Status: DC
  Administered 2018-11-26: 20:00:00 105 ug/min via INTRAVENOUS
  Administered 2018-11-26: 18:00:00 80 ug/min via INTRAVENOUS
  Filled 2018-11-26: qty 250

## 2018-11-26 MED ORDER — METOPROLOL TARTRATE 25 MG/10 ML ORAL SUSPENSION: 25.0000 mg | Freq: Three times a day (TID) | ORAL | Status: DC

## 2018-11-26 MED ORDER — POLYETHYLENE GLYCOL 3350 17 G PO PACK
17.0000 g | PACK | Freq: Every day | ORAL | Status: DC
  Administered 2018-11-29 – 2018-12-08 (×7): 17 g
  Filled 2018-11-26 (×9): qty 1

## 2018-11-26 MED ORDER — SORBITOL 70 % SOLN
960.0000 mL | TOPICAL_OIL | Freq: Once | ORAL | Status: DC
  Filled 2018-11-26: qty 473

## 2018-11-26 MED ORDER — FREE WATER
200.0000 mL | Freq: Three times a day (TID) | Status: DC
  Administered 2018-11-26 – 2018-11-27 (×3): 200 mL

## 2018-11-26 MED ORDER — PHENYLEPHRINE HCL-NACL 10-0.9 MG/250ML-% IV SOLN
INTRAVENOUS | Status: AC
  Filled 2018-11-26: qty 250

## 2018-11-26 NOTE — Progress Notes (Signed)
Daughter [name] called for updates, RN unavailable. Will have RN call daughter back when she's available to do so.

## 2018-11-26 NOTE — Progress Notes (Deleted)
PROGRESS NOTE  [name] [name] VZD:[id number] DOB: July 19, 1950 DOA: 11/22/2018  PCP: Nicholos Johns, MD  Brief History/Interval Summary: 68 y.o. female, with history of morbid obesity, DM type II, COPD not on oxygen, hypertension, CKD 3 baseline creatinine around 1.4, chronic atrial fibrillation [place] vas 2 score of at least 3 on Xarelto and Cardizem, spine surgeries in the past, chronic fluid retention, history of Graves' disease now hypothyroid, history of CVA, history of DVT who lives at [facility] and was likely exposed to COVID-19 infection about 10 to 14 days prior to this admission.    She was experiencing fever, body aches and developed shortness of breath.  She finally decided to come into the emergency department at [facility].  She was found to be positive for COVID-19.  Was noted to have hypoxia.  She was hospitalized for further management.  Reason for Visit: Acute respiratory failure with hypoxia due to COVID-19  Consultants: Pulmonology  Procedures:   Transthoracic echocardiogram 7/24 1. The left ventricle has hyperdynamic systolic function, with an ejection fraction of >65%. The cavity size was normal. Left ventricular diastolic Doppler parameters are consistent with impaired relaxation.  2. The right ventricle has normal systolc function. The cavity was normal. There is no increase in right ventricular wall thickness. Right ventricular systolic pressure is normal.  3. Mild thickening of the aortic valve. No stenosis of the aortic valve.  4. The aortic root and ascending aorta are normal in size and structure.    Antibiotics: Anti-infectives (From admission, onward)   Start     Dose/Rate Route Frequency Ordered Stop   11/19/18 2030  remdesivir 100 mg in sodium chloride 0.9 % 250 mL IVPB     100 mg 500 mL/hr over 30 Minutes Intravenous Every 24 hours 11/13/2018 1855 11/22/18 2204   10/29/2018 2030  remdesivir 200 mg in sodium chloride 0.9 % 250 mL IVPB     200 mg  500 mL/hr over 30 Minutes Intravenous Once 11/05/2018 1855 11/06/2018 2056       Subjective/Interval History: On ventilator.  Had significant dyssynchrony this morning.  Becomes hypotensive later in the afternoon requiring pressors.  RN reported overnight black BM loose and multiple.  Assessment/Plan:  Acute Hypoxic Resp. Failure due to Acute Covid 19 Viral Illness  COVID-19 Labs  Recent Labs    11/24/18 0505 11/25/18 0515 11/26/18 0548  DDIMER 13.58* 9.59* 9.30*  CRP  --  2.3*  --     Lab Results  Component Value Date   SARSCOV2NAA POSITIVE (A) 11/22/2018     Fever: Remains afebrile Oxygen requirements: Mechanical ventilation.  65% FiO2.  Saturating in the 90s.    Antibacterials: None Remdesivir: Completed course Steroids: Dexamethasone 6 mg once daily.  Being tapered.  Diuretics: Received Lasix yesterday.  Consider additional dose today. Actemra: Has received 2 doses, 7/22 and 7/23 Vitamin C and Zinc: Continue DVT Prophylaxis: Patient transitioned from rivaroxaban to IV heparin.  Will be transition back to rivaroxaban today  Patient remains intubated and sedated.  Pulmonology is following.  She was given Lasix yesterday.  She was given Lasix and metolazone today.    From a COVID-19 standpoint patient has completed course of Remdesivir.  She remains on steroids which is being tapered.  Her inflammatory markers have improved.  CRP is down to 2.3.  D-dimer 9.59.  WBC is stable.   Subcutaneous emphysema/pneumomediastinum Overnight on 7/26 she was also found to have subcutaneous emphysema.  Chest x-ray also which suggests mediastinal emphysema.  Chest x-ray findings have been stable.  No indication for chest tube placement.    Bleeding from oral cavity Melena Seems to be stable.  Mild drop in hemoglobin noted.  Continue to monitor. Patient is on Xarelto and was on heparin.  Will discontinue both and monitor H&H stable.  Acute renal failure on chronic kidney disease  stage III  Hyperkalemia Elevated BUN.  Baseline renal function not entirely known.  There is a creatinine measurement from 2017 of 1.28.  Creatinine was 0.9 in August 2018.   Creatinine has been elevated here in the hospital along with her BUN.  She was given Lasix for poor urine output with brisk diuresis.  Continue Lokelma She has chronic lower extremity edema, right greater than left.  She does take diuretics at home which was placed on hold at admission.  ARB is on hold. Elevated BUN could be secondary to bleeding.  Monitor.  H&H for now stable.  History of chronic atrial fibrillation/episodes of V. tach Chads 2 vascular score is at least 3.  Patient was noted to have V. tach after she was intubated and she was placed on amiodarone infusion which was held due to bradycardia.  No recurrence of ventricular arrhythmia.   She developed atrial fibrillation with RVR 2 days ago.  She was given amiodarone bolus and started on infusion again.  Switched over to amiodarone through feeding tube.  She again went into rapid atrial fibrillation this morning.  Was given metoprolol with no significant improvement.  Another bolus of amiodarone given.  She is now in sinus rhythm.  Continue to monitor.  Continue beta-blocker. High-sensitivity troponin I was 81 and then 74.  Not significantly elevated.  Echocardiogram normal systolic function.  Diastolic dysfunction was noted.  No significant valvular abnormalities.  History of asthma versus COPD Continue her home medications.  She is not on home oxygen.  Hypothyroidism Continue levothyroxine.  As noted above her TSH was noted to be low.  Free T4 is 1.0.  No further work-up.  Low TSH likely sick euthyroid.  History of stroke Patient is on Xarelto and aspirin.  Switched over to IV heparin in case she needed to undergo chest tube placement.  Due to concern for bleeding we are holding both of them. Not noted to be on statin.  Reason for this is not entirely clear.   This can be addressed when she is over her acute illness.  History of vitamin D deficiency Continue supplementation.  History of DVT She was on Xarelto.  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  Stable.  Diabetes mellitus type 2 with hyperglycemia CBGs are still poorly controlled.  Continue to adjust dose of Levemir.  HbA1c 7.0.  Elevated CBG most likely due to steroids.  Should improve as steroid is tapered down.    Morbid obesity BMI 42.43.  Nutrition Continue tube feedings.  Constipation Patient still has not had a bowel movement despite laxatives.  We will order suppositories/enemas.  DVT Prophylaxis: Holding therapeutic prophylaxis.  On SCD. PUD Prophylaxis: On Protonix Code Status: Full code Family Communication: Discussed with family.  Explained poor prognosis.  Monitor.  Goal remains full aggressive care for now.  Although family understand patient's prognosis. Disposition Plan: She will remain in the ICU.   Medications:  Scheduled: . amiodarone  400 mg Per Tube BID   Followed by  . [START ON 11/29/2018] amiodarone  200 mg Per Tube BID   Followed by  . [START ON 12/04/2018] amiodarone  200 mg Per Tube Daily  . artificial tears  1 application Both Eyes [id number]  . chlorhexidine gluconate (MEDLINE KIT)  15 mL Mouth Rinse BID  . dexamethasone (DECADRON) injection  4 mg Intravenous Q24H  . feeding supplement (PRO-STAT SUGAR FREE 64)  30 mL Per Tube QID  . feeding supplement (VITAL AF 1.2 CAL)  1,000 mL Per Tube Q24H  . ferrous sulfate  300 mg Per Tube TID WC  . fluticasone furoate-vilanterol  1 puff Inhalation Daily  . free water  200 mL Per Tube Q8H  . furosemide  40 mg Intravenous Q6H  . gabapentin  100 mg Per Tube Q8H  . insulin aspart  0-20 Units Subcutaneous Q4H  . insulin aspart  8 Units Subcutaneous Q4H  . insulin detemir  45 Units  Subcutaneous BID  . levothyroxine  112 mcg Oral Q0600  . mouth rinse  15 mL Mouth Rinse 10 times per day  . metolazone  2.5 mg Oral Once  . multivitamin with minerals  1 tablet Per Tube Daily  . pantoprazole sodium  40 mg Per Tube BID  . [START ON 11/27/2018] polyethylene glycol  17 g Per Tube Daily  . vitamin C  500 mg Oral Daily  . zinc sulfate  220 mg Oral Daily   Continuous: . sodium chloride Stopped (11/23/18 0159)  . fentaNYL infusion INTRAVENOUS 150 mcg/hr (11/26/18 1735)  . midazolam 3 mg/hr (11/26/18 1735)  . phenylephrine 80 mcg/min (11/26/18 1735)   WRU:EAVWUJ chloride, acetaminophen **OR** acetaminophen, albuterol, fentaNYL, metoprolol tartrate, midazolam, sennosides, sodium phosphate, traZODone   Objective:  Vital Signs  Vitals:   11/26/18 1500 11/26/18 1526 11/26/18 1600 11/26/18 1700  BP: 103/68 97/68 108/64 (!) 100/52  Pulse: (!) 102 (!) 102 (!) 101 (!) 101  Resp: 16 (!) 35 (!) 27 (!) 28  Temp:   100 F (37.8 C)   TempSrc:   Axillary   SpO2: 91% 91% 92% 93%  Weight:      Height:        Intake/Output Summary (Last 24 hours) at 11/26/2018 1829 Last data filed at 11/26/2018 1735 Gross per 24 hour  Intake 1871.69 ml  Output 2725 ml  Net -853.31 ml   Filed Weights   11/21/18 0500 11/23/18 0500 11/24/18 0500  Weight: 112.1 kg 111.5 kg 112.2 kg   General appearance: Remains intubated and sedated. Resp: Coarse breath sounds bilaterally.  Few crackles at the bases.  No wheezing or rhonchi. Cardio: S1-S2 irregularly irregular.  Tachycardic. GI: Abdomen is soft.  Nontender nondistended.  Bowel sounds are present normal.  No masses organomegaly Extremities: She has chronic lower extremity edema right greater than left.  Hyperpigmentation over the left leg. Neurologic: Sedated    Lab Results:  Data Reviewed: I have personally reviewed following labs and imaging studies  CBC: Recent Labs  Lab 11/20/18 0651  11/21/18 0410  11/22/18 0550  11/23/18 0500   11/24/18 0505 11/25/18 0515 11/26/18 0548 11/26/18 1525 11/26/18 1554  WBC 16.6*  --  16.6*  --  17.4*  --  17.8*  --  19.8* 19.6* 20.4* 28.3*  --   NEUTROABS 13.9*  --  14.8*  --  15.7*  --   --   --   --   --   --   --   --   HGB 12.8   < > 12.7   < > 12.9   < >  12.6   < > 12.3 11.1* 11.5* 11.4* 12.2  HCT 42.1   < > 41.1   < > 42.8   < > 42.5   < > 41.7 37.3 39.6 37.9 36.0  MCV 87.9  --  89.3  --  88.4  --  89.7  --  89.9 91.0 92.3 91.1  --   PLT 186  --  179  --  179  --  180  --  177 144* 139* 149*  --    < > = values in this interval not displayed.    Basic Metabolic Panel: Recent Labs  Lab 11/19/18 2020 11/20/18 0651  11/20/18 1611  11/21/18 0410  11/21/18 1630  11/23/18 0500  11/24/18 0505 11/25/18 0515 11/26/18 0548 11/26/18 1525 11/26/18 1554  NA 137 136   < >  --    < > 138   < >  --    < > 143   < > 145 146* 149* 149* 147*  K 5.4* 5.4*   < >  --    < > 5.0   < >  --    < > 5.1   < > 5.2* 5.5* 5.4* 5.8* 5.5*  CL 101 101  --   --   --  105  --   --    < > 105  --  109 110 108 108  --   CO2 23 23  --   --   --  24  --   --    < > 28  --  29 30 33* 30  --   GLUCOSE 274* 247*  --   --   --  322*  --   --    < > 329*  --  325* 278* 299* 227*  --   BUN 34* 39*  --   --   --  49*  --   --    < > 80*  --  76* 97* 130* 150*  --   CREATININE 1.24* 1.35*  --   --   --  1.57*  --   --    < > 1.74*  --  1.35* 1.52* 1.79* 2.10*  --   CALCIUM 8.8* 8.6*  --   --   --  8.4*  --   --    < > 8.5*  --  8.2* 7.8* 7.8* 7.9*  --   MG 2.2 2.1  --  2.2  --  2.2  --  2.3  --   --   --   --   --   --  3.0*  --   PHOS 5.5* 4.1  --  5.0*  --  3.9  --  4.2  --   --   --   --   --   --   --   --    < > = values in this interval not displayed.    GFR: Estimated Creatinine Clearance: 30.9 mL/min (A) (by C-G formula based on SCr of 2.1 mg/dL (H)).  Liver Function Tests: Recent Labs  Lab 11/19/18 2020 11/20/18 0651 11/21/18 0410 11/22/18 0550  AST 71* 52* 36 27  ALT 44 41 35 33  ALKPHOS  101 96 91 88  BILITOT 0.8 0.5 0.4 0.5  PROT 7.2 7.0 6.4* 6.4*  ALBUMIN 2.7* 2.6* 2.5* 2.5*      CBG: Recent Labs  Lab 11/25/18 2342 11/26/18 0412 11/26/18 0755 11/26/18 1202 11/26/18  [place]* 222*      Recent Results (from the past 240 hour(s))  SARS Coronavirus 2 (CEPHEID- Performed in [place] hospital lab), Hosp Order     Status: Abnormal   Collection Time: 11/01/2018  8:29 PM   Specimen: Nasopharyngeal Swab  Result Value Ref Range Status   SARS Coronavirus 2 POSITIVE (A) NEGATIVE Final    Comment: CRITICAL RESULT CALLED TO, READ BACK BY AND VERIFIED WITH: RN P HOWARD AT 0106 11/19/18 CRUICKSHANK A (NOTE) If result is NEGATIVE SARS-CoV-2 target nucleic acids are NOT DETECTED. The SARS-CoV-2 RNA is generally detectable in upper and lower  respiratory specimens during the acute phase of infection. The lowest  concentration of SARS-CoV-2 viral copies this assay can detect is 250  copies / mL. A negative result does not preclude SARS-CoV-2 infection  and should not be used as the sole basis for treatment or other  patient management decisions.  A negative result may occur with  improper specimen collection / handling, submission of specimen other  than nasopharyngeal swab, presence of viral mutation(s) within the  areas targeted by this assay, and inadequate number of viral copies  (<250 copies / mL). A negative result must be combined with clinical  observations, patient history, and epidemiological information. If result is POSITIVE SARS-CoV-2 target nucleic acids a re DETECTED. The SARS-CoV-2 RNA is generally detectable in upper and lower  respiratory specimens during the acute phase of infection.  Positive  results are indicative of active infection with SARS-CoV-2.  Clinical  correlation with patient history and other diagnostic information is  necessary to determine patient infection status.  Positive results do  not rule out bacterial  infection or co-infection with other viruses. If result is PRESUMPTIVE POSTIVE SARS-CoV-2 nucleic acids MAY BE PRESENT.   A presumptive positive result was obtained on the submitted specimen  and confirmed on repeat testing.  While 2019 novel coronavirus  (SARS-CoV-2) nucleic acids may be present in the submitted sample  additional confirmatory testing may be necessary for epidemiological  and / or clinical management purposes  to differentiate between  SARS-CoV-2 and other Sarbecovirus currently known to infect humans.  If clinically indicated additional testing with an alternate test  methodolo gy [phone]) is advised. The SARS-CoV-2 RNA is generally  detectable in upper and lower respiratory specimens during the acute  phase of infection. The expected result is Negative. Fact Sheet for Patients:  StrictlyIdeas.no Fact Sheet for Healthcare Providers: BankingDealers.co.za This test is not yet approved or cleared by the [place] FDA and has been authorized for detection and/or diagnosis of SARS-CoV-2 by FDA under an Emergency Use Authorization (EUA).  This EUA will remain in effect (meaning this test can be used) for the duration of the COVID-19 declaration under Section 564(b)(1) of the Act, 21 U.S.C. section 360bbb-3(b)(1), unless the authorization is terminated or revoked sooner. Performed at [facility], [place] [address], [place], [place] [address]   MRSA PCR Screening     Status: None   Collection Time: 11/08/2018 11:15 PM   Specimen: Nasal Mucosa; Nasopharyngeal  Result Value Ref Range Status   MRSA by PCR NEGATIVE NEGATIVE Final    Comment:        The GeneXpert MRSA Assay (FDA approved for NASAL specimens only), is one component of a comprehensive MRSA colonization surveillance program. It is not intended to diagnose MRSA infection nor to guide or monitor treatment for MRSA infections. Performed at  [facility], [place] [address] [place], [place] [address]       Radiology Studies: Dg Chest Port 1 View  Result Date: 11/26/2018 CLINICAL DATA:  COVID-19, intubation EXAM: PORTABLE CHEST 1 VIEW COMPARISON:  Portable exam 0903 hours compared to 11/25/2018 FINDINGS: Tip of endotracheal tube projects 3.2 cm above carina. Feeding tube extends into abdomen with tip projecting over duodenal bulb. Numerous EKG leads project over chest. Stable heart size and mediastinal contours. Survey aorta Scattered airspace infiltrates in the mid to lower lungs consistent with pneumonia. Bones demineralized. No pneumothorax. IMPRESSION: BILATERAL pulmonary infiltrates consistent with pneumonia. Electronically Signed   By: Lavonia Dana M.D.   On: 11/26/2018 12:13   Dg Chest Port 1 View  Result Date: 11/25/2018 CLINICAL DATA:  COVID-19 pneumonia EXAM: PORTABLE CHEST 1 VIEW COMPARISON:  Multiple prior chest films. FINDINGS: The endotracheal tube is 2.7 cm above the carina. The NG tube is coursing down the esophagus and into the stomach. The left IJ central venous catheter is stable with its tip near the brachiocephalic SVC junction. The heart is mildly enlarged but stable. Stable tortuosity and calcification of the thoracic aorta. Persistent bilateral infiltrates, left greater than right. No definite pleural effusions. Stable fairly marked elevation of the right hemidiaphragm. IMPRESSION: 1. Stable support apparatus as detailed above. 2. Stable cardiac enlargement. 3. Persistent bilateral infiltrates. Electronically Signed   By: Marijo Sanes M.D.   On: 11/25/2018 09:04   Dg Abd Portable 1v  Result Date: 11/26/2018 CLINICAL DATA:  COVID-19, constipation EXAM: PORTABLE ABDOMEN - 1 VIEW COMPARISON:  Portable exam 0903 hours compared to 11/19/2018 FINDINGS: Tip of feeding tube projects over duodenal bulb. Normal stool burden. Small amount retained contrast in colon. No bowel dilatation or bowel wall  thickening. Bones demineralized. IMPRESSION: Nonobstructive bowel gas pattern with normal stool burden. Electronically Signed   By: Lavonia Dana M.D.   On: 11/26/2018 12:13       LOS: 8 days   [facility] Pager on [email or web address]  11/26/2018, 6:29 PM

## 2018-11-26 NOTE — Progress Notes (Signed)
Inpatient Diabetes Program Recommendations  AACE/ADA: New Consensus Statement on Inpatient Glycemic Control (2015)  Target Ranges:  Prepandial:   less than 140 mg/dL      Peak postprandial:   less than 180 mg/dL (1-2 hours)      Critically ill patients:  140 - 180 mg/dL   Lab Results  Component Value Date   GLUCAP 274 (H) 11/26/2018   HGBA1C 7.0 (H) 11/14/2018    Review of Glycemic Control  Results for ZOHAR, LAING (MRN [id number]) as of 11/26/2018 10:49  Ref. Range 11/25/2018 08:01 11/25/2018 12:18 11/25/2018 15:34 11/25/2018 19:25 11/25/2018 23:42 11/26/2018 04:12 11/26/2018 07:55  Glucose-Capillary Latest Ref Range: 70 - 99 mg/dL 258 (H) 237 (H) 253 (H) 291 (H) 311 (H) 294 (H) 274 (H)   Diabetes history: DM 2 Outpatient Diabetes medications: None Current orders for Inpatient glycemic control:  Novolog resistant q 4 hours, Levemir 45 units bid, Novolog 4 units q 4 hours  Decadron dose decreased to 4 mg Q24 hours  Inpatient Diabetes Program Recommendations:    Consider Levemir 50 units bid  Noted Tube Feed coverage increased this am.  Thanks  Tama Headings RN, MSN, BC-ADM Inpatient Diabetes Coordinator Team Pager [phone] (8a-5p)

## 2018-11-26 NOTE — Progress Notes (Signed)
Family of patient called for updates. Plan of care discussed with pts daughter. All questions and concerns addressed and no additional questions at this time.

## 2018-11-26 NOTE — Progress Notes (Signed)
Night shift hospitalist.  Received sign out from Dr. Curly Rim to follow BMP results, which were scheduled for 2000 on 11/25/2018.  I added a stat BMP order 2330.  Earlier BMP order from Dr. [place] Pink was subsequently canceled, since apparently there was a new order for BMP.  Another stat BMP order was placed at [place].  I will be following results.   Tennis Must, MD

## 2018-11-26 NOTE — Progress Notes (Addendum)
NAME:  [name] [name], MRN:  [id number], DOB:  [birth year], LOS: 62 ADMISSION DATE:  11/08/2018, CONSULTATION DATE: July 23 REFERRING MD: Dr. Candiss Norse, CHIEF COMPLAINT: Dyspnea  Brief History   68 year old female admitted for severe acute respiratory failure with hypoxemia due to COVID-19 pneumonia.  Past Medical History  Diabetes mellitus type 2 Obesity Asthma, has been on inhalers in the past, never smoked cigarettes CKD stage III Chronic A. fib History of spine surgery History of Graves' disease, now hypothyroid History of stroke History of DVT Lives in an assisted living facility [facility] Events   July 22 admitted July 23 intubated July 24 prone positioning July 26 subcutaneous air, no pneumothorax, afib with RVR again July 29 decreasing sedation, diuresis July 30 decreasing sedation, diuresis  Consults:  PCCM  Procedures:  7/23 ETT > 7/23 L IJ CVL >  Significant Diagnostic Tests:  7/24 Echo > LVEF > 65%, impaired relaxation, RV normal systolic function, mild thickening of aortic valve  Micro Data:  July 22 SARS COV 2 Positive  Antimicrobials:  July 22 remdesivir> 7/26 July 22 decadron July 22> 23 Toclizumab   Interim history/subjective:   Patient remains critically ill in the intensive care unit on mechanical ventilation.  Discussed respiratory care with RT this morning.  Patient was switched from [facility] to pressure control ventilation.  Objective   Blood pressure (!) 102/52, pulse 96, temperature 98.7 F (37.1 C), temperature source Oral, resp. rate (!) 30, height 5\' 3"  (1.6 m), weight 112.2 kg, SpO2 (!) 86 %. CVP:  [4 mmHg-18 mmHg] 14 mmHg  Vent Mode: PCV FiO2 (%):  [60 %-70 %] 70 % Set Rate:  [30 bmp] 30 bmp Vt Set:  [360 mL] 360 mL PEEP:  [8 cmH20-14 cmH20] 14 cmH20 Plateau Pressure:  [18 cmH20-25 cmH20] 18 cmH20   Intake/Output Summary (Last 24 hours) at 11/26/2018 0840 Last data filed at 11/26/2018 0800 Gross per 24 hour  Intake  1964.16 ml  Output 3425 ml  Net -1460.84 ml   Filed Weights   11/21/18 0500 11/23/18 0500 11/24/18 0500  Weight: 112.1 kg 111.5 kg 112.2 kg    Examination:  General: Intubated on mechanical life support, sedated, critically ill HENT: NCAT, endotracheal tube in place PULM: Bilateral ventilated breath sounds CV: Tachycardic, regular, S1-S2 GI: Soft, nontender, nondistended, bowel sounds present MSK: Dependent edema in the upper and lower extremities Neuro: Sedated on mechanical ventilation Derm: Mild crepitus on the anterior neck  Chest x-ray 11/25/2018: stable support lines and tubes.  Bilateral infiltrates The patient's images have been independently reviewed by me.     Resolved Hospital Problem list     Assessment & Plan:   Severe acute respiratory failure with hypoxemia secondary to ARDS from COVID pneumonia requiring intubation and mechanical ventilation. Continue mechanical ventilation per ARDS protocol Target TVol 6-8cc/kgIBW Target Plateau Pressure < 30cm H20 Target driving pressure less than 15 cm of water Target PaO2 55-65: titrate PEEP/FiO2 per protocol As long as PaO2 to FiO2 ratio is less than 1:150 position in prone position for 16 hours a day Check CVP daily if CVL in place Target CVP less than 4, diurese as necessary Ventilator associated pneumonia prevention protocol 11/26/2018: Continue Decadron Patient has completed remdesivir and tocolizumab  Additional diuresis today, positive cumulative fluid balance 40 mg of Lasix every 6 hours x2 doses and 1 dose of metolazone 2.5 mg she had good urine output with this yesterday. Continue to wean mechanical support as tolerated. Patient was switched  to pressure control ventilation with discussion with RT. Instructed RT to follow tidal volumes closely on pressure control.  Pneumomediastinum Continue to observe clinically  VT 7/23 PM: has not recurred, unclear cause; electrolytes OK, Echo OK Atrial fibrillation  with RVR Patient remains on telemetry Continue scheduled metoprolol 25 mg p.o. every 8 hours Continue oral amiodarone PRN metoprolol 5 mg IV for rapid ventricular rate  Need for sedation for ventilator dyssynchrony: Patient is slowly beginning to improve. Continue to decrease sedation needs. Wean as tolerated to maintain goal RA SS   History of DVT Heparin infusion changed to Xarelto Both on hold due to black stools  Hyperkalemia Lokelma, per [facility]  Hypernatremia Largely related to diuresis yesterday Continue free water replacement Metolazone dosing should help.  Black stools overnight, elevated BUN Possible GI bleeding. Heparin was stopped yesterday transition to Xarelto. Holding Xarelto.  Continue to observe for any overt sign of bleeding Trend hemoglobin Elevated BUN may be related to Decadron.   Best practice:  Diet: start tube feeding Pain/Anxiety/Delirium protocol (if indicated): Sedation with fentanyl and Versed VAP protocol (if indicated): yes DVT prophylaxis: heparin infusion GI prophylaxis: protonix Glucose control: per TR[facility] Mobility: bed rest Code Status: full Family Communication: Per [facility] Disposition: remain in ICU  Labs   CBC: Recent Labs  Lab 11/20/18 0651  11/21/18 0410  11/22/18 0550  11/23/18 0500 11/23/18 1201 11/24/18 0505 11/25/18 0515 11/26/18 0548  WBC 16.6*  --  16.6*  --  17.4*  --  17.8*  --  19.8* 19.6* 20.4*  NEUTROABS 13.9*  --  14.8*  --  15.7*  --   --   --   --   --   --   HGB 12.8   < > 12.7   < > 12.9   < > 12.6 13.3 12.3 11.1* 11.5*  HCT 42.1   < > 41.1   < > 42.8   < > 42.5 39.0 41.7 37.3 39.6  MCV 87.9  --  89.3  --  88.4  --  89.7  --  89.9 91.0 92.3  PLT 186  --  179  --  179  --  180  --  177 144* 139*   < > = values in this interval not displayed.    Basic Metabolic Panel: Recent Labs  Lab 11/19/18 2020 11/20/18 0651  11/20/18 1611  11/21/18 0410  11/21/18 1630 11/22/18 0550  11/23/18 0500 11/23/18 1201  11/24/18 0505 11/25/18 0515 11/26/18 0548  NA 137 136   < >  --    < > 138   < >  --  142   < > 143 143 145 146* 149*  K 5.4* 5.4*   < >  --    < > 5.0   < >  --  4.8   < > 5.1 4.8 5.2* 5.5* 5.4*  CL 101 101  --   --   --  105  --   --  105  --  105  --  109 110 108  CO2 23 23  --   --   --  24  --   --  28  --  28  --  29 30 33*  GLUCOSE 274* 247*  --   --   --  322*  --   --  287*  --  329*  --  325* 278* 299*  BUN 34* 39*  --   --   --  49*  --   --  57*  --  80*  --  76* 97* 130*  CREATININE 1.24* 1.35*  --   --   --  1.57*  --   --  1.51*  --  1.74*  --  1.35* 1.52* 1.79*  CALCIUM 8.8* 8.6*  --   --   --  8.4*  --   --  8.5*  --  8.5*  --  8.2* 7.8* 7.8*  MG 2.2 2.1  --  2.2  --  2.2  --  2.3  --   --   --   --   --   --   --   PHOS 5.5* 4.1  --  5.0*  --  3.9  --  4.2  --   --   --   --   --   --   --    < > = values in this interval not displayed.   GFR: Estimated Creatinine Clearance: 36.2 mL/min (A) (by C-G formula based on SCr of 1.79 mg/dL (H)). Recent Labs  Lab 11/23/18 0500 11/24/18 0505 11/25/18 0515 11/26/18 0548  WBC 17.8* 19.8* 19.6* 20.4*    Liver Function Tests: Recent Labs  Lab 11/19/18 2020 11/20/18 0651 11/21/18 0410 11/22/18 0550  AST 71* 52* 36 27  ALT 44 41 35 33  ALKPHOS 101 96 91 88  BILITOT 0.8 0.5 0.4 0.5  PROT 7.2 7.0 6.4* 6.4*  ALBUMIN 2.7* 2.6* 2.5* 2.5*   No results for input(s): LIPASE, AMYLASE in the last 168 hours. No results for input(s): AMMONIA in the last 168 hours.  ABG    Component Value Date/Time   PHART 7.324 (L) 11/23/2018 1201   PCO2ART 59.2 (H) 11/23/2018 1201   PO2ART 62.0 (L) 11/23/2018 1201   HCO3 30.9 (H) 11/23/2018 1201   TCO2 33 (H) 11/23/2018 1201   ACIDBASEDEF 3.0 (H) 11/21/2018 0628   O2SAT 89.0 11/23/2018 1201     Coagulation Profile: No results for input(s): INR, PROTIME in the last 168 hours.  Cardiac Enzymes: No results for input(s): CKTOTAL, CKMB, CKMBINDEX, TROPONINI in the last 168 hours.   HbA1C: Hgb A1c MFr Bld  Date/Time Value Ref Range Status  11/09/2018 08:00 PM 7.0 (H) 4.8 - 5.6 % Final    Comment:    (NOTE) Pre diabetes:          5.7%-6.4% Diabetes:              >6.4% Glycemic control for   <7.0% adults with diabetes   10/18/2016 10:47 AM 6.0 (H) 4.8 - 5.6 % Final    Comment:    (NOTE)         Pre-diabetes: 5.7 - 6.4         Diabetes: >6.4         Glycemic control for adults with diabetes: <7.0     CBG: Recent Labs  Lab 11/25/18 1218 11/25/18 1534 11/25/18 1925 11/25/18 2342 11/26/18 0412  GLUCAP 237* 253* 291* 311* 294*      This patient is critically ill with multiple organ system failure; which, requires frequent high complexity decision making, assessment, support, evaluation, and titration of therapies. This was completed through the application of advanced monitoring technologies and extensive interpretation of multiple databases. During this encounter critical care time was devoted to patient care services described in this note for 32 minutes.  Garner Nash, DO [facility] 11/26/2018 8:40 AM  Personal pager: [phone] If unanswered, please page CCM On-call: [phone]

## 2018-11-26 NOTE — Progress Notes (Signed)
Nutrition Follow-up RD working remotely.  DOCUMENTATION CODES:   Morbid obesity  INTERVENTION:   Continue TF via post-pyloric Cortrak tube:   Vital AF 1.2 at 40 ml/h (960 ml per day)   Pro-stat 60 ml BID   MVI daily   Provides 1552 kcal, 132 gm protein, 779 ml free water daily  NUTRITION DIAGNOSIS:   Inadequate oral intake related to inability to eat as evidenced by NPO status.  Ongoing   GOAL:   Provide needs based on ASPEN/SCCM guidelines  Met with TF  MONITOR:   Vent status, Labs, Skin, TF tolerance  ASSESSMENT:   68 yo female admitted with severe respiratory failure r/t COVID-19 PNA. PMH includes DM-2, obesity, asthma, CKD stage 3, chronic A fib, stroke, DVT.  Patient remains intubated on ventilator support MV: 13.8 L/min Temp (24hrs), Avg:99 F (37.2 C), Min:98.4 F (36.9 C), Max:100.4 F (38 C)   Cortrak tube was placed yesterday. Receiving Vital AF 1.2 at 40 ml/h with Pro-stat 30 ml QID to provide 1552 kcal, 132 gm protein, 779 ml free water daily. Tolerating TF well.   Proned on 7/24. Currently not proning.  Labs reviewed. Sodium 149 (H), potassium 5.4 (H), BUN 130 (H), creatinine 1.79 (H) CBG's: [phone]  Medications reviewed and include decadron, ferrous sulfate, lasix, novolog, levemir, MVI, vitamin C, zinc.   Diet Order:   Diet Order    None      EDUCATION NEEDS:   Not appropriate for education at this time  Skin:  Skin Assessment: Reviewed RN Assessment  Last BM:  7/30 type 7  Height:   Ht Readings from Last 1 Encounters:  11/13/2018 '5\' 3"'$  (1.6 m)    Weight:   Wt Readings from Last 1 Encounters:  11/24/18 112.2 kg    Ideal Body Weight:  52.3 kg  BMI:  Body mass index is 43.82 kg/m.  Estimated Nutritional Needs:   Kcal:  0354-6568  Protein:  110-130 gm  Fluid:  >/= 1.6 L    Molli Barrows, RD, LDN, [place] Pager [phone] After Hours Pager [phone]

## 2018-11-26 NOTE — Progress Notes (Signed)
PROGRESS NOTE  [name] [name] OAC:[id number] DOB: [birth year] DOA: 11/07/2018  PCP: Nicholos Johns, MD  Brief History/Interval Summary: 68 y.o. female, with history of morbid obesity, DM type II, COPD not on oxygen, hypertension, CKD 3 baseline creatinine around 1.4, chronic atrial fibrillation [place] vas 2 score of at least 3 on Xarelto and Cardizem, spine surgeries in the past, chronic fluid retention, history of Graves' disease now hypothyroid, history of CVA, history of DVT who lives at [facility] and was likely exposed to COVID-19 infection about 10 to 14 days prior to this admission.    She was experiencing fever, body aches and developed shortness of breath.  She finally decided to come into the emergency department at [facility].  She was found to be positive for COVID-19.  Was noted to have hypoxia.  She was hospitalized for further management.  Reason for Visit: Acute respiratory failure with hypoxia due to COVID-19  Consultants: Pulmonology  Procedures:   Transthoracic echocardiogram 7/24 1. The left ventricle has hyperdynamic systolic function, with an ejection fraction of >65%. The cavity size was normal. Left ventricular diastolic Doppler parameters are consistent with impaired relaxation.  2. The right ventricle has normal systolc function. The cavity was normal. There is no increase in right ventricular wall thickness. Right ventricular systolic pressure is normal.  3. Mild thickening of the aortic valve. No stenosis of the aortic valve.  4. The aortic root and ascending aorta are normal in size and structure.    Antibiotics: Anti-infectives (From admission, onward)   Start     Dose/Rate Route Frequency Ordered Stop   11/19/18 2030  remdesivir 100 mg in sodium chloride 0.9 % 250 mL IVPB     100 mg 500 mL/hr over 30 Minutes Intravenous Every 24 hours 11/23/2018 1855 11/22/18 2204   11/10/2018 2030  remdesivir 200 mg in sodium chloride 0.9 % 250 mL IVPB     200 mg  500 mL/hr over 30 Minutes Intravenous Once 11/27/2018 1855 11/01/2018 2056       Subjective/Interval History: On ventilator.  Had significant dyssynchrony this morning.  Becomes hypotensive later in the afternoon requiring pressors.  RN reported overnight black BM loose and multiple.  Assessment/Plan:  Acute Hypoxic Resp. Failure due to Acute Covid 19 Viral Illness  COVID-19 Labs  Recent Labs    11/24/18 0505 11/25/18 0515 11/26/18 0548  DDIMER 13.58* 9.59* 9.30*  CRP  --  2.3*  --     Lab Results  Component Value Date   SARSCOV2NAA POSITIVE (A) 11/08/2018     Fever: Remains afebrile Oxygen requirements: Mechanical ventilation.  65% FiO2.  Saturating in the 90s.    Antibacterials: None Remdesivir: Completed course Steroids: Dexamethasone 6 mg once daily.  Being tapered.  Diuretics: Received Lasix yesterday.  Consider additional dose today. Actemra: Has received 2 doses, 7/22 and 7/23 Vitamin C and Zinc: Continue DVT Prophylaxis: Patient transitioned from rivaroxaban to IV heparin.  Will be transition back to rivaroxaban today  Patient remains intubated and sedated.  Pulmonology is following.  She was given Lasix yesterday.  She was given Lasix and metolazone today.    From a COVID-19 standpoint patient has completed course of Remdesivir.  She remains on steroids which is being tapered.  Her inflammatory markers have improved.  CRP is down to 2.3.  D-dimer 9.59.  WBC is stable.   Subcutaneous emphysema/pneumomediastinum Overnight on 7/26 she was also found to have subcutaneous emphysema.  Chest x-ray also which suggests mediastinal emphysema.  Chest x-ray findings have been stable.  No indication for chest tube placement.    Bleeding from oral cavity Melena Seems to be stable.  Mild drop in hemoglobin noted.  Continue to monitor. Patient is on Xarelto and was on heparin.  Will discontinue both and monitor.  Acute renal failure on chronic kidney disease stage III per  kalemia Baseline renal function not entirely known.  There is a creatinine measurement from 2017 of 1.28.  Creatinine was 0.9 in August 2018.   Creatinine has been elevated here in the hospital along with her BUN.  She was given Lasix for poor urine output with brisk diuresis.  Potassium was 5.2 yesterday.  She was given Lokelma.  Potassium noted to be 5.5 this morning.  She will be given additional doses of Lokelma today.  Recheck labs tomorrow. She has chronic lower extremity edema, right greater than left.  She does take diuretics at home which was placed on hold at admission.  ARB is on hold.    History of chronic atrial fibrillation/episodes of V. tach Chads 2 vascular score is at least 3.  Patient was noted to have V. tach after she was intubated and she was placed on amiodarone infusion which was held due to bradycardia.  No recurrence of ventricular arrhythmia.   She developed atrial fibrillation with RVR 2 days ago.  She was given amiodarone bolus and started on infusion again.  Switched over to amiodarone through feeding tube.  She again went into rapid atrial fibrillation this morning.  Was given metoprolol with no significant improvement.  Another bolus of amiodarone given.  She is now in sinus rhythm.  Continue to monitor.  Continue beta-blocker. High-sensitivity troponin I was 81 and then 74.  Not significantly elevated.  Echocardiogram normal systolic function.  Diastolic dysfunction was noted.  No significant valvular abnormalities.  History of asthma versus COPD Continue her home medications.  She is not on home oxygen.  Hypothyroidism Continue levothyroxine.  As noted above her TSH was noted to be low.  Free T4 is 1.0.  No further work-up.  Low TSH likely sick euthyroid.  History of stroke Patient is on Xarelto and aspirin.  Switched over to IV heparin in case she needed to undergo chest tube placement.  Due to concern for bleeding we are holding both of them. Not noted to be on  statin.  Reason for this is not entirely clear.  This can be addressed when she is over her acute illness.  History of vitamin D deficiency Continue supplementation.  History of DVT She was on Xarelto.  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  Stable.  Diabetes mellitus type 2 with hyperglycemia CBGs are still poorly controlled.  Continue to adjust dose of Levemir.  HbA1c 7.0.  Elevated CBG most likely due to steroids.  Should improve as steroid is tapered down.    Morbid obesity BMI 42.43.  Nutrition Continue tube feedings.  Constipation Patient still has not had a bowel movement despite laxatives.  We will order suppositories/enemas.  DVT Prophylaxis: Holding therapeutic prophylaxis.  On SCD. PUD Prophylaxis: On Protonix Code Status: Full code Family Communication: Discussed with family.  Explained poor prognosis.  Monitor.  Goal remains full aggressive care for now.  Although family understand patient's prognosis. Disposition Plan: She will remain in the ICU.   Medications:  Scheduled: . amiodarone  400 mg Per  Tube BID   Followed by  . [START ON 11/29/2018] amiodarone  200 mg Per Tube BID   Followed by  . [START ON 12/04/2018] amiodarone  200 mg Per Tube Daily  . artificial tears  1 application Both Eyes O2H  . chlorhexidine gluconate (MEDLINE KIT)  15 mL Mouth Rinse BID  . dexamethasone (DECADRON) injection  4 mg Intravenous Q24H  . feeding supplement (PRO-STAT SUGAR FREE 64)  30 mL Per Tube QID  . feeding supplement (VITAL AF 1.2 CAL)  1,000 mL Per Tube Q24H  . ferrous sulfate  300 mg Per Tube TID WC  . fluticasone furoate-vilanterol  1 puff Inhalation Daily  . free water  200 mL Per Tube Q8H  . furosemide  40 mg Intravenous Q6H  . gabapentin  100 mg Per Tube Q8H  . insulin aspart  0-20 Units Subcutaneous Q4H  . insulin aspart  8 Units  Subcutaneous Q4H  . insulin detemir  45 Units Subcutaneous BID  . levothyroxine  112 mcg Oral Q0600  . mouth rinse  15 mL Mouth Rinse 10 times per day  . metolazone  2.5 mg Oral Once  . multivitamin with minerals  1 tablet Per Tube Daily  . pantoprazole sodium  40 mg Per Tube BID  . [START ON 11/27/2018] polyethylene glycol  17 g Per Tube Daily  . vitamin C  500 mg Oral Daily  . zinc sulfate  220 mg Oral Daily   Continuous: . sodium chloride Stopped (11/23/18 0159)  . fentaNYL infusion INTRAVENOUS 150 mcg/hr (11/26/18 1735)  . midazolam 3 mg/hr (11/26/18 1735)  . phenylephrine 80 mcg/min (11/26/18 1735)   UTM:LYYTKP chloride, acetaminophen **OR** acetaminophen, albuterol, fentaNYL, metoprolol tartrate, midazolam, sennosides, sodium phosphate, traZODone   Objective:  Vital Signs  Vitals:   11/26/18 1500 11/26/18 1526 11/26/18 1600 11/26/18 1700  BP: 103/68 97/68 108/64 (!) 100/52  Pulse: (!) 102 (!) 102 (!) 101 (!) 101  Resp: 16 (!) 35 (!) 27 (!) 28  Temp:   100 F (37.8 C)   TempSrc:   Axillary   SpO2: 91% 91% 92% 93%  Weight:      Height:        Intake/Output Summary (Last 24 hours) at 11/26/2018 1826 Last data filed at 11/26/2018 1735 Gross per 24 hour  Intake 1871.69 ml  Output 2725 ml  Net -853.31 ml   Filed Weights   11/21/18 0500 11/23/18 0500 11/24/18 0500  Weight: 112.1 kg 111.5 kg 112.2 kg   General appearance: Remains intubated and sedated. Resp: Coarse breath sounds bilaterally.  Few crackles at the bases.  No wheezing or rhonchi. Cardio: S1-S2 irregularly irregular.  Tachycardic. GI: Abdomen is soft.  Nontender nondistended.  Bowel sounds are present normal.  No masses organomegaly Extremities: She has chronic lower extremity edema right greater than left.  Hyperpigmentation over the left leg. Neurologic: Sedated    Lab Results:  Data Reviewed: I have personally reviewed following labs and imaging studies  CBC: Recent Labs  Lab 11/20/18 0651   11/21/18 0410  11/22/18 0550  11/23/18 0500  11/24/18 0505 11/25/18 0515 11/26/18 0548 11/26/18 1525 11/26/18 1554  WBC 16.6*  --  16.6*  --  17.4*  --  17.8*  --  19.8* 19.6* 20.4* 28.3*  --   NEUTROABS 13.9*  --  14.8*  --  15.7*  --   --   --   --   --   --   --   --  HGB 12.8   < > 12.7   < > 12.9   < > 12.6   < > 12.3 11.1* 11.5* 11.4* 12.2  HCT 42.1   < > 41.1   < > 42.8   < > 42.5   < > 41.7 37.3 39.6 37.9 36.0  MCV 87.9  --  89.3  --  88.4  --  89.7  --  89.9 91.0 92.3 91.1  --   PLT 186  --  179  --  179  --  180  --  177 144* 139* 149*  --    < > = values in this interval not displayed.    Basic Metabolic Panel: Recent Labs  Lab 11/19/18 2020 11/20/18 0651  11/20/18 1611  11/21/18 0410  11/21/18 1630  11/23/18 0500  11/24/18 0505 11/25/18 0515 11/26/18 0548 11/26/18 1525 11/26/18 1554  NA 137 136   < >  --    < > 138   < >  --    < > 143   < > 145 146* 149* 149* 147*  K 5.4* 5.4*   < >  --    < > 5.0   < >  --    < > 5.1   < > 5.2* 5.5* 5.4* 5.8* 5.5*  CL 101 101  --   --   --  105  --   --    < > 105  --  109 110 108 108  --   CO2 23 23  --   --   --  24  --   --    < > 28  --  29 30 33* 30  --   GLUCOSE 274* 247*  --   --   --  322*  --   --    < > 329*  --  325* 278* 299* 227*  --   BUN 34* 39*  --   --   --  49*  --   --    < > 80*  --  76* 97* 130* 150*  --   CREATININE 1.24* 1.35*  --   --   --  1.57*  --   --    < > 1.74*  --  1.35* 1.52* 1.79* 2.10*  --   CALCIUM 8.8* 8.6*  --   --   --  8.4*  --   --    < > 8.5*  --  8.2* 7.8* 7.8* 7.9*  --   MG 2.2 2.1  --  2.2  --  2.2  --  2.3  --   --   --   --   --   --  3.0*  --   PHOS 5.5* 4.1  --  5.0*  --  3.9  --  4.2  --   --   --   --   --   --   --   --    < > = values in this interval not displayed.    GFR: Estimated Creatinine Clearance: 30.9 mL/min (A) (by C-G formula based on SCr of 2.1 mg/dL (H)).  Liver Function Tests: Recent Labs  Lab 11/19/18 2020 11/20/18 0651 11/21/18 0410 11/22/18 0550   AST 71* 52* 36 27  ALT 44 41 35 33  ALKPHOS 101 96 91 88  BILITOT 0.8 0.5 0.4 0.5  PROT 7.2 7.0 6.4* 6.4*  ALBUMIN 2.7* 2.6* 2.5* 2.5*  CBG: Recent Labs  Lab 11/25/18 2342 11/26/18 0412 11/26/18 0755 11/26/18 1202 11/26/18 1644  GLUCAP 311* 294* 274* 218* 222*      Recent Results (from the past 240 hour(s))  SARS Coronavirus 2 (CEPHEID- Performed in [place] hospital lab), Hosp Order     Status: Abnormal   Collection Time: 11/16/2018  8:29 PM   Specimen: Nasopharyngeal Swab  Result Value Ref Range Status   SARS Coronavirus 2 POSITIVE (A) NEGATIVE Final    Comment: CRITICAL RESULT CALLED TO, READ BACK BY AND VERIFIED WITH: RN P HOWARD AT 0106 11/19/18 CRUICKSHANK A (NOTE) If result is NEGATIVE SARS-CoV-2 target nucleic acids are NOT DETECTED. The SARS-CoV-2 RNA is generally detectable in upper and lower  respiratory specimens during the acute phase of infection. The lowest  concentration of SARS-CoV-2 viral copies this assay can detect is 250  copies / mL. A negative result does not preclude SARS-CoV-2 infection  and should not be used as the sole basis for treatment or other  patient management decisions.  A negative result may occur with  improper specimen collection / handling, submission of specimen other  than nasopharyngeal swab, presence of viral mutation(s) within the  areas targeted by this assay, and inadequate number of viral copies  (<250 copies / mL). A negative result must be combined with clinical  observations, patient history, and epidemiological information. If result is POSITIVE SARS-CoV-2 target nucleic acids a re DETECTED. The SARS-CoV-2 RNA is generally detectable in upper and lower  respiratory specimens during the acute phase of infection.  Positive  results are indicative of active infection with SARS-CoV-2.  Clinical  correlation with patient history and other diagnostic information is  necessary to determine patient infection status.   Positive results do  not rule out bacterial infection or co-infection with other viruses. If result is PRESUMPTIVE POSTIVE SARS-CoV-2 nucleic acids MAY BE PRESENT.   A presumptive positive result was obtained on the submitted specimen  and confirmed on repeat testing.  While 2019 novel coronavirus  (SARS-CoV-2) nucleic acids may be present in the submitted sample  additional confirmatory testing may be necessary for epidemiological  and / or clinical management purposes  to differentiate between  SARS-CoV-2 and other Sarbecovirus currently known to infect humans.  If clinically indicated additional testing with an alternate test  methodolo gy [phone]) is advised. The SARS-CoV-2 RNA is generally  detectable in upper and lower respiratory specimens during the acute  phase of infection. The expected result is Negative. Fact Sheet for Patients:  StrictlyIdeas.no Fact Sheet for Healthcare Providers: BankingDealers.co.za This test is not yet approved or cleared by the [place] FDA and has been authorized for detection and/or diagnosis of SARS-CoV-2 by FDA under an Emergency Use Authorization (EUA).  This EUA will remain in effect (meaning this test can be used) for the duration of the COVID-19 declaration under Section 564(b)(1) of the Act, 21 U.S.C. section 360bbb-3(b)(1), unless the authorization is terminated or revoked sooner. Performed at [facility], [place] [address], [employer], [place] [address]   MRSA PCR Screening     Status: None   Collection Time: 11/03/2018 11:15 PM   Specimen: Nasal Mucosa; Nasopharyngeal  Result Value Ref Range Status   MRSA by PCR NEGATIVE NEGATIVE Final    Comment:        The GeneXpert MRSA Assay (FDA approved for NASAL specimens only), is one component of a comprehensive MRSA colonization surveillance program. It is not intended to diagnose MRSA infection  nor to guide or monitor  treatment for MRSA infections. Performed at [facility], [place] [address] [place], [place] [address]       Radiology Studies: Dg Chest Port 1 View  Result Date: 11/26/2018 CLINICAL DATA:  COVID-19, intubation EXAM: PORTABLE CHEST 1 VIEW COMPARISON:  Portable exam 0903 hours compared to 11/25/2018 FINDINGS: Tip of endotracheal tube projects 3.2 cm above carina. Feeding tube extends into abdomen with tip projecting over duodenal bulb. Numerous EKG leads project over chest. Stable heart size and mediastinal contours. Survey aorta Scattered airspace infiltrates in the mid to lower lungs consistent with pneumonia. Bones demineralized. No pneumothorax. IMPRESSION: BILATERAL pulmonary infiltrates consistent with pneumonia. Electronically Signed   By: Lavonia Dana M.D.   On: 11/26/2018 12:13   Dg Chest Port 1 View  Result Date: 11/25/2018 CLINICAL DATA:  COVID-19 pneumonia EXAM: PORTABLE CHEST 1 VIEW COMPARISON:  Multiple prior chest films. FINDINGS: The endotracheal tube is 2.7 cm above the carina. The NG tube is coursing down the esophagus and into the stomach. The left IJ central venous catheter is stable with its tip near the brachiocephalic SVC junction. The heart is mildly enlarged but stable. Stable tortuosity and calcification of the thoracic aorta. Persistent bilateral infiltrates, left greater than right. No definite pleural effusions. Stable fairly marked elevation of the right hemidiaphragm. IMPRESSION: 1. Stable support apparatus as detailed above. 2. Stable cardiac enlargement. 3. Persistent bilateral infiltrates. Electronically Signed   By: Marijo Sanes M.D.   On: 11/25/2018 09:04   Dg Abd Portable 1v  Result Date: 11/26/2018 CLINICAL DATA:  COVID-19, constipation EXAM: PORTABLE ABDOMEN - 1 VIEW COMPARISON:  Portable exam 0903 hours compared to 11/19/2018 FINDINGS: Tip of feeding tube projects over duodenal bulb. Normal stool burden. Small amount retained contrast in  colon. No bowel dilatation or bowel wall thickening. Bones demineralized. IMPRESSION: Nonobstructive bowel gas pattern with normal stool burden. Electronically Signed   By: Lavonia Dana M.D.   On: 11/26/2018 12:13       LOS: 8 days   [facility] Pager on [email or web address]  11/26/2018, 6:26 PM

## 2018-11-27 ENCOUNTER — Inpatient Hospital Stay (HOSPITAL_COMMUNITY): Payer: Medicare Other

## 2018-11-27 DIAGNOSIS — T797XXA Traumatic subcutaneous emphysema, initial encounter: Secondary | ICD-10-CM

## 2018-11-27 LAB — COMPREHENSIVE METABOLIC PANEL
ALT: 36 U/L (ref 0–44)
AST: 48 U/L — ABNORMAL HIGH (ref 15–41)
Albumin: 2.4 g/dL — ABNORMAL LOW (ref 3.5–5.0)
Alkaline Phosphatase: 74 U/L (ref 38–126)
Anion gap: 10 (ref 5–15)
BUN: 149 mg/dL — ABNORMAL HIGH (ref 8–23)
CO2: 32 mmol/L (ref 22–32)
Calcium: 7.8 mg/dL — ABNORMAL LOW (ref 8.9–10.3)
Chloride: 109 mmol/L (ref 98–111)
Creatinine, Ser: 2.15 mg/dL — ABNORMAL HIGH (ref 0.44–1.00)
GFR calc Af Amer: 27 mL/min — ABNORMAL LOW (ref >60)
GFR calc non Af Amer: 23 mL/min — ABNORMAL LOW (ref >60)
Glucose, Bld: 180 mg/dL — ABNORMAL HIGH (ref 70–99)
Potassium: 5.3 mmol/L — ABNORMAL HIGH (ref 3.5–5.1)
Sodium: 151 mmol/L — ABNORMAL HIGH (ref 135–145)
Total Bilirubin: 0.9 mg/dL (ref 0.3–1.2)
Total Protein: 5.2 g/dL — ABNORMAL LOW (ref 6.5–8.1)

## 2018-11-27 LAB — ABO/RH: ABO/RH(D): A POS

## 2018-11-27 LAB — CBC
HCT: 35.3 % — ABNORMAL LOW (ref 36.0–46.0)
Hemoglobin: 10.6 g/dL — ABNORMAL LOW (ref 12.0–15.0)
MCH: 27.3 pg (ref 26.0–34.0)
MCHC: 30 g/dL (ref 30.0–36.0)
MCV: 91 fL (ref 80.0–100.0)
Platelets: 139 10*3/uL — ABNORMAL LOW (ref 150–400)
RBC: 3.88 MIL/uL (ref 3.87–5.11)
RDW: 14 % (ref 11.5–15.5)
WBC: 27.5 10*3/uL — ABNORMAL HIGH (ref 4.0–10.5)
nRBC: 4.9 % — ABNORMAL HIGH (ref 0.0–0.2)

## 2018-11-27 LAB — GLUCOSE, CAPILLARY
Glucose-Capillary: 176 mg/dL — ABNORMAL HIGH (ref 70–99)
Glucose-Capillary: 182 mg/dL — ABNORMAL HIGH (ref 70–99)
Glucose-Capillary: 196 mg/dL — ABNORMAL HIGH (ref 70–99)
Glucose-Capillary: 211 mg/dL — ABNORMAL HIGH (ref 70–99)
Glucose-Capillary: 294 mg/dL — ABNORMAL HIGH (ref 70–99)
Glucose-Capillary: 302 mg/dL — ABNORMAL HIGH (ref 70–99)
Glucose-Capillary: 332 mg/dL — ABNORMAL HIGH (ref 70–99)

## 2018-11-27 LAB — MAGNESIUM: Magnesium: 3.1 mg/dL — ABNORMAL HIGH (ref 1.7–2.4)

## 2018-11-27 LAB — TYPE AND SCREEN
ABO/RH(D): A POS
Antibody Screen: NEGATIVE

## 2018-11-27 LAB — D-DIMER, QUANTITATIVE: D-Dimer, Quant: 8.05 ug/mL-FEU — ABNORMAL HIGH (ref 0.00–0.50)

## 2018-11-27 MED ORDER — AMIODARONE HCL IN DEXTROSE 360-4.14 MG/200ML-% IV SOLN
30.0000 mg/h | INTRAVENOUS | Status: DC
  Administered 2018-11-28 (×3): 30 mg/h via INTRAVENOUS
  Filled 2018-11-27 (×3): qty 200

## 2018-11-27 MED ORDER — SODIUM POLYSTYRENE SULFONATE 15 GM/60ML PO SUSP
30.0000 g | Freq: Once | ORAL | Status: AC
  Administered 2018-11-27: 12:00:00 30 g
  Filled 2018-11-27: qty 120

## 2018-11-27 MED ORDER — AMIODARONE HCL IN DEXTROSE 360-4.14 MG/200ML-% IV SOLN
60.0000 mg/h | INTRAVENOUS | Status: AC
  Administered 2018-11-27 – 2018-11-28 (×2): 60 mg/h via INTRAVENOUS
  Filled 2018-11-27 (×2): qty 200

## 2018-11-27 MED ORDER — LACTATED RINGERS IV SOLN
INTRAVENOUS | Status: AC
  Administered 2018-11-27: 19:00:00 via INTRAVENOUS

## 2018-11-27 MED ORDER — AMIODARONE LOAD VIA INFUSION
150.0000 mg | Freq: Once | INTRAVENOUS | Status: AC
  Administered 2018-11-27: 21:00:00 150 mg via INTRAVENOUS
  Filled 2018-11-27: qty 83.34

## 2018-11-27 MED ORDER — DEXTROSE 5 % IV SOLN
INTRAVENOUS | Status: DC
  Administered 2018-11-27 (×2): via INTRAVENOUS

## 2018-11-27 MED ORDER — FREE WATER
250.0000 mL | Freq: Four times a day (QID) | Status: DC
  Administered 2018-11-27 (×3): 250 mL

## 2018-11-27 NOTE — Progress Notes (Signed)
[name] [name], daughter, called... updated her on pt status and answered any questions/concerns she had... Sts she will try to call back today around noon to do a video call.   Daughter verb understanding.

## 2018-11-27 NOTE — Progress Notes (Signed)
PT Cancellation Note  Patient Details Name: [name] [name] MRN: [id number] DOB: 1950/08/26   Cancelled Treatment:    Reason Eval/Treat Not Completed: Patient not medically ready. Pt remains intubated and sedated. Will follow-up.  Mabeline Caras, PT, DPT Acute Rehabilitation Services  Pager [phone] Office [place] 11/27/2018, 11:21 AM

## 2018-11-27 NOTE — Progress Notes (Signed)
PROGRESS NOTE                                                                                                                                                                                                             Patient Demographics:    [name] [name], is a 68 y.o. female, DOB - 25-Feb-1951, ZHY:[id number]  Outpatient Primary MD for the patient is Nicholos Johns, MD    LOS - 9  Admit date - 10/29/2018    CC - SOB     Brief Narrative  68 y.o.female,with history of morbid obesity, DM type II, COPD not on oxygen, hypertension, CKD 3 baseline creatinine around 1.4, chronic atrial fibrillation [place] vas 2 score of at least 3 on Xarelto and Cardizem,spine surgeries in the past,chronic fluid retention, history of Graves' disease now hypothyroid, history of CVA, history of DVT who lives at [facility] and was likely exposed to COVID-19 infection about 10 to 14 days prior to this admission.   She was experiencing fever, body aches and developed shortness of breath.  She finally decided to come into the emergency department at [facility].  She was found to be positive for COVID-19.  Was noted to have hypoxia.  She was hospitalized for further management.   Subjective:    [name] [name] today remains intubated and sedated unable to answer questions or follow commands.   Assessment  & Plan :     1. Acute Hypoxic Resp. Failure due to Acute Covid 19 Viral Pneumonitis during the ongoing 2020 Covid 19 Pandemic - she had been sick x 2 weeks prior to admission, was intubated 11/19/2018 a day after the admission, has received IV steroids, Remdisvir along with Actemra promptly.  FiO2 requirements around 50% with a PEEP of 12.  Also has subcutaneous emphysema.  Pulmonary critical care following.  Requiring sedation and pressors for now.  Vent Mode: PCV FiO2 (%):  [50 %-60 %] 50 % Set Rate:  [30 bmp] 30 bmp PEEP:  [12 cmH20-14  cmH20] 12 cmH20 Plateau Pressure:  [22 cmH20-29 cmH20] 27 cmH20  ABG     Component Value Date/Time   PHART 7.307 (L) 11/26/2018 1554   PCO2ART 64.4 (H) 11/26/2018 1554   PO2ART 65.0 (L) 11/26/2018 1554   HCO3 31.8 (H) 11/26/2018 1554   TCO2 34 (H) 11/26/2018 1554   ACIDBASEDEF  3.0 (H) 11/21/2018 0628   O2SAT 88.0 11/26/2018 1554    COVID-19 Labs  Recent Labs    11/25/18 0515 11/26/18 0548 11/27/18 0450  DDIMER 9.59* 9.30* 8.05*  CRP 2.3*  --   --     Lab Results  Component Value Date   SARSCOV2NAA POSITIVE (A) 11/22/2018     Hepatic Function Latest Ref Rng & Units 11/27/2018 11/22/2018 11/21/2018  Total Protein 6.5 - 8.1 g/dL 5.2(L) 6.4(L) 6.4(L)  Albumin 3.5 - 5.0 g/dL 2.4(L) 2.5(L) 2.5(L)  AST 15 - 41 U/L 48(H) 27 36  ALT 0 - 44 U/L 36 33 35  Alk Phosphatase 38 - 126 U/L 74 88 91  Total Bilirubin 0.3 - 1.2 mg/dL 0.9 0.5 0.4        Component Value Date/Time   BNP 57.8 11/19/2018 0558      2.  Rising d-dimer history of DVT.  Now complicated by possible oral cavity bleeding/melena.  Anticoagulation had to be held on 11/26/2018, on IV PPI, on SCDs will monitor closely.  3.  Subcutaneous emphysema/pneumomediastinum.  Found on 11/22/2018.  No pneumothorax..  Continue to monitor.  Could have proximal bronchial leak.   4.  ARF on CKD 3 with hyperkalemia.  Baseline creatinine close to 1.3.  Currently on Lokelma, will add Kayexalate 1 dose on 11/27/2018 for hyperkalemia, hydrate with IV fluids on 11/27/2018 due to worsening AKI.  Hold further diuretics.  5.  Chronic atrial fibrillation with [place] vas 2 score of at least 3.  Complicated by episodes of V. tach this admission.  Currently on amiodarone infusion, anticoagulation held due to possible oral cavity bleeding along with melena and falling H&H.  Echo noted with a preserved EF of 60%, high-sensitivity troponin peaked at around 81 now downtrending.  Not a candidate for invasive procedures at this time or any further  intervention.  Low-dose beta-blocker once heart pressure stabilizes.  Monitor electrolytes.  6.  History of COPD.  Currently stable not on home oxygen.  7.  History of CVA.  Was on aspirin, statin and Xarelto combination.  Currently due to possibility of bleed all held.  8.  Acute blood loss anemia likely due to combination of oral mucosal bleeding/melena.  IV PPI, hold all antiplatelet and anticoagulants for now.  Type screen done.  Monitor H&H.  9.  Morbid obesity BMI of 42.  Outpatient follow-up with PCP.  10.  Diet. Feeding tube through NG tube.  11.  History of spine surgeries.  Supportive care.  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   12.  Hypothyroidism.  On Synthroid.  TSH elevated likely due to sick euthyroid syndrome.  Repeat TSH along with T3 and free T4 in 4 to 6 weeks.  13.  DM type II.  Currently on steroids on Levemir along with sliding scale.  A1c was 7.  CBG (last 3)  Recent Labs    11/27/18 0006 11/27/18 0336 11/27/18 0815  GLUCAP 196* 176* 182*     Condition - Extremely Guarded  Family Communication  :  Daughter [name] on 11/27/18 - updated on poor prognosis, no more CPR  Code Status :  No more CPR  Diet : TF  Diet Order    None       Disposition Plan  :  ICU  Consults  : PCCM  Procedures  :     ETT 7/23 L.IJ Central Line 7/23 Foley and Flexiseal Echo > LVEF >  65%, impaired relaxation, RV normal systolic function, mild thickening of aortic valve - 7/24   PUD Prophylaxis : IV PPI  DVT Prophylaxis  :  SCDs  Lab Results  Component Value Date   PLT 139 (L) 11/27/2018    Inpatient Medications  Scheduled Meds:  amiodarone  400 mg Per Tube BID   Followed by   Derrill Memo ON 11/29/2018] amiodarone  200 mg Per Tube BID   Followed by   Derrill Memo ON 12/04/2018] amiodarone  200 mg Per Tube Daily   artificial tears  1 application Both Eyes S1U   chlorhexidine gluconate (MEDLINE KIT)  15 mL  Mouth Rinse BID   dexamethasone (DECADRON) injection  4 mg Intravenous Q24H   feeding supplement (PRO-STAT SUGAR FREE 64)  30 mL Per Tube QID   feeding supplement (VITAL AF 1.2 CAL)  1,000 mL Per Tube Q24H   ferrous sulfate  300 mg Per Tube TID WC   fluticasone furoate-vilanterol  1 puff Inhalation Daily   free water  250 mL Per Tube Q6H   gabapentin  100 mg Per Tube Q8H   insulin aspart  0-20 Units Subcutaneous Q4H   insulin aspart  8 Units Subcutaneous Q4H   insulin detemir  45 Units Subcutaneous BID   levothyroxine  112 mcg Oral Q0600   mouth rinse  15 mL Mouth Rinse 10 times per day   multivitamin with minerals  1 tablet Per Tube Daily   pantoprazole sodium  40 mg Per Tube BID   polyethylene glycol  17 g Per Tube Daily   sodium zirconium cyclosilicate  10 g Oral Daily   vitamin C  500 mg Oral Daily   zinc sulfate  220 mg Oral Daily   Continuous Infusions:  sodium chloride Stopped (11/23/18 0159)   dextrose 110 mL/hr at 11/27/18 1000   fentaNYL infusion INTRAVENOUS 175 mcg/hr (11/27/18 1000)   midazolam 1 mg/hr (11/27/18 1000)   phenylephrine 85 mcg/min (11/27/18 1000)   PRN Meds:.sodium chloride, acetaminophen **OR** acetaminophen, albuterol, fentaNYL, metoprolol tartrate, midazolam, sennosides, sodium phosphate, traZODone  Antibiotics  :    Anti-infectives (From admission, onward)   Start     Dose/Rate Route Frequency Ordered Stop   11/19/18 2030  remdesivir 100 mg in sodium chloride 0.9 % 250 mL IVPB     100 mg 500 mL/hr over 30 Minutes Intravenous Every 24 hours 11/14/2018 1855 11/22/18 2204   11/07/2018 2030  remdesivir 200 mg in sodium chloride 0.9 % 250 mL IVPB     200 mg 500 mL/hr over 30 Minutes Intravenous Once 11/03/2018 1855 11/14/2018 2056       Time Spent in minutes  30   Lala Lund M.D on 11/27/2018 at 10:48 AM  To page go to [email or web address] - password [email or web address]  [facility] -  Office  [phone]    See all Orders from today  for further details    Objective:   Vitals:   11/27/18 0747 11/27/18 0800 11/27/18 0900 11/27/18 1000  BP: (!) 118/57 (!) 107/59 113/61 (!) 110/58  Pulse: 97 97 98 95  Resp: (!) 30 (!) 32 (!) 27 (!) 25  Temp:  100.2 F (37.9 C)    TempSrc:  Oral    SpO2: 94% 94% 95% 95%  Weight:      Height:        Wt Readings from Last 3 Encounters:  11/27/18 115.3 kg  04/17/18 117 kg  03/02/18 117 kg     Intake/Output Summary (Last 24  hours) at 11/27/2018 1048 Last data filed at 11/27/2018 1000 Gross per 24 hour  Intake 3703.32 ml  Output 1950 ml  Net 1753.32 ml     Physical Exam  Patient intubated sedated, ET tube in place, NG tube in place, left IJ central line in place, also has Foley catheter and Flexi-[place].AT,PERRAL Supple Neck,No JVD, No cervical lymphadenopathy appriciated.  Symmetrical Chest wall movement, Good air movement bilaterally, CTAB RRR,No Gallops,Rubs or new Murmurs, No Parasternal Heave +ve B.Sounds, Abd Soft, No tenderness, No organomegaly appriciated, No rebound - guarding or rigidity. No Cyanosis, Clubbing or edema, No new Rash or bruise       Data Review:    CBC Recent Labs  Lab 11/21/18 0410  11/22/18 0550  11/24/18 0505 11/25/18 0515 11/26/18 0548 11/26/18 1525 11/26/18 1554 11/27/18 0450  WBC 16.6*  --  17.4*   < > 19.8* 19.6* 20.4* 28.3*  --  27.5*  HGB 12.7   < > 12.9   < > 12.3 11.1* 11.5* 11.4* 12.2 10.6*  HCT 41.1   < > 42.8   < > 41.7 37.3 39.6 37.9 36.0 35.3*  PLT 179  --  179   < > 177 144* 139* 149*  --  139*  MCV 89.3  --  88.4   < > 89.9 91.0 92.3 91.1  --  91.0  MCH 27.6  --  26.7   < > 26.5 27.1 26.8 27.4  --  27.3  MCHC 30.9  --  30.1   < > 29.5* 29.8* 29.0* 30.1  --  30.0  RDW 13.2  --  13.0   < > 13.4 13.7 13.7 13.8  --  14.0  LYMPHSABS 0.8  --  0.5*  --   --   --   --   --   --   --   MONOABS 0.6  --  0.7  --   --   --   --   --   --   --   EOSABS 0.0  --  0.0  --   --   --   --   --   --   --   BASOSABS 0.0  --  0.0   --   --   --   --   --   --   --    < > = values in this interval not displayed.    Chemistries  Recent Labs  Lab 11/20/18 1611  11/21/18 0410  11/21/18 1630 11/22/18 0550  11/24/18 0505 11/25/18 0515 11/26/18 0548 11/26/18 1525 11/26/18 1554 11/27/18 0450  NA  --    < > 138   < >  --  142   < > 145 146* 149* 149* 147* 151*  K  --    < > 5.0   < >  --  4.8   < > 5.2* 5.5* 5.4* 5.8* 5.5* 5.3*  CL  --    < > 105  --   --  105   < > 109 110 108 108  --  109  CO2  --    < > 24  --   --  28   < > 29 30 33* 30  --  32  GLUCOSE  --    < > 322*  --   --  287*   < > 325* 278* 299* 227*  --  180*  BUN  --    < > 49*  --   --  57*   < > 76* 97* 130* 150*  --  149*  CREATININE  --    < > 1.57*  --   --  1.51*   < > 1.35* 1.52* 1.79* 2.10*  --  2.15*  CALCIUM  --    < > 8.4*  --   --  8.5*   < > 8.2* 7.8* 7.8* 7.9*  --  7.8*  MG 2.2  --  2.2  --  2.3  --   --   --   --   --  3.0*  --  3.1*  AST  --   --  36  --   --  27  --   --   --   --   --   --  48*  ALT  --   --  35  --   --  33  --   --   --   --   --   --  36  ALKPHOS  --   --  91  --   --  88  --   --   --   --   --   --  74  BILITOT  --   --  0.4  --   --  0.5  --   --   --   --   --   --  0.9   < > = values in this interval not displayed.   ------------------------------------------------------------------------------------------------------------------ No results for input(s): CHOL, HDL, LDLCALC, TRIG, CHOLHDL, LDLDIRECT in the last 72 hours.  Lab Results  Component Value Date   HGBA1C 7.0 (H) 11/05/2018   ------------------------------------------------------------------------------------------------------------------ No results for input(s): TSH, T4TOTAL, T3FREE, THYROIDAB in the last 72 hours.  Invalid input(s): FREET3  Cardiac Enzymes No results for input(s): CKMB, TROPONINI, MYOGLOBIN in the last 168 hours.  Invalid input(s):  CK ------------------------------------------------------------------------------------------------------------------    Component Value Date/Time   BNP 57.8 11/19/2018 0558    Micro Results Recent Results (from the past 240 hour(s))  SARS Coronavirus 2 (CEPHEID- Performed in [facility] hospital lab), Hosp Order     Status: Abnormal   Collection Time: 11/07/2018  8:29 PM   Specimen: Nasopharyngeal Swab  Result Value Ref Range Status   SARS Coronavirus 2 POSITIVE (A) NEGATIVE Final    Comment: CRITICAL RESULT CALLED TO, READ BACK BY AND VERIFIED WITH: RN P HOWARD AT 0106 11/19/18 CRUICKSHANK A (NOTE) If result is NEGATIVE SARS-CoV-2 target nucleic acids are NOT DETECTED. The SARS-CoV-2 RNA is generally detectable in upper and lower  respiratory specimens during the acute phase of infection. The lowest  concentration of SARS-CoV-2 viral copies this assay can detect is 250  copies / mL. A negative result does not preclude SARS-CoV-2 infection  and should not be used as the sole basis for treatment or other  patient management decisions.  A negative result may occur with  improper specimen collection / handling, submission of specimen other  than nasopharyngeal swab, presence of viral mutation(s) within the  areas targeted by this assay, and inadequate number of viral copies  (<250 copies / mL). A negative result must be combined with clinical  observations, patient history, and epidemiological information. If result is POSITIVE SARS-CoV-2 target nucleic acids a re DETECTED. The SARS-CoV-2 RNA is generally detectable in upper and lower  respiratory specimens during the acute phase of infection.  Positive  results are indicative of active infection with SARS-CoV-2.  Clinical  correlation with patient history  and other diagnostic information is  necessary to determine patient infection status.  Positive results do  not rule out bacterial infection or co-infection with other viruses. If  result is PRESUMPTIVE POSTIVE SARS-CoV-2 nucleic acids MAY BE PRESENT.   A presumptive positive result was obtained on the submitted specimen  and confirmed on repeat testing.  While 2019 novel coronavirus  (SARS-CoV-2) nucleic acids may be present in the submitted sample  additional confirmatory testing may be necessary for epidemiological  and / or clinical management purposes  to differentiate between  SARS-CoV-2 and other Sarbecovirus currently known to infect humans.  If clinically indicated additional testing with an alternate test  methodolo gy [phone]) is advised. The SARS-CoV-2 RNA is generally  detectable in upper and lower respiratory specimens during the acute  phase of infection. The expected result is Negative. Fact Sheet for Patients:  StrictlyIdeas.no Fact Sheet for Healthcare Providers: BankingDealers.co.za This test is not yet approved or cleared by the [place] FDA and has been authorized for detection and/or diagnosis of SARS-CoV-2 by FDA under an Emergency Use Authorization (EUA).  This EUA will remain in effect (meaning this test can be used) for the duration of the COVID-19 declaration under Section 564(b)(1) of the Act, 21 U.S.C. section 360bbb-3(b)(1), unless the authorization is terminated or revoked sooner. Performed at [facility], [place] [address], [place], [place] [address]   MRSA PCR Screening     Status: None   Collection Time: 11/15/2018 11:15 PM   Specimen: Nasal Mucosa; Nasopharyngeal  Result Value Ref Range Status   MRSA by PCR NEGATIVE NEGATIVE Final    Comment:        The GeneXpert MRSA Assay (FDA approved for NASAL specimens only), is one component of a comprehensive MRSA colonization surveillance program. It is not intended to diagnose MRSA infection nor to guide or monitor treatment for MRSA infections. Performed at [facility], [place] [address], [place], [place] [address]     Radiology Reports Dg Abd 1 View  Result Date: 11/19/2018 CLINICAL DATA:  Check gastric catheter EXAM: ABDOMEN - 1 VIEW COMPARISON:  None. FINDINGS: Gastric catheter is noted within the distal aspect of the stomach. Scattered large and small bowel gas is noted. No other focal abnormality is seen. IMPRESSION: Gastric catheter within the distal stomach. Electronically Signed   By: Inez Catalina M.D.   On: 11/19/2018 19:44   Dg Chest Port 1 View  Result Date: 11/27/2018 CLINICAL DATA:  68 year old female with a history respiratory failure EXAM: PORTABLE CHEST 1 VIEW COMPARISON:  Multiple prior comparison most recent November 26, 2018, November 25, 2018 November 24, 2018 FINDINGS: Cardiomediastinal silhouette unchanged in size and contour. Endotracheal tube terminates 3.9 cm above the carina. Left IJ venous catheter appears to terminate at the confluence of the SVC and the left brachiocephalic vein. Enteric feeding tube projects over the mediastinum and terminates out of the field of view. Similar appearance of elevation of the right hemidiaphragm. Airspace opacity at the left lung base similar to the comparison. No pneumothorax. No large pleural effusion. More mild airspace opacity on the right. IMPRESSION: Similar appearance of the lungs, with left greater than right airspace disease persisting. Endotracheal tube terminates 3.9 cm above the carina. Unchanged left IJ central venous catheter and enteric feeding tube. Similar appearance of elevation of the right hemidiaphragm. Electronically Signed   By: Corrie Mckusick D.O.   On: 11/27/2018 08:47   Dg Chest Port 1 View  Result Date:  11/26/2018 CLINICAL DATA:  COVID-19, intubation EXAM: PORTABLE CHEST 1 VIEW COMPARISON:  Portable exam 0903 hours compared to 11/25/2018 FINDINGS: Tip of endotracheal tube projects 3.2 cm above carina. Feeding tube extends into abdomen with tip projecting over duodenal bulb. Numerous EKG leads project over  chest. Stable heart size and mediastinal contours. Survey aorta Scattered airspace infiltrates in the mid to lower lungs consistent with pneumonia. Bones demineralized. No pneumothorax. IMPRESSION: BILATERAL pulmonary infiltrates consistent with pneumonia. Electronically Signed   By: Lavonia Dana M.D.   On: 11/26/2018 12:13   Dg Chest Port 1 View  Result Date: 11/25/2018 CLINICAL DATA:  COVID-19 pneumonia EXAM: PORTABLE CHEST 1 VIEW COMPARISON:  Multiple prior chest films. FINDINGS: The endotracheal tube is 2.7 cm above the carina. The NG tube is coursing down the esophagus and into the stomach. The left IJ central venous catheter is stable with its tip near the brachiocephalic SVC junction. The heart is mildly enlarged but stable. Stable tortuosity and calcification of the thoracic aorta. Persistent bilateral infiltrates, left greater than right. No definite pleural effusions. Stable fairly marked elevation of the right hemidiaphragm. IMPRESSION: 1. Stable support apparatus as detailed above. 2. Stable cardiac enlargement. 3. Persistent bilateral infiltrates. Electronically Signed   By: Marijo Sanes M.D.   On: 11/25/2018 09:04   Dg Chest Port 1 View  Result Date: 11/24/2018 CLINICAL DATA:  Respiratory failure EXAM: PORTABLE CHEST 1 VIEW COMPARISON:  11/23/2018, 11/22/2018, 11/21/2018, 11/19/2018 FINDINGS: Endotracheal tube tip is about 2 cm superior to the carina. Esophageal tube tip is below the diaphragm but non included. Left IJ central venous catheter tip projects over the upper mediastinum. Elevation of right diaphragm. Overall no significant change in left greater than right pulmonary infiltrates. Aortic atherosclerosis. No pneumothorax. Moderate subcutaneous emphysema over the supraclavicular fossa and the chest wall on the right. IMPRESSION: 1. Support lines and tubes as above 2. Overall no significant change in left greater than right pulmonary infiltrate since the prior exam 3. Continued moderate  subcutaneous emphysema over the chest walls and supraclavicular fossa Electronically Signed   By: Donavan Foil M.D.   On: 11/24/2018 02:48   Dg Chest Port 1 View  Result Date: 11/23/2018 CLINICAL DATA:  Change in heart rate and blood pressure. EXAM: PORTABLE CHEST 1 VIEW COMPARISON:  Chest x-rays dated 11/23/2018 and 11/22/2018. FINDINGS: Support apparatus appears stable in position. Persistent perihilar and bibasilar opacities, likely a combination of pulmonary edema and bibasilar atelectasis. No pleural effusion or pneumothorax seen. Subcutaneous emphysema again noted at the level of the upper chest and lower neck. IMPRESSION: Stable chest x-ray. Persistent perihilar and bibasilar opacities, likely a combination of pulmonary edema and bibasilar atelectasis. Persistent subcutaneous emphysema within the upper chest and lower neck. No underlying pneumothorax seen. Electronically Signed   By: Franki Cabot M.D.   On: 11/23/2018 19:14   Dg Chest Port 1 View  Result Date: 11/23/2018 CLINICAL DATA:  Subcutaneous emphysema. EXAM: PORTABLE CHEST 1 VIEW COMPARISON:  11/22/2018. FINDINGS: Endotracheal tube, left IJ line, NG tube in stable position. Heart size stable. Bilateral pulmonary infiltrates unchanged. Mild basilar atelectasis. No pleural effusion or pneumothorax. Diffuse bilateral neck and right lateral chest wall subcutaneous emphysema again noted. No interim change. IMPRESSION: 1.  Lines and tubes in stable position. 2.  Bilateral pulmonary infiltrates unchanged. 3. Bilateral neck and right lateral chest wall subcutaneous emphysema again noted without interim change. No pneumothorax noted. Chest is unchanged from prior exam. Electronically Signed   By: Marcello Moores  Register  On: 11/23/2018 06:53   Dg Chest Port 1 View  Result Date: 11/22/2018 CLINICAL DATA:  Subcutaneous emphysema, COVID positive patient EXAM: PORTABLE CHEST 1 VIEW COMPARISON:  11/21/2018 FINDINGS: Endotracheal tube terminates 3 cm above  the carina. Left IJ venous catheter terminates at the junction of the distal left brachiocephalic vein and SVC. Enteric tube courses of the stomach. Mild patchy bilateral pulmonary opacities in the right perihilar lung and left upper and lower lobes. No pleural effusion. No pneumothorax is seen. Subcutaneous emphysema in the bilateral neck and right lateral chest wall/axilla, new. IMPRESSION: New subcutaneous emphysema in the bilateral neck and right lateral chest wall/axilla. No pneumothorax is seen. Mild patchy opacities in the lungs bilaterally, left greater than right, in this patient with known COVID. Endotracheal tube terminates 3 cm above the carina. Additional support apparatus as above. Electronically Signed   By: Julian Hy M.D.   On: 11/22/2018 22:57   Dg Chest Port 1 View  Result Date: 11/21/2018 CLINICAL DATA:  Pneumonia due to COVID-19 EXAM: PORTABLE CHEST 1 VIEW COMPARISON:  Portable exam 1614 hours compared to 11/19/2018 FINDINGS: Tip of endotracheal tube projects 2.8 cm above carina. Nasogastric tube extends into stomach. LEFT jugular central venous catheter with tip projecting over SVC. Upper normal heart size. Atherosclerotic calcification aorta. Elevation of RIGHT diaphragm. Patchy airspace infiltrates bilaterally consistent with pneumonia and history of COVID-19. No pleural effusion or pneumothorax. Bones unremarkable. IMPRESSION: Persistent BILATERAL pulmonary infiltrates consistent with pneumonia. Electronically Signed   By: Lavonia Dana M.D.   On: 11/21/2018 18:41   Dg Chest Port 1 View  Result Date: 11/19/2018 CLINICAL DATA:  Endotracheal tube placement and central line placement. EXAM: PORTABLE CHEST 1 VIEW COMPARISON:  Chest radiograph performed 11/12/2018 FINDINGS: The patient's endotracheal tube is seen ending 2 cm above the carina. An enteric tube is noted extending overlying the body of the stomach. A left IJ line is noted ending about the proximal SVC. The lungs remain  hypoexpanded. Patchy bilateral perihilar and left basilar airspace opacification is concerning for pneumonia. No definite pleural effusion or pneumothorax is seen. The cardiomediastinal silhouette is borderline enlarged. No acute osseous abnormalities are seen. IMPRESSION: 1. Endotracheal tube seen ending 2 cm above the carina. 2. Enteric tube noted extending overlying the body of the stomach. 3. Left IJ line ending about the proximal SVC. 4. Lungs hypoexpanded. Patchy bilateral perihilar and left basilar airspace opacification are concerning for pneumonia. 5. Borderline cardiomegaly. Electronically Signed   By: Garald Balding M.D.   On: 11/19/2018 19:44   Dg Chest Port 1 View  Result Date: 11/01/2018 CLINICAL DATA:  COVID-19 positive, shortness of breath EXAM: PORTABLE CHEST 1 VIEW COMPARISON:  Portable exam 1935 hours compared to 11/17/2018 FINDINGS: Based on multiple prior exams, current study is mislabeled LEFT versus RIGHT and is re-labeled. Chronic elevation of RIGHT diaphragm. Normal heart size and mediastinal contours. Patchy infiltrates LEFT lung. No pleural effusion or pneumothorax. IMPRESSION: Patchy LEFT lung infiltrates. Chronic elevation of RIGHT diaphragm. Electronically Signed   By: Lavonia Dana M.D.   On: 11/20/2018 20:08   Dg Abd Portable 1v  Result Date: 11/26/2018 CLINICAL DATA:  COVID-19, constipation EXAM: PORTABLE ABDOMEN - 1 VIEW COMPARISON:  Portable exam 0903 hours compared to 11/19/2018 FINDINGS: Tip of feeding tube projects over duodenal bulb. Normal stool burden. Small amount retained contrast in colon. No bowel dilatation or bowel wall thickening. Bones demineralized. IMPRESSION: Nonobstructive bowel gas pattern with normal stool burden. Electronically Signed   By: Elta Guadeloupe  Thornton Papas M.D.   On: 11/26/2018 12:13

## 2018-11-27 NOTE — Plan of Care (Signed)

## 2018-11-27 NOTE — Progress Notes (Signed)
Assisted tele visit to patient with daughter.  Destenie Ingber Anderson, RN   

## 2018-11-27 NOTE — Progress Notes (Signed)
Approximately 1820 patient noted to be in SVT rate in 190s.  PRN metoprolol given and rate dropped back to SR/ST 90-100s.  Patient then into Afib RVR rhythm rate 150s.  MD paged awaiting return call.

## 2018-11-27 NOTE — Progress Notes (Addendum)
eLink Physician-Brief Progress Note Patient Name: [name] [name] DOB: 10/11/1950 MRN: [id number]   Date of Service  11/27/2018  HPI/Events of Note  AFIB with RVR  - Ventricular rate = 168. BP = 103/62. Last K+ = 5.3 and Mg++ = 3.1.   eICU Interventions  Will order: 1. Amiodarone IV load and infusion. 2. D/C Amiodarone PO.      Intervention Category Major Interventions: Arrhythmia - evaluation and management  Sommer,Steven Eugene 11/27/2018, 9:02 PM

## 2018-11-27 NOTE — Progress Notes (Signed)
NAME:  [name] [name], MRN:  [id number], DOB:  Oct 20, 1950, LOS: 9 ADMISSION DATE:  11/04/2018, CONSULTATION DATE: July 23 REFERRING MD: Dr. Candiss Norse, CHIEF COMPLAINT: Dyspnea  Brief History   68 year old female admitted for severe acute respiratory failure with hypoxemia due to COVID-19 pneumonia.  Past Medical History  Diabetes mellitus type 2 Obesity Asthma, has been on inhalers in the past, never smoked cigarettes CKD stage III Chronic A. fib History of spine surgery History of Graves' disease, now hypothyroid History of stroke History of DVT Lives in an assisted living facility [facility] Events   July 22 admitted July 23 intubated July 24 prone positioning July 26 subcutaneous air, no pneumothorax, afib with RVR again July 29 decreasing sedation, diuresis July 30 decreasing sedation July 31 holding additional diuresis  Consults:  PCCM  Procedures:  7/23 ETT > 7/23 L IJ CVL >  Significant Diagnostic Tests:  7/24 Echo > LVEF > 65%, impaired relaxation, RV normal systolic function, mild thickening of aortic valve  Micro Data:  July 22 SARS COV 2 Positive  Antimicrobials:  July 22 remdesivir> 7/26 July 22 decadron July 22> 23 Toclizumab   Interim history/subjective:   Patient remains critically ill intubated sedated on mechanical ventilation.  Objective   Blood pressure (!) 98/59, pulse (!) 101, temperature 98.1 F (36.7 C), temperature source Oral, resp. rate (!) 30, height 5\' 3"  (1.6 m), weight 115.3 kg, SpO2 94 %. CVP:  [5 mmHg-17 mmHg] 9 mmHg  Vent Mode: PCV FiO2 (%):  [50 %-60 %] 50 % Set Rate:  [30 bmp] 30 bmp PEEP:  [14 cmH20] 14 cmH20 Plateau Pressure:  [22 cmH20-29 cmH20] 27 cmH20   Intake/Output Summary (Last 24 hours) at 11/27/2018 0827 Last data filed at 11/27/2018 0700 Gross per 24 hour  Intake 3312.49 ml  Output 1625 ml  Net 1687.49 ml   Filed Weights   11/23/18 0500 11/24/18 0500 11/27/18 0415  Weight: 111.5 kg 112.2  kg 115.3 kg    Examination:  General: Patient remains intubated on mechanical life support, sedated critically ill HENT: NCAT, endotracheal tube in place PULM: Bilateral ventilated breath sounds CV: Tachycardic, regular, S1-S2 GI: Soft, nontender, nondistended, bowel sounds present MSK: No significant dependent edema in the upper and lower extremities Neuro: Sedated on mechanical ventilation, will withdraw to pain Derm: Crepitus improved on anterior neck  Chest x-ray 11/27/2018: Persistent right-sided infiltrates. The patient's images have been independently reviewed by me.    Resolved Hospital Problem list     Assessment & Plan:   Severe acute respiratory failure with hypoxemia secondary to ARDS from COVID pneumonia requiring intubation and mechanical ventilation. Continue mechanical ventilation per ARDS protocol Target TVol 6-8cc/kgIBW Target Plateau Pressure < 30cm H20 Target driving pressure less than 15 cm of water Target PaO2 55-65: titrate PEEP/FiO2 per protocol As long as PaO2 to FiO2 ratio is less than 1:150 position in prone position for 16 hours a day Check CVP daily if CVL in place Target CVP less than 4, diurese as necessary Ventilator associated pneumonia prevention protocol  11/27/2018: Continue Decadron, completed antiviral remdesivir and anti-inflammatory tocilizumab Holding on any additional dosing of Lasix at this time.  Pneumomediastinum Continue to observe clinically Stable chest x-ray  VT 7/23 PM: has not recurred, unclear cause; electrolytes OK, Echo OK Atrial fibrillation with RVR Patient remains on telemetry Continue scheduled metoprolol 25 mg every 8 hours Continue oral amiodarone PRN metoprolol 5 mg IV for any rapid ventricular rates.  Need for sedation  for ventilator dyssynchrony: Slowly beginning to decrease sedation needs. We will continue to wean as tolerated maintain goal RA SS approximately -2.  History of DVT Anticoagulation held due  to hemoglobin that dropped from 12-10, black stools Continue to observe there is been no additional bleeding at this time Continue on PPI  Hyperkalemia Lokelma, per TRH  Hypernatremia Hold additional diuresis today Replace with free water.   Black stools overnight, elevated BUN Anticoagulation held Continue observing hemoglobin Continue on PPI   Best practice:  Diet: start tube feeding Pain/Anxiety/Delirium protocol (if indicated): Sedation with fentanyl and Versed VAP protocol (if indicated): yes DVT prophylaxis: heparin infusion GI prophylaxis: protonix Glucose control: per TRH Mobility: bed rest Code Status: full Family Communication: Per TRH Disposition: remain in ICU  Labs   CBC: Recent Labs  Lab 11/21/18 0410  11/22/18 0550  11/24/18 0505 11/25/18 0515 11/26/18 0548 11/26/18 1525 11/26/18 1554 11/27/18 0450  WBC 16.6*  --  17.4*   < > 19.8* 19.6* 20.4* 28.3*  --  27.5*  NEUTROABS 14.8*  --  15.7*  --   --   --   --   --   --   --   HGB 12.7   < > 12.9   < > 12.3 11.1* 11.5* 11.4* 12.2 10.6*  HCT 41.1   < > 42.8   < > 41.7 37.3 39.6 37.9 36.0 35.3*  MCV 89.3  --  88.4   < > 89.9 91.0 92.3 91.1  --  91.0  PLT 179  --  179   < > 177 144* 139* 149*  --  139*   < > = values in this interval not displayed.    Basic Metabolic Panel: Recent Labs  Lab 11/20/18 1611  11/21/18 0410  11/21/18 1630  11/24/18 0505 11/25/18 0515 11/26/18 0548 11/26/18 1525 11/26/18 1554 11/27/18 0450  NA  --    < > 138   < >  --    < > 145 146* 149* 149* 147* 151*  K  --    < > 5.0   < >  --    < > 5.2* 5.5* 5.4* 5.8* 5.5* 5.3*  CL  --   --  105  --   --    < > 109 110 108 108  --  109  CO2  --   --  24  --   --    < > 29 30 33* 30  --  32  GLUCOSE  --   --  322*  --   --    < > 325* 278* 299* 227*  --  180*  BUN  --   --  49*  --   --    < > 76* 97* 130* 150*  --  149*  CREATININE  --   --  1.57*  --   --    < > 1.35* 1.52* 1.79* 2.10*  --  2.15*  CALCIUM  --   --  8.4*   --   --    < > 8.2* 7.8* 7.8* 7.9*  --  7.8*  MG 2.2  --  2.2  --  2.3  --   --   --   --  3.0*  --  3.1*  PHOS 5.0*  --  3.9  --  4.2  --   --   --   --   --   --   --    < > =  values in this interval not displayed.   GFR: Estimated Creatinine Clearance: 30.7 mL/min (A) (by C-G formula based on SCr of 2.15 mg/dL (H)). Recent Labs  Lab 11/25/18 0515 11/26/18 0548 11/26/18 1525 11/27/18 0450  WBC 19.6* 20.4* 28.3* 27.5*    Liver Function Tests: Recent Labs  Lab 11/21/18 0410 11/22/18 0550 11/27/18 0450  AST 36 27 48*  ALT 35 33 36  ALKPHOS 91 88 74  BILITOT 0.4 0.5 0.9  PROT 6.4* 6.4* 5.2*  ALBUMIN 2.5* 2.5* 2.4*   No results for input(s): LIPASE, AMYLASE in the last 168 hours. No results for input(s): AMMONIA in the last 168 hours.  ABG    Component Value Date/Time   PHART 7.307 (L) 11/26/2018 1554   PCO2ART 64.4 (H) 11/26/2018 1554   PO2ART 65.0 (L) 11/26/2018 1554   HCO3 31.8 (H) 11/26/2018 1554   TCO2 34 (H) 11/26/2018 1554   ACIDBASEDEF 3.0 (H) 11/21/2018 0628   O2SAT 88.0 11/26/2018 1554     Coagulation Profile: No results for input(s): INR, PROTIME in the last 168 hours.  Cardiac Enzymes: No results for input(s): CKTOTAL, CKMB, CKMBINDEX, TROPONINI in the last 168 hours.  HbA1C: Hgb A1c MFr Bld  Date/Time Value Ref Range Status  11/26/2018 08:00 PM 7.0 (H) 4.8 - 5.6 % Final    Comment:    (NOTE) Pre diabetes:          5.7%-6.4% Diabetes:              >6.4% Glycemic control for   <7.0% adults with diabetes   10/18/2016 10:47 AM 6.0 (H) 4.8 - 5.6 % Final    Comment:    (NOTE)         Pre-diabetes: 5.7 - 6.4         Diabetes: >6.4         Glycemic control for adults with diabetes: <7.0     CBG: Recent Labs  Lab 11/26/18 1952 11/26/18 2323 11/27/18 0006 11/27/18 0336 11/27/18 0815  GLUCAP 200* 204* 196* 176* 182*     This patient is critically ill with multiple organ system failure; which, requires frequent high complexity decision  making, assessment, support, evaluation, and titration of therapies. This was completed through the application of advanced monitoring technologies and extensive interpretation of multiple databases. During this encounter critical care time was devoted to patient care services described in this note for 33 minutes.  Garner Nash, DO [facility] 11/27/2018 8:27 AM  Personal pager: [phone] If unanswered, please page CCM On-call: [phone]

## 2018-11-28 LAB — FERRITIN: Ferritin: 548 ng/mL — ABNORMAL HIGH (ref 11–307)

## 2018-11-28 LAB — CBC WITH DIFFERENTIAL/PLATELET
Abs Immature Granulocytes: 1.18 10*3/uL — ABNORMAL HIGH (ref 0.00–0.07)
Basophils Absolute: 0.1 10*3/uL (ref 0.0–0.1)
Basophils Relative: 0 %
Eosinophils Absolute: 0 10*3/uL (ref 0.0–0.5)
Eosinophils Relative: 0 %
HCT: 31.8 % — ABNORMAL LOW (ref 36.0–46.0)
Hemoglobin: 9.6 g/dL — ABNORMAL LOW (ref 12.0–15.0)
Immature Granulocytes: 5 %
Lymphocytes Relative: 5 %
Lymphs Abs: 1.2 10*3/uL (ref 0.7–4.0)
MCH: 27.3 pg (ref 26.0–34.0)
MCHC: 30.2 g/dL (ref 30.0–36.0)
MCV: 90.3 fL (ref 80.0–100.0)
Monocytes Absolute: 1.1 10*3/uL — ABNORMAL HIGH (ref 0.1–1.0)
Monocytes Relative: 5 %
Neutro Abs: 19.9 10*3/uL — ABNORMAL HIGH (ref 1.7–7.7)
Neutrophils Relative %: 85 %
Platelets: 122 10*3/uL — ABNORMAL LOW (ref 150–400)
RBC: 3.52 MIL/uL — ABNORMAL LOW (ref 3.87–5.11)
RDW: 13.9 % (ref 11.5–15.5)
WBC: 23.6 10*3/uL — ABNORMAL HIGH (ref 4.0–10.5)

## 2018-11-28 LAB — BASIC METABOLIC PANEL
Anion gap: 13 (ref 5–15)
BUN: 95 mg/dL — ABNORMAL HIGH (ref 8–23)
CO2: 31 mmol/L (ref 22–32)
Calcium: 7.2 mg/dL — ABNORMAL LOW (ref 8.9–10.3)
Chloride: 101 mmol/L (ref 98–111)
Creatinine, Ser: 1.23 mg/dL — ABNORMAL HIGH (ref 0.44–1.00)
GFR calc Af Amer: 52 mL/min — ABNORMAL LOW (ref >60)
GFR calc non Af Amer: 45 mL/min — ABNORMAL LOW (ref >60)
Glucose, Bld: 327 mg/dL — ABNORMAL HIGH (ref 70–99)
Potassium: 4.3 mmol/L (ref 3.5–5.1)
Sodium: 145 mmol/L (ref 135–145)

## 2018-11-28 LAB — COMPREHENSIVE METABOLIC PANEL
ALT: 37 U/L (ref 0–44)
AST: 57 U/L — ABNORMAL HIGH (ref 15–41)
Albumin: 2.3 g/dL — ABNORMAL LOW (ref 3.5–5.0)
Alkaline Phosphatase: 77 U/L (ref 38–126)
Anion gap: 8 (ref 5–15)
BUN: 114 mg/dL — ABNORMAL HIGH (ref 8–23)
CO2: 34 mmol/L — ABNORMAL HIGH (ref 22–32)
Calcium: 7.6 mg/dL — ABNORMAL LOW (ref 8.9–10.3)
Chloride: 106 mmol/L (ref 98–111)
Creatinine, Ser: 1.44 mg/dL — ABNORMAL HIGH (ref 0.44–1.00)
GFR calc Af Amer: 43 mL/min — ABNORMAL LOW (ref >60)
GFR calc non Af Amer: 37 mL/min — ABNORMAL LOW (ref >60)
Glucose, Bld: 288 mg/dL — ABNORMAL HIGH (ref 70–99)
Potassium: 4.4 mmol/L (ref 3.5–5.1)
Sodium: 148 mmol/L — ABNORMAL HIGH (ref 135–145)
Total Bilirubin: 0.7 mg/dL (ref 0.3–1.2)
Total Protein: 4.9 g/dL — ABNORMAL LOW (ref 6.5–8.1)

## 2018-11-28 LAB — GLUCOSE, CAPILLARY
Glucose-Capillary: 187 mg/dL — ABNORMAL HIGH (ref 70–99)
Glucose-Capillary: 196 mg/dL — ABNORMAL HIGH (ref 70–99)
Glucose-Capillary: 213 mg/dL — ABNORMAL HIGH (ref 70–99)
Glucose-Capillary: 223 mg/dL — ABNORMAL HIGH (ref 70–99)
Glucose-Capillary: 253 mg/dL — ABNORMAL HIGH (ref 70–99)
Glucose-Capillary: 298 mg/dL — ABNORMAL HIGH (ref 70–99)

## 2018-11-28 LAB — C-REACTIVE PROTEIN: CRP: 0.8 mg/dL (ref <1.0)

## 2018-11-28 LAB — LACTATE DEHYDROGENASE: LDH: 757 U/L — ABNORMAL HIGH (ref 98–192)

## 2018-11-28 LAB — MAGNESIUM: Magnesium: 2.7 mg/dL — ABNORMAL HIGH (ref 1.7–2.4)

## 2018-11-28 LAB — APTT: aPTT: 22 seconds — ABNORMAL LOW (ref 24–36)

## 2018-11-28 LAB — D-DIMER, QUANTITATIVE: D-Dimer, Quant: 13.93 ug/mL-FEU — ABNORMAL HIGH (ref 0.00–0.50)

## 2018-11-28 LAB — BRAIN NATRIURETIC PEPTIDE: B Natriuretic Peptide: 255.7 pg/mL — ABNORMAL HIGH (ref 0.0–100.0)

## 2018-11-28 LAB — HEPARIN LEVEL (UNFRACTIONATED)
Heparin Unfractionated: <0.1 IU/mL — ABNORMAL LOW (ref 0.30–0.70)
Heparin Unfractionated: 0.25 IU/mL — ABNORMAL LOW (ref 0.30–0.70)

## 2018-11-28 MED ORDER — FENTANYL CITRATE (PF) 100 MCG/2ML IJ SOLN: 50.0000 ug | Freq: Once | INTRAMUSCULAR | Status: DC

## 2018-11-28 MED ORDER — FENTANYL 2500MCG IN NS 250ML (10MCG/ML) PREMIX INFUSION
50.0000 ug/h | INTRAVENOUS | Status: DC
  Administered 2018-11-28: 17:00:00 175 ug/h via INTRAVENOUS
  Administered 2018-11-29: 100 ug/h via INTRAVENOUS
  Administered 2018-11-30: 13:00:00 150 ug/h via INTRAVENOUS
  Administered 2018-12-01 – 2018-12-02 (×2): 100 ug/h via INTRAVENOUS
  Administered 2018-12-03 – 2018-12-04 (×2): 150 ug/h via INTRAVENOUS
  Administered 2018-12-04: 200 ug/h via INTRAVENOUS
  Administered 2018-12-05: 75 ug/h via INTRAVENOUS
  Administered 2018-12-07: 50 ug/h via INTRAVENOUS
  Administered 2018-12-08: 16:00:00 150 ug/h via INTRAVENOUS
  Administered 2018-12-09: 06:00:00 200 ug/h via INTRAVENOUS
  Filled 2018-11-28 (×13): qty 250

## 2018-11-28 MED ORDER — MIDAZOLAM BOLUS VIA INFUSION
1.0000 mg | INTRAVENOUS | Status: DC | PRN
  Filled 2018-11-28: qty 2

## 2018-11-28 MED ORDER — HEPARIN (PORCINE) 25000 UT/250ML-% IV SOLN
700.0000 [IU]/h | INTRAVENOUS | Status: DC
  Administered 2018-11-28: 700 [IU]/h via INTRAVENOUS
  Filled 2018-11-28: qty 250

## 2018-11-28 MED ORDER — FREE WATER
300.0000 mL | Freq: Four times a day (QID) | Status: DC
  Administered 2018-11-28 – 2018-12-08 (×37): 300 mL

## 2018-11-28 MED ORDER — HEPARIN (PORCINE) 25000 UT/250ML-% IV SOLN
750.0000 [IU]/h | INTRAVENOUS | Status: DC
  Administered 2018-11-29 – 2018-12-01 (×2): 800 [IU]/h via INTRAVENOUS
  Filled 2018-11-28 (×2): qty 250

## 2018-11-28 MED ORDER — MIDAZOLAM 50MG/50ML (1MG/ML) PREMIX INFUSION
0.0000 mg/h | INTRAVENOUS | Status: DC
  Administered 2018-11-29: 2 mg/h via INTRAVENOUS
  Administered 2018-11-29: 1 mg/h via INTRAVENOUS
  Administered 2018-11-30: 3 mg/h via INTRAVENOUS
  Administered 2018-12-01: 2 mg/h via INTRAVENOUS
  Filled 2018-11-28 (×4): qty 50

## 2018-11-28 MED ORDER — FENTANYL BOLUS VIA INFUSION
50.0000 ug | INTRAVENOUS | Status: DC | PRN
  Administered 2018-11-29 – 2018-12-09 (×16): 50 ug via INTRAVENOUS
  Filled 2018-11-28: qty 50

## 2018-11-28 NOTE — Progress Notes (Signed)
ANTICOAGULATION CONSULT NOTE - Follow Up Consult  Pharmacy Consult for Heparin Indication: atrial fibrillation  (Hx DVT on PTA Xarelto)  Allergies  Allergen Reactions  . Sulfonamide Derivatives Itching    Patient Measurements: Height: _0  (160 cm) Weight: 255 lb 11.7 oz (116 kg) IBW/kg (Calculated) : 52.4 Heparin Dosing Weight: 80.5 kg  Vital Signs: Temp: 98 F (36.7 C) (08/01 0800) Temp Source: Oral (08/01 0800) BP: 131/66 (08/01 0900) Pulse Rate: 78 (08/01 0900)  Labs: Recent Labs    11/26/18 1525 11/26/18 1554 11/27/18 0450 11/28/18 0430  HGB 11.4* 12.2 10.6* 9.6*  HCT 37.9 36.0 35.3* 31.8*  PLT 149*  --  139* 122*  CREATININE 2.10*  --  2.15* 1.44*    Estimated Creatinine Clearance: 45.9 mL/min (A) (by C-G formula based on SCr of 1.44 mg/dL (H)).   Medications:  Scheduled:  . artificial tears  1 application Both Eyes T2P  . chlorhexidine gluconate (MEDLINE KIT)  15 mL Mouth Rinse BID  . dexamethasone (DECADRON) injection  4 mg Intravenous Q24H  . feeding supplement (PRO-STAT SUGAR FREE 64)  30 mL Per Tube QID  . feeding supplement (VITAL AF 1.2 CAL)  1,000 mL Per Tube Q24H  . ferrous sulfate  300 mg Per Tube TID WC  . fluticasone furoate-vilanterol  1 puff Inhalation Daily  . free water  300 mL Per Tube Q6H  . gabapentin  100 mg Per Tube Q8H  . insulin aspart  0-20 Units Subcutaneous Q4H  . insulin aspart  8 Units Subcutaneous Q4H  . insulin detemir  45 Units Subcutaneous BID  . levothyroxine  112 mcg Oral Q0600  . mouth rinse  15 mL Mouth Rinse 10 times per day  . multivitamin with minerals  1 tablet Per Tube Daily  . pantoprazole sodium  40 mg Per Tube BID  . polyethylene glycol  17 g Per Tube Daily  . sodium zirconium cyclosilicate  10 g Oral Daily  . vitamin C  500 mg Oral Daily  . zinc sulfate  220 mg Oral Daily   Infusions:  . sodium chloride Stopped (11/23/18 0159)  . amiodarone 30 mg/hr (11/28/18 0917)  . fentaNYL infusion INTRAVENOUS 175  mcg/hr (11/28/18 0900)  . midazolam 2 mg/hr (11/28/18 0900)  . phenylephrine 80 mcg/min (11/28/18 0900)    Assessment: 4 yoF admitted on 7/22 with PMH of afib and DVT on chronic Xarelto.  She was transitioned to Heparin IV for possible chest tube (not needed), and resume Xarelto on 7/29.  She then had black stools on 7/30 and Xarelto was d/c (last dose on 7/29 at 1400).  Pharmacy is now consulted to resume Heparin IV.  Today,  Back in afib. CBC: Hgb decreased further to 9.6, Plt decreased to 122 SCr 1.44 improved Note previously suprtherapeutic APTT and HL on heparin at 750 units/hr. Note previously reported minor oral bleeding (first noticed overnight 7/27) which appeared as dried, but does get minor ozzing/bleeding when giving mouthcare.  Source might be a tongue laceration.  RN today reports no s/s bleeding or complications.  Goal of Therapy:  Heparin level 0.3-0.7 units/ml Monitor platelets by anticoagulation protocol: Yes   Plan:  Baseline APTT and HL prior to starting. No heparin bolus Start heparin IV infusion at 700 units/hr (~ 8.5 unit/kg/hr Heparin weight) Heparin level 6 hours after starting Daily heparin level and CBC Continue to monitor H&H and platelets  Gretta Arab PharmD, BCPS Clinical pharmacist phone 7am- 5pm: [phone] 11/28/2018 9:50 AM

## 2018-11-28 NOTE — Progress Notes (Signed)
PROGRESS NOTE  [name] [name] VVY:[id number] DOB: [birth year] DOA: 11/22/2018  PCP: Nicholos Johns, MD  Brief History/Interval Summary: 68 y.o. female, with history of morbid obesity, DM type II, COPD not on oxygen, hypertension, CKD 3 baseline creatinine around 1.4, chronic atrial fibrillation Mali vas 2 score of at least 3 on Xarelto and Cardizem, spine surgeries in the past, chronic fluid retention, history of Graves' disease now hypothyroid, history of CVA, history of DVT who lives at [facility] and was likely exposed to COVID-19 infection about 10 to 14 days prior to this admission.    She was experiencing fever, body aches and developed shortness of breath.  She finally decided to come into the emergency department at [facility].  She was found to be positive for COVID-19.  Was noted to have hypoxia.  She was hospitalized for further management.  Reason for Visit: Acute respiratory failure with hypoxia due to COVID-19  Consultants: Pulmonology  Procedures:   Transthoracic echocardiogram 7/24 1. The left ventricle has hyperdynamic systolic function, with an ejection fraction of >65%. The cavity size was normal. Left ventricular diastolic Doppler parameters are consistent with impaired relaxation.  2. The right ventricle has normal systolc function. The cavity was normal. There is no increase in right ventricular wall thickness. Right ventricular systolic pressure is normal.  3. Mild thickening of the aortic valve. No stenosis of the aortic valve.  4. The aortic root and ascending aorta are normal in size and structure.    Antibiotics: Anti-infectives (From admission, onward)   Start     Dose/Rate Route Frequency Ordered Stop   11/19/18 2030  remdesivir 100 mg in sodium chloride 0.9 % 250 mL IVPB     100 mg 500 mL/hr over 30 Minutes Intravenous Every 24 hours 11/13/2018 1855 11/22/18 2204   11/26/2018 2030  remdesivir 200 mg in sodium chloride 0.9 % 250 mL IVPB     200 mg  500 mL/hr over 30 Minutes Intravenous Once 11/08/2018 1855 11/21/2018 2056       Subjective/Interval History: Remains intubated.  Continues to require vent support.  No further blood in the stool identified.  Low-grade temp overnight.  Assessment/Plan:  Acute Hypoxic Resp. Failure due to Acute Covid 19 Viral Illness  COVID-19 Labs  Recent Labs    11/26/18 0548 11/27/18 0450 11/28/18 0430  DDIMER 9.30* 8.05* 13.93*  FERRITIN  --   --  548*  LDH  --   --  757*  CRP  --   --  0.8    Lab Results  Component Value Date   SARSCOV2NAA POSITIVE (A) 11/11/2018     Fever: Remains afebrile Oxygen requirements: Mechanical ventilation.   Antibacterials: None Remdesivir: Completed course Steroids: Dexamethasone 6 mg once daily.  Being tapered.  Diuretics: Received Lasix before Actemra: Has received 2 doses, 7/22 and 7/23 Vitamin C and Zinc: Continue DVT Prophylaxis: Patient transitioned from rivaroxaban to IV heparin.  Patient remains intubated and sedated.  Pulmonology is following.   From a COVID-19 standpoint patient has completed course of Remdesivir.  She remains on steroids which is being tapered.  Her inflammatory markers have improved.  CRP is down to 2.3.  D-dimer 9.59.  WBC is stable.   Subcutaneous emphysema/pneumomediastinum Overnight on 7/26 she was also found to have subcutaneous emphysema.  Chest x-ray also which suggests mediastinal emphysema.  Chest x-ray findings have been stable.  No indication for chest tube placement.    Bleeding from oral cavity Melena Seems to be  stable.  Mild drop in hemoglobin noted.  Continue to monitor. Patient is on Xarelto and was on heparin.  Resuming heparin.  No further bleeding noted.  Acute renal failure on chronic kidney disease stage III per kalemia Baseline renal function not entirely known.  There is a creatinine measurement from 2017 of 1.28.  Creatinine was 0.9 in August 2018.   Creatinine has been elevated here in the  hospital along with her BUN.  She was given Lasix for poor urine output with brisk diuresis.  Potassium was 5.2 yesterday.  She was given Lokelma.  Potassium noted to be 5.5 this morning.  She will be given additional doses of Lokelma today.  Recheck labs tomorrow. She has chronic lower extremity edema, right greater than left.  She does take diuretics at home which was placed on hold at admission.  ARB is on hold.    History of chronic atrial fibrillation/episodes of V. tach Chads 2 vascular score is at least 3.  Patient was noted to have V. tach after she was intubated and she was placed on amiodarone infusion which was held due to bradycardia.  No recurrence of ventricular arrhythmia.   Had some SVT and A. fib with RVR intermittently. Estimated response to beta-blocker. Continue beta-blocker. Patient was on oral amiodarone but due to persistent A. fib with RVR transition back to IV amiodarone for now. Echocardiogram normal systolic function.  Diastolic dysfunction was noted.  No significant valvular abnormalities.  History of asthma versus COPD Continue her home medications.  She is not on home oxygen.  Hypothyroidism Continue levothyroxine.  As noted above her TSH was noted to be low.  Free T4 is 1.0.  No further work-up.  Low TSH likely sick euthyroid.  History of stroke Patient is on Xarelto and aspirin.  Switched over to IV heparin in case she needed to undergo chest tube placement. Now back on IV heparin. Not noted to be on statin.  Reason for this is not entirely clear.  This can be addressed when she is over her acute illness.  History of vitamin D deficiency Continue supplementation.  History of DVT She was on Xarelto.   Back on IV heparin.  Monitor closely.  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  Stable.  Diabetes mellitus type 2 with hyperglycemia CBGs are still poorly controlled.  Continue to  adjust dose of Levemir.  HbA1c 7.0.  Elevated CBG most likely due to steroids.  Should improve as steroid is tapered down.    Morbid obesity Body mass index is 45.3 kg/m.   Nutrition Continue tube feedings.  Constipation resolved Continue bowel regimen.  DVT Prophylaxis: On IV heparin PUD Prophylaxis: On Protonix Code Status: Full code Family Communication: Discussed with family.  Explained poor prognosis.  Monitor.  Goal remains full aggressive care for now.  Although family understand patient's prognosis. Disposition Plan: She will remain in the ICU.   Medications:  Scheduled: . chlorhexidine gluconate (MEDLINE KIT)  15 mL Mouth Rinse BID  . dexamethasone (DECADRON) injection  4 mg Intravenous Q24H  . feeding supplement (PRO-STAT SUGAR FREE 64)  30 mL Per Tube QID  . feeding supplement (VITAL AF 1.2 CAL)  1,000 mL Per Tube Q24H  . fentaNYL (SUBLIMAZE) injection  50 mcg Intravenous Once  . ferrous sulfate  300 mg Per Tube TID WC  . fluticasone furoate-vilanterol  1 puff Inhalation Daily  . free water  300 mL Per Tube  Q6H  . gabapentin  100 mg Per Tube Q8H  . insulin aspart  0-20 Units Subcutaneous Q4H  . insulin aspart  8 Units Subcutaneous Q4H  . insulin detemir  45 Units Subcutaneous BID  . levothyroxine  112 mcg Oral Q0600  . mouth rinse  15 mL Mouth Rinse 10 times per day  . multivitamin with minerals  1 tablet Per Tube Daily  . pantoprazole sodium  40 mg Per Tube BID  . polyethylene glycol  17 g Per Tube Daily  . sodium zirconium cyclosilicate  10 g Oral Daily  . vitamin C  500 mg Oral Daily  . zinc sulfate  220 mg Oral Daily   Continuous: . sodium chloride Stopped (11/23/18 0159)  . amiodarone 30 mg/hr (11/28/18 1700)  . fentaNYL infusion INTRAVENOUS 175 mcg/hr (11/28/18 1700)  . heparin 700 Units/hr (11/28/18 1700)  . midazolam    . phenylephrine 20 mcg/min (11/28/18 1700)   XKG:YJEHUD chloride, acetaminophen **OR** acetaminophen, albuterol, fentaNYL,  metoprolol tartrate, midazolam, sennosides, sodium phosphate, traZODone   Objective:  Vital Signs  Vitals:   11/28/18 1400 11/28/18 1532 11/28/18 1600 11/28/18 1700  BP: 121/64 113/60 116/62 134/66  Pulse: 83 78 77 89  Resp: (!) 23 (!) 32 15 15  Temp:   98.3 F (36.8 C)   TempSrc:   Oral   SpO2: 96% 96% 96% 90%  Weight:      Height:        Intake/Output Summary (Last 24 hours) at 11/28/2018 1754 Last data filed at 11/28/2018 1700 Gross per 24 hour  Intake 4335.52 ml  Output 4350 ml  Net -14.48 ml   Filed Weights   11/24/18 0500 11/27/18 0415 11/28/18 0400  Weight: 112.2 kg 115.3 kg 116 kg   General appearance: Remains intubated and sedated. Resp: Coarse breath sounds bilaterally.  Few crackles at the bases.  No wheezing or rhonchi. Cardio: S1-S2 irregularly irregular.  Tachycardic. GI: Abdomen is soft.  Nontender nondistended.  Bowel sounds are present normal.  No masses organomegaly Extremities: She has chronic lower extremity edema right greater than left.  Hyperpigmentation over the left leg. Neurologic: Sedated    Lab Results:  Data Reviewed: I have personally reviewed following labs and imaging studies  CBC: Recent Labs  Lab 11/22/18 0550  11/25/18 0515 11/26/18 0548 11/26/18 1525 11/26/18 1554 11/27/18 0450 11/28/18 0430  WBC 17.4*   < > 19.6* 20.4* 28.3*  --  27.5* 23.6*  NEUTROABS 15.7*  --   --   --   --   --   --  19.9*  HGB 12.9   < > 11.1* 11.5* 11.4* 12.2 10.6* 9.6*  HCT 42.8   < > 37.3 39.6 37.9 36.0 35.3* 31.8*  MCV 88.4   < > 91.0 92.3 91.1  --  91.0 90.3  PLT 179   < > 144* 139* 149*  --  139* 122*   < > = values in this interval not displayed.    Basic Metabolic Panel: Recent Labs  Lab 11/25/18 0515 11/26/18 0548 11/26/18 1525 11/26/18 1554 11/27/18 0450 11/28/18 0430  NA 146* 149* 149* 147* 151* 148*  K 5.5* 5.4* 5.8* 5.5* 5.3* 4.4  CL 110 108 108  --  109 106  CO2 30 33* 30  --  32 34*  GLUCOSE 278* 299* 227*  --  180* 288*   BUN 97* 130* 150*  --  149* 114*  CREATININE 1.52* 1.79* 2.10*  --  2.15* 1.44*  CALCIUM 7.8* 7.8* 7.9*  --  7.8* 7.6*  MG  --   --  3.0*  --  3.1* 2.7*    GFR: Estimated Creatinine Clearance: 45.9 mL/min (A) (by C-G formula based on SCr of 1.44 mg/dL (H)).  Liver Function Tests: Recent Labs  Lab 11/22/18 0550 11/27/18 0450 11/28/18 0430  AST 27 48* 57*  ALT 33 36 37  ALKPHOS 88 74 77  BILITOT 0.5 0.9 0.7  PROT 6.4* 5.2* 4.9*  ALBUMIN 2.5* 2.4* 2.3*      CBG: Recent Labs  Lab 11/27/18 2339 11/28/18 0336 11/28/18 0740 11/28/18 1143 11/28/18 1559  GLUCAP 332* 298* 253* 196* 187*      Recent Results (from the past 240 hour(s))  SARS Coronavirus 2 (CEPHEID- Performed in [place] hospital lab), Hosp Order     Status: Abnormal   Collection Time: 11/15/2018  8:29 PM   Specimen: Nasopharyngeal Swab  Result Value Ref Range Status   SARS Coronavirus 2 POSITIVE (A) NEGATIVE Final    Comment: CRITICAL RESULT CALLED TO, READ BACK BY AND VERIFIED WITH: RN P HOWARD AT 0106 11/19/18 CRUICKSHANK A (NOTE) If result is NEGATIVE SARS-CoV-2 target nucleic acids are NOT DETECTED. The SARS-CoV-2 RNA is generally detectable in upper and lower  respiratory specimens during the acute phase of infection. The lowest  concentration of SARS-CoV-2 viral copies this assay can detect is 250  copies / mL. A negative result does not preclude SARS-CoV-2 infection  and should not be used as the sole basis for treatment or other  patient management decisions.  A negative result may occur with  improper specimen collection / handling, submission of specimen other  than nasopharyngeal swab, presence of viral mutation(s) within the  areas targeted by this assay, and inadequate number of viral copies  (<250 copies / mL). A negative result must be combined with clinical  observations, patient history, and epidemiological information. If result is POSITIVE SARS-CoV-2 target nucleic acids a re  DETECTED. The SARS-CoV-2 RNA is generally detectable in upper and lower  respiratory specimens during the acute phase of infection.  Positive  results are indicative of active infection with SARS-CoV-2.  Clinical  correlation with patient history and other diagnostic information is  necessary to determine patient infection status.  Positive results do  not rule out bacterial infection or co-infection with other viruses. If result is PRESUMPTIVE POSTIVE SARS-CoV-2 nucleic acids MAY BE PRESENT.   A presumptive positive result was obtained on the submitted specimen  and confirmed on repeat testing.  While 2019 novel coronavirus  (SARS-CoV-2) nucleic acids may be present in the submitted sample  additional confirmatory testing may be necessary for epidemiological  and / or clinical management purposes  to differentiate between  SARS-CoV-2 and other Sarbecovirus currently known to infect humans.  If clinically indicated additional testing with an alternate test  methodolo gy [id number]) is advised. The SARS-CoV-2 RNA is generally  detectable in upper and lower respiratory specimens during the acute  phase of infection. The expected result is Negative. Fact Sheet for Patients:  StrictlyIdeas.no Fact Sheet for Healthcare Providers: BankingDealers.co.za This test is not yet approved or cleared by the [place] FDA and has been authorized for detection and/or diagnosis of SARS-CoV-2 by FDA under an Emergency Use Authorization (EUA).  This EUA will remain in effect (meaning this test can be used) for the duration of the COVID-19 declaration under Section 564(b)(1) of the Act, 21 U.S.C. section 360bbb-3(b)(1), unless the authorization is terminated or  revoked sooner. Performed at [facility], [place] [address], [place], [place] [address]   MRSA PCR Screening     Status: None   Collection Time: 10/29/2018 11:15 PM   Specimen:  Nasal Mucosa; Nasopharyngeal  Result Value Ref Range Status   MRSA by PCR NEGATIVE NEGATIVE Final    Comment:        The GeneXpert MRSA Assay (FDA approved for NASAL specimens only), is one component of a comprehensive MRSA colonization surveillance program. It is not intended to diagnose MRSA infection nor to guide or monitor treatment for MRSA infections. Performed at [facility], [place] [address] [place], [place] [address]       Radiology Studies: Dg Chest Port 1 View  Result Date: 11/27/2018 CLINICAL DATA:  68 year old female with a history respiratory failure EXAM: PORTABLE CHEST 1 VIEW COMPARISON:  Multiple prior comparison most recent November 26, 2018, November 25, 2018 November 24, 2018 FINDINGS: Cardiomediastinal silhouette unchanged in size and contour. Endotracheal tube terminates 3.9 cm above the carina. Left IJ venous catheter appears to terminate at the confluence of the SVC and the left brachiocephalic vein. Enteric feeding tube projects over the mediastinum and terminates out of the field of view. Similar appearance of elevation of the right hemidiaphragm. Airspace opacity at the left lung base similar to the comparison. No pneumothorax. No large pleural effusion. More mild airspace opacity on the right. IMPRESSION: Similar appearance of the lungs, with left greater than right airspace disease persisting. Endotracheal tube terminates 3.9 cm above the carina. Unchanged left IJ central venous catheter and enteric feeding tube. Similar appearance of elevation of the right hemidiaphragm. Electronically Signed   By: Corrie Mckusick D.O.   On: 11/27/2018 08:47       LOS: 10 days   [facility] Pager on [email or web address]  11/28/2018, 5:54 PM

## 2018-11-28 NOTE — Progress Notes (Signed)
Spoke with Daughter in [place] around 1500 today. All questions answered.

## 2018-11-28 NOTE — Progress Notes (Signed)
Spoke with patients granddaughter and daughter in law on separate calls and allowed them each a FaceTime Dietitian. All questions were answered.

## 2018-11-28 NOTE — Progress Notes (Signed)
NAME:  [name] [name], MRN:  [id number], DOB:  07/10/1950, LOS: 58 ADMISSION DATE:  11/09/2018, CONSULTATION DATE:  July 23 REFERRING MD:  Candiss Norse, CHIEF COMPLAINT:  dyspnea   Brief History   68 year old female admitted for severe acute respiratory failure with hypoxemia due to COVID-19 pneumonia  Past Medical History  Diabetes mellitus type 2 Obesity Asthma, has been on inhalers in the past, never smoked cigarettes CKD stage III Chronic A. fib History of spine surgery History of Graves' disease, now hypothyroid History of stroke History of DVT Lives in an assisted living facility [facility]   July 22 admitted July 23 intubated July 24 prone positioning July 26 subcutaneous air, no pneumothorax, afib with RVR again July 29 decreasing sedation, diuresis July 30 decreasing sedation July 31 holding additional diuresis  Consults:  PCCM  Procedures:  7/23 ETT > 7/23 L IJ CVL >  Significant Diagnostic Tests:  7/24 Echo > LVEF > 65%, impaired relaxation, RV normal systolic function, mild thickening of aortic valve  Micro Data:  July 22 SARS COV 2 Positive  Antimicrobials:  July 22 remdesivir> 7/26 July 22 decadron July 22> 23 Toclizumab   Interim history/subjective:  Minimal change in ventilator settings over the last several days Atrial fib and SVT overnight, back on amiodarone, continuing metoprolol  Objective   Blood pressure 121/64, pulse 83, temperature 98.8 F (37.1 C), temperature source Oral, resp. rate (!) 23, height 5\' 3"  (1.6 m), weight 116 kg, SpO2 96 %. CVP:  [3 mmHg-9 mmHg] 4 mmHg  Vent Mode: PCV FiO2 (%):  [50 %-60 %] 55 % Set Rate:  [24 bmp-30 bmp] 24 bmp PEEP:  [8 cmH20-10 cmH20] 10 cmH20 Plateau Pressure:  [17 cmH20-21 cmH20] 20 cmH20   Intake/Output Summary (Last 24 hours) at 11/28/2018 1514 Last data filed at 11/28/2018 1400 Gross per 24 hour  Intake 4685.84 ml  Output 4000 ml  Net 685.84 ml   Filed Weights   11/24/18 0500 11/27/18 0415 11/28/18 0400  Weight: 112.2 kg 115.3 kg 116 kg    Examination:  General:  In bed on vent HENT: NCAT ETT in place PULM: CTA B, vent supported breathing CV: RRR, no mgr GI: BS+, soft, nontender MSK: normal bulk and tone Neuro: sedated on vent  Chest x-ray from July 31 images independently reviewed showing essentially clear right lower lobe, some infiltrates right upper lobe, endotracheal tube in place, significant infiltrates in the left lung worse in the base, normal cardiac silhouette, left IJ CVL in place  Resolved Hospital Problem list    Assessment & Plan:  ARDS due to COVID-19 pneumonia Continue mechanical ventilation per ARDS protocol Target TVol 6-8cc/kgIBW Target Plateau Pressure < 30cm H20 Target driving pressure less than 15 cm of water Target PaO2 55-65: titrate PEEP/FiO2 per protocol As long as PaO2 to FiO2 ratio is less than 1:150 position in prone position for 16 hours a day Check CVP daily if CVL in place Target CVP less than 4, diurese as necessary Ventilator associated pneumonia prevention protocol 8/1: holding pattern on vent, follow protocol, no pressure support  Pneumomediastinum CXR in AM Monitor exam  Ventricular tachycardia noted on July 23' Atrial fib with RVR > back on amiodarone drip Tele Metoprolol Amiodarone infusion today, back to oral on 8/2 Heparin infusion  Need for sedation for mechanical ventilation/dyssynchrony RASS target -2 D/c intermittent paralytic protocol Change to PAD protocol  Best practice:  Diet: Tube feeding Pain/Anxiety/Delirium protocol (if indicated): as above VAP protocol (  if indicated): yes DVT prophylaxis: heparin infusion GI prophylaxis: Pantoprazole for stress ulcer prophylaxis Glucose control: SSI Mobility: bed rest Code Status: limited code Family Communication: will discuss with TRH Disposition: remian in ICU  Labs   CBC: Recent Labs  Lab 11/22/18 0550  11/25/18 0515  11/26/18 0548 11/26/18 1525 11/26/18 1554 11/27/18 0450 11/28/18 0430  WBC 17.4*   < > 19.6* 20.4* 28.3*  --  27.5* 23.6*  NEUTROABS 15.7*  --   --   --   --   --   --  19.9*  HGB 12.9   < > 11.1* 11.5* 11.4* 12.2 10.6* 9.6*  HCT 42.8   < > 37.3 39.6 37.9 36.0 35.3* 31.8*  MCV 88.4   < > 91.0 92.3 91.1  --  91.0 90.3  PLT 179   < > 144* 139* 149*  --  139* 122*   < > = values in this interval not displayed.    Basic Metabolic Panel: Recent Labs  Lab 11/21/18 1630  11/25/18 0515 11/26/18 0548 11/26/18 1525 11/26/18 1554 11/27/18 0450 11/28/18 0430  NA  --    < > 146* 149* 149* 147* 151* 148*  K  --    < > 5.5* 5.4* 5.8* 5.5* 5.3* 4.4  CL  --    < > 110 108 108  --  109 106  CO2  --    < > 30 33* 30  --  32 34*  GLUCOSE  --    < > 278* 299* 227*  --  180* 288*  BUN  --    < > 97* 130* 150*  --  149* 114*  CREATININE  --    < > 1.52* 1.79* 2.10*  --  2.15* 1.44*  CALCIUM  --    < > 7.8* 7.8* 7.9*  --  7.8* 7.6*  MG 2.3  --   --   --  3.0*  --  3.1* 2.7*  PHOS 4.2  --   --   --   --   --   --   --    < > = values in this interval not displayed.   GFR: Estimated Creatinine Clearance: 45.9 mL/min (A) (by C-G formula based on SCr of 1.44 mg/dL (H)). Recent Labs  Lab 11/26/18 0548 11/26/18 1525 11/27/18 0450 11/28/18 0430  WBC 20.4* 28.3* 27.5* 23.6*    Liver Function Tests: Recent Labs  Lab 11/22/18 0550 11/27/18 0450 11/28/18 0430  AST 27 48* 57*  ALT 33 36 37  ALKPHOS 88 74 77  BILITOT 0.5 0.9 0.7  PROT 6.4* 5.2* 4.9*  ALBUMIN 2.5* 2.4* 2.3*   No results for input(s): LIPASE, AMYLASE in the last 168 hours. No results for input(s): AMMONIA in the last 168 hours.  ABG    Component Value Date/Time   PHART 7.307 (L) 11/26/2018 1554   PCO2ART 64.4 (H) 11/26/2018 1554   PO2ART 65.0 (L) 11/26/2018 1554   HCO3 31.8 (H) 11/26/2018 1554   TCO2 34 (H) 11/26/2018 1554   ACIDBASEDEF 3.0 (H) 11/21/2018 0628   O2SAT 88.0 11/26/2018 1554     Coagulation Profile:  No results for input(s): INR, PROTIME in the last 168 hours.  Cardiac Enzymes: No results for input(s): CKTOTAL, CKMB, CKMBINDEX, TROPONINI in the last 168 hours.  HbA1C: Hgb A1c MFr Bld  Date/Time Value Ref Range Status  11/03/2018 08:00 PM 7.0 (H) 4.8 - 5.6 % Final    Comment:    (NOTE)  Pre diabetes:          5.7%-6.4% Diabetes:              >6.4% Glycemic control for   <7.0% adults with diabetes   10/18/2016 10:47 AM 6.0 (H) 4.8 - 5.6 % Final    Comment:    (NOTE)         Pre-diabetes: 5.7 - 6.4         Diabetes: >6.4         Glycemic control for adults with diabetes: <7.0     CBG: Recent Labs  Lab 11/27/18 1932 11/27/18 2339 11/28/18 0336 11/28/18 0740 11/28/18 1143  GLUCAP 302* 332* 298* 253* 196*    Critical care time: 35 minutes    Roselie Awkward, MD [facility] Pager: [phone] Cell: [phone] If no response, call [phone]

## 2018-11-28 NOTE — Progress Notes (Signed)
ANTICOAGULATION CONSULT NOTE - Follow Up Consult  Pharmacy Consult for Heparin Indication: atrial fibrillation  (Hx DVT on PTA Xarelto)  Allergies  Allergen Reactions  . Sulfonamide Derivatives Itching    Patient Measurements: Height: '5\' 3"'$  (160 cm) Weight: 255 lb 11.7 oz (116 kg) IBW/kg (Calculated) : 52.4 Heparin Dosing Weight: 80.5 kg  Vital Signs: Temp: 98.3 F (36.8 C) (08/01 1600) Temp Source: Oral (08/01 1600) BP: 118/66 (08/01 1800) Pulse Rate: 90 (08/01 1800)  Labs: Recent Labs    11/26/18 1525 11/26/18 1554 11/27/18 0450 11/28/18 0430 11/28/18 1030 11/28/18 1720  HGB 11.4* 12.2 10.6* 9.6*  --   --   HCT 37.9 36.0 35.3* 31.8*  --   --   PLT 149*  --  139* 122*  --   --   APTT  --   --   --   --  22*  --   HEPARINUNFRC  --   --   --   --  <0.10* 0.25*  CREATININE 2.10*  --  2.15* 1.44*  --   --     Estimated Creatinine Clearance: 45.9 mL/min (A) (by C-G formula based on SCr of 1.44 mg/dL (H)).   Medications:  Scheduled:  . chlorhexidine gluconate (MEDLINE KIT)  15 mL Mouth Rinse BID  . dexamethasone (DECADRON) injection  4 mg Intravenous Q24H  . feeding supplement (PRO-STAT SUGAR FREE 64)  30 mL Per Tube QID  . feeding supplement (VITAL AF 1.2 CAL)  1,000 mL Per Tube Q24H  . fentaNYL (SUBLIMAZE) injection  50 mcg Intravenous Once  . ferrous sulfate  300 mg Per Tube TID WC  . fluticasone furoate-vilanterol  1 puff Inhalation Daily  . free water  300 mL Per Tube Q6H  . gabapentin  100 mg Per Tube Q8H  . insulin aspart  0-20 Units Subcutaneous Q4H  . insulin aspart  8 Units Subcutaneous Q4H  . insulin detemir  45 Units Subcutaneous BID  . levothyroxine  112 mcg Oral Q0600  . mouth rinse  15 mL Mouth Rinse 10 times per day  . multivitamin with minerals  1 tablet Per Tube Daily  . pantoprazole sodium  40 mg Per Tube BID  . polyethylene glycol  17 g Per Tube Daily  . sodium zirconium cyclosilicate  10 g Oral Daily  . vitamin C  500 mg Oral Daily  .  zinc sulfate  220 mg Oral Daily   Infusions:  . sodium chloride Stopped (11/23/18 0159)  . amiodarone 30 mg/hr (11/28/18 1800)  . fentaNYL infusion INTRAVENOUS 125 mcg/hr (11/28/18 1800)  . heparin 700 Units/hr (11/28/18 1800)  . midazolam    . phenylephrine Stopped (11/28/18 1700)    Assessment: 65 yoF admitted on 7/22 with PMH of afib and DVT on chronic Xarelto.  She was transitioned to Heparin IV for possible chest tube (not needed), and resume Xarelto on 7/29.  She then had black stools on 7/30 and Xarelto was d/c (last dose on 7/29 at 1400).  Pharmacy is now consulted to resume Heparin IV.  11/28/2018 Back in afib. CBC: Hgb decreased further to 9.6, Plt decreased to 122 SCr 1.44 improved Note previously suprtherapeutic APTT and HL on heparin at 750 units/hr. Note previously reported minor oral bleeding (first noticed overnight 7/27) which appeared as dried, but does get minor ozzing/bleeding when giving mouthcare.  Source might be a tongue laceration.  RN today reports no s/s bleeding or complications.  6:35 PM  Heparin level 6hr after starting  drip is 0.25 (subtherapeutic).  No bleeding or complications reported.  Goal of Therapy:  Heparin level 0.3-0.7 units/ml Monitor platelets by anticoagulation protocol: Yes   Plan:  Increase heparin IV infusion to 800 units/hr Heparin level 8 hours after rate increase Daily heparin level and CBC Continue to monitor H&H and platelets  Peggyann Juba, PharmD, BCPS Pager: [phone] 11/28/2018 6:34 PM

## 2018-11-28 DEATH — deceased

## 2018-11-29 ENCOUNTER — Inpatient Hospital Stay (HOSPITAL_COMMUNITY): Payer: Medicare Other

## 2018-11-29 LAB — CBC WITH DIFFERENTIAL/PLATELET
Abs Immature Granulocytes: 0.75 10*3/uL — ABNORMAL HIGH (ref 0.00–0.07)
Basophils Absolute: 0.1 10*3/uL (ref 0.0–0.1)
Basophils Relative: 0 %
Eosinophils Absolute: 0 10*3/uL (ref 0.0–0.5)
Eosinophils Relative: 0 %
HCT: 31.3 % — ABNORMAL LOW (ref 36.0–46.0)
Hemoglobin: 9.3 g/dL — ABNORMAL LOW (ref 12.0–15.0)
Immature Granulocytes: 5 %
Lymphocytes Relative: 6 %
Lymphs Abs: 0.9 10*3/uL (ref 0.7–4.0)
MCH: 26.8 pg (ref 26.0–34.0)
MCHC: 29.7 g/dL — ABNORMAL LOW (ref 30.0–36.0)
MCV: 90.2 fL (ref 80.0–100.0)
Monocytes Absolute: 0.9 10*3/uL (ref 0.1–1.0)
Monocytes Relative: 6 %
Neutro Abs: 11.9 10*3/uL — ABNORMAL HIGH (ref 1.7–7.7)
Neutrophils Relative %: 83 %
Platelets: UNDETERMINED 10*3/uL (ref 150–400)
RBC: 3.47 MIL/uL — ABNORMAL LOW (ref 3.87–5.11)
RDW: 13.7 % (ref 11.5–15.5)
WBC: 14.5 10*3/uL — ABNORMAL HIGH (ref 4.0–10.5)
nRBC: 9.9 % — ABNORMAL HIGH (ref 0.0–0.2)

## 2018-11-29 LAB — COMPREHENSIVE METABOLIC PANEL
ALT: 33 U/L (ref 0–44)
AST: 57 U/L — ABNORMAL HIGH (ref 15–41)
Albumin: 2.3 g/dL — ABNORMAL LOW (ref 3.5–5.0)
Alkaline Phosphatase: 72 U/L (ref 38–126)
Anion gap: 9 (ref 5–15)
BUN: 89 mg/dL — ABNORMAL HIGH (ref 8–23)
CO2: 34 mmol/L — ABNORMAL HIGH (ref 22–32)
Calcium: 7.6 mg/dL — ABNORMAL LOW (ref 8.9–10.3)
Chloride: 102 mmol/L (ref 98–111)
Creatinine, Ser: 1.22 mg/dL — ABNORMAL HIGH (ref 0.44–1.00)
GFR calc Af Amer: 53 mL/min — ABNORMAL LOW (ref >60)
GFR calc non Af Amer: 45 mL/min — ABNORMAL LOW (ref >60)
Glucose, Bld: 204 mg/dL — ABNORMAL HIGH (ref 70–99)
Potassium: 3.9 mmol/L (ref 3.5–5.1)
Sodium: 145 mmol/L (ref 135–145)
Total Bilirubin: 0.8 mg/dL (ref 0.3–1.2)
Total Protein: 5 g/dL — ABNORMAL LOW (ref 6.5–8.1)

## 2018-11-29 LAB — D-DIMER, QUANTITATIVE: D-Dimer, Quant: 19.6 ug/mL-FEU — ABNORMAL HIGH (ref 0.00–0.50)

## 2018-11-29 LAB — C-REACTIVE PROTEIN: CRP: <0.8 mg/dL (ref <1.0)

## 2018-11-29 LAB — GLUCOSE, CAPILLARY
Glucose-Capillary: 199 mg/dL — ABNORMAL HIGH (ref 70–99)
Glucose-Capillary: 165 mg/dL — ABNORMAL HIGH (ref 70–99)
Glucose-Capillary: 176 mg/dL — ABNORMAL HIGH (ref 70–99)
Glucose-Capillary: 161 mg/dL — ABNORMAL HIGH (ref 70–99)
Glucose-Capillary: 194 mg/dL — ABNORMAL HIGH (ref 70–99)

## 2018-11-29 LAB — LACTATE DEHYDROGENASE: LDH: 918 U/L — ABNORMAL HIGH (ref 98–192)

## 2018-11-29 LAB — BRAIN NATRIURETIC PEPTIDE: B Natriuretic Peptide: 132 pg/mL — ABNORMAL HIGH (ref 0.0–100.0)

## 2018-11-29 LAB — HEPARIN LEVEL (UNFRACTIONATED): Heparin Unfractionated: 0.43 IU/mL (ref 0.30–0.70)

## 2018-11-29 LAB — MAGNESIUM: Magnesium: 2.7 mg/dL — ABNORMAL HIGH (ref 1.7–2.4)

## 2018-11-29 LAB — FERRITIN: Ferritin: 220 ng/mL (ref 11–307)

## 2018-11-29 MED ORDER — AMIODARONE HCL 200 MG PO TABS: 200.0000 mg | ORAL_TABLET | Freq: Two times a day (BID) | ORAL | Status: DC

## 2018-11-29 MED ORDER — INSULIN DETEMIR 100 UNIT/ML ~~LOC~~ SOLN
50.0000 [IU] | Freq: Two times a day (BID) | SUBCUTANEOUS | Status: DC
  Administered 2018-11-29 – 2018-12-06 (×11): 50 [IU] via SUBCUTANEOUS
  Filled 2018-11-29 (×18): qty 0.5

## 2018-11-29 MED ORDER — AMIODARONE HCL 200 MG PO TABS
400.0000 mg | ORAL_TABLET | Freq: Two times a day (BID) | ORAL | Status: DC
  Administered 2018-11-29 (×2): 400 mg via ORAL
  Filled 2018-11-29: qty 2

## 2018-11-29 MED ORDER — METOPROLOL TARTRATE 5 MG/5ML IV SOLN
5.0000 mg | INTRAVENOUS | Status: DC | PRN
  Administered 2018-11-29: 5 mg via INTRAVENOUS
  Filled 2018-11-29 (×4): qty 5

## 2018-11-29 MED ORDER — AMIODARONE IV BOLUS ONLY 150 MG/100ML
150.0000 mg | Freq: Once | INTRAVENOUS | Status: AC | PRN
  Administered 2018-11-29: 150 mg via INTRAVENOUS
  Filled 2018-11-29: qty 100

## 2018-11-29 MED ORDER — FUROSEMIDE 10 MG/ML IJ SOLN
40.0000 mg | Freq: Once | INTRAMUSCULAR | Status: AC
  Administered 2018-11-29: 40 mg via INTRAVENOUS
  Filled 2018-11-29: qty 4

## 2018-11-29 MED ORDER — INSULIN ASPART 100 UNIT/ML ~~LOC~~ SOLN
10.0000 [IU] | SUBCUTANEOUS | Status: DC
  Administered 2018-11-29 – 2018-12-01 (×15): 10 [IU] via SUBCUTANEOUS
  Administered 2018-12-02: 8 [IU] via SUBCUTANEOUS
  Administered 2018-12-02 – 2018-12-07 (×21): 10 [IU] via SUBCUTANEOUS

## 2018-11-29 NOTE — Progress Notes (Signed)
NAME:  [name] [name], MRN:  [id number], DOB:  1950-05-29, LOS: 25 ADMISSION DATE:  10/30/2018, CONSULTATION DATE:  July 23 REFERRING MD:  Candiss Norse, CHIEF COMPLAINT:  dyspnea   Brief History   68 year old female admitted for severe acute respiratory failure with hypoxemia due to COVID-19 pneumonia  Past Medical History  Diabetes mellitus type 2 Obesity Asthma, has been on inhalers in the past, never smoked cigarettes CKD stage III Chronic A. fib History of spine surgery History of Graves' disease, now hypothyroid History of stroke History of DVT Lives in an assisted living facility [facility]   July 22 admitted July 23 intubated July 24 prone positioning July 26 subcutaneous air, no pneumothorax, afib with RVR again July 29 decreasing sedation, diuresis July 30 decreasing sedation July 31 holding additional diuresis 8/2 weaning on pressure support  Consults:  PCCM  Procedures:  7/23 ETT > 7/23 L IJ CVL >  Significant Diagnostic Tests:  7/24 Echo > LVEF > 65%, impaired relaxation, RV normal systolic function, mild thickening of aortic valve  Micro Data:  July 22 SARS COV 2 Positive  Antimicrobials:  July 22 remdesivir> 7/26 July 22 decadron July 22> 23 Toclizumab   Interim history/subjective:   On pressure support this morning   Objective   Blood pressure 129/68, pulse 78, temperature 98.4 F (36.9 C), temperature source Oral, resp. rate 20, height 5\' 3"  (1.6 m), weight 115.7 kg, SpO2 91 %. CVP:  [3 mmHg-8 mmHg] 5 mmHg  Vent Mode: PCV FiO2 (%):  [50 %-55 %] 55 % Set Rate:  [24 bmp-30 bmp] 24 bmp PEEP:  [10 cmH20] 10 cmH20 Plateau Pressure:  [20 cmH20-22 cmH20] 22 cmH20   Intake/Output Summary (Last 24 hours) at 11/29/2018 0756 Last data filed at 11/29/2018 9518 Gross per 24 hour  Intake 2648.79 ml  Output 2400 ml  Net 248.79 ml   Filed Weights   11/27/18 0415 11/28/18 0400 11/29/18 0500  Weight: 115.3 kg 116 kg 115.7 kg     Examination:  General:  In bed on vent HENT: NCAT ETT in place PULM: CTA B, vent supported breathing CV: RRR, no mgr GI: BS+, soft, nontender MSK: normal bulk and tone Neuro: sedated on vent   Chest x-ray from July 31 images independently reviewed showing essentially clear right lower lobe, some infiltrates right upper lobe, endotracheal tube in place, significant infiltrates in the left lung worse in the base, normal cardiac silhouette, left IJ CVL in place  Resolved Hospital Problem list    Assessment & Plan:  ARDS due to COVID-19 pneumonia Continue pressure support wean today as long as tolerated, then resume full vent support overnight with ARDS settings: Continue mechanical ventilation per ARDS protocol Target TVol 6-8cc/kgIBW Target Plateau Pressure < 30cm H20 Target driving pressure less than 15 cm of water Target PaO2 55-65: titrate PEEP/FiO2 per protocol As long as PaO2 to FiO2 ratio is less than 1:150 position in prone position for 16 hours a day Check CVP daily if CVL in place Target CVP less than 4, diurese as necessary Ventilator associated pneumonia prevention protocol  Pneumomediastinum Repeat CXR on 8/3  Ventricular tachycardia noted on July 23' Atrial fib with RVR > improved again on 8/2 Tele Metoprolol Amiodarone> change back to oral on 8/2 Heparin infusion  Need for sedation for mechanical ventilation/dyssynchrony RASS target -2 PAD protocol: fentanyl/versed infusions, wean off as tolerated today   Best practice:  Diet: Tube feeding Pain/Anxiety/Delirium protocol (if indicated): as above VAP protocol (  if indicated): yes DVT prophylaxis: heparin infusion GI prophylaxis: Pantoprazole for stress ulcer prophylaxis Glucose control: SSI Mobility: bed rest Code Status: limited code Family Communication: will discuss with TRH Disposition: remian in ICU  Labs   CBC: Recent Labs  Lab 11/26/18 0548 11/26/18 1525 11/26/18 1554 11/27/18 0450  11/28/18 0430 11/29/18 0435  WBC 20.4* 28.3*  --  27.5* 23.6* 14.5*  NEUTROABS  --   --   --   --  19.9* 11.9*  HGB 11.5* 11.4* 12.2 10.6* 9.6* 9.3*  HCT 39.6 37.9 36.0 35.3* 31.8* 31.3*  MCV 92.3 91.1  --  91.0 90.3 90.2  PLT 139* 149*  --  139* 122* PLATELET CLUMPS NOTED ON SMEAR, UNABLE TO ESTIMATE    Basic Metabolic Panel: Recent Labs  Lab 11/26/18 1525 11/26/18 1554 11/27/18 0450 11/28/18 0430 11/28/18 1720 11/29/18 0435  NA 149* 147* 151* 148* 145 145  K 5.8* 5.5* 5.3* 4.4 4.3 3.9  CL 108  --  109 106 101 102  CO2 30  --  32 34* 31 34*  GLUCOSE 227*  --  180* 288* 327* 204*  BUN 150*  --  149* 114* 95* 89*  CREATININE 2.10*  --  2.15* 1.44* 1.23* 1.22*  CALCIUM 7.9*  --  7.8* 7.6* 7.2* 7.6*  MG 3.0*  --  3.1* 2.7*  --  2.7*   GFR: Estimated Creatinine Clearance: 54.1 mL/min (A) (by C-G formula based on SCr of 1.22 mg/dL (H)). Recent Labs  Lab 11/26/18 1525 11/27/18 0450 11/28/18 0430 11/29/18 0435  WBC 28.3* 27.5* 23.6* 14.5*    Liver Function Tests: Recent Labs  Lab 11/27/18 0450 11/28/18 0430 11/29/18 0435  AST 48* 57* 57*  ALT 36 37 33  ALKPHOS 74 77 72  BILITOT 0.9 0.7 0.8  PROT 5.2* 4.9* 5.0*  ALBUMIN 2.4* 2.3* 2.3*   No results for input(s): LIPASE, AMYLASE in the last 168 hours. No results for input(s): AMMONIA in the last 168 hours.  ABG    Component Value Date/Time   PHART 7.307 (L) 11/26/2018 1554   PCO2ART 64.4 (H) 11/26/2018 1554   PO2ART 65.0 (L) 11/26/2018 1554   HCO3 31.8 (H) 11/26/2018 1554   TCO2 34 (H) 11/26/2018 1554   ACIDBASEDEF 3.0 (H) 11/21/2018 0628   O2SAT 88.0 11/26/2018 1554     Coagulation Profile: No results for input(s): INR, PROTIME in the last 168 hours.  Cardiac Enzymes: No results for input(s): CKTOTAL, CKMB, CKMBINDEX, TROPONINI in the last 168 hours.  HbA1C: Hgb A1c MFr Bld  Date/Time Value Ref Range Status  11/14/2018 08:00 PM 7.0 (H) 4.8 - 5.6 % Final    Comment:    (NOTE) Pre diabetes:           5.7%-6.4% Diabetes:              >6.4% Glycemic control for   <7.0% adults with diabetes   10/18/2016 10:47 AM 6.0 (H) 4.8 - 5.6 % Final    Comment:    (NOTE)         Pre-diabetes: 5.7 - 6.4         Diabetes: >6.4         Glycemic control for adults with diabetes: <7.0     CBG: Recent Labs  Lab 11/28/18 1559 11/28/18 2004 11/28/18 2342 11/29/18 0401 11/29/18 0645  GLUCAP 187* 223* 213* 199* 165*    Critical care time: 35 minutes    Roselie Awkward, MD [place] [facility] Pager: [phone] Cell: (  [phone] If no response, call [phone]

## 2018-11-29 NOTE — Progress Notes (Signed)
PROGRESS NOTE  [name] [name] WLS:[id number] DOB: 07/18/1950 DOA: 11/17/2018  PCP: Nicholos Johns, MD  Brief History/Interval Summary: 68 y.o. female, with history of morbid obesity, DM type II, COPD not on oxygen, hypertension, CKD 3 baseline creatinine around 1.4, chronic atrial fibrillation [place] vas 2 score of at least 3 on Xarelto and Cardizem, spine surgeries in the past, chronic fluid retention, history of Graves' disease now hypothyroid, history of CVA, history of DVT who lives at [facility] and was likely exposed to COVID-19 infection about 10 to 14 days prior to this admission.    She was experiencing fever, body aches and developed shortness of breath.  She finally decided to come into the emergency department at [facility].  She was found to be positive for COVID-19.  Was noted to have hypoxia.  She was hospitalized for further management.  Reason for Visit: Acute respiratory failure with hypoxia due to COVID-19  Consultants: Pulmonology  Procedures:  >Intubation 11/19/2018 >Central line insertion 11/19/2018 >Cortrack replacement 11/25/2018 >Transthoracic echocardiogram 7/24 1. The left ventricle has hyperdynamic systolic function, with an ejection fraction of >65%. The cavity size was normal. Left ventricular diastolic Doppler parameters are consistent with impaired relaxation.  2. The right ventricle has normal systolc function. The cavity was normal. There is no increase in right ventricular wall thickness. Right ventricular systolic pressure is normal.  3. Mild thickening of the aortic valve. No stenosis of the aortic valve.  4. The aortic root and ascending aorta are normal in size and structure.   Antibiotics: Anti-infectives (From admission, onward)   Start     Dose/Rate Route Frequency Ordered Stop   11/19/18 2030  remdesivir 100 mg in sodium chloride 0.9 % 250 mL IVPB     100 mg 500 mL/hr over 30 Minutes Intravenous Every 24 hours 11/19/2018 1855 11/22/18 2204    11/23/2018 2030  remdesivir 200 mg in sodium chloride 0.9 % 250 mL IVPB     200 mg 500 mL/hr over 30 Minutes Intravenous Once 11/15/2018 1855 11/05/2018 2056     Subjective/Interval History: No acute events.  Briefly was able to tolerate weaning.  No nausea no vomiting no fever no chills.  No BM in last 24 hours.  Adequate urine output.  Tolerating tube feeding.  Assessment/Plan: Acute Hypoxic Resp. Failure due to Acute Covid 19 Viral Illness ARDS  Recent Labs    11/27/18 0450 11/28/18 0430 11/29/18 0435  DDIMER 8.05* 13.93* 19.60*  FERRITIN  --  548* 220  LDH  --  757* 918*  CRP  --  0.8 <0.8    Lab Results  Component Value Date   SARSCOV2NAA POSITIVE (A) 11/16/2018   Fever: Remains afebrile Oxygen requirements: Mechanical ventilation.   Antibacterials: None Remdesivir: Completed course Steroids: Dexamethasone started on 11/02/2018 6 mg once daily.  Being tapered.  Diuretics: Lasix as needed Actemra: Has received 2 doses, 7/22 and 7/23 Vitamin C and Zinc: Continue DVT Prophylaxis: Patient transitioned from rivaroxaban to IV heparin.  Patient remains intubated and sedated.  Pulmonology is following.   Subcutaneous emphysema/pneumomediastinum On 7/26 she was also found to have subcutaneous emphysema. Chest x-ray also which suggests mediastinal emphysema. Chest x-ray findings have been stable.  Conservative management. No indication for chest tube placement.  Bleeding from oral cavity Melena Patient is on Xarelto and was on heparin.  Resuming heparin.  No further bleeding noted. Hemoglobin remained stable.  Monitor.  Acute renal failure on chronic kidney disease stage III  Hyperkalemia Hypernatremia-resolved Baseline  renal function not entirely known.  There is a creatinine measurement from 2017 of 1.28.  Creatinine was 0.9 in August 2018.   Creatinine has been elevated here in the hospital along with her BUN.   She does take diuretics at home which was placed on  hold at admission.  ARB is on hold.   Urine output adequate. Serum creatinine peaked at 2.15. BUN peaked at 150. Now creatinine and BUN both trending down. Patient's potassium was also elevated and she was treated with Lokelma.  Now potassium is better. Serum sodium was 151 at peak.  We will free water with improvement.  Continue free water for now.  History of chronic atrial fibrillation/ episodes of V. tach Chads 2 vasc score is at least 3. Patient was noted to have V. tach after she was intubated and she was placed on amiodarone infusion which was held due to bradycardia.  No recurrence of ventricular arrhythmia.   Had some SVT and A. fib with RVR intermittently.  Responding to Lopressor. Continue beta-blocker. Patient was on oral amiodarone but due to persistent A. fib with RVR transition back to IV amiodarone for now. Will change back to oral amiodarone 400 mg twice daily for 1 week and 200 mg twice daily after that. Recommend to use amiodarone bolus if RVR is refractory to Lopressor. Echocardiogram normal systolic function.  Diastolic dysfunction was noted.  No significant valvular abnormalities.  History of asthma versus COPD Continue her home medications.  She is not on home oxygen.  Hypothyroidism Continue levothyroxine.   As noted above her TSH was noted to be low.  Free T4 is 1.0.  No further work-up.  Low TSH likely sick euthyroid.  History of stroke Patient is on Xarelto and aspirin.  Switched over to IV heparin in case she needed to undergo chest tube placement. Now back on IV heparin. Not noted to be on statin.  Reason for this is not entirely clear.  This can be addressed when she is over her acute illness.  History of vitamin D deficiency Continue supplementation.  History of DVT She was on Xarelto.   Back on IV heparin.  Monitor closely.  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   Stable.  Diabetes mellitus type 2 with hyperglycemia CBGs are still poorly controlled.  Continue to adjust dose of Levemir.  HbA1c 7.0.  Elevated CBG most likely due to steroids.  Should improve as steroid is tapered down.    Morbid obesity Body mass index is 45.18 kg/m.   Nutrition Continue tube feedings.  Constipation resolved Continue bowel regimen.  Mildly elevated AST. Monitor for now.  Acute blood loss anemia. Hemoglobin on admission was 12.6. Patient had some oral bleed as well as GI bleed. Currently hemoglobin 9.3. Monitor.  Currently on oral iron.  Leukocytosis. In the response to COVID-19 fraction. Currently trending down. Monitor.  DVT Prophylaxis: On IV heparin PUD Prophylaxis: On Protonix Code Status: Full code Family Communication: Discussed with family.  Goal remains full aggressive care for now.  Although family understand patient's prognosis. Disposition Plan: She will remain in the ICU.   Medications:  Scheduled: . [START ON 12/06/2018] amiodarone  200 mg Oral BID  . amiodarone  400 mg Oral BID  . chlorhexidine gluconate (MEDLINE KIT)  15 mL Mouth Rinse BID  . dexamethasone (DECADRON) injection  4 mg Intravenous Q24H  . feeding supplement (PRO-STAT SUGAR FREE 64)  30 mL Per Tube  QID  . feeding supplement (VITAL AF 1.2 CAL)  1,000 mL Per Tube Q24H  . fentaNYL (SUBLIMAZE) injection  50 mcg Intravenous Once  . ferrous sulfate  300 mg Per Tube TID WC  . fluticasone furoate-vilanterol  1 puff Inhalation Daily  . free water  300 mL Per Tube Q6H  . gabapentin  100 mg Per Tube Q8H  . insulin aspart  0-20 Units Subcutaneous Q4H  . insulin aspart  10 Units Subcutaneous Q4H  . insulin detemir  50 Units Subcutaneous BID  . levothyroxine  112 mcg Oral Q0600  . mouth rinse  15 mL Mouth Rinse 10 times per day  . multivitamin with minerals  1 tablet Per Tube Daily  . pantoprazole sodium  40 mg Per Tube BID  . polyethylene glycol  17 g Per Tube Daily  . vitamin  C  500 mg Oral Daily  . zinc sulfate  220 mg Oral Daily   Continuous: . sodium chloride Stopped (11/23/18 0159)  . amiodarone    . fentaNYL infusion INTRAVENOUS 25 mcg/hr (11/29/18 1600)  . heparin 800 Units/hr (11/29/18 1600)  . midazolam 0 mg/hr (11/29/18 1212)  . phenylephrine Stopped (11/28/18 1700)   KDX:IPJASN chloride, acetaminophen **OR** acetaminophen, albuterol, amiodarone, fentaNYL, metoprolol tartrate, midazolam, sennosides, sodium phosphate, traZODone   Objective:  Vital Signs  Vitals:   11/29/18 1400 11/29/18 1500 11/29/18 1523 11/29/18 1600  BP: 140/73 (!) 156/73 (!) 156/73 (!) 164/73  Pulse: 98 (!) 102 (!) 108 (!) 114  Resp: (!) 25 (!) 30 (!) 32 (!) 70  Temp:    99.1 F (37.3 C)  TempSrc:    Oral  SpO2: (!) 89% (!) 88% (!) 87% (!) 86%  Weight:      Height:        Intake/Output Summary (Last 24 hours) at 11/29/2018 1635 Last data filed at 11/29/2018 1600 Gross per 24 hour  Intake 2173.08 ml  Output 2625 ml  Net -451.92 ml   Filed Weights   11/27/18 0415 11/28/18 0400 11/29/18 0500  Weight: 115.3 kg 116 kg 115.7 kg   General appearance: Remains intubated and sedated. Resp: Coarse breath sounds bilaterally.  Few crackles at the bases.  No wheezing or rhonchi. Cardio: S1-S2 irregularly irregular.  Tachycardic. GI: Abdomen is soft.  Nontender nondistended.  Bowel sounds are present normal.  No masses organomegaly Extremities: She has chronic lower extremity edema right greater than left.  Hyperpigmentation over the left leg. Neurologic: Sedated    Lab Results:  Data Reviewed: I have personally reviewed following labs and imaging studies  CBC: Recent Labs  Lab 11/26/18 0548 11/26/18 1525 11/26/18 1554 11/27/18 0450 11/28/18 0430 11/29/18 0435  WBC 20.4* 28.3*  --  27.5* 23.6* 14.5*  NEUTROABS  --   --   --   --  19.9* 11.9*  HGB 11.5* 11.4* 12.2 10.6* 9.6* 9.3*  HCT 39.6 37.9 36.0 35.3* 31.8* 31.3*  MCV 92.3 91.1  --  91.0 90.3 90.2  PLT 139*  149*  --  139* 122* PLATELET CLUMPS NOTED ON SMEAR, UNABLE TO ESTIMATE    Basic Metabolic Panel: Recent Labs  Lab 11/26/18 1525 11/26/18 1554 11/27/18 0450 11/28/18 0430 11/28/18 1720 11/29/18 0435  NA 149* 147* 151* 148* 145 145  K 5.8* 5.5* 5.3* 4.4 4.3 3.9  CL 108  --  109 106 101 102  CO2 30  --  32 34* 31 34*  GLUCOSE 227*  --  180* 288* 327* 204*  BUN 150*  --  149* 114* 95* 89*  CREATININE 2.10*  --  2.15* 1.44* 1.23* 1.22*  CALCIUM 7.9*  --  7.8* 7.6* 7.2* 7.6*  MG 3.0*  --  3.1* 2.7*  --  2.7*    GFR: Estimated Creatinine Clearance: 54.1 mL/min (A) (by C-G formula based on SCr of 1.22 mg/dL (H)).  Liver Function Tests: Recent Labs  Lab 11/27/18 0450 11/28/18 0430 11/29/18 0435  AST 48* 57* 57*  ALT 36 37 33  ALKPHOS 74 77 72  BILITOT 0.9 0.7 0.8  PROT 5.2* 4.9* 5.0*  ALBUMIN 2.4* 2.3* 2.3*      CBG: Recent Labs  Lab 11/28/18 2342 11/29/18 0401 11/29/18 0645 11/29/18 1112 11/29/18 1521  GLUCAP 213* 199* 165* 176* 161*      No results found for this or any previous visit (from the past 240 hour(s)).    Radiology Studies: Dg Chest Port 1 View  Result Date: 11/29/2018 CLINICAL DATA:  Acute respiratory failure. EXAM: PORTABLE CHEST 1 VIEW COMPARISON:  Chest radiograph 11/27/2018 FINDINGS: ET tube terminates in the mid trachea. Central venous catheter tip projects over the central left brachiocephalic vein. Enteric tube courses inferior to the diaphragm. Monitoring leads overlie the patient. Stable cardiac and mediastinal contours. Elevation right hemidiaphragm. Interval increase in left mid and lower lung and right lung base opacities. No pleural effusion or pneumothorax. IMPRESSION: Interval increase left mid and lower lung and right lung base opacities. Electronically Signed   By: Lovey Newcomer M.D.   On: 11/29/2018 11:35       LOS: 11 days   Author:  Berle Mull  [facility]  To reach On-call, see care teams to locate the attending  and reach out to them via [email or web address]. If 7PM-7AM, please contact night-coverage If you still have difficulty reaching the attending provider, please page the [facility] (Director on Call) for [facility] on amion for assistance.   11/29/2018, 4:35 PM

## 2018-11-29 NOTE — Progress Notes (Signed)
ANTICOAGULATION CONSULT NOTE - Follow Up Consult  Pharmacy Consult for Heparin Indication: atrial fibrillation  (Hx DVT on PTA Xarelto)  Allergies  Allergen Reactions  . Sulfonamide Derivatives Itching    Patient Measurements: Height: '5\' 3"'$  (160 cm) Weight: 255 lb 11.7 oz (116 kg) IBW/kg (Calculated) : 52.4 Heparin Dosing Weight: 80.5 kg  Vital Signs: Temp: 98.8 F (37.1 C) (08/02 0000) Temp Source: Oral (08/02 0000) BP: 138/72 (08/02 0300) Pulse Rate: 80 (08/02 0300)  Labs: Recent Labs    11/26/18 1525 11/26/18 1554 11/27/18 0450 11/28/18 0430 11/28/18 1030 11/28/18 1720 11/29/18 0306  HGB 11.4* 12.2 10.6* 9.6*  --   --   --   HCT 37.9 36.0 35.3* 31.8*  --   --   --   PLT 149*  --  139* 122*  --   --   --   APTT  --   --   --   --  22*  --   --   HEPARINUNFRC  --   --   --   --  <0.10* 0.25* 0.43  CREATININE 2.10*  --  2.15* 1.44*  --  1.23*  --     Estimated Creatinine Clearance: 53.8 mL/min (A) (by C-G formula based on SCr of 1.23 mg/dL (H)).   Medications:  Scheduled:  . chlorhexidine gluconate (MEDLINE KIT)  15 mL Mouth Rinse BID  . dexamethasone (DECADRON) injection  4 mg Intravenous Q24H  . feeding supplement (PRO-STAT SUGAR FREE 64)  30 mL Per Tube QID  . feeding supplement (VITAL AF 1.2 CAL)  1,000 mL Per Tube Q24H  . fentaNYL (SUBLIMAZE) injection  50 mcg Intravenous Once  . ferrous sulfate  300 mg Per Tube TID WC  . fluticasone furoate-vilanterol  1 puff Inhalation Daily  . free water  300 mL Per Tube Q6H  . gabapentin  100 mg Per Tube Q8H  . insulin aspart  0-20 Units Subcutaneous Q4H  . insulin aspart  8 Units Subcutaneous Q4H  . insulin detemir  45 Units Subcutaneous BID  . levothyroxine  112 mcg Oral Q0600  . mouth rinse  15 mL Mouth Rinse 10 times per day  . multivitamin with minerals  1 tablet Per Tube Daily  . pantoprazole sodium  40 mg Per Tube BID  . polyethylene glycol  17 g Per Tube Daily  . sodium zirconium cyclosilicate  10 g Oral  Daily  . vitamin C  500 mg Oral Daily  . zinc sulfate  220 mg Oral Daily   Infusions:  . sodium chloride Stopped (11/23/18 0159)  . amiodarone 30 mg/hr (11/29/18 0000)  . fentaNYL infusion INTRAVENOUS 125 mcg/hr (11/29/18 0000)  . heparin 800 Units/hr (11/29/18 0000)  . midazolam 1 mg/hr (11/29/18 0202)  . phenylephrine Stopped (11/28/18 1700)    Assessment: 22 yoF admitted on 7/22 with PMH of afib and DVT on chronic Xarelto.  She was transitioned to Heparin IV for possible chest tube (not needed), and resume Xarelto on 7/29.  She then had black stools on 7/30 and Xarelto was d/c (last dose on 7/29 at 1400).  Pharmacy is now consulted to resume Heparin IV.  11/29/2018 Back in afib. CBC: Hgb decreased further to 9.6, Plt decreased to 122 SCr 1.44 improved Note previously suprtherapeutic APTT and HL on heparin at 750 units/hr. Note previously reported minor oral bleeding (first noticed overnight 7/27) which appeared as dried, but does get minor ozzing/bleeding when giving mouthcare.  Source might be a tongue laceration.  RN today reports no s/s bleeding or complications.  Heparin level therapeutic at 0.43 after rate increase  Goal of Therapy:  Heparin level 0.3-0.7 units/ml Monitor platelets by anticoagulation protocol: Yes   Plan:  Continue heparin IV infusion at 800 units/hr Daily heparin level and CBC Continue to monitor H&H and platelets  Elenor Quinones, PharmD, BCPS, BCIDP Clinical Pharmacist 11/29/2018 4:56 AM

## 2018-11-29 NOTE — Progress Notes (Signed)
eLink Physician-Brief Progress Note Patient Name: [name] [name] DOB: 08/13/1950 MRN: [id number]   Date of Service  11/29/2018  HPI/Events of Note  Patient in afib RVR. Febrile off antibiotics. WBC trending down. Cultures obtained. CXR with increasing opacity on left. Swtiched earlier to [facility] 24/14/100%/10 PEEP TV 600s, peak pressure 27  eICU Interventions   Check ABG and electrolytes as patient diuresed >1 liter after furosemide given  Procalcitonin pending. Consider resumption of antibiotics if elevated  Cooling blanker ordered     Intervention Category Major Interventions: Arrhythmia - evaluation and management  Judd Lien 11/29/2018, 11:30 PM

## 2018-11-30 ENCOUNTER — Inpatient Hospital Stay (HOSPITAL_COMMUNITY): Payer: Medicare Other

## 2018-11-30 ENCOUNTER — Inpatient Hospital Stay (HOSPITAL_COMMUNITY): Payer: Self-pay

## 2018-11-30 ENCOUNTER — Inpatient Hospital Stay: Payer: Self-pay

## 2018-11-30 DIAGNOSIS — L8990 Pressure ulcer of unspecified site, unspecified stage: Secondary | ICD-10-CM | POA: Diagnosis present

## 2018-11-30 LAB — COMPREHENSIVE METABOLIC PANEL WITH GFR
ALT: 39 U/L (ref 0–44)
AST: 63 U/L — ABNORMAL HIGH (ref 15–41)
Albumin: 2.4 g/dL — ABNORMAL LOW (ref 3.5–5.0)
Alkaline Phosphatase: 75 U/L (ref 38–126)
Anion gap: 15 (ref 5–15)
BUN: 98 mg/dL — ABNORMAL HIGH (ref 8–23)
CO2: 32 mmol/L (ref 22–32)
Calcium: 7.5 mg/dL — ABNORMAL LOW (ref 8.9–10.3)
Chloride: 98 mmol/L (ref 98–111)
Creatinine, Ser: 1.43 mg/dL — ABNORMAL HIGH (ref 0.44–1.00)
GFR calc Af Amer: 44 mL/min — ABNORMAL LOW
GFR calc non Af Amer: 38 mL/min — ABNORMAL LOW
Glucose, Bld: 232 mg/dL — ABNORMAL HIGH (ref 70–99)
Potassium: 4.3 mmol/L (ref 3.5–5.1)
Sodium: 145 mmol/L (ref 135–145)
Total Bilirubin: 1.1 mg/dL (ref 0.3–1.2)
Total Protein: 4.9 g/dL — ABNORMAL LOW (ref 6.5–8.1)

## 2018-11-30 LAB — BLOOD CULTURE ID PANEL (REFLEXED)

## 2018-11-30 LAB — CBC WITH DIFFERENTIAL/PLATELET
Abs Immature Granulocytes: 0.11 10*3/uL — ABNORMAL HIGH (ref 0.00–0.07)
Basophils Absolute: 0.1 10*3/uL (ref 0.0–0.1)
Basophils Relative: 1 %
Eosinophils Absolute: 0 10*3/uL (ref 0.0–0.5)
Eosinophils Relative: 0 %
HCT: 32.7 % — ABNORMAL LOW (ref 36.0–46.0)
Hemoglobin: 10.1 g/dL — ABNORMAL LOW (ref 12.0–15.0)
Immature Granulocytes: 1 %
Lymphocytes Relative: 4 %
Lymphs Abs: 0.5 10*3/uL — ABNORMAL LOW (ref 0.7–4.0)
MCH: 28.1 pg (ref 26.0–34.0)
MCHC: 30.9 g/dL (ref 30.0–36.0)
MCV: 91.1 fL (ref 80.0–100.0)
Monocytes Absolute: 0.4 10*3/uL (ref 0.1–1.0)
Monocytes Relative: 3 %
Neutro Abs: 12.5 10*3/uL — ABNORMAL HIGH (ref 1.7–7.7)
Neutrophils Relative %: 91 %
Platelets: 94 10*3/uL — ABNORMAL LOW (ref 150–400)
RBC: 3.59 MIL/uL — ABNORMAL LOW (ref 3.87–5.11)
RDW: 14 % (ref 11.5–15.5)
WBC: 13.6 10*3/uL — ABNORMAL HIGH (ref 4.0–10.5)
nRBC: 19.5 % — ABNORMAL HIGH (ref 0.0–0.2)

## 2018-11-30 LAB — GLUCOSE, CAPILLARY
Glucose-Capillary: 183 mg/dL — ABNORMAL HIGH (ref 70–99)
Glucose-Capillary: 201 mg/dL — ABNORMAL HIGH (ref 70–99)
Glucose-Capillary: 202 mg/dL — ABNORMAL HIGH (ref 70–99)
Glucose-Capillary: 208 mg/dL — ABNORMAL HIGH (ref 70–99)
Glucose-Capillary: 210 mg/dL — ABNORMAL HIGH (ref 70–99)
Glucose-Capillary: 210 mg/dL — ABNORMAL HIGH (ref 70–99)

## 2018-11-30 LAB — URINALYSIS, MICROSCOPIC (REFLEX)

## 2018-11-30 LAB — BASIC METABOLIC PANEL
Anion gap: 14 (ref 5–15)
BUN: 91 mg/dL — ABNORMAL HIGH (ref 8–23)
CO2: 32 mmol/L (ref 22–32)
Calcium: 7.7 mg/dL — ABNORMAL LOW (ref 8.9–10.3)
Chloride: 100 mmol/L (ref 98–111)
Creatinine, Ser: 1.3 mg/dL — ABNORMAL HIGH (ref 0.44–1.00)
GFR calc Af Amer: 49 mL/min — ABNORMAL LOW (ref >60)
GFR calc non Af Amer: 42 mL/min — ABNORMAL LOW (ref >60)
Glucose, Bld: 214 mg/dL — ABNORMAL HIGH (ref 70–99)
Potassium: 4.5 mmol/L (ref 3.5–5.1)
Sodium: 146 mmol/L — ABNORMAL HIGH (ref 135–145)

## 2018-11-30 LAB — D-DIMER, QUANTITATIVE: D-Dimer, Quant: 15.28 ug/mL-FEU — ABNORMAL HIGH (ref 0.00–0.50)

## 2018-11-30 LAB — URINALYSIS, ROUTINE W REFLEX MICROSCOPIC
Bilirubin Urine: NEGATIVE
Glucose, UA: NEGATIVE mg/dL
Ketones, ur: NEGATIVE mg/dL
Nitrite: NEGATIVE
Specific Gravity, Urine: <1.005 — ABNORMAL LOW (ref 1.005–1.030)
pH: >9 — ABNORMAL HIGH (ref 5.0–8.0)

## 2018-11-30 LAB — POCT I-STAT 7, (LYTES, BLD GAS, ICA,H+H)
Acid-Base Excess: 10 mmol/L — ABNORMAL HIGH (ref 0.0–2.0)
Bicarbonate: 34.4 mmol/L — ABNORMAL HIGH (ref 20.0–28.0)
Calcium, Ion: 1.04 mmol/L — ABNORMAL LOW (ref 1.15–1.40)
HCT: 32 % — ABNORMAL LOW (ref 36.0–46.0)
Hemoglobin: 10.9 g/dL — ABNORMAL LOW (ref 12.0–15.0)
O2 Saturation: 90 %
Patient temperature: 99.6
Potassium: 4.5 mmol/L (ref 3.5–5.1)
Sodium: 142 mmol/L (ref 135–145)
TCO2: 36 mmol/L — ABNORMAL HIGH (ref 22–32)
pCO2 arterial: 46.4 mmHg (ref 32.0–48.0)
pH, Arterial: 7.48 — ABNORMAL HIGH (ref 7.350–7.450)
pO2, Arterial: 57 mmHg — ABNORMAL LOW (ref 83.0–108.0)

## 2018-11-30 LAB — MAGNESIUM: Magnesium: 2.6 mg/dL — ABNORMAL HIGH (ref 1.7–2.4)

## 2018-11-30 LAB — LACTATE DEHYDROGENASE: LDH: 1097 U/L — ABNORMAL HIGH (ref 98–192)

## 2018-11-30 LAB — PATHOLOGIST SMEAR REVIEW

## 2018-11-30 LAB — PHOSPHORUS: Phosphorus: 3.2 mg/dL (ref 2.5–4.6)

## 2018-11-30 LAB — BRAIN NATRIURETIC PEPTIDE: B Natriuretic Peptide: 226.6 pg/mL — ABNORMAL HIGH (ref 0.0–100.0)

## 2018-11-30 LAB — HEPARIN LEVEL (UNFRACTIONATED): Heparin Unfractionated: 0.53 IU/mL (ref 0.30–0.70)

## 2018-11-30 LAB — C-REACTIVE PROTEIN: CRP: <0.8 mg/dL

## 2018-11-30 LAB — FERRITIN: Ferritin: 214 ng/mL (ref 11–307)

## 2018-11-30 LAB — PROCALCITONIN: Procalcitonin: 0.26 ng/mL

## 2018-11-30 MED ORDER — SODIUM CHLORIDE 0.9% FLUSH: 10.0000 mL | INTRAVENOUS | Status: DC | PRN

## 2018-11-30 MED ORDER — DILTIAZEM HCL-DEXTROSE 100-5 MG/100ML-% IV SOLN (PREMIX)
5.0000 mg/h | INTRAVENOUS | Status: DC
  Administered 2018-11-30: 5 mg/h via INTRAVENOUS
  Filled 2018-11-30 (×2): qty 100

## 2018-11-30 MED ORDER — OXYCODONE HCL 5 MG/5ML PO SOLN
5.0000 mg | Freq: Four times a day (QID) | ORAL | Status: DC
  Administered 2018-11-30 – 2018-12-03 (×12): 5 mg
  Filled 2018-11-30 (×12): qty 5

## 2018-11-30 MED ORDER — DIGOXIN 0.25 MG/ML IJ SOLN
0.2500 mg | Freq: Once | INTRAMUSCULAR | Status: AC
  Administered 2018-11-30: 0.25 mg via INTRAVENOUS
  Filled 2018-11-30: qty 1

## 2018-11-30 MED ORDER — CLONAZEPAM 1 MG PO TABS
1.0000 mg | ORAL_TABLET | Freq: Two times a day (BID) | ORAL | Status: DC
  Administered 2018-11-30 – 2018-12-03 (×7): 1 mg via ORAL
  Filled 2018-11-30 (×4): qty 1
  Filled 2018-11-30: qty 2
  Filled 2018-11-30 (×2): qty 1

## 2018-11-30 MED ORDER — VANCOMYCIN HCL 10 G IV SOLR
2500.0000 mg | Freq: Once | INTRAVENOUS | Status: AC
  Administered 2018-11-30: 2500 mg via INTRAVENOUS
  Filled 2018-11-30: qty 2000

## 2018-11-30 MED ORDER — METOPROLOL TARTRATE 5 MG/5ML IV SOLN
2.5000 mg | Freq: Four times a day (QID) | INTRAVENOUS | Status: DC
  Administered 2018-11-30 – 2018-12-06 (×24): 2.5 mg via INTRAVENOUS
  Filled 2018-11-30 (×22): qty 5

## 2018-11-30 MED ORDER — AMIODARONE LOAD VIA INFUSION
150.0000 mg | Freq: Once | INTRAVENOUS | Status: AC
  Administered 2018-11-30: 09:00:00 150 mg via INTRAVENOUS
  Filled 2018-11-30: qty 83.34

## 2018-11-30 MED ORDER — AMIODARONE HCL IN DEXTROSE 360-4.14 MG/200ML-% IV SOLN
60.0000 mg/h | INTRAVENOUS | Status: DC
  Administered 2018-11-30 (×2): 60 mg/h via INTRAVENOUS
  Filled 2018-11-30: qty 200

## 2018-11-30 MED ORDER — DIGOXIN 0.25 MG/ML IJ SOLN
0.1250 mg | Freq: Every day | INTRAMUSCULAR | Status: DC
  Administered 2018-12-01 – 2018-12-09 (×9): 0.125 mg via INTRAVENOUS
  Filled 2018-11-30 (×10): qty 0.5

## 2018-11-30 MED ORDER — AMIODARONE HCL IN DEXTROSE 360-4.14 MG/200ML-% IV SOLN
30.0000 mg/h | INTRAVENOUS | Status: DC
  Administered 2018-12-01 – 2018-12-06 (×10): 30 mg/h via INTRAVENOUS
  Filled 2018-11-30 (×12): qty 200

## 2018-11-30 MED ORDER — VITAMIN C 500 MG PO TABS
500.0000 mg | ORAL_TABLET | Freq: Every day | ORAL | Status: DC
  Administered 2018-12-01 – 2018-12-09 (×9): 500 mg
  Filled 2018-11-30 (×9): qty 1

## 2018-11-30 MED ORDER — VANCOMYCIN HCL 10 G IV SOLR
1750.0000 mg | INTRAVENOUS | Status: DC
  Filled 2018-11-30: qty 1750

## 2018-11-30 MED ORDER — PHENYLEPHRINE HCL-NACL 10-0.9 MG/250ML-% IV SOLN: 0.0000 ug/min | INTRAVENOUS | Status: DC

## 2018-11-30 MED ORDER — CHLORHEXIDINE GLUCONATE 0.12 % MT SOLN
15.0000 mL | Freq: Two times a day (BID) | OROMUCOSAL | Status: DC
  Administered 2018-11-30 – 2018-12-09 (×18): 15 mL via OROMUCOSAL
  Filled 2018-11-30 (×6): qty 15

## 2018-11-30 MED ORDER — SODIUM CHLORIDE 0.9% FLUSH
10.0000 mL | Freq: Two times a day (BID) | INTRAVENOUS | Status: DC
  Administered 2018-12-01 (×2): 10 mL
  Administered 2018-12-02: 30 mL
  Administered 2018-12-02: 08:00:00 10 mL
  Administered 2018-12-03: 40 mL
  Administered 2018-12-03 – 2018-12-06 (×7): 10 mL
  Administered 2018-12-08: 30 mL
  Administered 2018-12-08 – 2018-12-09 (×2): 10 mL

## 2018-11-30 MED ORDER — CLONAZEPAM 0.1 MG/ML ORAL SUSPENSION: 1.0000 mg | Freq: Two times a day (BID) | ORAL | Status: DC

## 2018-11-30 MED ORDER — ZINC SULFATE 220 (50 ZN) MG PO CAPS
220.0000 mg | ORAL_CAPSULE | Freq: Every day | ORAL | Status: DC
  Administered 2018-12-01 – 2018-12-09 (×8): 220 mg
  Filled 2018-11-30 (×8): qty 1

## 2018-11-30 MED ORDER — PHENYLEPHRINE HCL-NACL 40-0.9 MG/250ML-% IV SOLN
0.0000 ug/min | INTRAVENOUS | Status: DC
  Administered 2018-11-30: 20 ug/min via INTRAVENOUS
  Administered 2018-12-03: 100 ug/min via INTRAVENOUS
  Administered 2018-12-03: 20 ug/min via INTRAVENOUS
  Administered 2018-12-03: 23:00:00 90 ug/min via INTRAVENOUS
  Administered 2018-12-04: 20 ug/min via INTRAVENOUS
  Administered 2018-12-06 – 2018-12-07 (×3): 25 ug/min via INTRAVENOUS
  Administered 2018-12-08: 90 ug/min via INTRAVENOUS
  Filled 2018-11-30 (×7): qty 250

## 2018-11-30 NOTE — Progress Notes (Signed)
Assisted daughter with camera/video time via elink 

## 2018-11-30 NOTE — Progress Notes (Signed)
Pt temp was 101.0 tylenol was given Temp was rechecked and was 102.4 Md was notified and placed orders. Will continue to monitor pt.

## 2018-11-30 NOTE — Progress Notes (Signed)
ANTICOAGULATION CONSULT NOTE - Follow Up Consult  Pharmacy Consult for Heparin Indication: atrial fibrillation  (Hx DVT on PTA Xarelto)  Allergies  Allergen Reactions  . Sulfonamide Derivatives Itching    Patient Measurements: Height: '5\' 3"'$  (160 cm) Weight: 255 lb 1.2 oz (115.7 kg) IBW/kg (Calculated) : 52.4 Heparin Dosing Weight: 80.5 kg  Vital Signs: Temp: 102.4 F (39.1 C) (08/02 2115) Temp Source: Axillary (08/02 2115) BP: 111/79 (08/03 0400) Pulse Rate: 156 (08/03 0400)  Labs: Recent Labs    11/27/18 0450 11/28/18 0430  11/28/18 1030 11/28/18 1720 11/29/18 0306 11/29/18 0435 11/29/18 2230 11/30/18 0155 11/30/18 0227  HGB 10.6* 9.6*  --   --   --   --  9.3*  --   --  10.9*  HCT 35.3* 31.8*  --   --   --   --  31.3*  --   --  32.0*  PLT 139* 122*  --   --   --   --  PLATELET CLUMPS NOTED ON SMEAR, UNABLE TO ESTIMATE  --   --   --   APTT  --   --   --  22*  --   --   --   --   --   --   HEPARINUNFRC  --   --    < > <0.10* 0.25* 0.43  --   --  0.53  --   CREATININE 2.15* 1.44*  --   --  1.23*  --  1.22* 1.30*  --   --    < > = values in this interval not displayed.    Estimated Creatinine Clearance: 50.8 mL/min (A) (by C-G formula based on SCr of 1.3 mg/dL (H)).   Medications:  Scheduled:  . [START ON 12/06/2018] amiodarone  200 mg Oral BID  . amiodarone  400 mg Oral BID  . chlorhexidine gluconate (MEDLINE KIT)  15 mL Mouth Rinse BID  . dexamethasone (DECADRON) injection  4 mg Intravenous Q24H  . feeding supplement (PRO-STAT SUGAR FREE 64)  30 mL Per Tube QID  . feeding supplement (VITAL AF 1.2 CAL)  1,000 mL Per Tube Q24H  . fentaNYL (SUBLIMAZE) injection  50 mcg Intravenous Once  . ferrous sulfate  300 mg Per Tube TID WC  . fluticasone furoate-vilanterol  1 puff Inhalation Daily  . free water  300 mL Per Tube Q6H  . gabapentin  100 mg Per Tube Q8H  . insulin aspart  0-20 Units Subcutaneous Q4H  . insulin aspart  10 Units Subcutaneous Q4H  . insulin  detemir  50 Units Subcutaneous BID  . levothyroxine  112 mcg Oral Q0600  . mouth rinse  15 mL Mouth Rinse 10 times per day  . multivitamin with minerals  1 tablet Per Tube Daily  . pantoprazole sodium  40 mg Per Tube BID  . polyethylene glycol  17 g Per Tube Daily  . vitamin C  500 mg Oral Daily  . zinc sulfate  220 mg Oral Daily   Infusions:  . sodium chloride 10 mL/hr at 11/30/18 0400  . diltiazem (CARDIZEM) infusion 5 mg/hr (11/30/18 0400)  . fentaNYL infusion INTRAVENOUS 200 mcg/hr (11/30/18 0400)  . heparin 800 Units/hr (11/30/18 0400)  . midazolam 3 mg/hr (11/30/18 0400)  . phenylephrine Stopped (11/28/18 1700)    Assessment: 4 yoF admitted on 7/22 with PMH of afib and DVT on chronic Xarelto.  She was transitioned to Heparin IV for possible chest tube (not needed), and resume Xarelto  on 7/29.  She then had black stools on 7/30 and Xarelto was d/c (last dose on 7/29 at 1400).  Pharmacy is now consulted to resume Heparin IV.  Heparin switched to peripheral line. Heparin level remains therapeutic at 0.53 after rate increase  Goal of Therapy:  Heparin level 0.3-0.7 units/ml Monitor platelets by anticoagulation protocol: Yes   Plan:  Continue heparin IV infusion at 800 units/hr Daily heparin level and CBC Continue to monitor H&H and platelets  Elenor Quinones, PharmD, BCPS, BCIDP Clinical Pharmacist 11/30/2018 4:32 AM

## 2018-11-30 NOTE — Progress Notes (Signed)
Pt had ABG ordered and was obtained later than ordered.  At this time, RT does not feel a new ABG is necessary at this time.  RT will monitor.

## 2018-11-30 NOTE — Progress Notes (Signed)
Pt HR is 180-190's Lopressor given at 2213 per orders  Amiodarone given at 2222 per orders  MD notified that pt HR is still elevated in the 150-160's. Md will place additional orders. Will continue to monitor.

## 2018-11-30 NOTE — Progress Notes (Signed)
Pharmacy Antibiotic Note  [name] [name] is a 68 y.o. female admitted on 11/11/2018 with MRSA bacteremia.  Pharmacy has been consulted for vancomycin dosing. WBC 13.6 with new vasopressor requirement.   Plan: -vancomycin 2500 mg IV once, then start Vancomycin 1750 mg IV Q 48 hrs. Goal AUC 400-550. Expected AUC: 486 SCr used: 1.43   Height: 5\' 3"  (160 cm) Weight: 263 lb 10.7 oz (119.6 kg) IBW/kg (Calculated) : 52.4  Temp (24hrs), Avg:99.8 F (37.7 C), Min:97.6 F (36.4 C), Max:102.4 F (39.1 C)  Recent Labs  Lab 11/26/18 1525 11/27/18 0450 11/28/18 0430 11/28/18 1720 11/29/18 0435 11/29/18 2230 11/30/18 0457  WBC 28.3* 27.5* 23.6*  --  14.5*  --  13.6*  CREATININE 2.10* 2.15* 1.44* 1.23* 1.22* 1.30* 1.43*    Estimated Creatinine Clearance: 47.1 mL/min (A) (by C-G formula based on SCr of 1.43 mg/dL (H)).    Allergies  Allergen Reactions  . Sulfonamide Derivatives Itching    Antimicrobials this admission: Vancomycin 8/3 >>   Dose adjustments this admission: None   Microbiology results: 8/2 BCx2: 4/4 GPC's in clusters (BCID shows MRSA)  Thank you for allowing pharmacy to be a part of this patient's care.  Albertina Parr, PharmD., BCPS Clinical Pharmacist Clinical phone for 11/30/18 until 5pm: [phone]

## 2018-11-30 NOTE — Progress Notes (Signed)
Phone call consult to primary service Asked about patients PAF Offered to come to [facility] and see indicated not necessary  Recurrent PAF EF normal by Echo 65% Baseline ECG RBBB normal QT No history of CAD On vent may be chronic  Discussed rebolus amiodarone and continuing iv drip While on vent and not changing to oral Also loading with iv digoxin 0.25 mg q4 hours x 3 than 0.25 mg daily  She is on oral xaralto for anticoagulation   [name] [name]

## 2018-11-30 NOTE — Progress Notes (Signed)
NAME:  [name] [name], MRN:  [id number], DOB:  10-Oct-1950, LOS: 28 ADMISSION DATE:  11/03/2018, CONSULTATION DATE:  July 23 REFERRING MD:  Candiss Norse, CHIEF COMPLAINT:  dyspnea   Brief History   68 year old female admitted for severe acute respiratory failure with hypoxemia due to COVID-19 pneumonia  Past Medical History  Diabetes mellitus type 2 Obesity Asthma, has been on inhalers in the past, never smoked cigarettes CKD stage III Chronic A. fib History of spine surgery History of Graves' disease, now hypothyroid History of stroke History of DVT Lives in an assisted living facility [facility] Events   July 22 admitted July 23 intubated July 24 prone positioning July 26 subcutaneous air, no pneumothorax, afib with RVR again July 29 decreasing sedation, diuresis July 30 decreasing sedation July 31 holding additional diuresis 8/2 weaning on pressure support  Consults:  PCCM  Procedures:  7/23 ETT > 7/23 L IJ CVL >  Significant Diagnostic Tests:  7/24 Echo > LVEF > 65%, impaired relaxation, RV normal systolic function, mild thickening of aortic valve  Micro Data:  July 22 SARS COV 2 Positive  Antimicrobials:  July 22 remdesivir> 7/26 July 22 decadron July 22> 23 Toclizumab   Interim history/subjective:  Fever, afib with RVR overnight   Objective   Blood pressure 122/71, pulse (!) 152, temperature 99.6 F (37.6 C), temperature source Oral, resp. rate (!) 27, height 5\' 3"  (1.6 m), weight 119.6 kg, SpO2 (!) 87 %. CVP:  [2 mmHg-13 mmHg] 11 mmHg  Vent Mode: PCV FiO2 (%):  [55 %-70 %] 70 % Set Rate:  [24 bmp] 24 bmp PEEP:  [10 cmH20] 10 cmH20 Pressure Support:  [10 cmH20] 10 cmH20 Plateau Pressure:  [18 cmH20-25 cmH20] 25 cmH20   Intake/Output Summary (Last 24 hours) at 11/30/2018 0743 Last data filed at 11/30/2018 0600 Gross per 24 hour  Intake 1862.29 ml  Output 2590 ml  Net -727.71 ml   Filed Weights   11/28/18 0400 11/29/18 0500 11/30/18  0500  Weight: 116 kg 115.7 kg 119.6 kg    Examination:  General:  In bed on vent HENT: NCAT ETT in place PULM: CTA B, vent supported breathing CV: RRR, no mgr GI: BS+, soft, nontender MSK: normal bulk and tone Neuro: sedated on vent   8/3 CXR images independently reviewed: bibasilar infiltrates, no clear pneumomediastinum  Resolved Hospital Problem list    Assessment & Plan:  ARDS due to COVID-19 pneumonia 8/3 plan: No pressure support weaning today given Afib with RVR, hold diuresis Continue mechanical ventilation per ARDS protocol Target TVol 6-8cc/kgIBW Target Plateau Pressure < 30cm H20 Target driving pressure less than 15 cm of water Target PaO2 55-65: titrate PEEP/FiO2 per protocol As long as PaO2 to FiO2 ratio is less than 1:150 position in prone position for 16 hours a day Check CVP daily if CVL in place Target CVP less than 4, diurese as necessary Ventilator associated pneumonia prevention protocol  Pneumomediastinum: stable on 8/3 CXR Monitor for hemodynamic change  Fever: no change in WBC, otherwise improving slowly, doubt sepsis, likely due to fibroproliferative ARDS Monitor clinically  Ventricular tachycardia noted on July 23' Atrial fib with RVR > improved again on 8/2 Tele Amiodarone bolus and infusion again Adding dig Digoxin consult Add scheduled metoprolol Heparin infusion  Need for sedation for mechanical ventilation/dyssynchrony RASS target -2 PAD protocol: fentanyl/versed infusion Add oxycodone and clonazepam  Other: Remove CVL Place PICC  Best practice:  Diet: Tube feeding Pain/Anxiety/Delirium protocol (if indicated): as above  VAP protocol (if indicated): yes DVT prophylaxis: heparin infusion GI prophylaxis: Pantoprazole for stress ulcer prophylaxis Glucose control: SSI Mobility: bed rest Code Status: limited code Family Communication: will discuss with TRH Disposition: remian in ICU  Labs   CBC: Recent Labs  Lab 11/26/18  0548 11/26/18 1525 11/26/18 1554 11/27/18 0450 11/28/18 0430 11/29/18 0435 11/30/18 0227  WBC 20.4* 28.3*  --  27.5* 23.6* 14.5*  --   NEUTROABS  --   --   --   --  19.9* 11.9*  --   HGB 11.5* 11.4* 12.2 10.6* 9.6* 9.3* 10.9*  HCT 39.6 37.9 36.0 35.3* 31.8* 31.3* 32.0*  MCV 92.3 91.1  --  91.0 90.3 90.2  --   PLT 139* 149*  --  139* 122* PLATELET CLUMPS NOTED ON SMEAR, UNABLE TO ESTIMATE  --     Basic Metabolic Panel: Recent Labs  Lab 11/26/18 1525  11/27/18 0450 11/28/18 0430 11/28/18 1720 11/29/18 0435 11/29/18 2230 11/30/18 0227 11/30/18 0457  NA 149*   < > 151* 148* 145 145 146* 142 145  K 5.8*   < > 5.3* 4.4 4.3 3.9 4.5 4.5 4.3  CL 108  --  109 106 101 102 100  --  98  CO2 30  --  32 34* 31 34* 32  --  32  GLUCOSE 227*  --  180* 288* 327* 204* 214*  --  232*  BUN 150*  --  149* 114* 95* 89* 91*  --  98*  CREATININE 2.10*  --  2.15* 1.44* 1.23* 1.22* 1.30*  --  1.43*  CALCIUM 7.9*  --  7.8* 7.6* 7.2* 7.6* 7.7*  --  7.5*  MG 3.0*  --  3.1* 2.7*  --  2.7*  --   --  2.6*  PHOS  --   --   --   --   --   --  3.2  --   --    < > = values in this interval not displayed.   GFR: Estimated Creatinine Clearance: 47.1 mL/min (A) (by C-G formula based on SCr of 1.43 mg/dL (H)). Recent Labs  Lab 11/26/18 1525 11/27/18 0450 11/28/18 0430 11/29/18 0435 11/29/18 2255  PROCALCITON  --   --   --   --  0.26  WBC 28.3* 27.5* 23.6* 14.5*  --     Liver Function Tests: Recent Labs  Lab 11/27/18 0450 11/28/18 0430 11/29/18 0435 11/30/18 0457  AST 48* 57* 57* 63*  ALT 36 37 33 39  ALKPHOS 74 77 72 75  BILITOT 0.9 0.7 0.8 1.1  PROT 5.2* 4.9* 5.0* 4.9*  ALBUMIN 2.4* 2.3* 2.3* 2.4*   No results for input(s): LIPASE, AMYLASE in the last 168 hours. No results for input(s): AMMONIA in the last 168 hours.  ABG    Component Value Date/Time   PHART 7.480 (H) 11/30/2018 0227   PCO2ART 46.4 11/30/2018 0227   PO2ART 57.0 (L) 11/30/2018 0227   HCO3 34.4 (H) 11/30/2018 0227    TCO2 36 (H) 11/30/2018 0227   ACIDBASEDEF 3.0 (H) 11/21/2018 0628   O2SAT 90.0 11/30/2018 0227     Coagulation Profile: No results for input(s): INR, PROTIME in the last 168 hours.  Cardiac Enzymes: No results for input(s): CKTOTAL, CKMB, CKMBINDEX, TROPONINI in the last 168 hours.  HbA1C: Hgb A1c MFr Bld  Date/Time Value Ref Range Status  11/08/2018 08:00 PM 7.0 (H) 4.8 - 5.6 % Final    Comment:    (NOTE) Pre  diabetes:          5.7%-6.4% Diabetes:              >6.4% Glycemic control for   <7.0% adults with diabetes   10/18/2016 10:47 AM 6.0 (H) 4.8 - 5.6 % Final    Comment:    (NOTE)         Pre-diabetes: 5.7 - 6.4         Diabetes: >6.4         Glycemic control for adults with diabetes: <7.0     CBG: Recent Labs  Lab 11/29/18 1112 11/29/18 1521 11/29/18 1947 11/29/18 2357 11/30/18 0440  GLUCAP 176* 161* 194* 202* 210*    Critical care time: 35 minutes    Roselie Awkward, MD [place] [facility] Pager: [phone] Cell: [phone] If no response, call [phone]

## 2018-11-30 NOTE — Consult Note (Signed)
[facility]  Total days of antibiotics 1/vancomycin (though did receive remdesivir)               Reason for Consult: MRSA bacteremia   Referring Physician: auto consult/pranav patel   Active Problems:   Acute respiratory failure with hypoxemia (HCC)   Pneumonia due to COVID-19 virus   SOB (shortness of breath)   Ventricular tachyarrhythmia (HCC)   Intubation of airway performed without difficulty   Subcutaneous emphysema (HCC)   Pneumomediastinum (HCC)   Pressure injury of skin    HPI: CAIDEN MONSIVAIS is a 68 y.o. female with morbid obesity, COPD, HTN, CKD 3,, AFIB, severe covid-19 disease s/p intubation, c/b pneumomediastinum, admitted on 7/22 but recently had fever on 8/2 which prompted culture - found to have mrsa bacteremia. Has numerous lines in place. Central line, picc line have been removed today. Currently on vancomycin- renally dosed.   Past Medical History:  Diagnosis Date  . Arthritis   . Carpal tunnel syndrome   . Cataract   . Depression   . Esophagitis   . GERD (gastroesophageal reflux disease)   . Graves disease   . History of DVT (deep vein thrombosis)   . Hypertension   . Hypothyroidism   . MI (myocardial infarction) ([place])   . Mood disorder ([place])   . Peptic ulcer disease   . Pre-diabetes   . Stroke ([place])   . Thyroid disease   . Venous insufficiency   . Vitamin D deficiency     Allergies:  Allergies  Allergen Reactions  . Sulfonamide Derivatives Itching    Current antibiotics:   MEDICATIONS: . chlorhexidine  15 mL Mouth/Throat BID  . clonazePAM  1 mg Oral BID  . dexamethasone (DECADRON) injection  4 mg Intravenous Q24H  . [START ON 12/01/2018] digoxin  0.125 mg Intravenous Daily  . feeding supplement (PRO-STAT SUGAR FREE 64)  30 mL Per Tube QID  . feeding supplement (VITAL AF 1.2 CAL)  1,000 mL Per Tube Q24H  . fentaNYL (SUBLIMAZE) injection  50 mcg Intravenous Once  . ferrous sulfate  300 mg Per Tube TID WC  .  free water  300 mL Per Tube Q6H  . gabapentin  100 mg Per Tube Q8H  . insulin aspart  0-20 Units Subcutaneous Q4H  . insulin aspart  10 Units Subcutaneous Q4H  . insulin detemir  50 Units Subcutaneous BID  . levothyroxine  112 mcg Oral Q0600  . mouth rinse  15 mL Mouth Rinse 10 times per day  . metoprolol tartrate  2.5 mg Intravenous Q6H  . multivitamin with minerals  1 tablet Per Tube Daily  . oxyCODONE  5 mg Per Tube Q6H  . pantoprazole sodium  40 mg Per Tube BID  . polyethylene glycol  17 g Per Tube Daily  . [START ON 12/01/2018] vitamin C  500 mg Per Tube Daily  . [START ON 12/01/2018] zinc sulfate  220 mg Per Tube Daily    Social History   Tobacco Use  . Smoking status: Never Smoker  . Smokeless tobacco: Never Used  Substance Use Topics  . Alcohol use: No  . Drug use: No    Family History  Problem Relation Age of Onset  . Hypertension Mother   . Hypertension Father   . Colon cancer Neg Hx   . Esophageal cancer Neg Hx   . Rectal cancer Neg Hx   . Stomach cancer Neg Hx     Review  of Systems -unable to obtain due to intubation and sedation   OBJECTIVE: Temp:  [97.6 F (36.4 C)-102.4 F (39.1 C)] 101.7 F (38.7 C) (08/03 1545) Pulse Rate:  [37-165] 97 (08/03 1800) Resp:  [19-39] 26 (08/03 1800) BP: (72-158)/(29-124) 113/63 (08/03 1700) SpO2:  [85 %-100 %] 100 % (08/03 1800) FiO2 (%):  [55 %-70 %] 60 % (08/03 1524) Weight:  [119.6 kg] 119.6 kg (08/03 0500) Did not examine LABS: Results for orders placed or performed during the hospital encounter of 11/08/2018 (from the past 48 hour(s))  Glucose, capillary     Status: Abnormal   Collection Time: 11/28/18  8:04 PM  Result Value Ref Range   Glucose-Capillary 223 (H) 70 - 99 mg/dL  Glucose, capillary     Status: Abnormal   Collection Time: 11/28/18 11:42 PM  Result Value Ref Range   Glucose-Capillary 213 (H) 70 - 99 mg/dL  Heparin level (unfractionated)     Status: None   Collection Time: 11/29/18  3:06 AM  Result  Value Ref Range   Heparin Unfractionated 0.43 0.30 - 0.70 IU/mL    Comment: (NOTE) If heparin results are below expected values, and patient dosage has  been confirmed, suggest follow up testing of antithrombin III levels. Performed at [facility], [place] [address] [place], [place] [address]   Glucose, capillary     Status: Abnormal   Collection Time: 11/29/18  4:01 AM  Result Value Ref Range   Glucose-Capillary 199 (H) 70 - 99 mg/dL  Brain natriuretic peptide     Status: Abnormal   Collection Time: 11/29/18  4:35 AM  Result Value Ref Range   B Natriuretic Peptide 132.0 (H) 0.0 - 100.0 pg/mL    Comment: Performed at [facility], [place] [address], [place], [place] [address]  CBC with Differential/Platelet     Status: Abnormal   Collection Time: 11/29/18  4:35 AM  Result Value Ref Range   WBC 14.5 (H) 4.0 - 10.5 K/uL   RBC 3.47 (L) 3.87 - 5.11 MIL/uL   Hemoglobin 9.3 (L) 12.0 - 15.0 g/dL   HCT 31.3 (L) 36.0 - 46.0 %   MCV 90.2 80.0 - 100.0 fL   MCH 26.8 26.0 - 34.0 pg   MCHC 29.7 (L) 30.0 - 36.0 g/dL   RDW 13.7 11.5 - 15.5 %   Platelets PLATELET CLUMPS NOTED ON SMEAR, UNABLE TO ESTIMATE 150 - 400 K/uL    Comment: PLATELET CLUMPS NOTED ON SMEAR, UNABLE TO ESTIMATE Immature Platelet Fraction may be clinically indicated, consider ordering this additional test [id number]    nRBC 9.9 (H) 0.0 - 0.2 %   Neutrophils Relative % 83 %   Neutro Abs 11.9 (H) 1.7 - 7.7 K/uL   Lymphocytes Relative 6 %   Lymphs Abs 0.9 0.7 - 4.0 K/uL   Monocytes Relative 6 %   Monocytes Absolute 0.9 0.1 - 1.0 K/uL   Eosinophils Relative 0 %   Eosinophils Absolute 0.0 0.0 - 0.5 K/uL   Basophils Relative 0 %   Basophils Absolute 0.1 0.0 - 0.1 K/uL   Immature Granulocytes 5 %   Abs Immature Granulocytes 0.75 (H) 0.00 - 0.07 K/uL   Polychromasia PRESENT     Comment: Performed at [facility], [place] [address], [place], [place] [address]  Comprehensive  metabolic panel     Status: Abnormal   Collection Time: 11/29/18  4:35 AM  Result Value Ref Range   Sodium 145 135 - 145  mmol/L   Potassium 3.9 3.5 - 5.1 mmol/L   Chloride 102 98 - 111 mmol/L   CO2 34 (H) 22 - 32 mmol/L   Glucose, Bld 204 (H) 70 - 99 mg/dL   BUN 89 (H) 8 - 23 mg/dL   Creatinine, Ser 1.22 (H) 0.44 - 1.00 mg/dL   Calcium 7.6 (L) 8.9 - 10.3 mg/dL   Total Protein 5.0 (L) 6.5 - 8.1 g/dL   Albumin 2.3 (L) 3.5 - 5.0 g/dL   AST 57 (H) 15 - 41 U/L   ALT 33 0 - 44 U/L   Alkaline Phosphatase 72 38 - 126 U/L   Total Bilirubin 0.8 0.3 - 1.2 mg/dL   GFR calc non Af Amer 45 (L) >60 mL/min   GFR calc Af Amer 53 (L) >60 mL/min   Anion gap 9 5 - 15    Comment: Performed at [facility], [place] [address], [place], [place] [address]  C-reactive protein     Status: None   Collection Time: 11/29/18  4:35 AM  Result Value Ref Range   CRP <0.8 <1.0 mg/dL    Comment: Performed at [facility], [place] [address] [place], [place] [address]  Magnesium     Status: Abnormal   Collection Time: 11/29/18  4:35 AM  Result Value Ref Range   Magnesium 2.7 (H) 1.7 - 2.4 mg/dL    Comment: Performed at [facility], [place] [address] [place], [place] [address]  Lactate dehydrogenase     Status: Abnormal   Collection Time: 11/29/18  4:35 AM  Result Value Ref Range   LDH 918 (H) 98 - 192 U/L    Comment: Performed at [facility], [place] [address], [place], [place] [address]  Ferritin     Status: None   Collection Time: 11/29/18  4:35 AM  Result Value Ref Range   Ferritin 220 11 - 307 ng/mL    Comment: Performed at [facility], [place] [address], [place], [place] [address]  D-dimer, quantitative (not at [facility])     Status: Abnormal   Collection Time: 11/29/18  4:35 AM  Result Value Ref Range   D-Dimer, Quant 19.60 (H) 0.00 - 0.50 ug/mL-FEU    Comment: (NOTE) At the manufacturer cut-off of 0.50 ug/mL FEU, this  assay has been documented to exclude PE with a sensitivity and negative predictive value of 97 to 99%.  At this time, this assay has not been approved by the FDA to exclude DVT/VTE. Results should be correlated with clinical presentation. Performed at [facility], [place] [address], [place], [place] [address]   Pathologist smear review     Status: None   Collection Time: 11/29/18  6:30 AM  Result Value Ref Range   Path Review Reviewed By Violet Baldy, M.D.     Comment: 11/30/2018 NORMOCYTIC ANEMIA WITH ABUNDANCE OF NRBCs, LEUKOCYTOSIS, AND THROMBOCYTOSIS. Performed at [facility], [place] [address] [place], [place] [address]   Glucose, capillary     Status: Abnormal   Collection Time: 11/29/18  6:45 AM  Result Value Ref Range   Glucose-Capillary 165 (H) 70 - 99 mg/dL  Glucose, capillary     Status: Abnormal   Collection Time: 11/29/18 11:12 AM  Result Value Ref Range   Glucose-Capillary 176 (H) 70 - 99 mg/dL  Glucose, capillary     Status: Abnormal   Collection Time: 11/29/18  3:21 PM  Result Value Ref Range  Glucose-Capillary 161 (H) 70 - 99 mg/dL  Glucose, capillary     Status: Abnormal   Collection Time: 11/29/18  7:47 PM  Result Value Ref Range   Glucose-Capillary 194 (H) 70 - 99 mg/dL  Basic metabolic panel     Status: Abnormal   Collection Time: 11/29/18 10:30 PM  Result Value Ref Range   Sodium 146 (H) 135 - 145 mmol/L   Potassium 4.5 3.5 - 5.1 mmol/L   Chloride 100 98 - 111 mmol/L   CO2 32 22 - 32 mmol/L   Glucose, Bld 214 (H) 70 - 99 mg/dL   BUN 91 (H) 8 - 23 mg/dL   Creatinine, Ser 1.30 (H) 0.44 - 1.00 mg/dL   Calcium 7.7 (L) 8.9 - 10.3 mg/dL   GFR calc non Af Amer 42 (L) >60 mL/min   GFR calc Af Amer 49 (L) >60 mL/min   Anion gap 14 5 - 15    Comment: Performed at [facility], [place] [address], [place], [place] [address]  Phosphorus     Status: None   Collection Time: 11/29/18 10:30 PM  Result Value  Ref Range   Phosphorus 3.2 2.5 - 4.6 mg/dL    Comment: Performed at [facility], [place] [address], [place], [place] [address]  Culture, blood (routine x 2)     Status: None (Preliminary result)   Collection Time: 11/29/18 10:55 PM   Specimen: BLOOD  Result Value Ref Range   Specimen Description BLOOD RIGHT ANTECUBITAL    Special Requests      BOTTLES DRAWN AEROBIC AND ANAEROBIC Blood Culture adequate volume   Culture  Setup Time      GRAM POSITIVE COCCI IN CLUSTERS IN BOTH AEROBIC AND ANAEROBIC BOTTLES CRITICAL RESULT CALLED TO, READ BACK BY AND VERIFIED WITH: [facility] CAREN AMEND [id number] FCP Performed at [facility], [place] [address], [place], [place] [address]    Culture GRAM POSITIVE COCCI    Report Status PENDING   Procalcitonin - Baseline     Status: None   Collection Time: 11/29/18 10:55 PM  Result Value Ref Range   Procalcitonin 0.26 ng/mL    Comment:        Interpretation: PCT (Procalcitonin) <= 0.5 ng/mL: Systemic infection (sepsis) is not likely. Local bacterial infection is possible. (NOTE)       Sepsis PCT Algorithm           Lower Respiratory Tract                                      Infection PCT Algorithm    ----------------------------     ----------------------------         PCT < 0.25 ng/mL                PCT < 0.10 ng/mL         Strongly encourage             Strongly discourage   discontinuation of antibiotics    initiation of antibiotics    ----------------------------     -----------------------------       PCT 0.25 - 0.50 ng/mL            PCT 0.10 - 0.25 ng/mL               OR       >80% decrease in PCT  Discourage initiation of                                            antibiotics      Encourage discontinuation           of antibiotics    ----------------------------     -----------------------------         PCT >= 0.50 ng/mL              PCT 0.26 - 0.50 ng/mL               AND        <80% decrease in PCT              Encourage initiation of                                             antibiotics       Encourage continuation           of antibiotics    ----------------------------     -----------------------------        PCT >= 0.50 ng/mL                  PCT > 0.50 ng/mL               AND         increase in PCT                  Strongly encourage                                      initiation of antibiotics    Strongly encourage escalation           of antibiotics                                     -----------------------------                                           PCT <= 0.25 ng/mL                                                 OR                                        > 80% decrease in PCT                                     Discontinue / Do not initiate  antibiotics Performed at [facility], [place] [address] [place], [place] [address]   Blood Culture ID Panel (Reflexed)     Status: Abnormal   Collection Time: 11/29/18 10:55 PM  Result Value Ref Range   Enterococcus species NOT DETECTED NOT DETECTED   Listeria monocytogenes NOT DETECTED NOT DETECTED   Staphylococcus species DETECTED (A) NOT DETECTED    Comment: CRITICAL RESULT CALLED TO, READ BACK BY AND VERIFIED WITH: PHARMD CAREN AMEND [id number] FCP    Staphylococcus aureus (BCID) DETECTED (A) NOT DETECTED    Comment: Methicillin (oxacillin)-resistant Staphylococcus aureus (MRSA). MRSA is predictably resistant to beta-lactam antibiotics (except ceftaroline). Preferred therapy is vancomycin unless clinically contraindicated. Patient requires contact precautions if  hospitalized. CRITICAL RESULT CALLED TO, READ BACK BY AND VERIFIED WITH: PHARMD CAREN AMEND [id number] FCP    Methicillin resistance DETECTED (A) NOT DETECTED    Comment: CRITICAL RESULT CALLED TO, READ BACK BY AND VERIFIED WITH: PHARMD CAREN AMEND [id number] FCP    Streptococcus species NOT  DETECTED NOT DETECTED   Streptococcus agalactiae NOT DETECTED NOT DETECTED   Streptococcus pneumoniae NOT DETECTED NOT DETECTED   Streptococcus pyogenes NOT DETECTED NOT DETECTED   Acinetobacter baumannii NOT DETECTED NOT DETECTED   Enterobacteriaceae species NOT DETECTED NOT DETECTED   Enterobacter cloacae complex NOT DETECTED NOT DETECTED   Escherichia coli NOT DETECTED NOT DETECTED   Klebsiella oxytoca NOT DETECTED NOT DETECTED   Klebsiella pneumoniae NOT DETECTED NOT DETECTED   Proteus species NOT DETECTED NOT DETECTED   Serratia marcescens NOT DETECTED NOT DETECTED   Haemophilus influenzae NOT DETECTED NOT DETECTED   Neisseria meningitidis NOT DETECTED NOT DETECTED   Pseudomonas aeruginosa NOT DETECTED NOT DETECTED   Candida albicans NOT DETECTED NOT DETECTED   Candida glabrata NOT DETECTED NOT DETECTED   Candida krusei NOT DETECTED NOT DETECTED   Candida parapsilosis NOT DETECTED NOT DETECTED   Candida tropicalis NOT DETECTED NOT DETECTED    Comment: Performed at [facility], [address], [place], [place] [address]  Culture, blood (routine x 2)     Status: None (Preliminary result)   Collection Time: 11/29/18 11:05 PM   Specimen: BLOOD RIGHT HAND  Result Value Ref Range   Specimen Description BLOOD RIGHT HAND    Special Requests      BOTTLES DRAWN AEROBIC AND ANAEROBIC Blood Culture adequate volume   Culture  Setup Time      GRAM POSITIVE COCCI IN BOTH AEROBIC AND ANAEROBIC BOTTLES    Culture      NO GROWTH < 12 HOURS Performed at [facility], [place] [address] [place], [place] [address]    Report Status PENDING   Glucose, capillary     Status: Abnormal   Collection Time: 11/29/18 11:57 PM  Result Value Ref Range   Glucose-Capillary 202 (H) 70 - 99 mg/dL  Heparin level (unfractionated)     Status: None   Collection Time: 11/30/18  1:55 AM  Result Value Ref Range   Heparin Unfractionated 0.53 0.30 - 0.70 IU/mL    Comment: (NOTE) If heparin results are  below expected values, and patient dosage has  been confirmed, suggest follow up testing of antithrombin III levels. Performed at [facility], [place] [address], [place], [place] [address]   I-STAT 7, (LYTES, BLD GAS, ICA, H+H)     Status: Abnormal   Collection Time: 11/30/18  2:27 AM  Result Value Ref Range   pH, Arterial 7.480 (H) 7.350 -  7.450   pCO2 arterial 46.4 32.0 - 48.0 mmHg   pO2, Arterial 57.0 (L) 83.0 - 108.0 mmHg   Bicarbonate 34.4 (H) 20.0 - 28.0 mmol/L   TCO2 36 (H) 22 - 32 mmol/L   O2 Saturation 90.0 %   Acid-Base Excess 10.0 (H) 0.0 - 2.0 mmol/L   Sodium 142 135 - 145 mmol/L   Potassium 4.5 3.5 - 5.1 mmol/L   Calcium, Ion 1.04 (L) 1.15 - 1.40 mmol/L   HCT 32.0 (L) 36.0 - 46.0 %   Hemoglobin 10.9 (L) 12.0 - 15.0 g/dL   Patient temperature 99.6 F    Collection site RADIAL, ALLEN'S TEST ACCEPTABLE    Drawn by RT    Sample type ARTERIAL   Urinalysis, Routine w reflex microscopic     Status: Abnormal   Collection Time: 11/30/18  4:00 AM  Result Value Ref Range   Color, Urine YELLOW YELLOW   APPearance HAZY (A) CLEAR   Specific Gravity, Urine <1.005 (L) 1.005 - 1.030   pH >9.0 (H) 5.0 - 8.0   Glucose, UA NEGATIVE NEGATIVE mg/dL   Hgb urine dipstick SMALL (A) NEGATIVE   Bilirubin Urine NEGATIVE NEGATIVE   Ketones, ur NEGATIVE NEGATIVE mg/dL   Protein, ur TRACE (A) NEGATIVE mg/dL   Nitrite NEGATIVE NEGATIVE   Leukocytes,Ua SMALL (A) NEGATIVE    Comment: Performed at [facility], [place] [address], [place], [place] [address]  Urinalysis, Microscopic (reflex)     Status: Abnormal   Collection Time: 11/30/18  4:00 AM  Result Value Ref Range   RBC / HPF 0-5 0 - 5 RBC/hpf   WBC, UA 0-5 0 - 5 WBC/hpf   Bacteria, UA FEW (A) NONE SEEN   Squamous Epithelial / LPF 0-5 0 - 5   Amorphous Crystal PRESENT    Triple Phosphate Crystal PRESENT     Comment: Performed at [facility], [place] [address], [place], [place] [address]   Glucose, capillary     Status: Abnormal   Collection Time: 11/30/18  4:40 AM  Result Value Ref Range   Glucose-Capillary 210 (H) 70 - 99 mg/dL  Brain natriuretic peptide     Status: Abnormal   Collection Time: 11/30/18  4:57 AM  Result Value Ref Range   B Natriuretic Peptide 226.6 (H) 0.0 - 100.0 pg/mL    Comment: Performed at [facility], [place] [address], [place], [place] [address]  CBC with Differential/Platelet     Status: Abnormal   Collection Time: 11/30/18  4:57 AM  Result Value Ref Range   WBC 13.6 (H) 4.0 - 10.5 K/uL   RBC 3.59 (L) 3.87 - 5.11 MIL/uL   Hemoglobin 10.1 (L) 12.0 - 15.0 g/dL   HCT 32.7 (L) 36.0 - 46.0 %   MCV 91.1 80.0 - 100.0 fL   MCH 28.1 26.0 - 34.0 pg   MCHC 30.9 30.0 - 36.0 g/dL   RDW 14.0 11.5 - 15.5 %   Platelets 94 (L) 150 - 400 K/uL    Comment: REPEATED TO VERIFY PLATELET COUNT CONFIRMED BY SMEAR Immature Platelet Fraction may be clinically indicated, consider ordering this additional test [id number]    nRBC 19.5 (H) 0.0 - 0.2 %   Neutrophils Relative % 91 %   Neutro Abs 12.5 (H) 1.7 - 7.7 K/uL   Lymphocytes Relative 4 %   Lymphs Abs 0.5 (L) 0.7 - 4.0 K/uL   Monocytes Relative 3 %   Monocytes Absolute 0.4 0.1 - 1.0  K/uL   Eosinophils Relative 0 %   Eosinophils Absolute 0.0 0.0 - 0.5 K/uL   Basophils Relative 1 %   Basophils Absolute 0.1 0.0 - 0.1 K/uL   WBC Morphology MILD LEFT SHIFT (1-5% METAS, OCC MYELO, OCC BANDS)    Immature Granulocytes 1 %   Abs Immature Granulocytes 0.11 (H) 0.00 - 0.07 K/uL   Dimorphism PRESENT    Polychromasia PRESENT    Ovalocytes PRESENT     Comment: Performed at [facility], [place] [address], [place], [place] [address]  Comprehensive metabolic panel     Status: Abnormal   Collection Time: 11/30/18  4:57 AM  Result Value Ref Range   Sodium 145 135 - 145 mmol/L   Potassium 4.3 3.5 - 5.1 mmol/L   Chloride 98 98 - 111 mmol/L   CO2 32 22 - 32 mmol/L   Glucose, Bld 232 (H)  70 - 99 mg/dL   BUN 98 (H) 8 - 23 mg/dL   Creatinine, Ser 1.43 (H) 0.44 - 1.00 mg/dL   Calcium 7.5 (L) 8.9 - 10.3 mg/dL   Total Protein 4.9 (L) 6.5 - 8.1 g/dL   Albumin 2.4 (L) 3.5 - 5.0 g/dL   AST 63 (H) 15 - 41 U/L   ALT 39 0 - 44 U/L   Alkaline Phosphatase 75 38 - 126 U/L   Total Bilirubin 1.1 0.3 - 1.2 mg/dL   GFR calc non Af Amer 38 (L) >60 mL/min   GFR calc Af Amer 44 (L) >60 mL/min   Anion gap 15 5 - 15    Comment: Performed at [facility], [place] [address], [place], [place] [address]  C-reactive protein     Status: None   Collection Time: 11/30/18  4:57 AM  Result Value Ref Range   CRP <0.8 <1.0 mg/dL    Comment: Performed at [facility], [place] [address] [place], [place] [address]  Magnesium     Status: Abnormal   Collection Time: 11/30/18  4:57 AM  Result Value Ref Range   Magnesium 2.6 (H) 1.7 - 2.4 mg/dL    Comment: Performed at [facility], [place] [address] [place], [place] [address]  Lactate dehydrogenase     Status: Abnormal   Collection Time: 11/30/18  4:57 AM  Result Value Ref Range   LDH 1,097 (H) 98 - 192 U/L    Comment: Performed at [facility], [place] [address], [place], [place] [address]  Ferritin     Status: None   Collection Time: 11/30/18  4:57 AM  Result Value Ref Range   Ferritin 214 11 - 307 ng/mL    Comment: Performed at [facility], [place] [address] [place],  [address]  D-dimer, quantitative (not at [facility])     Status: Abnormal   Collection Time: 11/30/18  4:57 AM  Result Value Ref Range   D-Dimer, Quant 15.28 (H) 0.00 - 0.50 ug/mL-FEU    Comment: (NOTE) At the manufacturer cut-off of 0.50 ug/mL FEU, this assay has been documented to exclude PE with a sensitivity and negative predictive value of 97 to 99%.  At this time, this assay has not been approved by the FDA to exclude DVT/VTE. Results should be correlated with clinical presentation.  Performed at [facility], [place] [address], [place], [place] [address]   Glucose, capillary     Status: Abnormal   Collection Time: 11/30/18  7:53 AM  Result Value Ref  Range   Glucose-Capillary 183 (H) 70 - 99 mg/dL  Glucose, capillary     Status: Abnormal   Collection Time: 11/30/18 11:53 AM  Result Value Ref Range   Glucose-Capillary 201 (H) 70 - 99 mg/dL  Glucose, capillary     Status: Abnormal   Collection Time: 11/30/18  3:16 PM  Result Value Ref Range   Glucose-Capillary 210 (H) 70 - 99 mg/dL    MICRO:  IMAGING: Dg Chest Port 1 View  Result Date: 11/30/2018 CLINICAL DATA:  Respiratory difficulty EXAM: PORTABLE CHEST 1 VIEW COMPARISON:  11/29/2018 FINDINGS: Cardiac shadow is stable. Endotracheal tube, left jugular central line and feeding catheter are again seen and stable. Elevation of the right hemidiaphragm is again seen. Patchy parenchymal opacities are noted bilaterally. These are stable in appearance from the prior exam. No bony abnormality is seen. IMPRESSION: Stable appearance when compared with the previous day. Electronically Signed   By: Inez Catalina M.D.   On: 11/30/2018 03:56   Dg Chest Port 1 View  Result Date: 11/29/2018 CLINICAL DATA:  Acute respiratory failure. EXAM: PORTABLE CHEST 1 VIEW COMPARISON:  Chest radiograph 11/27/2018 FINDINGS: ET tube terminates in the mid trachea. Central venous catheter tip projects over the central left brachiocephalic vein. Enteric tube courses inferior to the diaphragm. Monitoring leads overlie the patient. Stable cardiac and mediastinal contours. Elevation right hemidiaphragm. Interval increase in left mid and lower lung and right lung base opacities. No pleural effusion or pneumothorax. IMPRESSION: Interval increase left mid and lower lung and right lung base opacities. Electronically Signed   By: Lovey Newcomer M.D.   On: 11/29/2018 11:35   [place] Ekg Site Rite  Result Date: 11/30/2018 If Site Rite image not attached,  placement could not be confirmed due to current cardiac rhythm.   Assessment/Plan: 68yo F with multiple co-morbidities with severe covid-19 disease, acute respiratory illness intubated, c/b afib with RVR and most recently with MRSA bacteremia, in setting of central lines.   - will need to repeat blood cx tomorrow to see if still has persistent bacteremia -TTE is pending - pending results will determine to if to treat as complicated bacteremia/presumed endocarditis - will see tomorrow to do physical exam evaluate for any wounds/joints that could be associated with infection  covid 19 disease = severe presentation, likely to have prolonged positive PCR. No need to repeat < 21-28d.

## 2018-11-30 NOTE — Progress Notes (Signed)
PROGRESS NOTE  [name] [name] HAL:[id number] DOB: Jun 18, 1950 DOA: 11/05/2018  PCP: Nicholos Johns, MD  Brief History/Interval Summary: 68 y.o. female, with history of morbid obesity, DM type II, COPD not on oxygen, hypertension, CKD 3 baseline creatinine around 1.4, chronic atrial fibrillation [place] vas 2 score of at least 3 on Xarelto and Cardizem, spine surgeries in the past, chronic fluid retention, history of Graves' disease now hypothyroid, history of CVA, history of DVT who lives at [facility] and was likely exposed to COVID-19 infection about 10 to 14 days prior to this admission.    She was experiencing fever, body aches and developed shortness of breath.  She finally decided to come into the emergency department at [facility].  She was found to be positive for COVID-19.  Was noted to have hypoxia.  She was hospitalized for further management.  Reason for Visit: Acute respiratory failure with hypoxia due to COVID-19  Consultants: Pulmonology  Procedures:  >Intubation 11/19/2018 >Central line insertion 11/19/2018 >Cortrack replacement 11/25/2018 >Transthoracic echocardiogram 7/24 1. The left ventricle has hyperdynamic systolic function, with an ejection fraction of >65%. The cavity size was normal. Left ventricular diastolic Doppler parameters are consistent with impaired relaxation.  2. The right ventricle has normal systolc function. The cavity was normal. There is no increase in right ventricular wall thickness. Right ventricular systolic pressure is normal.  3. Mild thickening of the aortic valve. No stenosis of the aortic valve.  4. The aortic root and ascending aorta are normal in size and structure.   Antibiotics: Anti-infectives (From admission, onward)   Start     Dose/Rate Route Frequency Ordered Stop   12/02/18 1400  vancomycin (VANCOCIN) 1,750 mg in sodium chloride 0.9 % 500 mL IVPB     1,750 mg 250 mL/hr over 120 Minutes Intravenous Every 48 hours 11/30/18  1436     11/30/18 1430  vancomycin (VANCOCIN) 2,500 mg in sodium chloride 0.9 % 500 mL IVPB     2,500 mg 250 mL/hr over 120 Minutes Intravenous  Once 11/30/18 1400 11/30/18 1711   11/19/18 2030  remdesivir 100 mg in sodium chloride 0.9 % 250 mL IVPB     100 mg 500 mL/hr over 30 Minutes Intravenous Every 24 hours 11/14/2018 1855 11/22/18 2204   11/03/2018 2030  remdesivir 200 mg in sodium chloride 0.9 % 250 mL IVPB     200 mg 500 mL/hr over 30 Minutes Intravenous Once 11/26/2018 1855 10/31/2018 2056     Subjective/Interval History: Overnight was febrile as well as tachycardic.  Blood cultures were performed.  Started on IV Cardizem drip for a brief duration.  Transition to IV amiodarone drip.  Assessment/Plan: Acute Hypoxic Resp. Failure due to Acute Covid 19 Viral Illness ARDS  Recent Labs    11/28/18 0430 11/29/18 0435 11/30/18 0457  DDIMER 13.93* 19.60* 15.28*  FERRITIN 548* 220 214  LDH 757* 918* 1,097*  CRP 0.8 <0.8 <0.8    Lab Results  Component Value Date   SARSCOV2NAA POSITIVE (A) 11/07/2018   Fever: Remains afebrile Oxygen requirements: Mechanical ventilation.   Antibacterials: None Remdesivir: Completed course Steroids: Dexamethasone started on 11/20/2018 6 mg once daily.  Being tapered.  Diuretics: Lasix as needed Actemra: Has received 2 doses, 7/22 and 7/23 Vitamin C and Zinc: Continue DVT Prophylaxis: Patient transitioned from rivaroxaban to IV heparin.  Patient remains intubated and sedated.  Pulmonology is following.   Subcutaneous emphysema/pneumomediastinum On 7/26 she was also found to have subcutaneous emphysema. Chest x-ray also which  suggests mediastinal emphysema. Chest x-ray findings have been stable.  Conservative management. No indication for chest tube placement.  MRSA bacteremia. Central line removed. PICC line inserted. Peripheral IV inserted. We will provide line holiday for 24 to 48 hours. Consider PICC line after that. Start on IV  vancomycin per pharmacy. Repeat cultures will be ordered tomorrow. Limited echocardiogram also ordered.  Bleeding from oral cavity Melena Patient is on Xarelto and was on heparin.  Resuming heparin.  No further bleeding noted. Hemoglobin remained stable.  Monitor.  Acute renal failure on chronic kidney disease stage III  Hyperkalemia Hypernatremia-resolved Baseline renal function not entirely known.  There is a creatinine measurement from 2017 of 1.28.  Creatinine was 0.9 in August 2018.   Creatinine has been elevated here in the hospital along with her BUN.   She does take diuretics at home which was placed on hold at admission.  ARB is on hold.   Urine output adequate. Serum creatinine peaked at 2.15. BUN peaked at 150. Now creatinine and BUN both trending down. Patient's potassium was also elevated and she was treated with Lokelma.  Now potassium is better. Serum sodium was 151 at peak.  We will free water with improvement.  Continue free water for now.  History of chronic atrial fibrillation/ episodes of V. tach Chads 2 vasc score is at least 3. Patient was noted to have V. tach after she was intubated and she was placed on amiodarone infusion which was held due to bradycardia.  No recurrence of ventricular arrhythmia.   Had some SVT and A. fib with RVR intermittently.   Echocardiogram normal systolic function.  Diastolic dysfunction was noted.  No significant valvular abnormalities.  Plan is to keep her on IV amiodarone as long as she remains ventilated. Add scheduled low-dose IV Lopressor with holding parameters. IV digoxin daily after initial load. Repeat echocardiogram on 11/30/2018. Highly appreciate cardiology assistance.  History of asthma versus COPD Continue her home medications.  She is not on home oxygen.  Hypothyroidism Continue levothyroxine.   As noted above her TSH was noted to be low.  Free T4 is 1.0.  No further work-up.  Low TSH likely sick euthyroid.   History of stroke Patient is on Xarelto and aspirin.  Switched over to IV heparin in case she needed to undergo chest tube placement. Now back on IV heparin. Not noted to be on statin.  Reason for this is not entirely clear.  This can be addressed when she is over her acute illness.  History of vitamin D deficiency Continue supplementation.  History of DVT She was on Xarelto.   Back on IV heparin.  Monitor closely.  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  Stable.  Diabetes mellitus type 2 with hyperglycemia CBGs are still poorly controlled.  Continue to adjust dose of Levemir.  HbA1c 7.0.  Elevated CBG most likely due to steroids.  Should improve as steroid is tapered down.    Morbid obesity Body mass index is 46.71 kg/m.   Nutrition Continue tube feedings.  Constipation resolved Continue bowel regimen.  Mildly elevated AST. Monitor for now.  Acute blood loss anemia. Hemoglobin on admission was 12.6. Patient had some oral bleed as well as GI bleed. Currently hemoglobin 9.3. Monitor.  Currently on oral iron.  Leukocytosis. In the response to COVID-19 fraction. Currently trending down. Monitor.  DVT Prophylaxis: On IV heparin PUD Prophylaxis: On Protonix Code Status: Full code Family  Communication: Discussed with family.  Goal remains full aggressive care for now.  Although family understand patient's prognosis. Disposition Plan: She will remain in the ICU.   Medications:  Scheduled: . chlorhexidine  15 mL Mouth/Throat BID  . clonazePAM  1 mg Oral BID  . dexamethasone (DECADRON) injection  4 mg Intravenous Q24H  . [START ON 12/01/2018] digoxin  0.125 mg Intravenous Daily  . feeding supplement (PRO-STAT SUGAR FREE 64)  30 mL Per Tube QID  . feeding supplement (VITAL AF 1.2 CAL)  1,000 mL Per Tube Q24H  . fentaNYL (SUBLIMAZE) injection  50 mcg Intravenous Once  . ferrous sulfate  300 mg  Per Tube TID WC  . free water  300 mL Per Tube Q6H  . gabapentin  100 mg Per Tube Q8H  . insulin aspart  0-20 Units Subcutaneous Q4H  . insulin aspart  10 Units Subcutaneous Q4H  . insulin detemir  50 Units Subcutaneous BID  . levothyroxine  112 mcg Oral Q0600  . mouth rinse  15 mL Mouth Rinse 10 times per day  . metoprolol tartrate  2.5 mg Intravenous Q6H  . multivitamin with minerals  1 tablet Per Tube Daily  . oxyCODONE  5 mg Per Tube Q6H  . pantoprazole sodium  40 mg Per Tube BID  . polyethylene glycol  17 g Per Tube Daily  . [START ON 12/01/2018] vitamin C  500 mg Per Tube Daily  . [START ON 12/01/2018] zinc sulfate  220 mg Per Tube Daily   Continuous: . sodium chloride Stopped (11/30/18 1511)  . amiodarone 30 mg/hr (11/30/18 1435)  . diltiazem (CARDIZEM) infusion Stopped (11/30/18 0604)  . fentaNYL infusion INTRAVENOUS 150 mcg/hr (11/30/18 1600)  . heparin 800 Units/hr (11/30/18 1600)  . midazolam 3 mg/hr (11/30/18 1600)  . phenylephrine (NEO-SYNEPHRINE) Adult infusion 10 mcg/min (11/30/18 1600)  . [START ON 12/02/2018] vancomycin     BCW:UGQBVQ chloride, acetaminophen **OR** acetaminophen, albuterol, fentaNYL, metoprolol tartrate, midazolam, sennosides, sodium phosphate, traZODone   Objective:  Vital Signs  Vitals:   11/30/18 1524 11/30/18 1530 11/30/18 1545 11/30/18 1600  BP: 130/69 (!) 142/68 135/65 133/69  Pulse: (!) 108 (!) 109 (!) 108 (!) 108  Resp: (!) 26 (!) 28 (!) 27 (!) 28  Temp:   (!) 101.7 F (38.7 C)   TempSrc:   Axillary   SpO2: 93% 91% 91% 91%  Weight:      Height:        Intake/Output Summary (Last 24 hours) at 11/30/2018 1750 Last data filed at 11/30/2018 1600 Gross per 24 hour  Intake 2894.24 ml  Output 1740 ml  Net 1154.24 ml   Filed Weights   11/28/18 0400 11/29/18 0500 11/30/18 0500  Weight: 116 kg 115.7 kg 119.6 kg   General appearance: Remains intubated and sedated. Resp: Coarse breath sounds bilaterally.  Few crackles at the bases.  No  wheezing or rhonchi. Cardio: S1-S2 irregularly irregular.  Tachycardic. GI: Abdomen is soft.  Nontender nondistended.  Bowel sounds are present normal.  No masses organomegaly Extremities: She has chronic lower extremity edema right greater than left.  Hyperpigmentation over the left leg. Neurologic: Sedated    Lab Results:  Data Reviewed: I have personally reviewed following labs and imaging studies  CBC: Recent Labs  Lab 11/26/18 1525  11/27/18 0450 11/28/18 0430 11/29/18 0435 11/30/18 0227 11/30/18 0457  WBC 28.3*  --  27.5* 23.6* 14.5*  --  13.6*  NEUTROABS  --   --   --  19.9*  11.9*  --  12.5*  HGB 11.4*   < > 10.6* 9.6* 9.3* 10.9* 10.1*  HCT 37.9   < > 35.3* 31.8* 31.3* 32.0* 32.7*  MCV 91.1  --  91.0 90.3 90.2  --  91.1  PLT 149*  --  139* 122* PLATELET CLUMPS NOTED ON SMEAR, UNABLE TO ESTIMATE  --  94*   < > = values in this interval not displayed.    Basic Metabolic Panel: Recent Labs  Lab 11/26/18 1525  11/27/18 0450 11/28/18 0430 11/28/18 1720 11/29/18 0435 11/29/18 2230 11/30/18 0227 11/30/18 0457  NA 149*   < > 151* 148* 145 145 146* 142 145  K 5.8*   < > 5.3* 4.4 4.3 3.9 4.5 4.5 4.3  CL 108  --  109 106 101 102 100  --  98  CO2 30  --  32 34* 31 34* 32  --  32  GLUCOSE 227*  --  180* 288* 327* 204* 214*  --  232*  BUN 150*  --  149* 114* 95* 89* 91*  --  98*  CREATININE 2.10*  --  2.15* 1.44* 1.23* 1.22* 1.30*  --  1.43*  CALCIUM 7.9*  --  7.8* 7.6* 7.2* 7.6* 7.7*  --  7.5*  MG 3.0*  --  3.1* 2.7*  --  2.7*  --   --  2.6*  PHOS  --   --   --   --   --   --  3.2  --   --    < > = values in this interval not displayed.    GFR: Estimated Creatinine Clearance: 47.1 mL/min (A) (by C-G formula based on SCr of 1.43 mg/dL (H)).  Liver Function Tests: Recent Labs  Lab 11/27/18 0450 11/28/18 0430 11/29/18 0435 11/30/18 0457  AST 48* 57* 57* 63*  ALT 36 37 33 39  ALKPHOS 74 77 72 75  BILITOT 0.9 0.7 0.8 1.1  PROT 5.2* 4.9* 5.0* 4.9*  ALBUMIN  2.4* 2.3* 2.3* 2.4*      CBG: Recent Labs  Lab 11/29/18 2357 11/30/18 0440 11/30/18 0753 11/30/18 1153 11/30/18 1516  GLUCAP 202* 210* 183* 201* 210*      Recent Results (from the past 240 hour(s))  Culture, blood (routine x 2)     Status: None (Preliminary result)   Collection Time: 11/29/18 10:55 PM   Specimen: BLOOD  Result Value Ref Range Status   Specimen Description BLOOD RIGHT ANTECUBITAL  Final   Special Requests   Final    BOTTLES DRAWN AEROBIC AND ANAEROBIC Blood Culture adequate volume   Culture  Setup Time   Final    GRAM POSITIVE COCCI IN CLUSTERS IN BOTH AEROBIC AND ANAEROBIC BOTTLES CRITICAL RESULT CALLED TO, READ BACK BY AND VERIFIED WITH: [facility] CAREN AMEND [id number] FCP Performed at [facility], [place] [address], [place], [place] [address]    Culture GRAM POSITIVE COCCI  Final   Report Status PENDING  Incomplete  Blood Culture ID Panel (Reflexed)     Status: Abnormal   Collection Time: 11/29/18 10:55 PM  Result Value Ref Range Status   Enterococcus species NOT DETECTED NOT DETECTED Final   Listeria monocytogenes NOT DETECTED NOT DETECTED Final   Staphylococcus species DETECTED (A) NOT DETECTED Final    Comment: CRITICAL RESULT CALLED TO, READ BACK BY AND VERIFIED WITH: PHARMD CAREN AMEND [id number] FCP    Staphylococcus aureus (BCID) DETECTED (A) NOT DETECTED Final  Comment: Methicillin (oxacillin)-resistant Staphylococcus aureus (MRSA). MRSA is predictably resistant to beta-lactam antibiotics (except ceftaroline). Preferred therapy is vancomycin unless clinically contraindicated. Patient requires contact precautions if  hospitalized. CRITICAL RESULT CALLED TO, READ BACK BY AND VERIFIED WITH: PHARMD CAREN AMEND [id number] FCP    Methicillin resistance DETECTED (A) NOT DETECTED Final    Comment: CRITICAL RESULT CALLED TO, READ BACK BY AND VERIFIED WITH: PHARMD CAREN AMEND [id number] FCP    Streptococcus species NOT DETECTED NOT  DETECTED Final   Streptococcus agalactiae NOT DETECTED NOT DETECTED Final   Streptococcus pneumoniae NOT DETECTED NOT DETECTED Final   Streptococcus pyogenes NOT DETECTED NOT DETECTED Final   Acinetobacter baumannii NOT DETECTED NOT DETECTED Final   Enterobacteriaceae species NOT DETECTED NOT DETECTED Final   Enterobacter cloacae complex NOT DETECTED NOT DETECTED Final   Escherichia coli NOT DETECTED NOT DETECTED Final   Klebsiella oxytoca NOT DETECTED NOT DETECTED Final   Klebsiella pneumoniae NOT DETECTED NOT DETECTED Final   Proteus species NOT DETECTED NOT DETECTED Final   Serratia marcescens NOT DETECTED NOT DETECTED Final   Haemophilus influenzae NOT DETECTED NOT DETECTED Final   Neisseria meningitidis NOT DETECTED NOT DETECTED Final   Pseudomonas aeruginosa NOT DETECTED NOT DETECTED Final   Candida albicans NOT DETECTED NOT DETECTED Final   Candida glabrata NOT DETECTED NOT DETECTED Final   Candida krusei NOT DETECTED NOT DETECTED Final   Candida parapsilosis NOT DETECTED NOT DETECTED Final   Candida tropicalis NOT DETECTED NOT DETECTED Final    Comment: Performed at [facility], [place]. [address], [place], [place] [address]  Culture, blood (routine x 2)     Status: None (Preliminary result)   Collection Time: 11/29/18 11:05 PM   Specimen: BLOOD RIGHT HAND  Result Value Ref Range Status   Specimen Description BLOOD RIGHT HAND  Final   Special Requests   Final    BOTTLES DRAWN AEROBIC AND ANAEROBIC Blood Culture adequate volume   Culture  Setup Time   Final    GRAM POSITIVE COCCI IN BOTH AEROBIC AND ANAEROBIC BOTTLES    Culture   Final    NO GROWTH < 12 HOURS Performed at [facility], [place] [address] [place], [place] [address]    Report Status PENDING  Incomplete      Radiology Studies: Dg Chest Port 1 View  Result Date: 11/30/2018 CLINICAL DATA:  Respiratory difficulty EXAM: PORTABLE CHEST 1 VIEW COMPARISON:  11/29/2018 FINDINGS: Cardiac shadow is stable.  Endotracheal tube, left jugular central line and feeding catheter are again seen and stable. Elevation of the right hemidiaphragm is again seen. Patchy parenchymal opacities are noted bilaterally. These are stable in appearance from the prior exam. No bony abnormality is seen. IMPRESSION: Stable appearance when compared with the previous day. Electronically Signed   By: Inez Catalina M.D.   On: 11/30/2018 03:56   Dg Chest Port 1 View  Result Date: 11/29/2018 CLINICAL DATA:  Acute respiratory failure. EXAM: PORTABLE CHEST 1 VIEW COMPARISON:  Chest radiograph 11/27/2018 FINDINGS: ET tube terminates in the mid trachea. Central venous catheter tip projects over the central left brachiocephalic vein. Enteric tube courses inferior to the diaphragm. Monitoring leads overlie the patient. Stable cardiac and mediastinal contours. Elevation right hemidiaphragm. Interval increase in left mid and lower lung and right lung base opacities. No pleural effusion or pneumothorax. IMPRESSION: Interval increase left mid and lower lung and right lung base opacities. Electronically Signed   By: Polly Cobia.D.  On: 11/29/2018 11:35   [place] Ekg Site Rite  Result Date: 11/30/2018 If Site Rite image not attached, placement could not be confirmed due to current cardiac rhythm.      LOS: 12 days   Author:  Berle Mull  [facility]  To reach On-call, see care teams to locate the attending and reach out to them via [email or web address]. If 7PM-7AM, please contact night-coverage If you still have difficulty reaching the attending provider, please page the [facility] (Director on Call) for [facility] on amion for assistance.   11/30/2018, 5:50 PM

## 2018-11-30 NOTE — Progress Notes (Signed)
PT Cancellation Note  Patient Details Name: [name] [name] MRN: [id number] DOB: [birth year]   Cancelled Treatment:    Reason Eval/Treat Not Completed: Medical issues which prohibited therapy, remains on vent and High HR   Claretha Cooper 11/30/2018, 7:06 AM

## 2018-11-30 NOTE — Progress Notes (Signed)
eLink Physician-Brief Progress Note Patient Name: [name] [name] DOB: 25-Apr-1951 MRN: [id number]   Date of Service  11/30/2018  HPI/Events of Note  HR remains elevated despite cardizem.  eICU Interventions   Will give a one time dose of digoxin  May need to resume amiodarone drip  Labs pending     Intervention Category Major Interventions: Arrhythmia - evaluation and management  Shona Needles Adrick Kestler 11/30/2018, 6:07 AM

## 2018-11-30 NOTE — Progress Notes (Signed)
PHARMACY - PHYSICIAN COMMUNICATION CRITICAL VALUE ALERT - BLOOD CULTURE IDENTIFICATION (BCID)  [name] [name] is an 68 y.o. female who presented to [facility] on 11/13/2018 with a chief complaint of acute respiratory failure.   Assessment:  4/4 blood cultures now growing GPCs in clusters. BCID detected MRSA   Name of physician (or Provider) Contacted: Dr. Lake Bells   Current antibiotics: None   Changes to prescribed antibiotics recommended: Add IV Vancomycin Recommendations accepted by provider  Results for orders placed or performed during the hospital encounter of 10/29/2018  Blood Culture ID Panel (Reflexed) (Collected: 11/29/2018 10:55 PM)  Result Value Ref Range   Enterococcus species NOT DETECTED NOT DETECTED   Listeria monocytogenes NOT DETECTED NOT DETECTED   Staphylococcus species DETECTED (A) NOT DETECTED   Staphylococcus aureus (BCID) DETECTED (A) NOT DETECTED   Methicillin resistance DETECTED (A) NOT DETECTED   Streptococcus species NOT DETECTED NOT DETECTED   Streptococcus agalactiae NOT DETECTED NOT DETECTED   Streptococcus pneumoniae NOT DETECTED NOT DETECTED   Streptococcus pyogenes NOT DETECTED NOT DETECTED   Acinetobacter baumannii NOT DETECTED NOT DETECTED   Enterobacteriaceae species NOT DETECTED NOT DETECTED   Enterobacter cloacae complex NOT DETECTED NOT DETECTED   Escherichia coli NOT DETECTED NOT DETECTED   Klebsiella oxytoca NOT DETECTED NOT DETECTED   Klebsiella pneumoniae NOT DETECTED NOT DETECTED   Proteus species NOT DETECTED NOT DETECTED   Serratia marcescens NOT DETECTED NOT DETECTED   Haemophilus influenzae NOT DETECTED NOT DETECTED   Neisseria meningitidis NOT DETECTED NOT DETECTED   Pseudomonas aeruginosa NOT DETECTED NOT DETECTED   Candida albicans NOT DETECTED NOT DETECTED   Candida glabrata NOT DETECTED NOT DETECTED   Candida krusei NOT DETECTED NOT DETECTED   Candida parapsilosis NOT DETECTED NOT DETECTED   Candida tropicalis NOT DETECTED NOT  DETECTED    Albertina Parr, PharmD., BCPS Clinical Pharmacist

## 2018-11-30 NOTE — Progress Notes (Signed)
eLink Physician-Brief Progress Note Patient Name: [name] [name] DOB: Feb 17, 1951 MRN: [id number]   Date of Service  11/30/2018  HPI/Events of Note  Patient now afebrile but persistently in afib RVR 150s  eICU Interventions  Will start diltiazem drip     Intervention Category Major Interventions: Arrhythmia - evaluation and management  Judd Lien 11/30/2018, 2:54 AM

## 2018-12-01 ENCOUNTER — Inpatient Hospital Stay (HOSPITAL_COMMUNITY): Payer: Medicare Other

## 2018-12-01 DIAGNOSIS — R7881 Bacteremia: Secondary | ICD-10-CM

## 2018-12-01 LAB — CBC WITH DIFFERENTIAL/PLATELET
Abs Immature Granulocytes: 0.11 10*3/uL — ABNORMAL HIGH (ref 0.00–0.07)
Basophils Absolute: 0.1 10*3/uL (ref 0.0–0.1)
Basophils Relative: 1 %
Eosinophils Absolute: 0 10*3/uL (ref 0.0–0.5)
Eosinophils Relative: 0 %
HCT: 31.9 % — ABNORMAL LOW (ref 36.0–46.0)
Hemoglobin: 9.6 g/dL — ABNORMAL LOW (ref 12.0–15.0)
Immature Granulocytes: 1 %
Lymphocytes Relative: 8 %
Lymphs Abs: 0.9 10*3/uL (ref 0.7–4.0)
MCH: 28 pg (ref 26.0–34.0)
MCHC: 30.1 g/dL (ref 30.0–36.0)
MCV: 93 fL (ref 80.0–100.0)
Monocytes Absolute: 0.2 10*3/uL (ref 0.1–1.0)
Monocytes Relative: 2 %
Neutro Abs: 9.9 10*3/uL — ABNORMAL HIGH (ref 1.7–7.7)
Neutrophils Relative %: 88 %
Platelets: 81 10*3/uL — ABNORMAL LOW (ref 150–400)
RBC: 3.43 MIL/uL — ABNORMAL LOW (ref 3.87–5.11)
RDW: 17.6 % — ABNORMAL HIGH (ref 11.5–15.5)
WBC: 11.3 10*3/uL — ABNORMAL HIGH (ref 4.0–10.5)
nRBC: 27.7 % — ABNORMAL HIGH (ref 0.0–0.2)

## 2018-12-01 LAB — COMPREHENSIVE METABOLIC PANEL
ALT: 70 U/L — ABNORMAL HIGH (ref 0–44)
AST: 88 U/L — ABNORMAL HIGH (ref 15–41)
Albumin: 2.3 g/dL — ABNORMAL LOW (ref 3.5–5.0)
Alkaline Phosphatase: 82 U/L (ref 38–126)
Anion gap: 7 (ref 5–15)
BUN: 99 mg/dL — ABNORMAL HIGH (ref 8–23)
CO2: 33 mmol/L — ABNORMAL HIGH (ref 22–32)
Calcium: 7.2 mg/dL — ABNORMAL LOW (ref 8.9–10.3)
Chloride: 102 mmol/L (ref 98–111)
Creatinine, Ser: 1.34 mg/dL — ABNORMAL HIGH (ref 0.44–1.00)
GFR calc Af Amer: 47 mL/min — ABNORMAL LOW (ref >60)
GFR calc non Af Amer: 41 mL/min — ABNORMAL LOW (ref >60)
Glucose, Bld: 191 mg/dL — ABNORMAL HIGH (ref 70–99)
Potassium: 4.2 mmol/L (ref 3.5–5.1)
Sodium: 142 mmol/L (ref 135–145)
Total Bilirubin: 1.1 mg/dL (ref 0.3–1.2)
Total Protein: 4.7 g/dL — ABNORMAL LOW (ref 6.5–8.1)

## 2018-12-01 LAB — ECHOCARDIOGRAM LIMITED
Height: 63 in
Weight: 4172.87 oz

## 2018-12-01 LAB — GLUCOSE, CAPILLARY
Glucose-Capillary: 178 mg/dL — ABNORMAL HIGH (ref 70–99)
Glucose-Capillary: 179 mg/dL — ABNORMAL HIGH (ref 70–99)
Glucose-Capillary: 181 mg/dL — ABNORMAL HIGH (ref 70–99)
Glucose-Capillary: 182 mg/dL — ABNORMAL HIGH (ref 70–99)
Glucose-Capillary: 198 mg/dL — ABNORMAL HIGH (ref 70–99)
Glucose-Capillary: 201 mg/dL — ABNORMAL HIGH (ref 70–99)
Glucose-Capillary: 205 mg/dL — ABNORMAL HIGH (ref 70–99)

## 2018-12-01 LAB — D-DIMER, QUANTITATIVE: D-Dimer, Quant: 8.65 ug/mL-FEU — ABNORMAL HIGH (ref 0.00–0.50)

## 2018-12-01 LAB — HEPARIN LEVEL (UNFRACTIONATED)
Heparin Unfractionated: 0.77 IU/mL — ABNORMAL HIGH (ref 0.30–0.70)
Heparin Unfractionated: 0.52 IU/mL (ref 0.30–0.70)

## 2018-12-01 LAB — MAGNESIUM: Magnesium: 2.5 mg/dL — ABNORMAL HIGH (ref 1.7–2.4)

## 2018-12-01 LAB — FERRITIN: Ferritin: 347 ng/mL — ABNORMAL HIGH (ref 11–307)

## 2018-12-01 LAB — LACTATE DEHYDROGENASE: LDH: 1216 U/L — ABNORMAL HIGH (ref 98–192)

## 2018-12-01 LAB — C-REACTIVE PROTEIN: CRP: 1.5 mg/dL — ABNORMAL HIGH (ref <1.0)

## 2018-12-01 LAB — BRAIN NATRIURETIC PEPTIDE: B Natriuretic Peptide: 276.6 pg/mL — ABNORMAL HIGH (ref 0.0–100.0)

## 2018-12-01 MED ORDER — FUROSEMIDE 10 MG/ML IJ SOLN
40.0000 mg | Freq: Once | INTRAMUSCULAR | Status: AC
  Administered 2018-12-01: 40 mg via INTRAVENOUS
  Filled 2018-12-01: qty 4

## 2018-12-01 MED ORDER — ALBUMIN HUMAN 25 % IV SOLN
25.0000 g | Freq: Once | INTRAVENOUS | Status: AC
  Administered 2018-12-01: 25 g via INTRAVENOUS
  Filled 2018-12-01: qty 100

## 2018-12-01 NOTE — Progress Notes (Signed)
  Echocardiogram 2D Echocardiogram has been performed.  [name] [name] G Jerlyn Pain 12/01/2018, 11:58 AM

## 2018-12-01 NOTE — Progress Notes (Signed)
PROGRESS NOTE  [name] [name] QTM:[id number] DOB: 13-Mar-1951 DOA: 10/29/2018  PCP: Nicholos Johns, MD  Brief History/Interval Summary: 68 y.o. female, with history of morbid obesity, DM type II, COPD not on oxygen, hypertension, CKD 3 baseline creatinine around 1.4, chronic atrial fibrillation [place] vas 2 score of at least 3 on Xarelto and Cardizem, spine surgeries in the past, chronic fluid retention, history of Graves' disease now hypothyroid, history of CVA, history of DVT who lives at [facility] and was likely exposed to COVID-19 infection about 10 to 14 days prior to this admission.    She was experiencing fever, body aches and developed shortness of breath.  She finally decided to come into the emergency department at [facility].  She was found to be positive for COVID-19.  Was noted to have hypoxia.  She was hospitalized for further management.  Reason for Visit: Acute respiratory failure with hypoxia due to COVID-19  Consultants: Pulmonology  Procedures:  >Intubation 11/19/2018 >Central line insertion 11/19/2018 >Cortrack replacement 11/25/2018 >Transthoracic echocardiogram 7/24 1. The left ventricle has hyperdynamic systolic function, with an ejection fraction of >65%. The cavity size was normal. Left ventricular diastolic Doppler parameters are consistent with impaired relaxation.  2. The right ventricle has normal systolc function. The cavity was normal. There is no increase in right ventricular wall thickness. Right ventricular systolic pressure is normal.  3. Mild thickening of the aortic valve. No stenosis of the aortic valve.  4. The aortic root and ascending aorta are normal in size and structure. > Limited echocardiogram 12/01/2018 no vegetation.   Antibiotics: Anti-infectives (From admission, onward)   Start     Dose/Rate Route Frequency Ordered Stop   12/02/18 1400  vancomycin (VANCOCIN) 1,750 mg in sodium chloride 0.9 % 500 mL IVPB     1,750 mg 250 mL/hr over  120 Minutes Intravenous Every 48 hours 11/30/18 1436     11/30/18 1430  vancomycin (VANCOCIN) 2,500 mg in sodium chloride 0.9 % 500 mL IVPB     2,500 mg 250 mL/hr over 120 Minutes Intravenous  Once 11/30/18 1400 11/30/18 1711   11/19/18 2030  remdesivir 100 mg in sodium chloride 0.9 % 250 mL IVPB     100 mg 500 mL/hr over 30 Minutes Intravenous Every 24 hours 10/28/2018 1855 11/22/18 2204   11/14/2018 2030  remdesivir 200 mg in sodium chloride 0.9 % 250 mL IVPB     200 mg 500 mL/hr over 30 Minutes Intravenous Once 11/02/2018 1855 11/22/2018 2056     Subjective/Interval History: Still has low-grade fever but no other acute events.  No diarrhea no blood in the stool.  Assessment/Plan: Acute Hypoxic Resp. Failure due to Acute Covid 19 Viral Illness ARDS  Recent Labs    11/29/18 0435 11/30/18 0457 12/01/18 0535  DDIMER 19.60* 15.28* 8.65*  FERRITIN 220 214 347*  LDH 918* 1,097* 1,216*  CRP <0.8 <0.8 1.5*    Lab Results  Component Value Date   SARSCOV2NAA POSITIVE (A) 11/05/2018   Fever: Remains afebrile Oxygen requirements: Mechanical ventilation.   Antibacterials: None Remdesivir: Completed course Steroids: Dexamethasone started on 11/27/2018 6 mg once daily.  Tapered and stopped at 14 days.  Diuretics: Lasix as needed Actemra: Has received 2 doses, 7/22 and 7/23 Vitamin C and Zinc: Continue DVT Prophylaxis: Patient transitioned from rivaroxaban to IV heparin.  Patient remains intubated and sedated.  Pulmonology is following.   Subcutaneous emphysema/pneumomediastinum On 7/26 she was also found to have subcutaneous emphysema. Chest x-ray also which suggests  mediastinal emphysema. Chest x-ray findings have been stable.  Conservative management. No indication for chest tube placement.  MRSA bacteremia. Central line removed. PICC line inserted. Peripheral IV inserted. We will provide line holiday for 24 to 48 hours. Consider PICC line after that. Start on IV vancomycin per  pharmacy. Repeat cultures done Limited echocardiogram negative for any vegetation.  Will require TEE.  Anticipating that this patient will require prolonged antibiotics.  Bleeding from oral cavity Melena Patient is on Xarelto and was on heparin.  Resuming heparin.  No further bleeding noted. Hemoglobin remained stable.  Monitor.  Acute thrombocytopenia. Likely in the setting of MRSA bacteremia and sepsis. Timing is not adequate for heparin-induced thrombocytopenia. We will need to monitor it further.  Poor circulation. Bilateral lower extremity cool to touch although pulses are present but faint. Check ABI.  Anasarca. Appears to be third spacing with fluid. With albumin and Lasix.  Acute renal failure on chronic kidney disease stage III  Hyperkalemia Hypernatremia-resolved Baseline renal function not entirely known.  There is a creatinine measurement from 2017 of 1.28.  Creatinine was 0.9 in August 2018.   Creatinine has been elevated here in the hospital along with her BUN.   She does take diuretics at home which was placed on hold at admission.  ARB is on hold.   Urine output adequate. Serum creatinine peaked at 2.15. BUN peaked at 150. Now creatinine and BUN both trending down. Patient's potassium was also elevated and she was treated with Lokelma.  Now potassium is better. Serum sodium was 151 at peak.  We will free water with improvement.  Continue free water for now.  History of chronic atrial fibrillation/ episodes of V. tach Chads 2 vasc score is at least 3. Patient was noted to have V. tach after she was intubated and she was placed on amiodarone infusion which was held due to bradycardia.  No recurrence of ventricular arrhythmia.   Had some SVT and A. fib with RVR intermittently.   Echocardiogram normal systolic function.  Diastolic dysfunction was noted.  No significant valvular abnormalities.  Plan is to keep her on IV amiodarone as long as she remains  ventilated. Add scheduled low-dose IV Lopressor with holding parameters. IV digoxin daily after initial load. Repeat echocardiogram on 11/30/2018. Highly appreciate cardiology assistance.  History of asthma versus COPD Continue her home medications.  She is not on home oxygen.  Hypothyroidism Continue levothyroxine.   As noted above her TSH was noted to be low.  Free T4 is 1.0.  No further work-up.  Low TSH likely sick euthyroid.  History of stroke Patient is on Xarelto and aspirin.  Switched over to IV heparin in case she needed to undergo chest tube placement. Now back on IV heparin. Not noted to be on statin.  Reason for this is not entirely clear.  This can be addressed when she is over her acute illness.  History of vitamin D deficiency Continue supplementation.  History of DVT She was on Xarelto.   Back on IV heparin.  Monitor closely.  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  Stable.  Diabetes mellitus type 2 with hyperglycemia CBGs are still poorly controlled.  Continue to adjust dose of Levemir.  HbA1c 7.0.  Elevated CBG most likely due to steroids.  Should improve as steroid is tapered down.    Morbid obesity Body mass index is 46.2 kg/m.   Nutrition Continue tube feedings.  Constipation  resolved Continue bowel regimen.  Mildly elevated AST. Monitor for now.  Acute blood loss anemia. Hemoglobin on admission was 12.6. Patient had some oral bleed as well as GI bleed. Currently hemoglobin 9.3. Monitor.  Currently on oral iron.  Leukocytosis. In the response to COVID-19 fraction. Currently trending down. Monitor.  DVT Prophylaxis: On IV heparin PUD Prophylaxis: On Protonix Code Status: Full code Family Communication: Discussed with family.  Goal remains full aggressive care for now.  Although family understand patient's prognosis. Disposition Plan: She will remain in the ICU.    Medications:  Scheduled: . chlorhexidine  15 mL Mouth/Throat BID  . clonazePAM  1 mg Oral BID  . digoxin  0.125 mg Intravenous Daily  . feeding supplement (PRO-STAT SUGAR FREE 64)  30 mL Per Tube QID  . feeding supplement (VITAL AF 1.2 CAL)  1,000 mL Per Tube Q24H  . fentaNYL (SUBLIMAZE) injection  50 mcg Intravenous Once  . ferrous sulfate  300 mg Per Tube TID WC  . free water  300 mL Per Tube Q6H  . gabapentin  100 mg Per Tube Q8H  . insulin aspart  0-20 Units Subcutaneous Q4H  . insulin aspart  10 Units Subcutaneous Q4H  . insulin detemir  50 Units Subcutaneous BID  . levothyroxine  112 mcg Oral Q0600  . mouth rinse  15 mL Mouth Rinse 10 times per day  . metoprolol tartrate  2.5 mg Intravenous Q6H  . multivitamin with minerals  1 tablet Per Tube Daily  . oxyCODONE  5 mg Per Tube Q6H  . pantoprazole sodium  40 mg Per Tube BID  . polyethylene glycol  17 g Per Tube Daily  . sodium chloride flush  10-40 mL Intracatheter Q12H  . vitamin C  500 mg Per Tube Daily  . zinc sulfate  220 mg Per Tube Daily   Continuous: . sodium chloride Stopped (12/01/18 1300)  . amiodarone 30 mg/hr (12/01/18 1800)  . diltiazem (CARDIZEM) infusion Stopped (11/30/18 0604)  . fentaNYL infusion INTRAVENOUS 100 mcg/hr (12/01/18 1800)  . heparin 750 Units/hr (12/01/18 1800)  . midazolam 2 mg/hr (12/01/18 1800)  . phenylephrine (NEO-SYNEPHRINE) Adult infusion Stopped (11/30/18 2135)  . [START ON 12/02/2018] vancomycin     CHE:NIDPOE chloride, acetaminophen **OR** acetaminophen, albuterol, fentaNYL, metoprolol tartrate, midazolam, sennosides, sodium chloride flush, sodium phosphate, traZODone   Objective:  Vital Signs  Vitals:   12/01/18 1531 12/01/18 1600 12/01/18 1700 12/01/18 1800  BP:  108/73 (!) 114/59 (!) 106/58  Pulse:  89 87 86  Resp:  (!) 26 (!) 22 19  Temp: 98.6 F (37 C)     TempSrc: Axillary     SpO2:  91% 93% 93%  Weight:      Height:        Intake/Output Summary (Last 24 hours) at  12/01/2018 1949 Last data filed at 12/01/2018 1800 Gross per 24 hour  Intake 2533.58 ml  Output 3050 ml  Net -516.42 ml   Filed Weights   11/29/18 0500 11/30/18 0500 12/01/18 0500  Weight: 115.7 kg 119.6 kg 118.3 kg   General appearance: Remains intubated and sedated. Resp: Coarse breath sounds bilaterally.  Few crackles at the bases.  No wheezing or rhonchi. Cardio: S1-S2 irregularly irregular.  Tachycardic. GI: Abdomen is soft.  Nontender nondistended.  Bowel sounds are present normal.  No masses organomegaly Extremities: She has chronic lower extremity edema right greater than left.  Hyperpigmentation over the left leg. Neurologic: Sedated    Lab Results:  Data  Reviewed: I have personally reviewed following labs and imaging studies  CBC: Recent Labs  Lab 11/27/18 0450 11/28/18 0430 11/29/18 0435 11/30/18 0227 11/30/18 0457 12/01/18 0535  WBC 27.5* 23.6* 14.5*  --  13.6* 11.3*  NEUTROABS  --  19.9* 11.9*  --  12.5* 9.9*  HGB 10.6* 9.6* 9.3* 10.9* 10.1* 9.6*  HCT 35.3* 31.8* 31.3* 32.0* 32.7* 31.9*  MCV 91.0 90.3 90.2  --  91.1 93.0  PLT 139* 122* PLATELET CLUMPS NOTED ON SMEAR, UNABLE TO ESTIMATE  --  94* 81*    Basic Metabolic Panel: Recent Labs  Lab 11/27/18 0450 11/28/18 0430 11/28/18 1720 11/29/18 0435 11/29/18 2230 11/30/18 0227 11/30/18 0457 12/01/18 0535  NA 151* 148* 145 145 146* 142 145 142  K 5.3* 4.4 4.3 3.9 4.5 4.5 4.3 4.2  CL 109 106 101 102 100  --  98 102  CO2 32 34* 31 34* 32  --  32 33*  GLUCOSE 180* 288* 327* 204* 214*  --  232* 191*  BUN 149* 114* 95* 89* 91*  --  98* 99*  CREATININE 2.15* 1.44* 1.23* 1.22* 1.30*  --  1.43* 1.34*  CALCIUM 7.8* 7.6* 7.2* 7.6* 7.7*  --  7.5* 7.2*  MG 3.1* 2.7*  --  2.7*  --   --  2.6* 2.5*  PHOS  --   --   --   --  3.2  --   --   --     GFR: Estimated Creatinine Clearance: 50 mL/min (A) (by C-G formula based on SCr of 1.34 mg/dL (H)).  Liver Function Tests: Recent Labs  Lab 11/27/18 0450 11/28/18  0430 11/29/18 0435 11/30/18 0457 12/01/18 0535  AST 48* 57* 57* 63* 88*  ALT 36 37 33 39 70*  ALKPHOS 74 77 72 75 82  BILITOT 0.9 0.7 0.8 1.1 1.1  PROT 5.2* 4.9* 5.0* 4.9* 4.7*  ALBUMIN 2.4* 2.3* 2.3* 2.4* 2.3*      CBG: Recent Labs  Lab 12/01/18 0023 12/01/18 0324 12/01/18 0829 12/01/18 1217 12/01/18 1544  GLUCAP 205* 181* 178* 182* 179*      Recent Results (from the past 240 hour(s))  Culture, blood (routine x 2)     Status: Abnormal (Preliminary result)   Collection Time: 11/29/18 10:55 PM   Specimen: BLOOD  Result Value Ref Range Status   Specimen Description BLOOD RIGHT ANTECUBITAL  Final   Special Requests   Final    BOTTLES DRAWN AEROBIC AND ANAEROBIC Blood Culture adequate volume   Culture  Setup Time   Final    GRAM POSITIVE COCCI IN CLUSTERS IN BOTH AEROBIC AND ANAEROBIC BOTTLES CRITICAL RESULT CALLED TO, READ BACK BY AND VERIFIED WITH: PHARMD CAREN AMEND [id number] FCP    Culture (A)  Final    STAPHYLOCOCCUS AUREUS SUSCEPTIBILITIES TO FOLLOW Performed at [facility], [place] [address] [place], [place] [address]    Report Status PENDING  Incomplete  Blood Culture ID Panel (Reflexed)     Status: Abnormal   Collection Time: 11/29/18 10:55 PM  Result Value Ref Range Status   Enterococcus species NOT DETECTED NOT DETECTED Final   Listeria monocytogenes NOT DETECTED NOT DETECTED Final   Staphylococcus species DETECTED (A) NOT DETECTED Final    Comment: CRITICAL RESULT CALLED TO, READ BACK BY AND VERIFIED WITH: PHARMD CAREN AMEND [id number] FCP    Staphylococcus aureus (BCID) DETECTED (A) NOT DETECTED Final    Comment: Methicillin (oxacillin)-resistant Staphylococcus aureus (MRSA). MRSA is predictably  resistant to beta-lactam antibiotics (except ceftaroline). Preferred therapy is vancomycin unless clinically contraindicated. Patient requires contact precautions if  hospitalized. CRITICAL RESULT CALLED TO, READ BACK BY AND VERIFIED WITH: PHARMD  CAREN AMEND [id number] FCP    Methicillin resistance DETECTED (A) NOT DETECTED Final    Comment: CRITICAL RESULT CALLED TO, READ BACK BY AND VERIFIED WITH: PHARMD CAREN AMEND [id number] FCP    Streptococcus species NOT DETECTED NOT DETECTED Final   Streptococcus agalactiae NOT DETECTED NOT DETECTED Final   Streptococcus pneumoniae NOT DETECTED NOT DETECTED Final   Streptococcus pyogenes NOT DETECTED NOT DETECTED Final   Acinetobacter baumannii NOT DETECTED NOT DETECTED Final   Enterobacteriaceae species NOT DETECTED NOT DETECTED Final   Enterobacter cloacae complex NOT DETECTED NOT DETECTED Final   Escherichia coli NOT DETECTED NOT DETECTED Final   Klebsiella oxytoca NOT DETECTED NOT DETECTED Final   Klebsiella pneumoniae NOT DETECTED NOT DETECTED Final   Proteus species NOT DETECTED NOT DETECTED Final   Serratia marcescens NOT DETECTED NOT DETECTED Final   Haemophilus influenzae NOT DETECTED NOT DETECTED Final   Neisseria meningitidis NOT DETECTED NOT DETECTED Final   Pseudomonas aeruginosa NOT DETECTED NOT DETECTED Final   Candida albicans NOT DETECTED NOT DETECTED Final   Candida glabrata NOT DETECTED NOT DETECTED Final   Candida krusei NOT DETECTED NOT DETECTED Final   Candida parapsilosis NOT DETECTED NOT DETECTED Final   Candida tropicalis NOT DETECTED NOT DETECTED Final    Comment: Performed at [facility], [place]. [address] [place], [place] [address]  Culture, blood (routine x 2)     Status: None (Preliminary result)   Collection Time: 11/29/18 11:05 PM   Specimen: BLOOD RIGHT HAND  Result Value Ref Range Status   Specimen Description BLOOD RIGHT HAND  Final   Special Requests   Final    BOTTLES DRAWN AEROBIC AND ANAEROBIC Blood Culture adequate volume   Culture  Setup Time   Final    GRAM POSITIVE COCCI IN BOTH AEROBIC AND ANAEROBIC BOTTLES CRITICAL VALUE NOTED.  VALUE IS CONSISTENT WITH PREVIOUSLY REPORTED AND CALLED VALUE. Performed at [facility],  [place] [address], [place], [place] [address]    Culture [facility] POSITIVE COCCI  Final   Report Status PENDING  Incomplete      Radiology Studies: Dg Chest Port 1 View  Result Date: 11/30/2018 CLINICAL DATA:  Respiratory difficulty EXAM: PORTABLE CHEST 1 VIEW COMPARISON:  11/29/2018 FINDINGS: Cardiac shadow is stable. Endotracheal tube, left jugular central line and feeding catheter are again seen and stable. Elevation of the right hemidiaphragm is again seen. Patchy parenchymal opacities are noted bilaterally. These are stable in appearance from the prior exam. No bony abnormality is seen. IMPRESSION: Stable appearance when compared with the previous day. Electronically Signed   By: Inez Catalina M.D.   On: 11/30/2018 03:56   [place] Ekg Site Rite  Result Date: 11/30/2018 If Site Rite image not attached, placement could not be confirmed due to current cardiac rhythm.   LOS: 13 days   Author:  Berle Mull  [facility]  To reach On-call, see care teams to locate the attending and reach out to them via [email or web address]. If 7PM-7AM, please contact night-coverage If you still have difficulty reaching the attending provider, please page the [facility] (Director on Call) for [facility] on amion for assistance.   12/01/2018, 7:49 PM

## 2018-12-01 NOTE — Progress Notes (Signed)
ANTICOAGULATION CONSULT NOTE - Follow Up Consult  Pharmacy Consult for Heparin Indication: atrial fibrillation  (Hx DVT on PTA Xarelto)  Allergies  Allergen Reactions  . Sulfonamide Derivatives Itching    Patient Measurements: Height: 5\' 3"  (160 cm) Weight: 260 lb 12.9 oz (118.3 kg) IBW/kg (Calculated) : 52.4 Heparin Dosing Weight: 80.5 kg  Vital Signs: Temp: 98.6 F (37 C) (08/04 1531) Temp Source: Axillary (08/04 1531) BP: 106/58 (08/04 1800) Pulse Rate: 86 (08/04 1800)  Labs: Recent Labs    11/29/18 0435 11/29/18 2230 11/30/18 0155 11/30/18 0227 11/30/18 0457 12/01/18 0535 12/01/18 1824  HGB 9.3*  --   --  10.9* 10.1* 9.6*  --   HCT 31.3*  --   --  32.0* 32.7* 31.9*  --   PLT PLATELET CLUMPS NOTED ON SMEAR, UNABLE TO ESTIMATE  --   --   --  94* 81*  --   HEPARINUNFRC  --   --  0.53  --   --  0.77* 0.52  CREATININE 1.22* 1.30*  --   --  1.43* 1.34*  --     Estimated Creatinine Clearance: 50 mL/min (A) (by C-G formula based on SCr of 1.34 mg/dL (H)).   Assessment: 45 yoF admitted on 7/22 with PMH of afib and DVT on chronic Xarelto.  She was transitioned to Heparin IV for possible chest tube (not needed), and resume Xarelto on 7/29.  She then had black stools on 7/30 and Xarelto was d/c (last dose on 7/29 at 1400).  Pharmacy is now consulted to resume Heparin IV.  Currently on IV heparin at 750 units/hr. Heparin level therapeutic (0.52). H/H low stable, Plt down to 81k.   Goal of Therapy:  Heparin level 0.3-0.7 units/ml Monitor platelets by anticoagulation protocol: Yes   Plan:  Continue heparin IV infusion at 750 units/hr Daily heparin level and CBC Monitor platelet trend closely   Sherlon Handing, PharmD, BCPS CGV Clinical pharmacist phone [phone] 12/01/2018 7:58 PM

## 2018-12-01 NOTE — Progress Notes (Signed)
Physical Therapy Discharge Patient Details Name: DONNELLE RUBEY MRN: [id number] DOB: Sep 15, 1950 Today's Date: 12/01/2018 Time:  -     Patient discharged from PT services secondary to medical issues, remains sedated on vent. PLEASE re-order PT to resume therapy services when medically ready. Thank You   Claretha Cooper 12/01/2018, 6:57 AM

## 2018-12-01 NOTE — Progress Notes (Signed)
Went to place midline for patient and to DC central line both had already occurred on yesterday, order was not completed.

## 2018-12-01 NOTE — Progress Notes (Signed)
ANTICOAGULATION CONSULT NOTE - Follow Up Consult  Pharmacy Consult for Heparin Indication: atrial fibrillation  (Hx DVT on PTA Xarelto)  Allergies  Allergen Reactions  . Sulfonamide Derivatives Itching    Patient Measurements: Height: 5\' 3"  (160 cm) Weight: 260 lb 12.9 oz (118.3 kg) IBW/kg (Calculated) : 52.4 Heparin Dosing Weight: 80.5 kg  Vital Signs: Temp: 99.9 F (37.7 C) (08/04 0800) Temp Source: Axillary (08/04 0800) BP: 116/70 (08/04 0742) Pulse Rate: 92 (08/04 0700)  Labs: Recent Labs    11/28/18 1030  11/29/18 0306  11/29/18 0435 11/29/18 2230 11/30/18 0155 11/30/18 0227 11/30/18 0457 12/01/18 0535  HGB  --   --   --    < > 9.3*  --   --  10.9* 10.1* 9.6*  HCT  --   --   --    < > 31.3*  --   --  32.0* 32.7* 31.9*  PLT  --   --   --   --  PLATELET CLUMPS NOTED ON SMEAR, UNABLE TO ESTIMATE  --   --   --  94* 81*  APTT 22*  --   --   --   --   --   --   --   --   --   HEPARINUNFRC <0.10*   < > 0.43  --   --   --  0.53  --   --  0.77*  CREATININE  --    < >  --   --  1.22* 1.30*  --   --  1.43* 1.34*   < > = values in this interval not displayed.    Estimated Creatinine Clearance: 50 mL/min (A) (by C-G formula based on SCr of 1.34 mg/dL (H)).   Medications:  Scheduled:  . chlorhexidine  15 mL Mouth/Throat BID  . clonazePAM  1 mg Oral BID  . dexamethasone (DECADRON) injection  4 mg Intravenous Q24H  . digoxin  0.125 mg Intravenous Daily  . feeding supplement (PRO-STAT SUGAR FREE 64)  30 mL Per Tube QID  . feeding supplement (VITAL AF 1.2 CAL)  1,000 mL Per Tube Q24H  . fentaNYL (SUBLIMAZE) injection  50 mcg Intravenous Once  . ferrous sulfate  300 mg Per Tube TID WC  . free water  300 mL Per Tube Q6H  . gabapentin  100 mg Per Tube Q8H  . insulin aspart  0-20 Units Subcutaneous Q4H  . insulin aspart  10 Units Subcutaneous Q4H  . insulin detemir  50 Units Subcutaneous BID  . levothyroxine  112 mcg Oral Q0600  . mouth rinse  15 mL Mouth Rinse 10 times  per day  . metoprolol tartrate  2.5 mg Intravenous Q6H  . multivitamin with minerals  1 tablet Per Tube Daily  . oxyCODONE  5 mg Per Tube Q6H  . pantoprazole sodium  40 mg Per Tube BID  . polyethylene glycol  17 g Per Tube Daily  . sodium chloride flush  10-40 mL Intracatheter Q12H  . vitamin C  500 mg Per Tube Daily  . zinc sulfate  220 mg Per Tube Daily   Infusions:  . sodium chloride Stopped (11/30/18 1511)  . amiodarone 30 mg/hr (12/01/18 0600)  . diltiazem (CARDIZEM) infusion Stopped (11/30/18 0604)  . fentaNYL infusion INTRAVENOUS 100 mcg/hr (12/01/18 0600)  . heparin 800 Units/hr (12/01/18 0226)  . midazolam 2 mg/hr (12/01/18 0272)  . phenylephrine (NEO-SYNEPHRINE) Adult infusion Stopped (11/30/18 2135)  . [START ON 12/02/2018] vancomycin  Assessment: 71 yoF admitted on 7/22 with PMH of afib and DVT on chronic Xarelto.  She was transitioned to Heparin IV for possible chest tube (not needed), and resume Xarelto on 7/29.  She then had black stools on 7/30 and Xarelto was d/c (last dose on 7/29 at 1400).  Pharmacy is now consulted to resume Heparin IV.  Currently on IV heparin at 800 units/hr. AM heparin level is now slightly supratherapeutic. H/H low stable, Plt down to 81k.   Goal of Therapy:  Heparin level 0.3-0.7 units/ml Monitor platelets by anticoagulation protocol: Yes   Plan:  Decrease heparin IV infusion to 750 units/hr F/u 8 hr HL  Daily heparin level and CBC Monitor platelet trend closely   Albertina Parr, PharmD., BCPS Clinical Pharmacist Clinical phone for 12/01/18 until 5pm: [phone]

## 2018-12-01 NOTE — Progress Notes (Signed)
[facility]    Date of Admission:  11/03/2018      ID: [name] [name] is a 68 y.o. female with severe covid 19 disease, respiratory failure on vent c/b mrsa bacteremia Active Problems:   Acute respiratory failure with hypoxemia (HCC)   Pneumonia due to COVID-19 virus   SOB (shortness of breath)   Ventricular tachyarrhythmia (HCC)   Intubation of airway performed without difficulty   Subcutaneous emphysema (HCC)   Pneumomediastinum (HCC)   Pressure injury of skin    Subjective: remains intubated, fever curve trending downward; RN reports no pressure ulcers/wounds; has new onset hematuria  ROS: unable to obtain due to being sedated, intubated Medications:  . chlorhexidine  15 mL Mouth/Throat BID  . clonazePAM  1 mg Oral BID  . digoxin  0.125 mg Intravenous Daily  . feeding supplement (PRO-STAT SUGAR FREE 64)  30 mL Per Tube QID  . feeding supplement (VITAL AF 1.2 CAL)  1,000 mL Per Tube Q24H  . fentaNYL (SUBLIMAZE) injection  50 mcg Intravenous Once  . ferrous sulfate  300 mg Per Tube TID WC  . free water  300 mL Per Tube Q6H  . gabapentin  100 mg Per Tube Q8H  . insulin aspart  0-20 Units Subcutaneous Q4H  . insulin aspart  10 Units Subcutaneous Q4H  . insulin detemir  50 Units Subcutaneous BID  . levothyroxine  112 mcg Oral Q0600  . mouth rinse  15 mL Mouth Rinse 10 times per day  . metoprolol tartrate  2.5 mg Intravenous Q6H  . multivitamin with minerals  1 tablet Per Tube Daily  . oxyCODONE  5 mg Per Tube Q6H  . pantoprazole sodium  40 mg Per Tube BID  . polyethylene glycol  17 g Per Tube Daily  . sodium chloride flush  10-40 mL Intracatheter Q12H  . vitamin C  500 mg Per Tube Daily  . zinc sulfate  220 mg Per Tube Daily    Objective: Vital signs in last 24 hours: Temp:  [98.1 F (36.7 C)-101 F (38.3 C)] 98.6 F (37 C) (08/04 1531) Pulse Rate:  [86-102] 86 (08/04 1800) Resp:  [18-36] 19 (08/04 1800) BP: (103-141)/(51-104)  106/58 (08/04 1800) SpO2:  [89 %-96 %] 93 % (08/04 1800) FiO2 (%):  [50 %-60 %] 50 % (08/04 1521) Weight:  [118.3 kg] 118.3 kg (08/04 0500) Physical Exam  Constitutional:  appears well-developed and well-nourished. No distress.  HENT: [place]/AT, PERRLA, no scleral icterus, ng in place Mouth/Throat: Oropharynx is clear and moist. No oropharyngeal exudate. OETT in place Cardiovascular: Normal rate, regular rhythm and normal heart sounds. Exam reveals no gallop and no friction rub.  No murmur heard.  Pulmonary/Chest: Effort normal and breath sounds normal. No respiratory distress.  has no wheezes.  Abdominal: Soft. Bowel sounds are decreased. exhibits no distension. There is no tenderness.  Lymphadenopathy: no cervical adenopathy. No axillary adenopathy Ext: edema to upper extremities bilaterall Skin: Scattered echymosis ; 2 piv in place    Lab Results Recent Labs    11/30/18 0457 12/01/18 0535  WBC 13.6* 11.3*  HGB 10.1* 9.6*  HCT 32.7* 31.9*  NA 145 142  K 4.3 4.2  CL 98 102  CO2 32 33*  BUN 98* 99*  CREATININE 1.43* 1.34*   Liver Panel Recent Labs    11/30/18 0457 12/01/18 0535  PROT 4.9* 4.7*  ALBUMIN 2.4* 2.3*  AST 63* 88*  ALT 39 70*  ALKPHOS 75 82  BILITOT 1.1 1.1   Sedimentation Rate No results for input(s): ESRSEDRATE in the last 72 hours. C-Reactive Protein Recent Labs    11/30/18 0457 12/01/18 0535  CRP <0.8 1.5*    Microbiology: 8/2 blood cx MRSA Studies/Results: Dg Chest Port 1 View  Result Date: 11/30/2018 CLINICAL DATA:  Respiratory difficulty EXAM: PORTABLE CHEST 1 VIEW COMPARISON:  11/29/2018 FINDINGS: Cardiac shadow is stable. Endotracheal tube, left jugular central line and feeding catheter are again seen and stable. Elevation of the right hemidiaphragm is again seen. Patchy parenchymal opacities are noted bilaterally. These are stable in appearance from the prior exam. No bony abnormality is seen. IMPRESSION: Stable appearance when compared with  the previous day. Electronically Signed   By: Inez Catalina M.D.   On: 11/30/2018 03:56   [place] Ekg Site Rite  Result Date: 11/30/2018 If Site Rite image not attached, placement could not be confirmed due to current cardiac rhythm.    Assessment/Plan: MRSA bacteremia = Continue on vancomycin. Continue to renally dose. Will repeat blood cx today since picc line/CL removed. Appears improving with leukocytosis and fever. Would like 48hr line holiday before new midline place. Awaiting TTE read.  [facility] Cell: [phone] Pager: [phone]  12/01/2018, 7:33 PM

## 2018-12-01 NOTE — Progress Notes (Signed)
NAME:  [name] [name], MRN:  [id number], DOB:  01-09-1951, LOS: 28 ADMISSION DATE:  10/29/2018, CONSULTATION DATE:  July 23 REFERRING MD:  Candiss Norse, CHIEF COMPLAINT:  dyspnea   Brief History   68 year old female admitted for severe acute respiratory failure with hypoxemia due to COVID-19 pneumonia  Past Medical History  Diabetes mellitus type 2 Obesity Asthma, has been on inhalers in the past, never smoked cigarettes CKD stage III Chronic A. fib History of spine surgery History of Graves' disease, now hypothyroid History of stroke History of DVT Lives in an assisted living facility [facility] Events   July 22 admitted July 23 intubated July 24 prone positioning July 26 subcutaneous air, no pneumothorax, afib with RVR again July 29 decreasing sedation, diuresis July 30 decreasing sedation July 31 holding additional diuresis 8/2 weaning on pressure support  Consults:  PCCM  Procedures:  7/23 ETT > 7/23 L IJ CVL >8/3 8/3 midline/PIV>   Significant Diagnostic Tests:  7/24 Echo > LVEF > 65%, impaired relaxation, RV normal systolic function, mild thickening of aortic valve 8/4 repeat Echo>   Micro Data:  July 22 SARS COV 2 Positive  Antimicrobials:  July 22 remdesivir> 7/26 July 22 decadron July 22> 23 Toclizumab   Interim history/subjective:   CVL pulled yesterday Echo today MRSA bacteremia  Objective   Blood pressure 116/70, pulse 92, temperature (!) 100.9 F (38.3 C), temperature source Axillary, resp. rate (!) 29, height 5\' 3"  (1.6 m), weight 118.3 kg, SpO2 92 %. CVP:  [4 mmHg-5 mmHg] 4 mmHg  Vent Mode: PCV FiO2 (%):  [60 %-70 %] 60 % Set Rate:  [24 bmp] 24 bmp PEEP:  [10 cmH20] 10 cmH20 Plateau Pressure:  [21 cmH20-27 cmH20] 27 cmH20   Intake/Output Summary (Last 24 hours) at 12/01/2018 0729 Last data filed at 12/01/2018 4580 Gross per 24 hour  Intake 3723.09 ml  Output 1850 ml  Net 1873.09 ml   Filed Weights   11/29/18 0500  11/30/18 0500 12/01/18 0500  Weight: 115.7 kg 119.6 kg 118.3 kg    Examination:  General:  In bed on vent HENT: NCAT ETT in place PULM: CTA B, vent supported breathing CV: RRR, no mgr GI: BS+, soft, nontender MSK: normal bulk and tone Neuro: sedated on vent   8/3 CXR images independently reviewed: bibasilar infiltrates, no clear pneumomediastinum  ABG    Component Value Date/Time   PHART 7.480 (H) 11/30/2018 0227   PCO2ART 46.4 11/30/2018 0227   PO2ART 57.0 (L) 11/30/2018 0227   HCO3 34.4 (H) 11/30/2018 0227   TCO2 36 (H) 11/30/2018 0227   ACIDBASEDEF 3.0 (H) 11/21/2018 0628   O2SAT 90.0 11/30/2018 0227     Resolved Hospital Problem list    Assessment & Plan:  ARDS due to COVID-19 pneumonia: no significant improvement in the last week Vent day 13 8/4 8/4: wean PEEP/FiO2 as tolerated to maintain SaO2 85% or above, no pressure support wean given high vent settings Continue mechanical ventilation per ARDS protocol Target TVol 6-8cc/kgIBW Target Plateau Pressure < 30cm H20 Target driving pressure less than 15 cm of water Target PaO2 55-65: titrate PEEP/FiO2 per protocol As long as PaO2 to FiO2 ratio is less than 1:150 position in prone position for 16 hours a day Check CVP daily if CVL in place Target CVP less than 4, diurese as necessary Ventilator associated pneumonia prevention protocol  Pneumomediastinum: stable on 8/3 CXR, no clinical evidence of progression Continue to monitor for hemodynamic change, CXR prn at  this point  MRSA bacteremia: Lines out Echo vanc Repeat cultures per ID/TRH  Ventricular tachycardia noted on July 23' Atrial fib with RVR > improved again on 8/4 Tele Amiodarone infusion to continue Scheduled metoprolol Digoxin  Need for sedation for mechanical ventilation/dyssynchrony RASS target -2 Daily WUA PAD protocol: fentanyl/versed infusion Continue oxycodone/clonazepam  Prognosis: guarded, hasn't made significant change for worse  or better in terms of ARDS, MRSA bacteremia concerning.   Best practice:  Diet: Tube feeding Pain/Anxiety/Delirium protocol (if indicated): as above VAP protocol (if indicated): yes DVT prophylaxis: heparin infusion GI prophylaxis: Pantoprazole for stress ulcer prophylaxis Glucose control: SSI Mobility: bed rest Code Status: limited code Family Communication: will discuss with TRH Disposition: remian in ICU  Labs   CBC: Recent Labs  Lab 11/27/18 0450 11/28/18 0430 11/29/18 0435 11/30/18 0227 11/30/18 0457 12/01/18 0535  WBC 27.5* 23.6* 14.5*  --  13.6* 11.3*  NEUTROABS  --  19.9* 11.9*  --  12.5* 9.9*  HGB 10.6* 9.6* 9.3* 10.9* 10.1* 9.6*  HCT 35.3* 31.8* 31.3* 32.0* 32.7* 31.9*  MCV 91.0 90.3 90.2  --  91.1 93.0  PLT 139* 122* PLATELET CLUMPS NOTED ON SMEAR, UNABLE TO ESTIMATE  --  94* 81*    Basic Metabolic Panel: Recent Labs  Lab 11/27/18 0450 11/28/18 0430 11/28/18 1720 11/29/18 0435 11/29/18 2230 11/30/18 0227 11/30/18 0457 12/01/18 0535  NA 151* 148* 145 145 146* 142 145 142  K 5.3* 4.4 4.3 3.9 4.5 4.5 4.3 4.2  CL 109 106 101 102 100  --  98 102  CO2 32 34* 31 34* 32  --  32 33*  GLUCOSE 180* 288* 327* 204* 214*  --  232* 191*  BUN 149* 114* 95* 89* 91*  --  98* 99*  CREATININE 2.15* 1.44* 1.23* 1.22* 1.30*  --  1.43* 1.34*  CALCIUM 7.8* 7.6* 7.2* 7.6* 7.7*  --  7.5* 7.2*  MG 3.1* 2.7*  --  2.7*  --   --  2.6* 2.5*  PHOS  --   --   --   --  3.2  --   --   --    GFR: Estimated Creatinine Clearance: 50 mL/min (A) (by C-G formula based on SCr of 1.34 mg/dL (H)). Recent Labs  Lab 11/28/18 0430 11/29/18 0435 11/29/18 2255 11/30/18 0457 12/01/18 0535  PROCALCITON  --   --  0.26  --   --   WBC 23.6* 14.5*  --  13.6* 11.3*    Liver Function Tests: Recent Labs  Lab 11/27/18 0450 11/28/18 0430 11/29/18 0435 11/30/18 0457 12/01/18 0535  AST 48* 57* 57* 63* 88*  ALT 36 37 33 39 70*  ALKPHOS 74 77 72 75 82  BILITOT 0.9 0.7 0.8 1.1 1.1  PROT 5.2*  4.9* 5.0* 4.9* 4.7*  ALBUMIN 2.4* 2.3* 2.3* 2.4* 2.3*   No results for input(s): LIPASE, AMYLASE in the last 168 hours. No results for input(s): AMMONIA in the last 168 hours.  ABG    Component Value Date/Time   PHART 7.480 (H) 11/30/2018 0227   PCO2ART 46.4 11/30/2018 0227   PO2ART 57.0 (L) 11/30/2018 0227   HCO3 34.4 (H) 11/30/2018 0227   TCO2 36 (H) 11/30/2018 0227   ACIDBASEDEF 3.0 (H) 11/21/2018 0628   O2SAT 90.0 11/30/2018 0227     Coagulation Profile: No results for input(s): INR, PROTIME in the last 168 hours.  Cardiac Enzymes: No results for input(s): CKTOTAL, CKMB, CKMBINDEX, TROPONINI in the last 168 hours.  HbA1C: Hgb A1c MFr Bld  Date/Time Value Ref Range Status  11/27/2018 08:00 PM 7.0 (H) 4.8 - 5.6 % Final    Comment:    (NOTE) Pre diabetes:          5.7%-6.4% Diabetes:              >6.4% Glycemic control for   <7.0% adults with diabetes   10/18/2016 10:47 AM 6.0 (H) 4.8 - 5.6 % Final    Comment:    (NOTE)         Pre-diabetes: 5.7 - 6.4         Diabetes: >6.4         Glycemic control for adults with diabetes: <7.0     CBG: Recent Labs  Lab 11/30/18 1153 11/30/18 1516 11/30/18 1952 12/01/18 0023 12/01/18 0324  GLUCAP 201* 210* 208* 205* 181*    Critical care time: 33 minutes    Roselie Awkward, MD [place] [facility] Pager: [phone] Cell: [phone] If no response, call [phone]

## 2018-12-02 ENCOUNTER — Inpatient Hospital Stay (HOSPITAL_COMMUNITY): Payer: Medicare Other

## 2018-12-02 DIAGNOSIS — R609 Edema, unspecified: Secondary | ICD-10-CM

## 2018-12-02 DIAGNOSIS — J80 Acute respiratory distress syndrome: Secondary | ICD-10-CM

## 2018-12-02 LAB — CBC WITH DIFFERENTIAL/PLATELET
Abs Immature Granulocytes: 0.05 10*3/uL (ref 0.00–0.07)
Basophils Absolute: 0 10*3/uL (ref 0.0–0.1)
Basophils Relative: 0 %
Eosinophils Absolute: 0 10*3/uL (ref 0.0–0.5)
Eosinophils Relative: 0 %
HCT: 27.6 % — ABNORMAL LOW (ref 36.0–46.0)
Hemoglobin: 8.4 g/dL — ABNORMAL LOW (ref 12.0–15.0)
Immature Granulocytes: 1 %
Lymphocytes Relative: 9 %
Lymphs Abs: 0.6 10*3/uL — ABNORMAL LOW (ref 0.7–4.0)
MCH: 29 pg (ref 26.0–34.0)
MCHC: 30.4 g/dL (ref 30.0–36.0)
MCV: 95.2 fL (ref 80.0–100.0)
Monocytes Absolute: 0.2 10*3/uL (ref 0.1–1.0)
Monocytes Relative: 2 %
Neutro Abs: 5.9 10*3/uL (ref 1.7–7.7)
Neutrophils Relative %: 88 %
Platelets: 65 10*3/uL — ABNORMAL LOW (ref 150–400)
RBC: 2.9 MIL/uL — ABNORMAL LOW (ref 3.87–5.11)
RDW: 18.6 % — ABNORMAL HIGH (ref 11.5–15.5)
WBC: 6.7 10*3/uL (ref 4.0–10.5)
nRBC: 21 % — ABNORMAL HIGH (ref 0.0–0.2)

## 2018-12-02 LAB — GLUCOSE, CAPILLARY
Glucose-Capillary: 179 mg/dL — ABNORMAL HIGH (ref 70–99)
Glucose-Capillary: 129 mg/dL — ABNORMAL HIGH (ref 70–99)
Glucose-Capillary: 113 mg/dL — ABNORMAL HIGH (ref 70–99)
Glucose-Capillary: 108 mg/dL — ABNORMAL HIGH (ref 70–99)
Glucose-Capillary: 164 mg/dL — ABNORMAL HIGH (ref 70–99)
Glucose-Capillary: 150 mg/dL — ABNORMAL HIGH (ref 70–99)

## 2018-12-02 LAB — COMPREHENSIVE METABOLIC PANEL
ALT: 97 U/L — ABNORMAL HIGH (ref 0–44)
AST: 85 U/L — ABNORMAL HIGH (ref 15–41)
Albumin: 2.6 g/dL — ABNORMAL LOW (ref 3.5–5.0)
Alkaline Phosphatase: 61 U/L (ref 38–126)
Anion gap: 7 (ref 5–15)
BUN: 85 mg/dL — ABNORMAL HIGH (ref 8–23)
CO2: 35 mmol/L — ABNORMAL HIGH (ref 22–32)
Calcium: 7.4 mg/dL — ABNORMAL LOW (ref 8.9–10.3)
Chloride: 101 mmol/L (ref 98–111)
Creatinine, Ser: 1.13 mg/dL — ABNORMAL HIGH (ref 0.44–1.00)
GFR calc Af Amer: 58 mL/min — ABNORMAL LOW (ref >60)
GFR calc non Af Amer: 50 mL/min — ABNORMAL LOW (ref >60)
Glucose, Bld: 181 mg/dL — ABNORMAL HIGH (ref 70–99)
Potassium: 4.2 mmol/L (ref 3.5–5.1)
Sodium: 143 mmol/L (ref 135–145)
Total Bilirubin: 1.3 mg/dL — ABNORMAL HIGH (ref 0.3–1.2)
Total Protein: 4.7 g/dL — ABNORMAL LOW (ref 6.5–8.1)

## 2018-12-02 LAB — LACTATE DEHYDROGENASE: LDH: 1057 U/L — ABNORMAL HIGH (ref 98–192)

## 2018-12-02 LAB — CULTURE, BLOOD (ROUTINE X 2)
Special Requests: ADEQUATE
Special Requests: ADEQUATE

## 2018-12-02 LAB — MAGNESIUM: Magnesium: 2.5 mg/dL — ABNORMAL HIGH (ref 1.7–2.4)

## 2018-12-02 LAB — TYPE AND SCREEN
ABO/RH(D): A POS
Antibody Screen: NEGATIVE

## 2018-12-02 LAB — IMMATURE PLATELET FRACTION: Immature Platelet Fraction: 7.6 % (ref 1.2–8.6)

## 2018-12-02 LAB — C-REACTIVE PROTEIN: CRP: 1.6 mg/dL — ABNORMAL HIGH (ref <1.0)

## 2018-12-02 LAB — DIGOXIN LEVEL: Digoxin Level: 1 ng/mL (ref 0.8–2.0)

## 2018-12-02 LAB — HEPARIN LEVEL (UNFRACTIONATED): Heparin Unfractionated: 0.39 IU/mL (ref 0.30–0.70)

## 2018-12-02 LAB — D-DIMER, QUANTITATIVE: D-Dimer, Quant: 3.81 ug/mL-FEU — ABNORMAL HIGH (ref 0.00–0.50)

## 2018-12-02 MED ORDER — SODIUM CHLORIDE 0.9% IV SOLUTION: Freq: Once | INTRAVENOUS | Status: DC

## 2018-12-02 MED ORDER — MIDAZOLAM HCL 2 MG/2ML IJ SOLN
1.0000 mg | INTRAMUSCULAR | Status: DC | PRN
  Administered 2018-12-03 – 2018-12-09 (×7): 2 mg via INTRAVENOUS
  Filled 2018-12-02 (×8): qty 2

## 2018-12-02 MED ORDER — VANCOMYCIN HCL IN DEXTROSE 1-5 GM/200ML-% IV SOLN
1000.0000 mg | INTRAVENOUS | Status: DC
  Administered 2018-12-02 – 2018-12-03 (×2): 1000 mg via INTRAVENOUS
  Filled 2018-12-02 (×2): qty 200

## 2018-12-02 NOTE — Progress Notes (Signed)
NAME:  [name] [name], MRN:  [id number], DOB:  Dec 13, 1950, LOS: 67 ADMISSION DATE:  11/13/2018, CONSULTATION DATE:  July 23 REFERRING MD:  Candiss Norse, CHIEF COMPLAINT:  dyspnea   Brief History   68 year old female admitted for severe acute respiratory failure with hypoxemia due to COVID-19 pneumonia  Past Medical History  Diabetes mellitus type 2 Obesity Asthma, has been on inhalers in the past, never smoked cigarettes CKD stage III Chronic A. fib History of spine surgery History of Graves' disease, now hypothyroid History of stroke History of DVT Lives in an assisted living facility [facility] Events   July 22 admitted July 23 intubated July 24 prone positioning July 26 subcutaneous air, no pneumothorax, afib with RVR again July 29 decreasing sedation, diuresis July 30 decreasing sedation July 31 holding additional diuresis 8/2 weaning on pressure support  Consults:  PCCM  Procedures:  7/23 ETT > 7/23 L IJ CVL >8/3 8/3 midline/PIV>   Significant Diagnostic Tests:  7/24 Echo > LVEF > 65%, impaired relaxation, RV normal systolic function, mild thickening of aortic valve 8/4 repeat Echo> LVEF normal > 65%, no clear endocarditis  Micro Data:  July 22 SARS COV 2 Positive 8/2 blood > MRSA 8/4 blood >  8/5 blood > pending  Antimicrobials:  July 22 remdesivir> 7/26 July 22 decadron July 22> 23 Toclizumab   Interim history/subjective:   Vent weaned down some Hematuria   Objective   Blood pressure (!) 123/56, pulse 82, temperature 98.1 F (36.7 C), temperature source Oral, resp. rate (!) 27, height 5\' 3"  (1.6 m), weight 118.3 kg, SpO2 (!) 88 %.    Vent Mode: PCV FiO2 (%):  [50 %-60 %] 50 % Set Rate:  [24 bmp] 24 bmp PEEP:  [10 cmH20] 10 cmH20 Plateau Pressure:  [21 cmH20-30 cmH20] 30 cmH20   Intake/Output Summary (Last 24 hours) at 12/02/2018 0741 Last data filed at 12/02/2018 0400 Gross per 24 hour  Intake 1623.85 ml  Output 2550 ml  Net  -926.15 ml   Filed Weights   11/29/18 0500 11/30/18 0500 12/01/18 0500  Weight: 115.7 kg 119.6 kg 118.3 kg    Examination:  General:  In bed on vent HENT: NCAT ETT in place PULM: CTA B, vent supported breathing CV: RRR, no mgr GI: BS+, soft, nontender MSK: normal bulk and tone Neuro: sedated on vent   8/3 CXR images independently reviewed: bibasilar infiltrates, no clear pneumomediastinum  ABG    Component Value Date/Time   PHART 7.480 (H) 11/30/2018 0227   PCO2ART 46.4 11/30/2018 0227   PO2ART 57.0 (L) 11/30/2018 0227   HCO3 34.4 (H) 11/30/2018 0227   TCO2 36 (H) 11/30/2018 0227   ACIDBASEDEF 3.0 (H) 11/21/2018 0628   O2SAT 90.0 11/30/2018 0227     Resolved Hospital Problem list    Assessment & Plan:  ARDS due to COVID-19 pneumonia: no significant improvement in the last week Vent day 13 8/4 8/4 consider pressure support wean today, continue to wean PEEP/FiO2 per protocol, decrease sedation Continue mechanical ventilation per ARDS protocol Target TVol 6-8cc/kgIBW Target Plateau Pressure < 30cm H20 Target driving pressure less than 15 cm of water Target PaO2 55-65: titrate PEEP/FiO2 per protocol As long as PaO2 to FiO2 ratio is less than 1:150 position in prone position for 16 hours a day Check CVP daily if CVL in place Target CVP less than 4, diurese as necessary Ventilator associated pneumonia prevention protocol  Pneumomediastinum: stable on 8/3 CXR, no clinical evidence of progression Continue  to monitor with prn CXR  MRSA bacteremia: F/u repeat cultures before any more CVL's Repeat cultures per ID/TRH  Ventricular tachycardia noted on July 23' Atrial fib with RVR > improved again on 8/4 Tele Amiodarone infusion to continue Scheduled metoprolol Digoxin  Need for sedation for mechanical ventilation/dyssynchrony RASS target: -2 Daily WUA Wean off midazolam   Prognosis: guarded, but finally making some improvement in ventilator settings today   Best practice:  Diet: Tube feeding Pain/Anxiety/Delirium protocol (if indicated): as above VAP protocol (if indicated): yes DVT prophylaxis: heparin infusion GI prophylaxis: Pantoprazole for stress ulcer prophylaxis Glucose control: SSI Mobility: bed rest Code Status: limited code Family Communication: will discuss with TRH Disposition: remian in ICU  Labs   CBC: Recent Labs  Lab 11/28/18 0430 11/29/18 0435 11/30/18 0227 11/30/18 0457 12/01/18 0535 12/02/18 0510  WBC 23.6* 14.5*  --  13.6* 11.3* 6.7  NEUTROABS 19.9* 11.9*  --  12.5* 9.9* 5.9  HGB 9.6* 9.3* 10.9* 10.1* 9.6* 8.4*  HCT 31.8* 31.3* 32.0* 32.7* 31.9* 27.6*  MCV 90.3 90.2  --  91.1 93.0 95.2  PLT 122* PLATELET CLUMPS NOTED ON SMEAR, UNABLE TO ESTIMATE  --  94* 81* 65*    Basic Metabolic Panel: Recent Labs  Lab 11/28/18 0430  11/29/18 0435 11/29/18 2230 11/30/18 0227 11/30/18 0457 12/01/18 0535 12/02/18 0510  NA 148*   < > 145 146* 142 145 142 143  K 4.4   < > 3.9 4.5 4.5 4.3 4.2 4.2  CL 106   < > 102 100  --  98 102 101  CO2 34*   < > 34* 32  --  32 33* 35*  GLUCOSE 288*   < > 204* 214*  --  232* 191* 181*  BUN 114*   < > 89* 91*  --  98* 99* 85*  CREATININE 1.44*   < > 1.22* 1.30*  --  1.43* 1.34* 1.13*  CALCIUM 7.6*   < > 7.6* 7.7*  --  7.5* 7.2* 7.4*  MG 2.7*  --  2.7*  --   --  2.6* 2.5* 2.5*  PHOS  --   --   --  3.2  --   --   --   --    < > = values in this interval not displayed.   GFR: Estimated Creatinine Clearance: 59.3 mL/min (A) (by C-G formula based on SCr of 1.13 mg/dL (H)). Recent Labs  Lab 11/29/18 0435 11/29/18 2255 11/30/18 0457 12/01/18 0535 12/02/18 0510  PROCALCITON  --  0.26  --   --   --   WBC 14.5*  --  13.6* 11.3* 6.7    Liver Function Tests: Recent Labs  Lab 11/28/18 0430 11/29/18 0435 11/30/18 0457 12/01/18 0535 12/02/18 0510  AST 57* 57* 63* 88* 85*  ALT 37 33 39 70* 97*  ALKPHOS 77 72 75 82 61  BILITOT 0.7 0.8 1.1 1.1 1.3*  PROT 4.9* 5.0* 4.9* 4.7*  4.7*  ALBUMIN 2.3* 2.3* 2.4* 2.3* 2.6*   No results for input(s): LIPASE, AMYLASE in the last 168 hours. No results for input(s): AMMONIA in the last 168 hours.  ABG    Component Value Date/Time   PHART 7.480 (H) 11/30/2018 0227   PCO2ART 46.4 11/30/2018 0227   PO2ART 57.0 (L) 11/30/2018 0227   HCO3 34.4 (H) 11/30/2018 0227   TCO2 36 (H) 11/30/2018 0227   ACIDBASEDEF 3.0 (H) 11/21/2018 0628   O2SAT 90.0 11/30/2018 0227     Coagulation  Profile: No results for input(s): INR, PROTIME in the last 168 hours.  Cardiac Enzymes: No results for input(s): CKTOTAL, CKMB, CKMBINDEX, TROPONINI in the last 168 hours.  HbA1C: Hgb A1c MFr Bld  Date/Time Value Ref Range Status  11/17/2018 08:00 PM 7.0 (H) 4.8 - 5.6 % Final    Comment:    (NOTE) Pre diabetes:          5.7%-6.4% Diabetes:              >6.4% Glycemic control for   <7.0% adults with diabetes   10/18/2016 10:47 AM 6.0 (H) 4.8 - 5.6 % Final    Comment:    (NOTE)         Pre-diabetes: 5.7 - 6.4         Diabetes: >6.4         Glycemic control for adults with diabetes: <7.0     CBG: Recent Labs  Lab 12/01/18 1217 12/01/18 1544 12/01/18 1946 12/01/18 2320 12/02/18 0347  GLUCAP 182* 179* 198* 201* 179*    Critical care time: 35 minutes    Roselie Awkward, MD [place] [facility] Pager: [phone] Cell: [phone] If no response, call [phone]

## 2018-12-02 NOTE — Progress Notes (Signed)
Assisted sister to facetime with patient.

## 2018-12-02 NOTE — Progress Notes (Signed)
Venous duplex lower extremities  has been completed. Refer to [facility] under chart review to view preliminary results.   12/02/2018  9:57 AM Ladislaus Repsher, Bonnye Fava

## 2018-12-02 NOTE — Progress Notes (Signed)
Pharmacy Antibiotic Note  [name] [name] is a 68 y.o. female admitted on 11/15/2018 with MRSA bacteremia.  Pharmacy has been consulted for vancomycin dosing. WBC has normalized. Fevers resolved. TTE inconclusive for vegetation. Will likely need TEE. SCr has improved.   Plan: -Increase Vancomycin to 1 gm IV Q 24 hours. Goal AUC 400-550. Expected AUC: 456 SCr used: 1.13   Height: 5\' 3"  (160 cm) Weight: 260 lb 12.9 oz (118.3 kg) IBW/kg (Calculated) : 52.4  Temp (24hrs), Avg:98.2 F (36.8 C), Min:97.7 F (36.5 C), Max:98.8 F (37.1 C)  Recent Labs  Lab 11/28/18 0430  11/29/18 0435 11/29/18 2230 11/30/18 0457 12/01/18 0535 12/02/18 0510  WBC 23.6*  --  14.5*  --  13.6* 11.3* 6.7  CREATININE 1.44*   < > 1.22* 1.30* 1.43* 1.34* 1.13*   < > = values in this interval not displayed.    Estimated Creatinine Clearance: 59.3 mL/min (A) (by C-G formula based on SCr of 1.13 mg/dL (H)).    Allergies  Allergen Reactions  . Sulfonamide Derivatives Itching    Antimicrobials this admission: Vancomycin 8/3 >>   Dose adjustments this admission: None   Microbiology results: 8/2 BCx2: 4/4 MRSA   Thank you for allowing pharmacy to be a part of this patient's care.  Albertina Parr, PharmD., BCPS Clinical Pharmacist Clinical phone for 12/02/18 until 5pm: [phone]

## 2018-12-02 NOTE — Progress Notes (Signed)
Nutrition Follow-up  DOCUMENTATION CODES:   Morbid obesity  INTERVENTION:   Continue TF via post-pyloric Cortrak tube:   Vital AF 1.2 at 40 ml/h   Pro-stat 60 ml BID   MVI daily   Provides 1552 kcal, 132 gm protein, 779 ml free water daily  NUTRITION DIAGNOSIS:   Inadequate oral intake related to inability to eat as evidenced by NPO status.  Ongoing   GOAL:   Provide needs based on ASPEN/SCCM guidelines  Met with TF  MONITOR:   Vent status, Labs, Skin, TF tolerance  ASSESSMENT:   68 yo female admitted with severe respiratory failure r/t COVID-19 PNA. PMH includes DM-2, obesity, asthma, CKD stage 3, chronic A fib, stroke, DVT.  Patient remains intubated on ventilator support, no significant improvements over the past week. Now being treated for MRSA bacteremia.  MV: 11.3 L/min Temp (24hrs), Avg:98.2 F (36.8 C), Min:97.7 F (36.5 C), Max:98.8 F (37.1 C)    Cortrak placed 7/29, tip is post-pyloric. Receiving Vital AF 1.2 at 40 ml/h with Pro-stat 30 ml QID. Tolerating well. Free water flushes 300 ml every 6 hours.   Labs reviewed.  CBG's: [phone]  Medications reviewed and include ferrous sulfate, novolog, levemir, MVI, miralax, vitamin C, zinc sulfate.  Diet Order:   Diet Order    None      EDUCATION NEEDS:   Not appropriate for education at this time  Skin:  Skin Assessment: Skin Integrity Issues: Skin Integrity Issues:: Stage II Stage II: sacrum  Last BM:  8/4 type 6  Height:   Ht Readings from Last 1 Encounters:  12/01/18 '5\' 3"'$  (1.6 m)    Weight:   Wt Readings from Last 1 Encounters:  12/01/18 118.3 kg    Ideal Body Weight:  52.3 kg  BMI:  Body mass index is 46.2 kg/m.  Estimated Nutritional Needs:   Kcal:  5910-2890  Protein:  110-130 gm  Fluid:  >/= 1.6 L    Molli Barrows, RD, LDN, [place] Pager [phone] After Hours Pager [phone]

## 2018-12-02 NOTE — Progress Notes (Signed)
ANTICOAGULATION CONSULT NOTE - Follow Up Consult  Pharmacy Consult for Heparin Indication: atrial fibrillation  (Hx DVT on PTA Xarelto)  Allergies  Allergen Reactions  . Sulfonamide Derivatives Itching    Patient Measurements: Height: 5\' 3"  (160 cm) Weight: 260 lb 12.9 oz (118.3 kg) IBW/kg (Calculated) : 52.4 Heparin Dosing Weight: 80.5 kg  Vital Signs: Temp: 98.1 F (36.7 C) (08/05 0400) Temp Source: Oral (08/05 0400) BP: 108/48 (08/05 0500) Pulse Rate: 81 (08/05 0500)  Labs: Recent Labs    11/29/18 2230  11/30/18 0227 11/30/18 0457 12/01/18 0535 12/01/18 1824 12/02/18 0510  HGB  --    < > 10.9* 10.1* 9.6*  --   --   HCT  --   --  32.0* 32.7* 31.9*  --   --   PLT  --   --   --  94* 81*  --   --   HEPARINUNFRC  --    < >  --   --  0.77* 0.52 0.39  CREATININE 1.30*  --   --  1.43* 1.34*  --   --    < > = values in this interval not displayed.    Estimated Creatinine Clearance: 50 mL/min (A) (by C-G formula based on SCr of 1.34 mg/dL (H)).   Assessment: 37 yoF admitted on 7/22 with PMH of afib and DVT on chronic Xarelto.  She was transitioned to Heparin IV for possible chest tube (not needed), and resume Xarelto on 7/29.  She then had black stools on 7/30 and Xarelto was d/c (last dose on 7/29 at 1400).  Pharmacy is now consulted to resume Heparin IV.  Currently on IV heparin at 750 units/hr. Heparin level remains therapeutic (0.39). H/H low stable, Plt down to 81k.   Goal of Therapy:  Heparin level 0.3-0.7 units/ml Monitor platelets by anticoagulation protocol: Yes   Plan:  Continue heparin IV infusion at 750 units/hr Daily heparin level and CBC Monitor platelet trend closely   Elenor Quinones, PharmD, BCPS, BCIDP Clinical Pharmacist 12/02/2018 6:04 AM

## 2018-12-02 NOTE — Progress Notes (Signed)
26 mL of Versed wasted in the stericycle with Twanna Hy RN

## 2018-12-02 NOTE — Progress Notes (Signed)
Pt's daughter called to unit. Updated of pt condition. Pt's daughter wanted a call from MD for update. Dr. Sherral Hammers notified

## 2018-12-02 NOTE — Progress Notes (Addendum)
PROGRESS NOTE    [name] [name]  HYQ:[id number] DOB: 1951/04/10 DOA: 11/07/2018 PCP: Nicholos Johns, MD   Brief Narrative:  68 y.o.BF PMHx morbid obesity, DM type II, COPD not on oxygen, HTN, chronic atrial fibrillation CHAD2VASc~4, on Xarelto and Cardizem,spine surgeries in the past,chronic fluid retention, history of Graves' disease now hypothyroid, history of CVA, history of DVT who lives at [facility] and was likely exposed to COVID-19 infection about 10 to 14 days prior to this admission.     She was experiencing fever, body aches and developed shortness of breath.  She finally decided to come into the emergency department at [facility].  She was found to be positive for COVID-19.  Was noted to have hypoxia.  She was hospitalized for further management.    Subjective: A/O x0, patient intubated.  RN reported hematuria overnight.  Discontinued heparin   Assessment & Plan:   Active Problems:   Acute respiratory failure with hypoxemia (HCC)   Pneumonia due to COVID-19 virus   SOB (shortness of breath)   Ventricular tachyarrhythmia (HCC)   Intubation of airway performed without difficulty   Subcutaneous emphysema (HCC)   Pneumomediastinum (HCC)   Pressure injury of skin  Acute respiratory failure with hypoxia/COVID 19 pneumonia/ARDS -Completed course Remdesivir -Dexamethasone started 7/22 6 mg daily tapered and stopped at 14 days. -Actemra x2 doses 7/22 and 7/23 -Continue vitamins per COVID protocol -Afebrile overnight - Continues on ventilator will attempt weaning trial today  Asthma vs COPD - See acute respiratory failure  Subcutaneous emphysema/pneumomediastinum - 7/26 diagnosis subcutaneous emphysema.  CXR also suggested mediastinal emphysema.  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  Stable   MRSA bacteremia -Central/PIC line removed -Patient placed on appropriate antibiotics -Patient will receive 24 to 48-hour line holiday - Blood culture NGTD see below - 7/24 echocardiogram negative vegetation.  However patient diagnosed with MRSA bacteremia on 8/2 repeat echocardiogram most likely will be required.  Would prefer TEE.  Will need to discuss with cardiology  Chronic A. fib with episodes of V. tach - At home was treated with Xarelto - Currently rate controlled - All anticoagulants held currently.  Discussed case with RN and patient is continuing to bleed, pure wick looked like a tampon when it was changed out.  Will reassess starting anticoagulant in next 24 to 48 hours. - Patient also thrombocytopenic if she continues to bleed will transfuse unit of platelets.  -Platelets low normal prior to admission -CHADS2VASc score~3  Oral cavity bleeding/melena - See A. fib  Bleeding from oral cavity Melena Patient is on Xarelto and was on heparin.  Resuming heparin.  No further bleeding noted. Hemoglobin remained stable.  Monitor.  Acute thrombocytopenia. Likely in the setting of MRSA bacteremia and sepsis. Timing is not adequate for heparin-induced thrombocytopenia. We will need to monitor it further.  Poor circulation. -Bilateral lower extremity cool to touch although pulses are present but faint. Check ABI.  Anasarca. -Appears to be third spacing with fluid. -Strict in and out -Daily weight -albumin and Lasix.  PRN  Acute on CKD stage III ?  (Cr 12/25/2016 0.90) -Unknown baseline renal function -Baseline renal function not entirely known.  Recent Labs  Lab 11/29/18 0435 11/29/18 2230 11/30/18 0457 12/01/18 0535 12/02/18 0510  CREATININE 1.22* 1.30* 1.43* 1.34* 1.13*   -Improved  Hypothyroidism -Continue levothyroxine.   -  As noted above her TSH was noted to be low.  Free T4 is 1.0.  No further work-up.  Low TSH likely sick  euthyroid.  Hx stroke  -Patient was on Xarelto and aspirin, have been switched over to IV heparin however given her ongoing bleeding all anticoagulants on hold.   History of vitamin D deficiency Continue supplementation.  Hx DVT -Xarelto on hold secondary to bleeding    Diabetes type 2 controlled with hyperglycemia -7/22 hemoglobin A1c = 7.0  - Levemir 50 units twice daily - NovoLog 10 units Q 4 hours - Resistant SSI  Morbid obesity Body mass index is 46.2 kg/m.   Nutrition Continue tube feedings.  Constipation resolved Continue bowel regimen.  Mildly elevated AST. Monitor for now.  Acute blood loss anemia -Hemoglobin on admission was 12.6. Recent Labs  Lab 11/29/18 0435 11/30/18 0227 11/30/18 0457 12/01/18 0535 12/02/18 0510  HGB 9.3* 10.9* 10.1* 9.6* 8.4*  - Transfuse for hemoglobin<7 - Hopefully patient's hemoglobin loss will stabilize now that all anticoagulant discontinued  Leukocytosis - Most likely secondary to infection - We will type and cross    DVT prophylaxis: Heparin (hold) Code Status: Partial Family Communication: Called both numbers in the chart under patient's information left messages with daughters that I had called and attempted to update them on patient's plan of care. Disposition Plan: TBD   Consultants:      Procedures/Significant Events:  Intubation 11/19/2018 >Central line insertion 11/19/2018 >Cortrack replacement 11/25/2018 >Transthoracic echocardiogram 7/24 1. The left ventricle has hyperdynamic systolic function, with an ejection fraction of >65%. The cavity size was normal. Left ventricular diastolic Doppler parameters are consistent with impaired relaxation. 2. The right ventricle has normal systolc function. The cavity was normal. There is no increase in right ventricular wall thickness. Right ventricular systolic pressure is normal. 3. Mild thickening of the aortic valve. No stenosis of the aortic valve.  4. The aortic root and ascending aorta are normal in size and structure. > Limited echocardiogram 12/01/2018 no vegetation.   I have personally reviewed and interpreted all radiology studies and my findings are as above.  VENTILATOR SETTINGS:    Cultures 8/4 blood RIGHT handx2 NGTD      Antimicrobials: Anti-infectives (From admission, onward)   Start     Stop   12/02/18 1400  vancomycin (VANCOCIN) 1,750 mg in sodium chloride 0.9 % 500 mL IVPB  Status:  Discontinued     12/02/18 1151   12/02/18 1300  vancomycin (VANCOCIN) IVPB 1000 mg/200 mL premix         11/30/18 1430  vancomycin (VANCOCIN) 2,500 mg in sodium chloride 0.9 % 500 mL IVPB     11/30/18 1711   11/19/18 2030  remdesivir 100 mg in sodium chloride 0.9 % 250 mL IVPB     11/22/18 2204   10/28/2018 2030  remdesivir 200 mg in sodium chloride 0.9 % 250 mL IVPB     11/02/2018 2056       Devices    LINES / TUBES:      Continuous Infusions: . sodium chloride Stopped (12/01/18 1300)  . amiodarone 30 mg/hr (12/02/18 0200)  . diltiazem (CARDIZEM) infusion Stopped (11/30/18 0604)  . fentaNYL infusion INTRAVENOUS 100 mcg/hr (12/02/18 0200)  . heparin 750 Units/hr (12/02/18 0200)  . midazolam 2 mg/hr (12/02/18 0200)  . phenylephrine (NEO-SYNEPHRINE) Adult infusion Stopped (11/30/18 2135)  . vancomycin       Objective: Vitals:   12/02/18 0400 12/02/18 0500 12/02/18 0600 12/02/18 0744  BP:  108/82 (!) 108/48 (!) 123/56 (!) 105/48  Pulse: 80 81 82 78  Resp: (!) 30 17 (!) 27 (!) 21  Temp: 98.1 F (36.7 C)     TempSrc: Oral     SpO2: 92% 91% (!) 88% 93%  Weight:      Height:        Intake/Output Summary (Last 24 hours) at 12/02/2018 0750 Last data filed at 12/02/2018 0400 Gross per 24 hour  Intake 1623.85 ml  Output 2550 ml  Net -926.15 ml   Filed Weights   11/29/18 0500 11/30/18 0500 12/01/18 0500  Weight: 115.7 kg 119.6 kg 118.3 kg    Examination:  General: A/O x0, intubated, positive acute  respiratory distress Eyes: negative scleral hemorrhage, negative anisocoria, negative icterus ENT: Negative Runny nose, negative gingival bleeding,  Neck:  Negative scars, masses, torticollis, lymphadenopathy, JVD,#7.5 cuffed trach in place negative bleeding, negative sign of infection  Lungs: Clear to auscultation bilaterally without wheezes or crackles Cardiovascular: Irregular irregular rhythm and rate without murmur gallop or rub normal S1 and S2 Abdomen: Obese, negative abdominal pain, nondistended, positive soft, bowel sounds, no rebound, no ascites, no appreciable mass Extremities: No significant cyanosis, clubbing, or edema bilateral lower extremities Skin: Negative rashes, lesions, ulcers Psychiatric: Unable to assess secondary to intubation and sedation Central nervous system: Unable to assess secondary to intubation did not respond to painful stimuli   .     Data Reviewed: Care during the described time interval was provided by me .  I have reviewed this patient's available data, including medical history, events of note, physical examination, and all test results as part of my evaluation.   CBC: Recent Labs  Lab 11/28/18 0430 11/29/18 0435 11/30/18 0227 11/30/18 0457 12/01/18 0535 12/02/18 0510  WBC 23.6* 14.5*  --  13.6* 11.3* 6.7  NEUTROABS 19.9* 11.9*  --  12.5* 9.9* 5.9  HGB 9.6* 9.3* 10.9* 10.1* 9.6* 8.4*  HCT 31.8* 31.3* 32.0* 32.7* 31.9* 27.6*  MCV 90.3 90.2  --  91.1 93.0 95.2  PLT 122* PLATELET CLUMPS NOTED ON SMEAR, UNABLE TO ESTIMATE  --  94* 81* 65*   Basic Metabolic Panel: Recent Labs  Lab 11/28/18 0430  11/29/18 0435 11/29/18 2230 11/30/18 0227 11/30/18 0457 12/01/18 0535 12/02/18 0510  NA 148*   < > 145 146* 142 145 142 143  K 4.4   < > 3.9 4.5 4.5 4.3 4.2 4.2  CL 106   < > 102 100  --  98 102 101  CO2 34*   < > 34* 32  --  32 33* 35*  GLUCOSE 288*   < > 204* 214*  --  232* 191* 181*  BUN 114*   < > 89* 91*  --  98* 99* 85*  CREATININE  1.44*   < > 1.22* 1.30*  --  1.43* 1.34* 1.13*  CALCIUM 7.6*   < > 7.6* 7.7*  --  7.5* 7.2* 7.4*  MG 2.7*  --  2.7*  --   --  2.6* 2.5* 2.5*  PHOS  --   --   --  3.2  --   --   --   --    < > = values in this interval not displayed.   GFR: Estimated Creatinine Clearance: 59.3 mL/min (A) (by C-G formula based on SCr of 1.13 mg/dL (H)). Liver Function Tests: Recent Labs  Lab 11/28/18 0430 11/29/18 0435 11/30/18 0457 12/01/18 0535 12/02/18 0510  AST 57* 57* 63* 88* 85*  ALT 37 33 39 70* 97*  ALKPHOS 77 72 75 82 61  BILITOT 0.7 0.8 1.1 1.1 1.3*  PROT 4.9* 5.0* 4.9* 4.7* 4.7*  ALBUMIN 2.3* 2.3* 2.4* 2.3* 2.6*   No results for input(s): LIPASE, AMYLASE in the last 168 hours. No results for input(s): AMMONIA in the last 168 hours. Coagulation Profile: No results for input(s): INR, PROTIME in the last 168 hours. Cardiac Enzymes: No results for input(s): CKTOTAL, CKMB, CKMBINDEX, TROPONINI in the last 168 hours. BNP (last 3 results) No results for input(s): PROBNP in the last 8760 hours. HbA1C: No results for input(s): HGBA1C in the last 72 hours. CBG: Recent Labs  Lab 12/01/18 1217 12/01/18 1544 12/01/18 1946 12/01/18 2320 12/02/18 0347  GLUCAP 182* 179* 198* 201* 179*   Lipid Profile: No results for input(s): CHOL, HDL, LDLCALC, TRIG, CHOLHDL, LDLDIRECT in the last 72 hours. Thyroid Function Tests: No results for input(s): TSH, T4TOTAL, FREET4, T3FREE, THYROIDAB in the last 72 hours. Anemia Panel: Recent Labs    11/30/18 0457 12/01/18 0535  FERRITIN 214 347*   Urine analysis:    Component Value Date/Time   COLORURINE YELLOW 11/30/2018 0400   APPEARANCEUR HAZY (A) 11/30/2018 0400   LABSPEC <1.005 (L) 11/30/2018 0400   PHURINE >9.0 (H) 11/30/2018 0400   GLUCOSEU NEGATIVE 11/30/2018 0400   HGBUR SMALL (A) 11/30/2018 0400   BILIRUBINUR NEGATIVE 11/30/2018 0400   KETONESUR NEGATIVE 11/30/2018 0400   PROTEINUR TRACE (A) 11/30/2018 0400   UROBILINOGEN 1.0  06/20/2014 0442   NITRITE NEGATIVE 11/30/2018 0400   LEUKOCYTESUR SMALL (A) 11/30/2018 0400   Sepsis Labs: @LABRCNTIP (procalcitonin:4,lacticidven:4)  ) Recent Results (from the past 240 hour(s))  Culture, blood (routine x 2)     Status: Abnormal (Preliminary result)   Collection Time: 11/29/18 10:55 PM   Specimen: BLOOD  Result Value Ref Range Status   Specimen Description BLOOD RIGHT ANTECUBITAL  Final   Special Requests   Final    BOTTLES DRAWN AEROBIC AND ANAEROBIC Blood Culture adequate volume   Culture  Setup Time   Final    GRAM POSITIVE COCCI IN CLUSTERS IN BOTH AEROBIC AND ANAEROBIC BOTTLES CRITICAL RESULT CALLED TO, READ BACK BY AND VERIFIED WITH: PHARMD CAREN AMEND [id number] FCP    Culture (A)  Final    STAPHYLOCOCCUS AUREUS SUSCEPTIBILITIES TO FOLLOW Performed at [facility], [place] [address], [place], [place] [address]    Report Status PENDING  Incomplete  Blood Culture ID Panel (Reflexed)     Status: Abnormal   Collection Time: 11/29/18 10:55 PM  Result Value Ref Range Status   Enterococcus species NOT DETECTED NOT DETECTED Final   Listeria monocytogenes NOT DETECTED NOT DETECTED Final   Staphylococcus species DETECTED (A) NOT DETECTED Final    Comment: CRITICAL RESULT CALLED TO, READ BACK BY AND VERIFIED WITH: PHARMD CAREN AMEND [id number] [id number] FCP    Staphylococcus aureus (BCID) DETECTED (A) NOT DETECTED Final    Comment: Methicillin (oxacillin)-resistant Staphylococcus aureus (MRSA). MRSA is predictably resistant to beta-lactam antibiotics (except ceftaroline). Preferred therapy is vancomycin unless clinically contraindicated. Patient requires contact precautions if  hospitalized. CRITICAL RESULT CALLED TO, READ BACK BY AND VERIFIED WITH: PHARMD CAREN AMEND [id number] FCP    Methicillin resistance DETECTED (A) NOT DETECTED Final    Comment: CRITICAL RESULT CALLED TO, READ BACK BY AND VERIFIED WITH: PHARMD CAREN AMEND [id number] FCP    Streptococcus  species NOT DETECTED NOT DETECTED Final   Streptococcus agalactiae NOT DETECTED NOT DETECTED  Final   Streptococcus pneumoniae NOT DETECTED NOT DETECTED Final   Streptococcus pyogenes NOT DETECTED NOT DETECTED Final   Acinetobacter baumannii NOT DETECTED NOT DETECTED Final   Enterobacteriaceae species NOT DETECTED NOT DETECTED Final   Enterobacter cloacae complex NOT DETECTED NOT DETECTED Final   Escherichia coli NOT DETECTED NOT DETECTED Final   Klebsiella oxytoca NOT DETECTED NOT DETECTED Final   Klebsiella pneumoniae NOT DETECTED NOT DETECTED Final   Proteus species NOT DETECTED NOT DETECTED Final   Serratia marcescens NOT DETECTED NOT DETECTED Final   Haemophilus influenzae NOT DETECTED NOT DETECTED Final   Neisseria meningitidis NOT DETECTED NOT DETECTED Final   Pseudomonas aeruginosa NOT DETECTED NOT DETECTED Final   Candida albicans NOT DETECTED NOT DETECTED Final   Candida glabrata NOT DETECTED NOT DETECTED Final   Candida krusei NOT DETECTED NOT DETECTED Final   Candida parapsilosis NOT DETECTED NOT DETECTED Final   Candida tropicalis NOT DETECTED NOT DETECTED Final    Comment: Performed at [facility], [place] [address] [place], [place] [address]  Culture, blood (routine x 2)     Status: None (Preliminary result)   Collection Time: 11/29/18 11:05 PM   Specimen: BLOOD RIGHT HAND  Result Value Ref Range Status   Specimen Description BLOOD RIGHT HAND  Final   Special Requests   Final    BOTTLES DRAWN AEROBIC AND ANAEROBIC Blood Culture adequate volume   Culture  Setup Time   Final    GRAM POSITIVE COCCI IN BOTH AEROBIC AND ANAEROBIC BOTTLES CRITICAL VALUE NOTED.  VALUE IS CONSISTENT WITH PREVIOUSLY REPORTED AND CALLED VALUE. Performed at [facility], [place] [address], [place], [place] [address]    Culture [facility] POSITIVE COCCI  Final   Report Status PENDING  Incomplete         Radiology Studies: [place] Ekg Site Rite  Result Date: 11/30/2018 If Site Rite image not  attached, placement could not be confirmed due to current cardiac rhythm.       Scheduled Meds: . chlorhexidine  15 mL Mouth/Throat BID  . clonazePAM  1 mg Oral BID  . digoxin  0.125 mg Intravenous Daily  . feeding supplement (PRO-STAT SUGAR FREE 64)  30 mL Per Tube QID  . feeding supplement (VITAL AF 1.2 CAL)  1,000 mL Per Tube Q24H  . fentaNYL (SUBLIMAZE) injection  50 mcg Intravenous Once  . ferrous sulfate  300 mg Per Tube TID WC  . free water  300 mL Per Tube Q6H  . gabapentin  100 mg Per Tube Q8H  . insulin aspart  0-20 Units Subcutaneous Q4H  . insulin aspart  10 Units Subcutaneous Q4H  . insulin detemir  50 Units Subcutaneous BID  . levothyroxine  112 mcg Oral Q0600  . mouth rinse  15 mL Mouth Rinse 10 times per day  . metoprolol tartrate  2.5 mg Intravenous Q6H  . multivitamin with minerals  1 tablet Per Tube Daily  . oxyCODONE  5 mg Per Tube Q6H  . pantoprazole sodium  40 mg Per Tube BID  . polyethylene glycol  17 g Per Tube Daily  . sodium chloride flush  10-40 mL Intracatheter Q12H  . vitamin C  500 mg Per Tube Daily  . zinc sulfate  220 mg Per Tube Daily   Continuous Infusions: . sodium chloride Stopped (12/01/18 1300)  . amiodarone 30 mg/hr (12/02/18 0200)  . diltiazem (CARDIZEM) infusion Stopped (11/30/18 0604)  . fentaNYL infusion INTRAVENOUS 100 mcg/hr (12/02/18 0200)  .  heparin 750 Units/hr (12/02/18 0200)  . midazolam 2 mg/hr (12/02/18 0200)  . phenylephrine (NEO-SYNEPHRINE) Adult infusion Stopped (11/30/18 2135)  . vancomycin       LOS: 14 days   The patient is critically ill with multiple organ systems failure and requires high complexity decision making for assessment and support, frequent evaluation and titration of therapies, application of advanced monitoring technologies and extensive interpretation of multiple databases. Critical Care Time devoted to patient care services described in this note  Time spent: 40 minutes     WOODS, Geraldo Docker,  MD [facility] Pager [phone]  If 7PM-7AM, please contact night-coverage [email or web address] Password [email or web address] 12/02/2018, 7:50 AM

## 2018-12-03 ENCOUNTER — Inpatient Hospital Stay (HOSPITAL_COMMUNITY): Payer: Medicare Other

## 2018-12-03 DIAGNOSIS — R601 Generalized edema: Secondary | ICD-10-CM

## 2018-12-03 DIAGNOSIS — N178 Other acute kidney failure: Secondary | ICD-10-CM

## 2018-12-03 DIAGNOSIS — B9562 Methicillin resistant Staphylococcus aureus infection as the cause of diseases classified elsewhere: Secondary | ICD-10-CM | POA: Diagnosis present

## 2018-12-03 DIAGNOSIS — I10 Essential (primary) hypertension: Secondary | ICD-10-CM

## 2018-12-03 DIAGNOSIS — D62 Acute posthemorrhagic anemia: Secondary | ICD-10-CM | POA: Diagnosis present

## 2018-12-03 DIAGNOSIS — N179 Acute kidney failure, unspecified: Secondary | ICD-10-CM | POA: Diagnosis present

## 2018-12-03 DIAGNOSIS — R7881 Bacteremia: Secondary | ICD-10-CM | POA: Diagnosis present

## 2018-12-03 DIAGNOSIS — D696 Thrombocytopenia, unspecified: Secondary | ICD-10-CM | POA: Diagnosis present

## 2018-12-03 DIAGNOSIS — E1165 Type 2 diabetes mellitus with hyperglycemia: Secondary | ICD-10-CM | POA: Diagnosis present

## 2018-12-03 DIAGNOSIS — I480 Paroxysmal atrial fibrillation: Secondary | ICD-10-CM

## 2018-12-03 DIAGNOSIS — I482 Chronic atrial fibrillation, unspecified: Secondary | ICD-10-CM | POA: Diagnosis present

## 2018-12-03 LAB — HEPARIN LEVEL (UNFRACTIONATED): Heparin Unfractionated: <0.1 IU/mL — ABNORMAL LOW (ref 0.30–0.70)

## 2018-12-03 LAB — CBC WITH DIFFERENTIAL/PLATELET
Abs Immature Granulocytes: 0.02 10*3/uL (ref 0.00–0.07)
Basophils Absolute: 0 10*3/uL (ref 0.0–0.1)
Basophils Relative: 0 %
Eosinophils Absolute: 0.1 10*3/uL (ref 0.0–0.5)
Eosinophils Relative: 2 %
HCT: 28.2 % — ABNORMAL LOW (ref 36.0–46.0)
Hemoglobin: 8.3 g/dL — ABNORMAL LOW (ref 12.0–15.0)
Immature Granulocytes: 1 %
Lymphocytes Relative: 15 %
Lymphs Abs: 0.6 10*3/uL — ABNORMAL LOW (ref 0.7–4.0)
MCH: 28.5 pg (ref 26.0–34.0)
MCHC: 29.4 g/dL — ABNORMAL LOW (ref 30.0–36.0)
MCV: 96.9 fL (ref 80.0–100.0)
Monocytes Absolute: 0.1 10*3/uL (ref 0.1–1.0)
Monocytes Relative: 3 %
Neutro Abs: 3.2 10*3/uL (ref 1.7–7.7)
Neutrophils Relative %: 79 %
Platelets: 58 10*3/uL — ABNORMAL LOW (ref 150–400)
RBC: 2.91 MIL/uL — ABNORMAL LOW (ref 3.87–5.11)
RDW: 20.4 % — ABNORMAL HIGH (ref 11.5–15.5)
WBC: 4 10*3/uL (ref 4.0–10.5)
nRBC: 20.8 % — ABNORMAL HIGH (ref 0.0–0.2)

## 2018-12-03 LAB — COMPREHENSIVE METABOLIC PANEL
ALT: 100 U/L — ABNORMAL HIGH (ref 0–44)
AST: 64 U/L — ABNORMAL HIGH (ref 15–41)
Albumin: 2.4 g/dL — ABNORMAL LOW (ref 3.5–5.0)
Alkaline Phosphatase: 58 U/L (ref 38–126)
Anion gap: 8 (ref 5–15)
BUN: 70 mg/dL — ABNORMAL HIGH (ref 8–23)
CO2: 33 mmol/L — ABNORMAL HIGH (ref 22–32)
Calcium: 7.5 mg/dL — ABNORMAL LOW (ref 8.9–10.3)
Chloride: 102 mmol/L (ref 98–111)
Creatinine, Ser: 0.85 mg/dL (ref 0.44–1.00)
GFR calc Af Amer: >60 mL/min (ref >60)
GFR calc non Af Amer: >60 mL/min (ref >60)
Glucose, Bld: 177 mg/dL — ABNORMAL HIGH (ref 70–99)
Potassium: 4.4 mmol/L (ref 3.5–5.1)
Sodium: 143 mmol/L (ref 135–145)
Total Bilirubin: 1.1 mg/dL (ref 0.3–1.2)
Total Protein: 4.7 g/dL — ABNORMAL LOW (ref 6.5–8.1)

## 2018-12-03 LAB — GLUCOSE, CAPILLARY
Glucose-Capillary: 100 mg/dL — ABNORMAL HIGH (ref 70–99)
Glucose-Capillary: 125 mg/dL — ABNORMAL HIGH (ref 70–99)
Glucose-Capillary: 128 mg/dL — ABNORMAL HIGH (ref 70–99)
Glucose-Capillary: 143 mg/dL — ABNORMAL HIGH (ref 70–99)
Glucose-Capillary: 145 mg/dL — ABNORMAL HIGH (ref 70–99)
Glucose-Capillary: 153 mg/dL — ABNORMAL HIGH (ref 70–99)
Glucose-Capillary: 170 mg/dL — ABNORMAL HIGH (ref 70–99)

## 2018-12-03 LAB — ABO/RH: ABO/RH(D): A POS

## 2018-12-03 LAB — C-REACTIVE PROTEIN: CRP: 1.1 mg/dL — ABNORMAL HIGH (ref <1.0)

## 2018-12-03 LAB — D-DIMER, QUANTITATIVE: D-Dimer, Quant: 6.55 ug/mL-FEU — ABNORMAL HIGH (ref 0.00–0.50)

## 2018-12-03 LAB — MAGNESIUM: Magnesium: 2.5 mg/dL — ABNORMAL HIGH (ref 1.7–2.4)

## 2018-12-03 LAB — DIGOXIN LEVEL: Digoxin Level: 1 ng/mL (ref 0.8–2.0)

## 2018-12-03 MED ORDER — VANCOMYCIN HCL IN DEXTROSE 1-5 GM/200ML-% IV SOLN
1000.0000 mg | Freq: Two times a day (BID) | INTRAVENOUS | Status: DC
  Administered 2018-12-04 (×3): 1000 mg via INTRAVENOUS
  Filled 2018-12-03 (×6): qty 200

## 2018-12-03 MED ORDER — CLONAZEPAM 0.5 MG PO TABS
0.5000 mg | ORAL_TABLET | Freq: Two times a day (BID) | ORAL | Status: DC
  Administered 2018-12-03: 21:00:00 0.5 mg via ORAL
  Filled 2018-12-03: qty 1

## 2018-12-03 MED ORDER — OXYCODONE HCL 5 MG/5ML PO SOLN
5.0000 mg | Freq: Two times a day (BID) | ORAL | Status: DC
  Administered 2018-12-03 – 2018-12-05 (×4): 5 mg
  Filled 2018-12-03 (×4): qty 5

## 2018-12-03 NOTE — Progress Notes (Signed)
NAME:  [name] [name], MRN:  [id number], DOB:  1950-11-03, LOS: 12 ADMISSION DATE:  11/17/2018, CONSULTATION DATE:  July 23 REFERRING MD:  Candiss Norse, CHIEF COMPLAINT:  dyspnea   Brief History   68 year old female admitted for severe acute respiratory failure with hypoxemia due to COVID-19 pneumonia  Past Medical History  Diabetes mellitus type 2 Obesity Asthma, has been on inhalers in the past, never smoked cigarettes CKD stage III Chronic A. fib History of spine surgery History of Graves' disease, now hypothyroid History of stroke History of DVT Lives in an assisted living facility [facility] Events   July 22 admitted July 23 intubated July 24 prone positioning July 26 subcutaneous air, no pneumothorax, afib with RVR again July 29 decreasing sedation, diuresis July 30 decreasing sedation July 31 holding additional diuresis 8/2 weaning on pressure support 8/5 vent settings down to FiO2 45% and PEEP 5  Consults:  PCCM  Procedures:  7/23 ETT > 7/23 L IJ CVL >8/3 8/3 midline/PIV>   Significant Diagnostic Tests:  7/24 Echo > LVEF > 65%, impaired relaxation, RV normal systolic function, mild thickening of aortic valve 8/4 repeat Echo> LVEF normal > 65%, no clear endocarditis  Micro Data:  July 22 SARS COV 2 Positive 8/2 blood > MRSA 8/4 blood >  8/5 blood > pending  Antimicrobials:  July 22 remdesivir> 7/26 July 22 decadron July 22> 23 Toclizumab   Interim history/subjective:   Vent settings much improved Off sedation but drowsy, not following commands  Objective   Blood pressure (!) 94/47, pulse 77, temperature 98.7 F (37.1 C), temperature source Oral, resp. rate (!) 21, height 5\' 3"  (1.6 m), weight 118.3 kg, SpO2 (!) 89 %.    Vent Mode: PCV FiO2 (%):  [40 %-50 %] 45 % Set Rate:  [24 bmp] 24 bmp PEEP:  [5 cmH20] 5 cmH20 Plateau Pressure:  [18 cmH20] 18 cmH20   Intake/Output Summary (Last 24 hours) at 12/03/2018 0757 Last data filed at  12/03/2018 0700 Gross per 24 hour  Intake 4317.71 ml  Output 1570 ml  Net 2747.71 ml   Filed Weights   11/29/18 0500 11/30/18 0500 12/01/18 0500  Weight: 115.7 kg 119.6 kg 118.3 kg    Examination:  General:  In bed on vent HENT: NCAT ETT in place PULM: CTA B, vent supported breathing CV: RRR, no mgr GI: BS+, soft, nontender MSK: normal bulk and tone Neuro:drowsy, eyes open, doesn't follow commands   8/3 CXR images independently reviewed: bibasilar infiltrates, no clear pneumomediastinum  ABG    Component Value Date/Time   PHART 7.480 (H) 11/30/2018 0227   PCO2ART 46.4 11/30/2018 0227   PO2ART 57.0 (L) 11/30/2018 0227   HCO3 34.4 (H) 11/30/2018 0227   TCO2 36 (H) 11/30/2018 0227   ACIDBASEDEF 3.0 (H) 11/21/2018 0628   O2SAT 90.0 11/30/2018 0227     Resolved Hospital Problem list    Assessment & Plan:  ARDS due to COVID-19 pneumonia: has made signficant improvement this week Vent day 15 8/6 8/6 pressure support wean today, needs to be more awake prior to considering extubation When back on full support, Continue mechanical ventilation per ARDS protocol Target TVol 6-8cc/kgIBW Target Plateau Pressure < 30cm H20 Target driving pressure less than 15 cm of water Target PaO2 55-65: titrate PEEP/FiO2 per protocol As long as PaO2 to FiO2 ratio is less than 1:150 position in prone position for 16 hours a day Check CVP daily if CVL in place Target CVP less than 4,  diurese as necessary Ventilator associated pneumonia prevention protocol  Pneumomediastinum: stable on 8/3 CXR, no clinical evidence of progression Continue to monitor with prn CXR  MRSA bacteremia: F/u repeat blood cultures vanc  Ventricular tachycardia noted on July 23' Atrial fib with RVR > improved again on 8/4 Tele  Amiodarone infusion to continue, consider changing over again in 2-3 days (oral) Continue metoprolol and digoxin  Need for sedation for mechanical ventilation/dyssynchrony RASS target 0  to -1 Wean off sedation: decrease clonazepam, oxycodone, holding fentanyl infusion  Prognosis:guarded but making improvement  Best practice:  Diet: Tube feeding Pain/Anxiety/Delirium protocol (if indicated): as above VAP protocol (if indicated): yes DVT prophylaxis: heparin infusion GI prophylaxis: Pantoprazole for stress ulcer prophylaxis Glucose control: SSI Mobility: bed rest Code Status: limited code Family Communication: will discuss with TRH Disposition: remian in ICU  Labs   CBC: Recent Labs  Lab 11/29/18 0435 11/30/18 0227 11/30/18 0457 12/01/18 0535 12/02/18 0510 12/03/18 0500  WBC 14.5*  --  13.6* 11.3* 6.7 4.0  NEUTROABS 11.9*  --  12.5* 9.9* 5.9 3.2  HGB 9.3* 10.9* 10.1* 9.6* 8.4* 8.3*  HCT 31.3* 32.0* 32.7* 31.9* 27.6* 28.2*  MCV 90.2  --  91.1 93.0 95.2 96.9  PLT PLATELET CLUMPS NOTED ON SMEAR, UNABLE TO ESTIMATE  --  94* 81* 65* 58*    Basic Metabolic Panel: Recent Labs  Lab 11/29/18 0435 11/29/18 2230 11/30/18 0227 11/30/18 0457 12/01/18 0535 12/02/18 0510 12/03/18 0500  NA 145 146* 142 145 142 143 143  K 3.9 4.5 4.5 4.3 4.2 4.2 4.4  CL 102 100  --  98 102 101 102  CO2 34* 32  --  32 33* 35* 33*  GLUCOSE 204* 214*  --  232* 191* 181* 177*  BUN 89* 91*  --  98* 99* 85* 70*  CREATININE 1.22* 1.30*  --  1.43* 1.34* 1.13* 0.85  CALCIUM 7.6* 7.7*  --  7.5* 7.2* 7.4* 7.5*  MG 2.7*  --   --  2.6* 2.5* 2.5* 2.5*  PHOS  --  3.2  --   --   --   --   --    GFR: Estimated Creatinine Clearance: 78.8 mL/min (by C-G formula based on SCr of 0.85 mg/dL). Recent Labs  Lab 11/29/18 2255 11/30/18 0457 12/01/18 0535 12/02/18 0510 12/03/18 0500  PROCALCITON 0.26  --   --   --   --   WBC  --  13.6* 11.3* 6.7 4.0    Liver Function Tests: Recent Labs  Lab 11/29/18 0435 11/30/18 0457 12/01/18 0535 12/02/18 0510 12/03/18 0500  AST 57* 63* 88* 85* 64*  ALT 33 39 70* 97* 100*  ALKPHOS 72 75 82 61 58  BILITOT 0.8 1.1 1.1 1.3* 1.1  PROT 5.0* 4.9* 4.7*  4.7* 4.7*  ALBUMIN 2.3* 2.4* 2.3* 2.6* 2.4*   No results for input(s): LIPASE, AMYLASE in the last 168 hours. No results for input(s): AMMONIA in the last 168 hours.  ABG    Component Value Date/Time   PHART 7.480 (H) 11/30/2018 0227   PCO2ART 46.4 11/30/2018 0227   PO2ART 57.0 (L) 11/30/2018 0227   HCO3 34.4 (H) 11/30/2018 0227   TCO2 36 (H) 11/30/2018 0227   ACIDBASEDEF 3.0 (H) 11/21/2018 0628   O2SAT 90.0 11/30/2018 0227     Coagulation Profile: No results for input(s): INR, PROTIME in the last 168 hours.  Cardiac Enzymes: No results for input(s): CKTOTAL, CKMB, CKMBINDEX, TROPONINI in the last 168 hours.  HbA1C:  Hgb A1c MFr Bld  Date/Time Value Ref Range Status  11/05/2018 08:00 PM 7.0 (H) 4.8 - 5.6 % Final    Comment:    (NOTE) Pre diabetes:          5.7%-6.4% Diabetes:              >6.4% Glycemic control for   <7.0% adults with diabetes   10/18/2016 10:47 AM 6.0 (H) 4.8 - 5.6 % Final    Comment:    (NOTE)         Pre-diabetes: 5.7 - 6.4         Diabetes: >6.4         Glycemic control for adults with diabetes: <7.0     CBG: Recent Labs  Lab 12/02/18 1944 12/02/18 2145 12/02/18 2352 12/03/18 0337 12/03/18 0743  GLUCAP 164* 150* 143* 153* 128*    Critical care time: 35 minutes    Roselie Awkward, MD [place] [facility] Pager: [phone] Cell: [phone] If no response, call [phone]

## 2018-12-03 NOTE — Progress Notes (Addendum)
PROGRESS NOTE    [name] [name]  VQM:[id number] DOB: 1951-02-26 DOA: 11/04/2018 PCP: Nicholos Johns, MD   Brief Narrative:  68 y.o.BF PMHx morbid obesity, DM type II, COPD not on oxygen, HTN, chronic atrial fibrillation CHAD2VASc~4, on Xarelto and Cardizem,spine surgeries in the past,chronic fluid retention, history of Graves' disease now hypothyroid, history of CVA, history of DVT who lives at [facility] and was likely exposed to COVID-19 infection about 10 to 14 days prior to this admission.     She was experiencing fever, body aches and developed shortness of breath.  She finally decided to come into the emergency department at [facility].  She was found to be positive for COVID-19.  Was noted to have hypoxia.  She was hospitalized for further management.    Subjective: 8/6 A/O x0 (intubated and sedated).  Hematuria has resolved.  Reported possible stool in pure wick system during a change of the system overnight.   Assessment & Plan:   Active Problems:   Essential hypertension   Acute respiratory failure with hypoxemia (HCC)   PAF (paroxysmal atrial fibrillation) (HCC)   Pneumonia due to COVID-19 virus   SOB (shortness of breath)   Ventricular tachyarrhythmia (HCC)   Intubation of airway performed without difficulty   Subcutaneous emphysema (HCC)   Pneumomediastinum (HCC)   Pressure injury of skin   MRSA bacteremia   Atrial fibrillation, chronic   Anasarca   Acute renal failure superimposed on stage 3 chronic kidney disease (HCC)   Controlled type 2 diabetes mellitus with hyperglycemia (HCC)   Acute blood loss anemia   Thrombocytopenia (HCC)  Acute respiratory failure with hypoxia/COVID 19 pneumonia/ARDS -Completed course Remdesivir -Dexamethasone started 7/22 6 mg daily tapered and stopped at 14 days. -Actemra x2 doses 7/22 and 7/23 -Continue vitamins per COVID protocol -Afebrile overnight - 8/6 continues on ventilator will attempt weaning trial today,  after Versed titrated off - 8/6 PCXR shows worsening pneumonia see results below  Asthma vs COPD - See acute respiratory failure  Subcutaneous emphysema/pneumomediastinum - 7/26 diagnosis subcutaneous emphysema.  CXR also suggested mediastinal emphysema.  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  Stable   MRSA bacteremia -Central/PIC line removed -Patient placed on appropriate antibiotics -Patient will receive 24 to 48-hour line holiday - Blood culture NGTD see below - 7/24 echocardiogram negative vegetation.  However patient diagnosed with MRSA bacteremia on 8/2 repeat echocardiogram most likely will be required.  Would prefer TEE.  Will need to discuss with cardiology - 8/2 repeat blood cultures positive staph aureus speciation and susceptibilities pending.  Enterovesical Fistula ? -Some concern that patient may have a fistula, but I am not fully convinced patient has such fistula.  Usually patient would have a significant leukocytosis, fever etc.  But given that she has been on immunosuppressive medication we will proceed as if fistula possible. - Obtain urinalysis - Obtain urine culture - If urinalysis or urine culture show any type of fecal matter in them the next step would be to obtain abdomen/pelvis ultrasound followed by poppyseed test followed by abdominal/pelvic CT or MRI.  In addition would consult GI.  Chronic A. fib with episodes of V. tach - At home was treated with Xarelto - Currently rate controlled - 8/5 all anticoagulants held currently.  Discussed case with RN and patient is continuing to  bleed, pure wick looked like a tampon when it was changed out.  Will reassess starting anticoagulant in next 24 to 48 hours. - Patient also thrombocytopenic if she continues to  bleed will transfuse unit of platelets.  -Platelets low normal prior to admission -CHADS2VASc score~3 -8/6 bleeding has stabilized continue to hold all anticoagulants for another 24 hours will then make decision on whether to restart.   poor circulation. -Bilateral lower extremity cool to touch although pulses are present but faint. Check ABI.  Anasarca. -Appears to be third spacing with fluid. -Strict in and out +8.7 L -Daily weight Filed Weights   11/29/18 0500 11/30/18 0500 12/01/18 0500  Weight: 115.7 kg 119.6 kg 118.3 kg  -q4hr CVP -albumin and Lasix.  PRN  Acute on CKD stage III ?  (Cr 12/25/2016 0.90) -Unknown baseline renal function -Baseline renal function not entirely known.  Recent Labs  Lab 11/29/18 2230 11/30/18 0457 12/01/18 0535 12/02/18 0510 12/03/18 0500  CREATININE 1.30* 1.43* 1.34* 1.13* 0.85  -Improved  Hypothyroidism -Continue levothyroxine.   -As noted above her TSH was noted to be low.  Free T4 is 1.0.  No further work-up.  Low TSH likely sick euthyroid.  Hx stroke  -Patient was on Xarelto and aspirin, have been switched over to IV heparin however given her ongoing bleeding all anticoagulants on hold.   History of vitamin D deficiency Continue supplementation.  Hx DVT -Xarelto on hold secondary to bleeding    Diabetes type 2 controlled with hyperglycemia -7/22 hemoglobin A1c = 7.0  - Levemir 50 units twice daily - NovoLog 10 units Q 4 hours - Resistant SSI  Morbid obesity Body mass index is 46.2 kg/m.   Nutrition Continue tube feedings.  Constipation resolved Continue bowel regimen.  Mildly elevated AST. Monitor for now.  Acute blood loss anemia/Bleeding from oral cavity/melena -Patient had been on Xarelto and heparin all anticoagulants discontinued -Resolved  -Hemoglobin on admission was 12.6. Recent Labs  Lab 11/30/18 0227 11/30/18 0457 12/01/18 0535 12/02/18 0510 12/03/18 0500  HGB 10.9* 10.1* 9.6*  8.4* 8.3*  - Transfuse for hemoglobin<7 - Hopefully patient's hemoglobin loss will stabilize now that all anticoagulant discontinued  Acute thrombocytopenia - Most likely secondary to MRSA bacteremia -Currently hemoglobin stabilized off anticoagulants will obtain HIT panel -If bleeding resumes transfuse platelets   Leukocytosis - Most likely secondary to infection - We will type and cross    DVT prophylaxis: Heparin (hold) Code Status: Partial Family Communication: .  Spoke to Marciano Sequin daughter reviewed plan of care answered all questions. Disposition Plan: TBD   Consultants:  PCCM    Procedures/Significant Events:  Intubation 11/19/2018 >Central line insertion 11/19/2018 >Cortrack replacement 11/25/2018 >Transthoracic echocardiogram 7/24 1. The left ventricle has hyperdynamic systolic function, with an ejection fraction of >65%. The cavity size was normal. Left ventricular diastolic Doppler parameters are consistent with impaired relaxation. 2. The right ventricle has normal systolc function. The cavity was normal. There is no increase in right ventricular wall thickness. Right ventricular systolic pressure is normal. 3. Mild thickening of the aortic valve. No stenosis of the aortic valve. 4. The aortic root and ascending aorta are normal in size and structure. > Limited echocardiogram 12/01/2018 no vegetation. 8/6 PCXR: New LEFT supra clavicular soft tissue emphysema, no visible pneumothorax - Worsening pneumonia, chronically elevated RIGHT diaphragm   I have personally reviewed and interpreted all radiology studies and my findings are as above.  VENTILATOR SETTINGS:    Cultures 8/4 blood RIGHT handx2 positive staph  aureus      Antimicrobials: Anti-infectives (From admission, onward)   Start     Stop   12/02/18 1400  vancomycin (VANCOCIN) 1,750 mg in sodium chloride 0.9 % 500 mL IVPB  Status:  Discontinued     12/02/18 1151   12/02/18 1300  vancomycin  (VANCOCIN) IVPB 1000 mg/200 mL premix         11/30/18 1430  vancomycin (VANCOCIN) 2,500 mg in sodium chloride 0.9 % 500 mL IVPB     11/30/18 1711   11/19/18 2030  remdesivir 100 mg in sodium chloride 0.9 % 250 mL IVPB     11/22/18 2204   11/27/2018 2030  remdesivir 200 mg in sodium chloride 0.9 % 250 mL IVPB     11/20/2018 2056       Devices    LINES / TUBES:      Continuous Infusions:  sodium chloride Stopped (12/01/18 1300)   amiodarone 30 mg/hr (12/03/18 0800)   fentaNYL infusion INTRAVENOUS 90 mcg/hr (12/03/18 0800)   phenylephrine (NEO-SYNEPHRINE) Adult infusion 63 mcg/min (12/03/18 0800)   vancomycin 1,000 mg (12/03/18 1224)     Objective: Vitals:   12/03/18 1000 12/03/18 1030 12/03/18 1100 12/03/18 1157  BP: (!) 103/51 (!) 106/53 (!) 110/53 (!) 103/50  Pulse: 85 85 94 94  Resp: (!) 26 (!) 25 (!) 30 (!) 37  Temp:      TempSrc:      SpO2: 90% 91% (!) 83% 94%  Weight:      Height:        Intake/Output Summary (Last 24 hours) at 12/03/2018 1421 Last data filed at 12/03/2018 0800 Gross per 24 hour  Intake 3826.69 ml  Output 1220 ml  Net 2606.69 ml   Filed Weights   11/29/18 0500 11/30/18 0500 12/01/18 0500  Weight: 115.7 kg 119.6 kg 118.3 kg   Physical Exam:  General: A/O x0 (intubated/sedated) positive acute respiratory distress Eyes: negative scleral hemorrhage, negative anisocoria, negative icterus ENT: Negative Runny nose, negative gingival bleeding, #7.5 cuffed ETT tube in place negative sign of infection or bleeding Neck:  Negative scars, masses, torticollis, lymphadenopathy, JVD Lungs: Tachypneic, diffuse rhonchi, without wheezes or crackles Cardiovascular: Regular rate and rhythm without murmur gallop or rub normal S1 and S2 Abdomen: Obese, negative abdominal pain, nondistended, positive soft, bowel sounds, no rebound, no ascites, no appreciable mass Extremities: No significant cyanosis, clubbing, or edema bilateral lower extremities Skin:  Negative rashes, lesions, ulcers Psychiatric: Unable to assess secondary to intubation and sedation   Central nervous system: Does not respond to painful stimuli   .     Data Reviewed: Care during the described time interval was provided by me .  I have reviewed this patient's available data, including medical history, events of note, physical examination, and all test results as part of my evaluation.   CBC: Recent Labs  Lab 11/29/18 0435 11/30/18 0227 11/30/18 0457 12/01/18 0535 12/02/18 0510 12/03/18 0500  WBC 14.5*  --  13.6* 11.3* 6.7 4.0  NEUTROABS 11.9*  --  12.5* 9.9* 5.9 3.2  HGB 9.3* 10.9* 10.1* 9.6* 8.4* 8.3*  HCT 31.3* 32.0* 32.7* 31.9* 27.6* 28.2*  MCV 90.2  --  91.1 93.0 95.2 96.9  PLT PLATELET CLUMPS NOTED ON SMEAR, UNABLE TO ESTIMATE  --  94* 81* 65* 58*   Basic Metabolic Panel: Recent Labs  Lab 11/29/18 0435 11/29/18 2230 11/30/18 0227 11/30/18 0457 12/01/18 0535 12/02/18 0510 12/03/18 0500  NA 145 146* 142 145 142 143 143  K 3.9 4.5 4.5 4.3 4.2 4.2 4.4  CL 102 100  --  98 102 101 102  CO2 34* 32  --  32 33* 35* 33*  GLUCOSE 204* 214*  --  232* 191* 181* 177*  BUN 89* 91*  --  98* 99* 85* 70*  CREATININE 1.22* 1.30*  --  1.43* 1.34* 1.13* 0.85  CALCIUM 7.6* 7.7*  --  7.5* 7.2* 7.4* 7.5*  MG 2.7*  --   --  2.6* 2.5* 2.5* 2.5*  PHOS  --  3.2  --   --   --   --   --    GFR: Estimated Creatinine Clearance: 78.8 mL/min (by C-G formula based on SCr of 0.85 mg/dL). Liver Function Tests: Recent Labs  Lab 11/29/18 0435 11/30/18 0457 12/01/18 0535 12/02/18 0510 12/03/18 0500  AST 57* 63* 88* 85* 64*  ALT 33 39 70* 97* 100*  ALKPHOS 72 75 82 61 58  BILITOT 0.8 1.1 1.1 1.3* 1.1  PROT 5.0* 4.9* 4.7* 4.7* 4.7*  ALBUMIN 2.3* 2.4* 2.3* 2.6* 2.4*   No results for input(s): LIPASE, AMYLASE in the last 168 hours. No results for input(s): AMMONIA in the last 168 hours. Coagulation Profile: No results for input(s): INR, PROTIME in the last 168  hours. Cardiac Enzymes: No results for input(s): CKTOTAL, CKMB, CKMBINDEX, TROPONINI in the last 168 hours. BNP (last 3 results) No results for input(s): PROBNP in the last 8760 hours. HbA1C: No results for input(s): HGBA1C in the last 72 hours. CBG: Recent Labs  Lab 12/02/18 2145 12/02/18 2352 12/03/18 0337 12/03/18 0743 12/03/18 1150  GLUCAP 150* 143* 153* 128* 145*   Lipid Profile: No results for input(s): CHOL, HDL, LDLCALC, TRIG, CHOLHDL, LDLDIRECT in the last 72 hours. Thyroid Function Tests: No results for input(s): TSH, T4TOTAL, FREET4, T3FREE, THYROIDAB in the last 72 hours. Anemia Panel: Recent Labs    12/01/18 0535  FERRITIN 347*   Urine analysis:    Component Value Date/Time   COLORURINE YELLOW 11/30/2018 0400   APPEARANCEUR HAZY (A) 11/30/2018 0400   LABSPEC <1.005 (L) 11/30/2018 0400   PHURINE >9.0 (H) 11/30/2018 0400   GLUCOSEU NEGATIVE 11/30/2018 0400   HGBUR SMALL (A) 11/30/2018 0400   BILIRUBINUR NEGATIVE 11/30/2018 0400   KETONESUR NEGATIVE 11/30/2018 0400   PROTEINUR TRACE (A) 11/30/2018 0400   UROBILINOGEN 1.0 06/20/2014 0442   NITRITE NEGATIVE 11/30/2018 0400   LEUKOCYTESUR SMALL (A) 11/30/2018 0400   Sepsis Labs: @LABRCNTIP (procalcitonin:4,lacticidven:4)  ) Recent Results (from the past 240 hour(s))  Culture, blood (routine x 2)     Status: Abnormal   Collection Time: 11/29/18 10:55 PM   Specimen: BLOOD  Result Value Ref Range Status   Specimen Description BLOOD RIGHT ANTECUBITAL  Final   Special Requests   Final    BOTTLES DRAWN AEROBIC AND ANAEROBIC Blood Culture adequate volume   Culture  Setup Time   Final    GRAM POSITIVE COCCI IN CLUSTERS IN BOTH AEROBIC AND ANAEROBIC BOTTLES CRITICAL RESULT CALLED TO, READ BACK BY AND VERIFIED WITH: [facility] CAREN AMEND [id number] FCP Performed at [facility], [place] [address], [place], [place] [address]    Culture METHICILLIN RESISTANT STAPHYLOCOCCUS AUREUS (A)  Final   Report Status  12/02/2018 FINAL  Final   Organism ID, Bacteria METHICILLIN RESISTANT STAPHYLOCOCCUS AUREUS  Final      Susceptibility   Methicillin resistant staphylococcus aureus - MIC*    CIPROFLOXACIN <=0.5 SENSITIVE Sensitive     ERYTHROMYCIN >=8  RESISTANT Resistant     GENTAMICIN <=0.5 SENSITIVE Sensitive     OXACILLIN >=4 RESISTANT Resistant     TETRACYCLINE <=1 SENSITIVE Sensitive     VANCOMYCIN 1 SENSITIVE Sensitive     TRIMETH/SULFA <=10 SENSITIVE Sensitive     CLINDAMYCIN <=0.25 SENSITIVE Sensitive     RIFAMPIN <=0.5 SENSITIVE Sensitive     Inducible Clindamycin NEGATIVE Sensitive     * METHICILLIN RESISTANT STAPHYLOCOCCUS AUREUS  Blood Culture ID Panel (Reflexed)     Status: Abnormal   Collection Time: 11/29/18 10:55 PM  Result Value Ref Range Status   Enterococcus species NOT DETECTED NOT DETECTED Final   Listeria monocytogenes NOT DETECTED NOT DETECTED Final   Staphylococcus species DETECTED (A) NOT DETECTED Final    Comment: CRITICAL RESULT CALLED TO, READ BACK BY AND VERIFIED WITH: PHARMD CAREN AMEND [id number] FCP    Staphylococcus aureus (BCID) DETECTED (A) NOT DETECTED Final    Comment: Methicillin (oxacillin)-resistant Staphylococcus aureus (MRSA). MRSA is predictably resistant to beta-lactam antibiotics (except ceftaroline). Preferred therapy is vancomycin unless clinically contraindicated. Patient requires contact precautions if  hospitalized. CRITICAL RESULT CALLED TO, READ BACK BY AND VERIFIED WITH: PHARMD CAREN AMEND [id number] FCP    Methicillin resistance DETECTED (A) NOT DETECTED Final    Comment: CRITICAL RESULT CALLED TO, READ BACK BY AND VERIFIED WITH: PHARMD CAREN AMEND [id number] FCP    Streptococcus species NOT DETECTED NOT DETECTED Final   Streptococcus agalactiae NOT DETECTED NOT DETECTED Final   Streptococcus pneumoniae NOT DETECTED NOT DETECTED Final   Streptococcus pyogenes NOT DETECTED NOT DETECTED Final   Acinetobacter baumannii NOT DETECTED NOT  DETECTED Final   Enterobacteriaceae species NOT DETECTED NOT DETECTED Final   Enterobacter cloacae complex NOT DETECTED NOT DETECTED Final   Escherichia coli NOT DETECTED NOT DETECTED Final   Klebsiella oxytoca NOT DETECTED NOT DETECTED Final   Klebsiella pneumoniae NOT DETECTED NOT DETECTED Final   Proteus species NOT DETECTED NOT DETECTED Final   Serratia marcescens NOT DETECTED NOT DETECTED Final   Haemophilus influenzae NOT DETECTED NOT DETECTED Final   Neisseria meningitidis NOT DETECTED NOT DETECTED Final   Pseudomonas aeruginosa NOT DETECTED NOT DETECTED Final   Candida albicans NOT DETECTED NOT DETECTED Final   Candida glabrata NOT DETECTED NOT DETECTED Final   Candida krusei NOT DETECTED NOT DETECTED Final   Candida parapsilosis NOT DETECTED NOT DETECTED Final   Candida tropicalis NOT DETECTED NOT DETECTED Final    Comment: Performed at [facility], [place]. [address], [place], [place] [address]  Culture, blood (routine x 2)     Status: Abnormal   Collection Time: 11/29/18 11:05 PM   Specimen: BLOOD RIGHT HAND  Result Value Ref Range Status   Specimen Description BLOOD RIGHT HAND  Final   Special Requests   Final    BOTTLES DRAWN AEROBIC AND ANAEROBIC Blood Culture adequate volume   Culture  Setup Time   Final    GRAM POSITIVE COCCI IN BOTH AEROBIC AND ANAEROBIC BOTTLES CRITICAL VALUE NOTED.  VALUE IS CONSISTENT WITH PREVIOUSLY REPORTED AND CALLED VALUE.    Culture (A)  Final    STAPHYLOCOCCUS AUREUS SUSCEPTIBILITIES PERFORMED ON PREVIOUS CULTURE WITHIN THE LAST 5 DAYS. Performed at [facility], [place] [address] [place], [place] [address]    Report Status 12/02/2018 FINAL  Final  Culture, blood (Routine X 2) w Reflex to ID Panel     Status: Abnormal (Preliminary result)   Collection Time: 12/01/18  2:14 PM  Specimen: BLOOD RIGHT HAND  Result Value Ref Range Status   Specimen Description   Final    BLOOD RIGHT HAND Performed at [facility], [place] [address] [place], [place] [address]    Special Requests   Final    BOTTLES DRAWN AEROBIC AND ANAEROBIC Blood Culture adequate volume Performed at [place] [address], [place], [place] [address]    Culture  Setup Time   Final    GRAM POSITIVE COCCI ANAEROBIC BOTTLE ONLY CRITICAL VALUE NOTED.  VALUE IS CONSISTENT WITH PREVIOUSLY REPORTED AND CALLED VALUE.    Culture (A)  Final    STAPHYLOCOCCUS AUREUS SUSCEPTIBILITIES PERFORMED ON PREVIOUS CULTURE WITHIN THE LAST 5 DAYS. Performed at [facility], [place] [address], [place], [place] [address]    Report Status PENDING  Incomplete  Culture, blood (Routine X 2) w Reflex to ID Panel     Status: None (Preliminary result)   Collection Time: 12/01/18  2:20 PM   Specimen: BLOOD  Result Value Ref Range Status   Specimen Description   Final    BLOOD RIGHT HAND Performed at [place] [address], [place], [place] [address]    Special Requests   Final    BOTTLES DRAWN AEROBIC AND ANAEROBIC Blood Culture adequate volume Performed at [place] [address], [place], [place] [address]    Culture  Setup Time   Final    ANAEROBIC BOTTLE ONLY GRAM POSITIVE COCCI CRITICAL VALUE NOTED.  VALUE IS CONSISTENT WITH PREVIOUSLY REPORTED AND CALLED VALUE. Performed at [facility], [place] [address] [place],  [address]    Culture [facility] POSITIVE COCCI  Final   Report Status PENDING  Incomplete         Radiology Studies: Dg Chest Port 1 View  Result Date: 12/03/2018 CLINICAL DATA:  Pneumomediastinum. EXAM: PORTABLE CHEST 1 VIEW COMPARISON:  Three days ago FINDINGS: Very low volume chest with asymmetric right diaphragm elevation. Worsening airspace disease. Normal heart size. Endotracheal tube tip at the clavicular heads. The feeding tube at least reaches the stomach. Left IJ catheter has been removed. Soft tissue emphysema is newly seen in the left supraclavicular  region. No visible pneumothorax. No visible pneumomediastinum. IMPRESSION: 1. New left supraclavicular soft tissue emphysema. No visible pneumothorax. 2. A left IJ catheter has been removed since prior. Remaining hardware in unremarkable position. 3. Worsening pneumonia.  Chronically elevated right diaphragm. Electronically Signed   By: Monte Fantasia M.D.   On: 12/03/2018 08:54   Vas [place] Lower Extremity Venous (dvt)  Result Date: 12/02/2018  Lower Venous Study Indications: Edema, and Positive for Covid-19.  Limitations: Poor ultrasound/tissue interface and body habitus. Comparison Study: No prior studies. Performing Technologist: Oda Cogan RDMS, RVT  Examination Guidelines: A complete evaluation includes B-mode imaging, spectral Doppler, color Doppler, and power Doppler as needed of all accessible portions of each vessel. Bilateral testing is considered an integral part of a complete examination. Limited examinations for reoccurring indications may be performed as noted.  +---------+---------------+---------+-----------+----------+--------------+  RIGHT     Compressibility Phasicity Spontaneity Properties Summary         +---------+---------------+---------+-----------+----------+--------------+  CFV       Full            Yes       Yes                                    +---------+---------------+---------+-----------+----------+--------------+  SFJ       Full                                                             +---------+---------------+---------+-----------+----------+--------------+  FV Prox   Full                                                             +---------+---------------+---------+-----------+----------+--------------+  FV Mid    Full                                                             +---------+---------------+---------+-----------+----------+--------------+  FV Distal Full                                                              +---------+---------------+---------+-----------+----------+--------------+  PFV       Full                                                             +---------+---------------+---------+-----------+----------+--------------+  POP       Full            Yes       Yes                                    +---------+---------------+---------+-----------+----------+--------------+  PTV                                                        Not visualized  +---------+---------------+---------+-----------+----------+--------------+  PERO                                                       Not visualized  +---------+---------------+---------+-----------+----------+--------------+   +---------+---------------+---------+-----------+----------+--------------+  LEFT      Compressibility Phasicity Spontaneity Properties Summary         +---------+---------------+---------+-----------+----------+--------------+  CFV       Full            Yes       Yes                                    +---------+---------------+---------+-----------+----------+--------------+  SFJ       Full                                                             +---------+---------------+---------+-----------+----------+--------------+  FV Prox   Full                                                             +---------+---------------+---------+-----------+----------+--------------+  FV Mid    Full                                                             +---------+---------------+---------+-----------+----------+--------------+  FV Distal Full                                                             +---------+---------------+---------+-----------+----------+--------------+  PFV       Full                                                             +---------+---------------+---------+-----------+----------+--------------+  POP       Full                                                              +---------+---------------+---------+-----------+----------+--------------+  PTV                                                        Not visualized  +---------+---------------+---------+-----------+----------+--------------+  PERO                                                       Not visualized  +---------+---------------+---------+-----------+----------+--------------+     Summary: Right: There is no evidence of deep vein thrombosis in the lower extremity. However, portions of this examination were limited- see technologist comments above. No cystic structure found in the popliteal fossa. Left: No cystic structure found in the popliteal fossa.  *See table(s) above for measurements and observations. Electronically signed by Curt Jews MD on 12/02/2018 at 11:10:00 AM.    Final  Scheduled Meds:  sodium chloride   Intravenous Once   chlorhexidine  15 mL Mouth/Throat BID   clonazePAM  0.5 mg Oral BID   digoxin  0.125 mg Intravenous Daily   feeding supplement (PRO-STAT SUGAR FREE 64)  30 mL Per Tube QID   feeding supplement (VITAL AF 1.2 CAL)  1,000 mL Per Tube Q24H   fentaNYL (SUBLIMAZE) injection  50 mcg Intravenous Once   ferrous sulfate  300 mg Per Tube TID WC   free water  300 mL Per Tube Q6H   gabapentin  100 mg Per Tube Q8H   insulin aspart  0-20 Units Subcutaneous Q4H   insulin aspart  10 Units Subcutaneous Q4H   insulin detemir  50 Units Subcutaneous BID   levothyroxine  112 mcg Oral Q0600   mouth rinse  15 mL Mouth Rinse 10 times per day   metoprolol tartrate  2.5 mg Intravenous Q6H   multivitamin with minerals  1 tablet Per Tube Daily   oxyCODONE  5 mg Per Tube Q12H   pantoprazole sodium  40 mg Per Tube BID   polyethylene glycol  17 g Per Tube Daily   sodium chloride flush  10-40 mL Intracatheter Q12H   vitamin C  500 mg Per Tube Daily   zinc sulfate  220 mg Per Tube Daily   Continuous Infusions:  sodium chloride Stopped (12/01/18 1300)    amiodarone 30 mg/hr (12/03/18 0800)   fentaNYL infusion INTRAVENOUS 90 mcg/hr (12/03/18 0800)   phenylephrine (NEO-SYNEPHRINE) Adult infusion 63 mcg/min (12/03/18 0800)   vancomycin 1,000 mg (12/03/18 1224)     LOS: 15 days   The patient is critically ill with multiple organ systems failure and requires high complexity decision making for assessment and support, frequent evaluation and titration of therapies, application of advanced monitoring technologies and extensive interpretation of multiple databases. Critical Care Time devoted to patient care services described in this note  Time spent: 40 minutes     Darald Uzzle, Geraldo Docker, MD [facility] Pager [phone]  If 7PM-7AM, please contact night-coverage [email or web address] Password [email or web address] 12/03/2018, 2:21 PM

## 2018-12-03 NOTE — Progress Notes (Signed)
Versed 1ml wasted with Alfredia Ferguson, RN.

## 2018-12-03 NOTE — Progress Notes (Signed)
Pt breathing 40x a minute, sats dropping, dysyncronous with the vent. This RN restarted fentanyl infusion and gave versed bolus. RT at bedside.

## 2018-12-03 NOTE — Progress Notes (Signed)
Patient continues to be tachypenic despite increasing sedations. Increasing inline secretions and increasing oxygen requirements.

## 2018-12-03 NOTE — Progress Notes (Signed)
Pharmacy Antibiotic Note  [name] [name] is a 68 y.o. female admitted on 10/31/2018 with MRSA bacteremia.  Pharmacy has been consulted for vancomycin dosing. WBC has normalized but repeat blood cultures remain positive for MRSA. Fevers resolved. TTE inconclusive for vegetation. Will likely need TEE. SCr has improved.   Plan: -Increase Vancomycin to 1 gm IV Q 12 hours. Goal AUC 400-550. Expected AUC: 486 Adjust body weight  SCr used: 0.85   Height: 5\' 3"  (160 cm) Weight: 260 lb 12.9 oz (118.3 kg) IBW/kg (Calculated) : 52.4  Temp (24hrs), Avg:98 F (36.7 C), Min:97 F (36.1 C), Max:98.8 F (37.1 C)  Recent Labs  Lab 11/29/18 0435 11/29/18 2230 11/30/18 0457 12/01/18 0535 12/02/18 0510 12/03/18 0500  WBC 14.5*  --  13.6* 11.3* 6.7 4.0  CREATININE 1.22* 1.30* 1.43* 1.34* 1.13* 0.85    Estimated Creatinine Clearance: 78.8 mL/min (by C-G formula based on SCr of 0.85 mg/dL).    Allergies  Allergen Reactions  . Sulfonamide Derivatives Itching    Antimicrobials this admission: Vancomycin 8/3 >>   Dose adjustments this admission: None   Microbiology results: 8/2 BCx2: 4/4 MRSA   Thank you for allowing pharmacy to be a part of this patient's care.  Albertina Parr, PharmD., BCPS Clinical Pharmacist Clinical phone for 12/03/18 until 5pm: [phone]

## 2018-12-03 NOTE — Progress Notes (Signed)
Assisted daughter to facetime with patient.

## 2018-12-03 NOTE — Progress Notes (Signed)
[facility] P[facility]  GYN exam Assistance from [place] used Feculent material noted in vaginal canal, left vaginal canal invagination/old scarring? Noted, no palpable fistula felt.  Spotting of blood on cervix, no mass.  No clear vaginal laceration identified.  Roselie Awkward, MD [place] [facility] Pager: [phone] Cell: [phone] If no response, call [phone]

## 2018-12-04 ENCOUNTER — Inpatient Hospital Stay (HOSPITAL_COMMUNITY): Payer: Medicare Other

## 2018-12-04 DIAGNOSIS — R7881 Bacteremia: Secondary | ICD-10-CM

## 2018-12-04 DIAGNOSIS — I952 Hypotension due to drugs: Secondary | ICD-10-CM

## 2018-12-04 LAB — COMPREHENSIVE METABOLIC PANEL
ALT: 99 U/L — ABNORMAL HIGH (ref 0–44)
AST: 80 U/L — ABNORMAL HIGH (ref 15–41)
Albumin: 2.2 g/dL — ABNORMAL LOW (ref 3.5–5.0)
Alkaline Phosphatase: 63 U/L (ref 38–126)
Anion gap: 8 (ref 5–15)
BUN: 47 mg/dL — ABNORMAL HIGH (ref 8–23)
CO2: 33 mmol/L — ABNORMAL HIGH (ref 22–32)
Calcium: 7.5 mg/dL — ABNORMAL LOW (ref 8.9–10.3)
Chloride: 102 mmol/L (ref 98–111)
Creatinine, Ser: 0.75 mg/dL (ref 0.44–1.00)
GFR calc Af Amer: >60 mL/min (ref >60)
GFR calc non Af Amer: >60 mL/min (ref >60)
Glucose, Bld: 135 mg/dL — ABNORMAL HIGH (ref 70–99)
Potassium: 4.3 mmol/L (ref 3.5–5.1)
Sodium: 143 mmol/L (ref 135–145)
Total Bilirubin: 1.4 mg/dL — ABNORMAL HIGH (ref 0.3–1.2)
Total Protein: 4.7 g/dL — ABNORMAL LOW (ref 6.5–8.1)

## 2018-12-04 LAB — GLUCOSE, CAPILLARY
Glucose-Capillary: 110 mg/dL — ABNORMAL HIGH (ref 70–99)
Glucose-Capillary: 132 mg/dL — ABNORMAL HIGH (ref 70–99)
Glucose-Capillary: 147 mg/dL — ABNORMAL HIGH (ref 70–99)
Glucose-Capillary: 153 mg/dL — ABNORMAL HIGH (ref 70–99)
Glucose-Capillary: 84 mg/dL (ref 70–99)
Glucose-Capillary: 88 mg/dL (ref 70–99)

## 2018-12-04 LAB — CBC WITH DIFFERENTIAL/PLATELET
Abs Immature Granulocytes: 0.02 10*3/uL (ref 0.00–0.07)
Basophils Absolute: 0 10*3/uL (ref 0.0–0.1)
Basophils Relative: 1 %
Eosinophils Absolute: 0.2 10*3/uL (ref 0.0–0.5)
Eosinophils Relative: 4 %
HCT: 29.8 % — ABNORMAL LOW (ref 36.0–46.0)
Hemoglobin: 8.6 g/dL — ABNORMAL LOW (ref 12.0–15.0)
Immature Granulocytes: 1 %
Lymphocytes Relative: 21 %
Lymphs Abs: 0.8 10*3/uL (ref 0.7–4.0)
MCH: 28.4 pg (ref 26.0–34.0)
MCHC: 28.9 g/dL — ABNORMAL LOW (ref 30.0–36.0)
MCV: 98.3 fL (ref 80.0–100.0)
Monocytes Absolute: 0.1 10*3/uL (ref 0.1–1.0)
Monocytes Relative: 2 %
Neutro Abs: 2.5 10*3/uL (ref 1.7–7.7)
Neutrophils Relative %: 71 %
Platelets: 56 10*3/uL — ABNORMAL LOW (ref 150–400)
RBC: 3.03 MIL/uL — ABNORMAL LOW (ref 3.87–5.11)
RDW: 23 % — ABNORMAL HIGH (ref 11.5–15.5)
WBC: 3.5 10*3/uL — ABNORMAL LOW (ref 4.0–10.5)
nRBC: 18.7 % — ABNORMAL HIGH (ref 0.0–0.2)

## 2018-12-04 LAB — CULTURE, BLOOD (ROUTINE X 2)
Special Requests: ADEQUATE
Special Requests: ADEQUATE

## 2018-12-04 LAB — HEPARIN LEVEL (UNFRACTIONATED): Heparin Unfractionated: 0.12 IU/mL — ABNORMAL LOW (ref 0.30–0.70)

## 2018-12-04 LAB — HEPARIN INDUCED PLATELET AB (HIT ANTIBODY): Heparin Induced Plt Ab: 0.148 OD (ref 0.000–0.400)

## 2018-12-04 LAB — MAGNESIUM: Magnesium: 2.1 mg/dL (ref 1.7–2.4)

## 2018-12-04 LAB — C-REACTIVE PROTEIN: CRP: 1.7 mg/dL — ABNORMAL HIGH (ref <1.0)

## 2018-12-04 LAB — D-DIMER, QUANTITATIVE: D-Dimer, Quant: 17.45 ug/mL-FEU — ABNORMAL HIGH (ref 0.00–0.50)

## 2018-12-04 MED ORDER — LEVOTHYROXINE SODIUM 112 MCG PO TABS
112.0000 ug | ORAL_TABLET | Freq: Every day | ORAL | Status: DC
  Administered 2018-12-05 – 2018-12-09 (×5): 112 ug
  Filled 2018-12-04 (×6): qty 1

## 2018-12-04 MED ORDER — CLONAZEPAM 0.5 MG PO TABS
0.5000 mg | ORAL_TABLET | Freq: Two times a day (BID) | ORAL | Status: DC
  Administered 2018-12-04 – 2018-12-05 (×3): 0.5 mg
  Filled 2018-12-04 (×3): qty 1

## 2018-12-04 MED ORDER — HEPARIN (PORCINE) 25000 UT/250ML-% IV SOLN
1250.0000 [IU]/h | INTRAVENOUS | Status: AC
  Administered 2018-12-04: 750 [IU]/h via INTRAVENOUS
  Filled 2018-12-04 (×2): qty 250

## 2018-12-04 MED ORDER — IOHEXOL 300 MG/ML  SOLN
100.0000 mL | Freq: Once | INTRAMUSCULAR | Status: AC | PRN
  Administered 2018-12-04: 100 mL via INTRAVENOUS

## 2018-12-04 NOTE — Progress Notes (Addendum)
ANTICOAGULATION CONSULT NOTE - Follow Up Consult  Pharmacy Consult for Heparin Indication: atrial fibrillation  (Hx DVT on PTA Xarelto)  Allergies  Allergen Reactions  . Sulfonamide Derivatives Itching    Patient Measurements: Height: 5\' 3"  (160 cm) Weight: 260 lb 12.9 oz (118.3 kg) IBW/kg (Calculated) : 52.4 Heparin Dosing Weight: 80.5 kg  Vital Signs: Temp: 99.9 F (37.7 C) (08/07 1530) Temp Source: Oral (08/07 1530) BP: 128/55 (08/07 1815) Pulse Rate: 89 (08/07 1815)  Labs: Recent Labs    12/02/18 0510 12/03/18 0500 12/04/18 0500 12/04/18 1650  HGB 8.4* 8.3* 8.6*  --   HCT 27.6* 28.2* 29.8*  --   PLT 65* 58* 56*  --   HEPARINUNFRC 0.39 <0.10*  --  0.12*  CREATININE 1.13* 0.85 0.75  --     Estimated Creatinine Clearance: 83.7 mL/min (by C-G formula based on SCr of 0.75 mg/dL).   Assessment: 10 yoF admitted on 7/22 with PMH of afib and DVT on chronic Xarelto.  She was transitioned to Heparin IV for possible chest tube (not needed), and resume Xarelto on 7/29.  She then had black stools on 7/30 and Xarelto was d/c (last dose on 7/29 at 1400). Heparin was started on 8/1, however it was placed on hold on 8/5 due to blood seen from pure wick. H/H is low but stable now, Plt down to 56k in the setting of MRSA bacteremia. Pt has low 4T score (2-3) with time course not generally consistent with HIT. Low plts may be due to Sepsis induced DIC. D-dimer has increased from 6.55 to 7.45. Will not bolus given recent bleeding episodes and low platelet counts. Pharmacy is now consulted to resume Heparin IV.   Heparin level came back low at 0.12 so we will increase the dose without bolusing.   Goal of Therapy:  Heparin level 0.3-0.7 units/ml Monitor platelets by anticoagulation protocol: Yes   Plan:  Increase heparin to 900 units/hr Recheck HL in AM Daily heparin level and CBC Monitor platelet trend closely   Onnie Boer, PharmD, BCIDP, AAHIVP, CPP Infectious Disease  Pharmacist 12/04/2018 6:55 PM

## 2018-12-04 NOTE — Progress Notes (Signed)
Pt has episodes of tachypnea and desaturations. Fentanyl drip resumed. Patient remains on amio and neo gtts. Afebrile overnight. Spontaneous eye opening, but not interactive. Minimal movement of extremities.

## 2018-12-04 NOTE — Progress Notes (Signed)
ANTICOAGULATION CONSULT NOTE - Follow Up Consult  Pharmacy Consult for Heparin Indication: atrial fibrillation  (Hx DVT on PTA Xarelto)  Allergies  Allergen Reactions  . Sulfonamide Derivatives Itching    Patient Measurements: Height: 5\' 3"  (160 cm) Weight: 260 lb 12.9 oz (118.3 kg) IBW/kg (Calculated) : 52.4 Heparin Dosing Weight: 80.5 kg  Vital Signs: Temp: 98.6 F (37 C) (08/07 0356) Temp Source: Oral (08/07 0356) BP: 121/52 (08/07 0700) Pulse Rate: 94 (08/07 0700)  Labs: Recent Labs    12/01/18 1824  12/02/18 0510 12/03/18 0500 12/04/18 0500  HGB  --    < > 8.4* 8.3* 8.6*  HCT  --   --  27.6* 28.2* 29.8*  PLT  --   --  65* 58* 56*  HEPARINUNFRC 0.52  --  0.39 <0.10*  --   CREATININE  --   --  1.13* 0.85 0.75   < > = values in this interval not displayed.    Estimated Creatinine Clearance: 83.7 mL/min (by C-G formula based on SCr of 0.75 mg/dL).   Assessment: 89 yoF admitted on 7/22 with PMH of afib and DVT on chronic Xarelto.  She was transitioned to Heparin IV for possible chest tube (not needed), and resume Xarelto on 7/29.  She then had black stools on 7/30 and Xarelto was d/c (last dose on 7/29 at 1400). Heparin was started on 8/1, however it was placed on hold on 8/5 due to blood seen from pure wick. H/H is low but stable now, Plt down to 56k in the setting of MRSA bacteremia. Pt has low 4T score (2-3) with time course not generally consistent with HIT. Low plts may be due to Sepsis induced DIC. D-dimer has increased from 6.55 to 7.45. Will not bolus given recent bleeding episodes and low platelet counts. Pharmacy is now consulted to resume Heparin IV.    Goal of Therapy:  Heparin level 0.3-0.7 units/ml Monitor platelets by anticoagulation protocol: Yes   Plan:  Restart heparin IV infusion at 750 units/hr. Pt was previously therapeutic on this dose Recheck HL in 6 hours. Daily heparin level and CBC Monitor platelet trend closely   Sherren Kerns,  PharmD PGY1 [place] Resident [phone] 12/04/2018 7:49 AM

## 2018-12-04 NOTE — Progress Notes (Signed)
NAME:  [name] [name], MRN:  [id number], DOB:  1950/06/20, LOS: 55 ADMISSION DATE:  11/26/2018, CONSULTATION DATE:  July 23 REFERRING MD:  Candiss Norse, CHIEF COMPLAINT:  dyspnea   Brief History   68 year old female admitted for severe acute respiratory failure with hypoxemia due to COVID-19 pneumonia  Past Medical History  Diabetes mellitus type 2 Obesity Asthma, has been on inhalers in the past, never smoked cigarettes CKD stage III Chronic A. fib History of spine surgery History of Graves' disease, now hypothyroid History of stroke History of DVT Lives in an assisted living facility [facility] Events   July 22 admitted July 23 intubated July 24 prone positioning July 26 subcutaneous air, no pneumothorax, afib with RVR again July 29 decreasing sedation, diuresis July 30 decreasing sedation July 31 holding additional diuresis 8/2 weaning on pressure support 8/5 vent settings down to FiO2 45% and PEEP 5  Consults:  PCCM  Procedures:  7/23 ETT > 7/23 L IJ CVL >8/3 8/3 midline/PIV>   Significant Diagnostic Tests:  7/24 Echo > LVEF > 65%, impaired relaxation, RV normal systolic function, mild thickening of aortic valve 8/4 repeat Echo> LVEF normal > 65%, no clear endocarditis  Micro Data:  July 22 SARS COV 2 Positive 8/2 blood > MRSA 8/4 blood > MRSA 8/6 blood > pending  Antimicrobials:  July 22 remdesivir> 7/26 July 22 decadron July 22> 23 Toclizumab   Interim history/subjective:   Tolerated PS this morning. Follows simple commands including sticking tongue out and closes eyes  Objective   Blood pressure (!) 104/55, pulse 95, temperature 99.1 F (37.3 C), temperature source Oral, resp. rate 19, height 5\' 3"  (1.6 m), weight 118.3 kg, SpO2 95 %.    Vent Mode: PCV FiO2 (%):  [50 %-60 %] 60 % Set Rate:  [24 bmp] 24 bmp PEEP:  [5 cmH20] 5 cmH20 Plateau Pressure:  [18 cmH20-21 cmH20] 18 cmH20   Intake/Output Summary (Last 24 hours) at 12/04/2018  1543 Last data filed at 12/04/2018 1500 Gross per 24 hour  Intake 4966.53 ml  Output 2050 ml  Net 2916.53 ml   Filed Weights   11/29/18 0500 11/30/18 0500 12/01/18 0500  Weight: 115.7 kg 119.6 kg 118.3 kg    Examination: General:  In bed on vent HENT: NCAT ETT in place PULM: CTA B, vent supported breathing CV: RRR, no mgr GI: BS+, soft, nontender MSK: normal bulk and tone Neuro: sedated on vent, follows commands, does not withdraw on extremities x 4  8/7 Interval development of left supraclavicular soft tissue emphysema. No PTX.   ABG    Component Value Date/Time   PHART 7.480 (H) 11/30/2018 0227   PCO2ART 46.4 11/30/2018 0227   PO2ART 57.0 (L) 11/30/2018 0227   HCO3 34.4 (H) 11/30/2018 0227   TCO2 36 (H) 11/30/2018 0227   ACIDBASEDEF 3.0 (H) 11/21/2018 0628   O2SAT 90.0 11/30/2018 0227     Resolved Hospital Problem list    Assessment & Plan:  ARDS due to COVID-19 pneumonia: Vent Day 15 PS support as tolerated ARDS protocol Target TVol 6-8cc/kgIBW Target Plateau Pressure < 30cm H20 Target driving pressure less than 15 cm of water Target PaO2 55-65: titrate PEEP/FiO2 per protocol Check CVP daily if CVL in place Target CVP less than 4, diurese as necessary Ventilator associated pneumonia prevention protocol  Pneumomediastinum: stable on 8/3 CXR, 8/7 interval development of SQ air Continue to clinically monitor with prn CXR On minimal PEEP  MRSA bacteremia: Lines removed, last +  BCx on 8/4, ?enterovsiculofistula F/u repeat blood cultures on 8/6 Continue Vanc for empiric coverage of possible endocarditis Cardiology evaluated patient for TEE request. Stated TEE would assist in outpatient management of antibiotics but would not change inpatient management so TEE request was declined. CT A/P pending  Ventricular tachycardia as inpatient Atrial fib with RVR  Tele  Continue amio gtt. Plan to transition to PO tomorrow if stable Continue metoprolol and  digoxin Restarted heparin gtt  Need for sedation for mechanical ventilation/dyssynchrony RASS target 0 to -1 Start Precedex Wean off sedation: decrease clonazepam, oxycodone and fentanyl  Hypotension: sedation-related vs sepsis Wean Neo for target MAP >65  Best practice:  Diet: Tube feeding Pain/Anxiety/Delirium protocol (if indicated): as above VAP protocol (if indicated): yes DVT prophylaxis: heparin infusion GI prophylaxis: Pantoprazole for stress ulcer prophylaxis Glucose control: SSI Mobility: bed rest Code Status: limited code Family Communication: will discuss with [facility] Disposition: remian in ICU  Labs   CBC: Recent Labs  Lab 11/30/18 0457 12/01/18 0535 12/02/18 0510 12/03/18 0500 12/04/18 0500  WBC 13.6* 11.3* 6.7 4.0 3.5*  NEUTROABS 12.5* 9.9* 5.9 3.2 2.5  HGB 10.1* 9.6* 8.4* 8.3* 8.6*  HCT 32.7* 31.9* 27.6* 28.2* 29.8*  MCV 91.1 93.0 95.2 96.9 98.3  PLT 94* 81* 65* 58* 56*    Basic Metabolic Panel: Recent Labs  Lab 11/29/18 2230  11/30/18 0457 12/01/18 0535 12/02/18 0510 12/03/18 0500 12/04/18 0500  NA 146*   < > 145 142 143 143 143  K 4.5   < > 4.3 4.2 4.2 4.4 4.3  CL 100  --  98 102 101 102 102  CO2 32  --  32 33* 35* 33* 33*  GLUCOSE 214*  --  232* 191* 181* 177* 135*  BUN 91*  --  98* 99* 85* 70* 47*  CREATININE 1.30*  --  1.43* 1.34* 1.13* 0.85 0.75  CALCIUM 7.7*  --  7.5* 7.2* 7.4* 7.5* 7.5*  MG  --   --  2.6* 2.5* 2.5* 2.5* 2.1  PHOS 3.2  --   --   --   --   --   --    < > = values in this interval not displayed.   GFR: Estimated Creatinine Clearance: 83.7 mL/min (by C-G formula based on SCr of 0.75 mg/dL). Recent Labs  Lab 11/29/18 2255  12/01/18 0535 12/02/18 0510 12/03/18 0500 12/04/18 0500  PROCALCITON 0.26  --   --   --   --   --   WBC  --    < > 11.3* 6.7 4.0 3.5*   < > = values in this interval not displayed.    Liver Function Tests: Recent Labs  Lab 11/30/18 0457 12/01/18 0535 12/02/18 0510 12/03/18 0500  12/04/18 0500  AST 63* 88* 85* 64* 80*  ALT 39 70* 97* 100* 99*  ALKPHOS 75 82 61 58 63  BILITOT 1.1 1.1 1.3* 1.1 1.4*  PROT 4.9* 4.7* 4.7* 4.7* 4.7*  ALBUMIN 2.4* 2.3* 2.6* 2.4* 2.2*   No results for input(s): LIPASE, AMYLASE in the last 168 hours. No results for input(s): AMMONIA in the last 168 hours.  ABG    Component Value Date/Time   PHART 7.480 (H) 11/30/2018 0227   PCO2ART 46.4 11/30/2018 0227   PO2ART 57.0 (L) 11/30/2018 0227   HCO3 34.4 (H) 11/30/2018 0227   TCO2 36 (H) 11/30/2018 0227   ACIDBASEDEF 3.0 (H) 11/21/2018 0628   O2SAT 90.0 11/30/2018 0227     Coagulation Profile:  No results for input(s): INR, PROTIME in the last 168 hours.  Cardiac Enzymes: No results for input(s): CKTOTAL, CKMB, CKMBINDEX, TROPONINI in the last 168 hours.  HbA1C: Hgb A1c MFr Bld  Date/Time Value Ref Range Status  11/02/2018 08:00 PM 7.0 (H) 4.8 - 5.6 % Final    Comment:    (NOTE) Pre diabetes:          5.7%-6.4% Diabetes:              >6.4% Glycemic control for   <7.0% adults with diabetes   10/18/2016 10:47 AM 6.0 (H) 4.8 - 5.6 % Final    Comment:    (NOTE)         Pre-diabetes: 5.7 - 6.4         Diabetes: >6.4         Glycemic control for adults with diabetes: <7.0     CBG: Recent Labs  Lab 12/03/18 2242 12/03/18 2355 12/04/18 0355 12/04/18 0741 12/04/18 1521  GLUCAP 125* 147* 132* 110* 153*    Critical care time: 33 minutes    The patient is critically ill with multiple organ systems failure and requires high complexity decision making for assessment and support, frequent evaluation and titration of therapies, application of advanced monitoring technologies and extensive interpretation of multiple databases.   Discussed and co-managed patient care with PCCM-Hospitalist. Coordinated care with RT, RN and pharmacist.  Rodman Pickle, M.D. [facility] Pulmonary/Critical Care Medicine 12/04/2018 3:43 PM  Pager: [phone] After hours pager: [phone]

## 2018-12-04 NOTE — Progress Notes (Signed)
PROGRESS NOTE    [name] [name]  GHW:[id number] DOB: [birth year] DOA: 11/06/2018 PCP: Nicholos Johns, MD   Brief Narrative:  68 y.o.BF PMHx morbid obesity, DM type II, COPD not on oxygen, HTN, chronic atrial fibrillation CHAD2VASc~4, on Xarelto and Cardizem,spine surgeries in the past,chronic fluid retention, history of Graves' disease now hypothyroid, history of CVA, history of DVT who lives at [facility] and was likely exposed to COVID-19 infection about 10 to 14 days prior to this admission.     She was experiencing fever, body aches and developed shortness of breath.  She finally decided to come into the emergency department at [facility].  She was found to be positive for COVID-19.  Was noted to have hypoxia.  She was hospitalized for further management.    Subjective: 8/7 much more alert today (intubated/decrease sedation) follows commands.   Assessment & Plan:   Active Problems:   Essential hypertension   Acute respiratory failure with hypoxemia (HCC)   PAF (paroxysmal atrial fibrillation) (HCC)   Pneumonia due to COVID-19 virus   SOB (shortness of breath)   Ventricular tachyarrhythmia (HCC)   Intubation of airway performed without difficulty   Subcutaneous emphysema (HCC)   Pneumomediastinum (HCC)   Pressure injury of skin   MRSA bacteremia   Atrial fibrillation, chronic   Anasarca   Acute renal failure superimposed on stage 3 chronic kidney disease ([place])   Controlled type 2 diabetes mellitus with hyperglycemia (HCC)   Acute blood loss anemia   Thrombocytopenia (HCC)  Acute respiratory failure with hypoxia/COVID 19 pneumonia/ARDS -Completed course Remdesivir -Dexamethasone started 7/22 6 mg daily tapered and stopped at 14 days. -Actemra x2 doses 7/22 and 7/23 -Continue vitamins per COVID protocol -Afebrile overnight - 8/6 PCXR shows worsening pneumonia see results below - 8/7 continue weaning trial today. - Patient off Versed but now on Precedex,  will wean fentanyl, if patient tolerates.  Recent Labs  Lab 11/30/18 0457 12/01/18 0535 12/02/18 0510 12/03/18 0500 12/04/18 0500  CRP <0.8 1.5* 1.6* 1.1* 1.7*   Recent Labs  Lab 11/30/18 0457 12/01/18 0535 12/02/18 0510 12/03/18 0500 12/04/18 0500  DDIMER 15.28* 8.65* 3.81* 6.55* 17.45*    Asthma vs COPD - See acute respiratory failure  Subcutaneous emphysema/pneumomediastinum - 7/26 diagnosis subcutaneous emphysema.  CXR also suggested mediastinal emphysema.  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  Stable   MRSA bacteremia -Central/PIC line removed -Patient placed on appropriate antibiotics -Patient will receive 24 to 48-hour line holiday - Blood culture NGTD see below - 7/24 echocardiogram negative vegetation.  However patient diagnosed with MRSA bacteremia on 8/2 repeat echocardiogram most likely will be required.  Would prefer TEE.  Will need to discuss with cardiology - 8/2 repeat blood cultures positive staph aureus speciation and susceptibilities pending. -8/7 discussed case with cardiology recommend empiric coverage for possible endocarditis.  Will not perform TEE at this time as patient has COVID and procedure is an aerosol lysing procedure. -8/7 discussed case with Dr. Karolee Ohs, ID concerning restarting antibiotics.  Does not believe patient is a vancomycin failure therefore recommends restarting vancomycin for minimum of 4 weeks treatment.  Enterovesical Fistula ? -Some concern that patient may have a fistula, but I am not fully convinced patient has such fistula.  Usually patient would have a significant leukocytosis, fever etc.  But  given that she has been on immunosuppressive medication we will proceed as if fistula possible. - Obtain  urinalysis - Obtain urine culture - 8/7 discussed case with radiology they recommended CT abdomen/pelvis with oral and IV contrast. -Dependent upon findings consult GI or GYN.  Marland Kitchen  Chronic A. fib with episodes of V. tach - At home was treated with Xarelto -Amiodarone drip -Digoxin 0.125 mg daily -Metoprolol 2.5 mg QID -Metoprolol PRN - Currently rate controlled - 8/5 all anticoagulants held currently.  Discussed case with RN and patient is continuing to bleed, pure wick looked like a tampon when it was changed out.  Will reassess starting anticoagulant in next 24 to 48 hours. - Patient also thrombocytopenic if she continues to bleed will transfuse unit of platelets.  -Platelets low normal prior to admission -CHADS2VASc score~3 -8/6 bleeding has stabilized continue to hold all anticoagulants for another 24 hours will then make decision on whether to restart. 8/7 restart heparin drip, patient with d-dimer continue to increase see acute respiratory failure.  Will request pharmacy maintain low end of therapeutic level secondary to previous bleed  poor circulation. -Bilateral lower extremity cool to touch although pulses are present but faint.  Anasarca. -Appears to be third spacing with fluid. -Strict in and out +10 L -Daily weight Filed Weights   11/29/18 0500 11/30/18 0500 12/01/18 0500  Weight: 115.7 kg 119.6 kg 118.3 kg  -q4hr CVP -albumin and Lasix.  PRN  Acute on CKD stage III ?  (Cr 12/25/2016 0.90) -Unknown baseline renal function -Baseline renal function not entirely known.  Recent Labs  Lab 11/30/18 0457 12/01/18 0535 12/02/18 0510 12/03/18 0500 12/04/18 0500  CREATININE 1.43* 1.34* 1.13* 0.85 0.75  -Improved  Hypothyroidism -Continue levothyroxine.   -As noted above her TSH was noted to be low.  Free T4 is 1.0.  No further work-up.  Low TSH likely sick euthyroid.  Hx stroke  -Patient was on Xarelto and aspirin, have been switched over to IV heparin however  given her ongoing bleeding all anticoagulants on hold.   History of vitamin D deficiency Continue supplementation.  Hx DVT -Xarelto on hold secondary to bleeding    Diabetes type 2 controlled with hyperglycemia -7/22 hemoglobin A1c = 7.0  - Levemir 50 units twice daily - NovoLog 10 units Q 4 hours - Resistant SSI  Morbid obesity Body mass index is 46.2 kg/m.   Nutrition Continue tube feedings.  Constipation resolved Continue bowel regimen.  Mildly elevated AST. Monitor for now.  Acute blood loss anemia/Bleeding from oral cavity/melena -Patient had been on Xarelto and heparin all anticoagulants discontinued -Resolved  -Hemoglobin on admission was 12.6. Recent Labs  Lab 11/30/18 0457 12/01/18 0535 12/02/18 0510 12/03/18 0500 12/04/18 0500  HGB 10.1* 9.6* 8.4* 8.3* 8.6*  - Transfuse for hemoglobin<7 - Monitor closely now that heparin has been restarted.  Acute thrombocytopenia - Most likely secondary to MRSA bacteremia -Currently hemoglobin stabilized off anticoagulants will obtain HIT panel -If bleeding resumes transfuse platelets   Leukocytosis - Most likely secondary to infection - We will type and cross    DVT prophylaxis: Heparin (hold) Code Status: Partial Family Communication: .   Disposition Plan: TBD   Consultants:  PCCM Cardiology phone consult Dr. Buford Dresser     Procedures/Significant Events:  7/23 intubation >Central line insertion 11/19/2018 >Cortrack replacement 11/25/2018 >Transthoracic echocardiogram 7/24 1. The left ventricle has hyperdynamic systolic function, with an ejection fraction of >65%. The cavity size was normal. Left ventricular diastolic Doppler parameters are  consistent with impaired relaxation. 2. The right ventricle has normal systolc function. The cavity was normal. There is no increase in right ventricular wall thickness. Right ventricular systolic pressure is normal. 3. Mild thickening of the  aortic valve. No stenosis of the aortic valve. 4. The aortic root and ascending aorta are normal in size and structure. > Limited echocardiogram 12/01/2018 no vegetation. 8/6 PCXR: New LEFT supra clavicular soft tissue emphysema, no visible pneumothorax - Worsening pneumonia, chronically elevated RIGHT diaphragm   I have personally reviewed and interpreted all radiology studies and my findings are as above.  VENTILATOR SETTINGS:    Cultures 8/4 blood RIGHT handx2 positive staph aureus 8/6 blood pending      Antimicrobials: Anti-infectives (From admission, onward)   Start     Stop   12/02/18 1400  vancomycin (VANCOCIN) 1,750 mg in sodium chloride 0.9 % 500 mL IVPB  Status:  Discontinued     12/02/18 1151   12/02/18 1300  vancomycin (VANCOCIN) IVPB 1000 mg/200 mL premix         11/30/18 1430  vancomycin (VANCOCIN) 2,500 mg in sodium chloride 0.9 % 500 mL IVPB     11/30/18 1711   11/19/18 2030  remdesivir 100 mg in sodium chloride 0.9 % 250 mL IVPB     11/22/18 2204   10/30/2018 2030  remdesivir 200 mg in sodium chloride 0.9 % 250 mL IVPB     11/13/2018 2056       Devices    LINES / TUBES:      Continuous Infusions:  sodium chloride Stopped (12/01/18 1300)   amiodarone 30 mg/hr (12/04/18 0700)   fentaNYL infusion INTRAVENOUS 125 mcg/hr (12/04/18 0700)   phenylephrine (NEO-SYNEPHRINE) Adult infusion 50 mcg/min (12/04/18 0700)   vancomycin Stopped (12/04/18 0101)     Objective: Vitals:   12/04/18 0615 12/04/18 0630 12/04/18 0645 12/04/18 0700  BP: 125/65 (!) 135/57 (!) 122/51 (!) 121/52  Pulse:    94  Resp:    (!) 24  Temp:      TempSrc:      SpO2:    93%  Weight:      Height:        Intake/Output Summary (Last 24 hours) at 12/04/2018 0728 Last data filed at 12/04/2018 0700 Gross per 24 hour  Intake 4106.74 ml  Output 2500 ml  Net 1606.74 ml   Filed Weights   11/29/18 0500 11/30/18 0500 12/01/18 0500  Weight: 115.7 kg 119.6 kg 118.3 kg     Physical Exam:  General: More alert today, follows some commands, positive acute respiratory distress Eyes: negative scleral hemorrhage, negative anisocoria, negative icterus ENT: Negative Runny nose, negative gingival bleeding, #7.5 cuffed ETT tube in place negative sign of infection or bleeding Neck:  Negative scars, masses, torticollis, lymphadenopathy, JVD Lungs: Tachypneic, clear to auscultation bilaterally without wheezes or crackles Cardiovascular: Tachycardic, without murmur gallop or rub normal S1 and S2 Abdomen: negative abdominal pain, nondistended, positive soft, bowel sounds, no rebound, no ascites, no appreciable mass Extremities: No significant cyanosis, clubbing, or edema bilateral lower extremities Skin: Negative rashes, lesions, ulcers Psychiatric: Unable to evaluate secondary to intubation/sedation   Central nervous system: Alert follows some commands    .     Data Reviewed: Care during the described time interval was provided by me .  I have reviewed this patient's available data, including medical history, events of note, physical examination, and all test results as part of my evaluation.   CBC: Recent Labs  Lab  11/30/18 0457 12/01/18 0535 12/02/18 0510 12/03/18 0500 12/04/18 0500  WBC 13.6* 11.3* 6.7 4.0 3.5*  NEUTROABS 12.5* 9.9* 5.9 3.2 2.5  HGB 10.1* 9.6* 8.4* 8.3* 8.6*  HCT 32.7* 31.9* 27.6* 28.2* 29.8*  MCV 91.1 93.0 95.2 96.9 98.3  PLT 94* 81* 65* 58* 56*   Basic Metabolic Panel: Recent Labs  Lab 11/29/18 2230  11/30/18 0457 12/01/18 0535 12/02/18 0510 12/03/18 0500 12/04/18 0500  NA 146*   < > 145 142 143 143 143  K 4.5   < > 4.3 4.2 4.2 4.4 4.3  CL 100  --  98 102 101 102 102  CO2 32  --  32 33* 35* 33* 33*  GLUCOSE 214*  --  232* 191* 181* 177* 135*  BUN 91*  --  98* 99* 85* 70* 47*  CREATININE 1.30*  --  1.43* 1.34* 1.13* 0.85 0.75  CALCIUM 7.7*  --  7.5* 7.2* 7.4* 7.5* 7.5*  MG  --   --  2.6* 2.5* 2.5* 2.5* 2.1  PHOS 3.2   --   --   --   --   --   --    < > = values in this interval not displayed.   GFR: Estimated Creatinine Clearance: 83.7 mL/min (by C-G formula based on SCr of 0.75 mg/dL). Liver Function Tests: Recent Labs  Lab 11/30/18 0457 12/01/18 0535 12/02/18 0510 12/03/18 0500 12/04/18 0500  AST 63* 88* 85* 64* 80*  ALT 39 70* 97* 100* 99*  ALKPHOS 75 82 61 58 63  BILITOT 1.1 1.1 1.3* 1.1 1.4*  PROT 4.9* 4.7* 4.7* 4.7* 4.7*  ALBUMIN 2.4* 2.3* 2.6* 2.4* 2.2*   No results for input(s): LIPASE, AMYLASE in the last 168 hours. No results for input(s): AMMONIA in the last 168 hours. Coagulation Profile: No results for input(s): INR, PROTIME in the last 168 hours. Cardiac Enzymes: No results for input(s): CKTOTAL, CKMB, CKMBINDEX, TROPONINI in the last 168 hours. BNP (last 3 results) No results for input(s): PROBNP in the last 8760 hours. HbA1C: No results for input(s): HGBA1C in the last 72 hours. CBG: Recent Labs  Lab 12/03/18 1552 12/03/18 1927 12/03/18 2242 12/03/18 2355 12/04/18 0355  GLUCAP 170* 100* 125* 147* 132*   Lipid Profile: No results for input(s): CHOL, HDL, LDLCALC, TRIG, CHOLHDL, LDLDIRECT in the last 72 hours. Thyroid Function Tests: No results for input(s): TSH, T4TOTAL, FREET4, T3FREE, THYROIDAB in the last 72 hours. Anemia Panel: No results for input(s): VITAMINB12, FOLATE, FERRITIN, TIBC, IRON, RETICCTPCT in the last 72 hours. Urine analysis:    Component Value Date/Time   COLORURINE YELLOW 11/30/2018 0400   APPEARANCEUR HAZY (A) 11/30/2018 0400   LABSPEC <1.005 (L) 11/30/2018 0400   PHURINE >9.0 (H) 11/30/2018 0400   GLUCOSEU NEGATIVE 11/30/2018 0400   HGBUR SMALL (A) 11/30/2018 0400   BILIRUBINUR NEGATIVE 11/30/2018 0400   KETONESUR NEGATIVE 11/30/2018 0400   PROTEINUR TRACE (A) 11/30/2018 0400   UROBILINOGEN 1.0 06/20/2014 0442   NITRITE NEGATIVE 11/30/2018 0400   LEUKOCYTESUR SMALL (A) 11/30/2018 0400   Sepsis  Labs: @LABRCNTIP (procalcitonin:4,lacticidven:4)  ) Recent Results (from the past 240 hour(s))  Culture, blood (routine x 2)     Status: Abnormal   Collection Time: 11/29/18 10:55 PM   Specimen: BLOOD  Result Value Ref Range Status   Specimen Description BLOOD RIGHT ANTECUBITAL  Final   Special Requests   Final    BOTTLES DRAWN AEROBIC AND ANAEROBIC Blood Culture adequate volume   Culture  Setup  Time   Final    GRAM POSITIVE COCCI IN CLUSTERS IN BOTH AEROBIC AND ANAEROBIC BOTTLES CRITICAL RESULT CALLED TO, READ BACK BY AND VERIFIED WITH: PHARMD CAREN AMEND [phone] FCP Performed at [facility], [place] [address] [place], [place] [address]    Culture METHICILLIN RESISTANT STAPHYLOCOCCUS AUREUS (A)  Final   Report Status 12/02/2018 FINAL  Final   Organism ID, Bacteria METHICILLIN RESISTANT STAPHYLOCOCCUS AUREUS  Final      Susceptibility   Methicillin resistant staphylococcus aureus - MIC*    CIPROFLOXACIN <=0.5 SENSITIVE Sensitive     ERYTHROMYCIN >=8 RESISTANT Resistant     GENTAMICIN <=0.5 SENSITIVE Sensitive     OXACILLIN >=4 RESISTANT Resistant     TETRACYCLINE <=1 SENSITIVE Sensitive     VANCOMYCIN 1 SENSITIVE Sensitive     TRIMETH/SULFA <=10 SENSITIVE Sensitive     CLINDAMYCIN <=0.25 SENSITIVE Sensitive     RIFAMPIN <=0.5 SENSITIVE Sensitive     Inducible Clindamycin NEGATIVE Sensitive     * METHICILLIN RESISTANT STAPHYLOCOCCUS AUREUS  Blood Culture ID Panel (Reflexed)     Status: Abnormal   Collection Time: 11/29/18 10:55 PM  Result Value Ref Range Status   Enterococcus species NOT DETECTED NOT DETECTED Final   Listeria monocytogenes NOT DETECTED NOT DETECTED Final   Staphylococcus species DETECTED (A) NOT DETECTED Final    Comment: CRITICAL RESULT CALLED TO, READ BACK BY AND VERIFIED WITH: PHARMD CAREN AMEND [phone] [id number] FCP    Staphylococcus aureus (BCID) DETECTED (A) NOT DETECTED Final    Comment: Methicillin (oxacillin)-resistant Staphylococcus aureus  (MRSA). MRSA is predictably resistant to beta-lactam antibiotics (except ceftaroline). Preferred therapy is vancomycin unless clinically contraindicated. Patient requires contact precautions if  hospitalized. CRITICAL RESULT CALLED TO, READ BACK BY AND VERIFIED WITH: PHARMD CAREN AMEND [phone] [id number] FCP    Methicillin resistance DETECTED (A) NOT DETECTED Final    Comment: CRITICAL RESULT CALLED TO, READ BACK BY AND VERIFIED WITH: PHARMD CAREN AMEND [id number] FCP    Streptococcus species NOT DETECTED NOT DETECTED Final   Streptococcus agalactiae NOT DETECTED NOT DETECTED Final   Streptococcus pneumoniae NOT DETECTED NOT DETECTED Final   Streptococcus pyogenes NOT DETECTED NOT DETECTED Final   Acinetobacter baumannii NOT DETECTED NOT DETECTED Final   Enterobacteriaceae species NOT DETECTED NOT DETECTED Final   Enterobacter cloacae complex NOT DETECTED NOT DETECTED Final   Escherichia coli NOT DETECTED NOT DETECTED Final   Klebsiella oxytoca NOT DETECTED NOT DETECTED Final   Klebsiella pneumoniae NOT DETECTED NOT DETECTED Final   Proteus species NOT DETECTED NOT DETECTED Final   Serratia marcescens NOT DETECTED NOT DETECTED Final   Haemophilus influenzae NOT DETECTED NOT DETECTED Final   Neisseria meningitidis NOT DETECTED NOT DETECTED Final   Pseudomonas aeruginosa NOT DETECTED NOT DETECTED Final   Candida albicans NOT DETECTED NOT DETECTED Final   Candida glabrata NOT DETECTED NOT DETECTED Final   Candida krusei NOT DETECTED NOT DETECTED Final   Candida parapsilosis NOT DETECTED NOT DETECTED Final   Candida tropicalis NOT DETECTED NOT DETECTED Final    Comment: Performed at [facility], [place]. [address], [place], [place] [address]  Culture, blood (routine x 2)     Status: Abnormal   Collection Time: 11/29/18 11:05 PM   Specimen: BLOOD RIGHT HAND  Result Value Ref Range Status   Specimen Description BLOOD RIGHT HAND  Final   Special Requests   Final    BOTTLES DRAWN AEROBIC AND  ANAEROBIC Blood Culture adequate volume  Culture  Setup Time   Final    GRAM POSITIVE COCCI IN BOTH AEROBIC AND ANAEROBIC BOTTLES CRITICAL VALUE NOTED.  VALUE IS CONSISTENT WITH PREVIOUSLY REPORTED AND CALLED VALUE.    Culture (A)  Final    STAPHYLOCOCCUS AUREUS SUSCEPTIBILITIES PERFORMED ON PREVIOUS CULTURE WITHIN THE LAST 5 DAYS. Performed at [facility], [place] [address], [place], [place] [address]    Report Status 12/02/2018 FINAL  Final  Culture, blood (Routine X 2) w Reflex to ID Panel     Status: Abnormal (Preliminary result)   Collection Time: 12/01/18  2:14 PM   Specimen: BLOOD RIGHT HAND  Result Value Ref Range Status   Specimen Description   Final    BLOOD RIGHT HAND Performed at [facility], [place] [address] [place], [place] [address]    Special Requests   Final    BOTTLES DRAWN AEROBIC AND ANAEROBIC Blood Culture adequate volume Performed at [facility] [address], [place], [place] [address]    Culture  Setup Time   Final    GRAM POSITIVE COCCI ANAEROBIC BOTTLE ONLY CRITICAL VALUE NOTED.  VALUE IS CONSISTENT WITH PREVIOUSLY REPORTED AND CALLED VALUE.    Culture (A)  Final    STAPHYLOCOCCUS AUREUS SUSCEPTIBILITIES PERFORMED ON PREVIOUS CULTURE WITHIN THE LAST 5 DAYS. Performed at [facility], [place] [address], [place], [place] [address]    Report Status PENDING  Incomplete  Culture, blood (Routine X 2) w Reflex to ID Panel     Status: None (Preliminary result)   Collection Time: 12/01/18  2:20 PM   Specimen: BLOOD  Result Value Ref Range Status   Specimen Description   Final    BLOOD RIGHT HAND Performed at [facility] [address] [place], [place] [address]    Special Requests   Final    BOTTLES DRAWN AEROBIC AND ANAEROBIC Blood Culture adequate volume Performed at [facility] [address], [place], [place] [address]    Culture  Setup Time   Final    IN BOTH AEROBIC  AND ANAEROBIC BOTTLES GRAM POSITIVE COCCI CRITICAL VALUE NOTED.  VALUE IS CONSISTENT WITH PREVIOUSLY REPORTED AND CALLED VALUE. Performed at [facility], [place] [address] [place], [place] [address]    Culture [facility] POSITIVE COCCI  Final   Report Status PENDING  Incomplete         Radiology Studies: Dg Chest Port 1 View  Result Date: 12/03/2018 CLINICAL DATA:  Pneumomediastinum. EXAM: PORTABLE CHEST 1 VIEW COMPARISON:  Three days ago FINDINGS: Very low volume chest with asymmetric right diaphragm elevation. Worsening airspace disease. Normal heart size. Endotracheal tube tip at the clavicular heads. The feeding tube at least reaches the stomach. Left IJ catheter has been removed. Soft tissue emphysema is newly seen in the left supraclavicular region. No visible pneumothorax. No visible pneumomediastinum. IMPRESSION: 1. New left supraclavicular soft tissue emphysema. No visible pneumothorax. 2. A left IJ catheter has been removed since prior. Remaining hardware in unremarkable position. 3. Worsening pneumonia.  Chronically elevated right diaphragm. Electronically Signed   By: Monte Fantasia M.D.   On: 12/03/2018 08:54   Vas [place] Lower Extremity Venous (dvt)  Result Date: 12/02/2018  Lower Venous Study Indications: Edema, and Positive for Covid-19.  Limitations: Poor ultrasound/tissue interface and body habitus. Comparison Study: No prior studies. Performing Technologist: Oda Cogan RDMS, RVT  Examination Guidelines: A complete evaluation includes B-mode imaging, spectral Doppler, color Doppler, and  power Doppler as needed of all accessible portions of each vessel. Bilateral testing is considered an integral part of a complete examination. Limited examinations for reoccurring indications may be performed as noted.  +---------+---------------+---------+-----------+----------+--------------+  RIGHT     Compressibility Phasicity Spontaneity Properties Summary          +---------+---------------+---------+-----------+----------+--------------+  CFV       Full            Yes       Yes                                    +---------+---------------+---------+-----------+----------+--------------+  SFJ       Full                                                             +---------+---------------+---------+-----------+----------+--------------+  FV Prox   Full                                                             +---------+---------------+---------+-----------+----------+--------------+  FV Mid    Full                                                             +---------+---------------+---------+-----------+----------+--------------+  FV Distal Full                                                             +---------+---------------+---------+-----------+----------+--------------+  PFV       Full                                                             +---------+---------------+---------+-----------+----------+--------------+  POP       Full            Yes       Yes                                    +---------+---------------+---------+-----------+----------+--------------+  PTV                                                        Not visualized  +---------+---------------+---------+-----------+----------+--------------+  PERO  Not visualized  +---------+---------------+---------+-----------+----------+--------------+   +---------+---------------+---------+-----------+----------+--------------+  LEFT      Compressibility Phasicity Spontaneity Properties Summary         +---------+---------------+---------+-----------+----------+--------------+  CFV       Full            Yes       Yes                                    +---------+---------------+---------+-----------+----------+--------------+  SFJ       Full                                                              +---------+---------------+---------+-----------+----------+--------------+  FV Prox   Full                                                             +---------+---------------+---------+-----------+----------+--------------+  FV Mid    Full                                                             +---------+---------------+---------+-----------+----------+--------------+  FV Distal Full                                                             +---------+---------------+---------+-----------+----------+--------------+  PFV       Full                                                             +---------+---------------+---------+-----------+----------+--------------+  POP       Full                                                             +---------+---------------+---------+-----------+----------+--------------+  PTV                                                        Not visualized  +---------+---------------+---------+-----------+----------+--------------+  PERO  Not visualized  +---------+---------------+---------+-----------+----------+--------------+     Summary: Right: There is no evidence of deep vein thrombosis in the lower extremity. However, portions of this examination were limited- see technologist comments above. No cystic structure found in the popliteal fossa. Left: No cystic structure found in the popliteal fossa.  *See table(s) above for measurements and observations. Electronically signed by Curt Jews MD on 12/02/2018 at 11:10:00 AM.    Final         Scheduled Meds:  sodium chloride   Intravenous Once   chlorhexidine  15 mL Mouth/Throat BID   clonazePAM  0.5 mg Oral BID   digoxin  0.125 mg Intravenous Daily   feeding supplement (PRO-STAT SUGAR FREE 64)  30 mL Per Tube QID   feeding supplement (VITAL AF 1.2 CAL)  1,000 mL Per Tube Q24H   fentaNYL (SUBLIMAZE) injection  50 mcg Intravenous Once   ferrous sulfate   300 mg Per Tube TID WC   free water  300 mL Per Tube Q6H   gabapentin  100 mg Per Tube Q8H   insulin aspart  0-20 Units Subcutaneous Q4H   insulin aspart  10 Units Subcutaneous Q4H   insulin detemir  50 Units Subcutaneous BID   levothyroxine  112 mcg Oral Q0600   mouth rinse  15 mL Mouth Rinse 10 times per day   metoprolol tartrate  2.5 mg Intravenous Q6H   multivitamin with minerals  1 tablet Per Tube Daily   oxyCODONE  5 mg Per Tube Q12H   pantoprazole sodium  40 mg Per Tube BID   polyethylene glycol  17 g Per Tube Daily   sodium chloride flush  10-40 mL Intracatheter Q12H   vitamin C  500 mg Per Tube Daily   zinc sulfate  220 mg Per Tube Daily   Continuous Infusions:  sodium chloride Stopped (12/01/18 1300)   amiodarone 30 mg/hr (12/04/18 0700)   fentaNYL infusion INTRAVENOUS 125 mcg/hr (12/04/18 0700)   phenylephrine (NEO-SYNEPHRINE) Adult infusion 50 mcg/min (12/04/18 0700)   vancomycin Stopped (12/04/18 0101)     LOS: 16 days   The patient is critically ill with multiple organ systems failure and requires high complexity decision making for assessment and support, frequent evaluation and titration of therapies, application of advanced monitoring technologies and extensive interpretation of multiple databases. Critical Care Time devoted to patient care services described in this note  Time spent: 40 minutes     Faryal Marxen, Geraldo Docker, MD [facility] Pager [phone]  If 7PM-7AM, please contact night-coverage [email or web address] Password [email or web address] 12/04/2018, 7:28 AM

## 2018-12-04 NOTE — Progress Notes (Signed)
PROGRESS NOTE    [name] [name]  HYI:[id number] DOB: 10/31/1950 DOA: 11/20/2018 PCP: Nicholos Johns, MD   Brief Narrative:  68 y.o.BF PMHx morbid obesity, DM type II, COPD not on oxygen, HTN, chronic atrial fibrillation CHAD2VASc~4, on Xarelto and Cardizem,spine surgeries in the past,chronic fluid retention, history of Graves' disease now hypothyroid, history of CVA, history of DVT who lives at [facility] and was likely exposed to COVID-19 infection about 10 to 14 days prior to this admission.     She was experiencing fever, body aches and developed shortness of breath.  She finally decided to come into the emergency department at [facility].  She was found to be positive for COVID-19.  Was noted to have hypoxia.  She was hospitalized for further management.    Subjective: 8/7 much more alert today (intubated/decrease sedation) follows commands.   Assessment & Plan:   Active Problems:   Essential hypertension   Acute respiratory failure with hypoxemia (HCC)   PAF (paroxysmal atrial fibrillation) (HCC)   Pneumonia due to COVID-19 virus   SOB (shortness of breath)   Ventricular tachyarrhythmia (HCC)   Intubation of airway performed without difficulty   Subcutaneous emphysema (HCC)   Pneumomediastinum (HCC)   Pressure injury of skin   MRSA bacteremia   Atrial fibrillation, chronic   Anasarca   Acute renal failure superimposed on stage 3 chronic kidney disease ([place])   Controlled type 2 diabetes mellitus with hyperglycemia (HCC)   Acute blood loss anemia   Thrombocytopenia (HCC)  Acute respiratory failure with hypoxia/COVID 19 pneumonia/ARDS -Completed course Remdesivir -Dexamethasone started 7/22 6 mg daily tapered and stopped at 14 days. -Actemra x2 doses 7/22 and 7/23 -Continue vitamins per COVID protocol -Afebrile overnight - 8/6 PCXR shows worsening pneumonia see results below - 8/7 continue weaning trial today. - Patient off Versed but now on Precedex,  will wean fentanyl, if patient tolerates.  Recent Labs  Lab 11/30/18 0457 12/01/18 0535 12/02/18 0510 12/03/18 0500 12/04/18 0500  CRP <0.8 1.5* 1.6* 1.1* 1.7*   Recent Labs  Lab 11/30/18 0457 12/01/18 0535 12/02/18 0510 12/03/18 0500 12/04/18 0500  DDIMER 15.28* 8.65* 3.81* 6.55* 17.45*    Asthma vs COPD - See acute respiratory failure  Subcutaneous emphysema/pneumomediastinum - 7/26 diagnosis subcutaneous emphysema.  CXR also suggested mediastinal emphysema.  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  Stable   MRSA bacteremia -Central/PIC line removed -Patient placed on appropriate antibiotics -Patient will receive 24 to 48-hour line holiday - Blood culture NGTD see below - 7/24 echocardiogram negative vegetation.  However patient diagnosed with MRSA bacteremia on 8/2 repeat echocardiogram most likely will be required.  Would prefer TEE.  Will need to discuss with cardiology - 8/2 repeat blood cultures positive staph aureus speciation and susceptibilities pending. -8/7 discussed case with cardiology recommend empiric coverage for possible endocarditis.  Will not perform TEE at this time as patient has COVID and procedure is an aerosol lysing procedure. -8/7 discussed case with Dr. Karolee Ohs, ID concerning restarting antibiotics.  Does not believe patient is a vancomycin failure therefore recommends restarting vancomycin for minimum of 4 weeks treatment.  Enterovesical Fistula ? -Some concern that patient may have a fistula, but I am not fully convinced patient has such fistula.  Usually patient would have a significant leukocytosis, fever etc.  But  given that she has been on immunosuppressive medication we will proceed as if fistula possible. - Obtain urinalysis  - Obtain urine culture - 8/7 discussed case with radiology they recommended CT abdomen/pelvis with oral and IV contrast. -Dependent upon findings consult GI or GYN.  Marland Kitchen  Chronic A. fib with episodes of V. tach - At home was treated with Xarelto -Amiodarone drip -Digoxin 0.125 mg daily -Metoprolol 2.5 mg QID -Metoprolol PRN - Currently rate controlled - 8/5 all anticoagulants held currently.  Discussed case with RN and patient is continuing to bleed, pure wick looked like a tampon when it was changed out.  Will reassess starting anticoagulant in next 24 to 48 hours. - Patient also thrombocytopenic if she continues to bleed will transfuse unit of platelets.  -Platelets low normal prior to admission -CHADS2VASc score~3 -8/6 bleeding has stabilized continue to hold all anticoagulants for another 24 hours will then make decision on whether to restart. 8/7 restart heparin drip, patient with d-dimer continue to increase see acute respiratory failure.  Will request pharmacy maintain low end of therapeutic level secondary to previous bleed  poor circulation. -Bilateral lower extremity cool to touch although pulses are present but faint.  Anasarca. -Appears to be third spacing with fluid. -Strict in and out +10 L -Daily weight Filed Weights   11/29/18 0500 11/30/18 0500 12/01/18 0500  Weight: 115.7 kg 119.6 kg 118.3 kg  -q4hr CVP -albumin and Lasix.  PRN  Acute on CKD stage III ?  (Cr 12/25/2016 0.90) -Unknown baseline renal function -Baseline renal function not entirely known.  Recent Labs  Lab 11/30/18 0457 12/01/18 0535 12/02/18 0510 12/03/18 0500 12/04/18 0500  CREATININE 1.43* 1.34* 1.13* 0.85 0.75  -Improved  Hypothyroidism -Continue levothyroxine.   -As noted above her TSH was noted to be low.  Free T4 is 1.0.  No further work-up.  Low TSH likely sick euthyroid.  Hx stroke  -Patient was on Xarelto and aspirin, have been switched over to IV heparin however given her  ongoing bleeding all anticoagulants on hold.   History of vitamin D deficiency Continue supplementation.  Hx DVT -Xarelto on hold secondary to bleeding    Diabetes type 2 controlled with hyperglycemia -7/22 hemoglobin A1c = 7.0  - Levemir 50 units twice daily - NovoLog 10 units Q 4 hours - Resistant SSI  Morbid obesity Body mass index is 46.2 kg/m.   Nutrition Continue tube feedings.  Constipation resolved Continue bowel regimen.  Mildly elevated AST. Monitor for now.  Acute blood loss anemia/Bleeding from oral cavity/melena -Patient had been on Xarelto and heparin all anticoagulants discontinued -Resolved  -Hemoglobin on admission was 12.6. Recent Labs  Lab 11/30/18 0457 12/01/18 0535 12/02/18 0510 12/03/18 0500 12/04/18 0500  HGB 10.1* 9.6* 8.4* 8.3* 8.6*  - Transfuse for hemoglobin<7 - Monitor closely now that heparin has been restarted.  Acute thrombocytopenia - Most likely secondary to MRSA bacteremia -Currently hemoglobin stabilized off anticoagulants will obtain HIT panel -If bleeding resumes transfuse platelets   Leukocytosis - Most likely secondary to infection - We will type and cross    DVT prophylaxis: Heparin (hold) Code Status: Partial Family Communication: Spoke with Marciano Sequin daughter; discussed plan of care answered all questions.   Disposition Plan: TBD   Consultants:  PCCM Cardiology phone consult Dr. Buford Dresser     Procedures/Significant Events:  7/23 intubation >Central line insertion 11/19/2018 >Cortrack replacement 11/25/2018 >Transthoracic echocardiogram 7/24 1. The left ventricle has hyperdynamic systolic function, with an ejection fraction of >65%.  The cavity size was normal. Left ventricular diastolic Doppler parameters are consistent with impaired relaxation. 2. The right ventricle has normal systolc function. The cavity was normal. There is no increase in right ventricular wall thickness. Right  ventricular systolic pressure is normal. 3. Mild thickening of the aortic valve. No stenosis of the aortic valve. 4. The aortic root and ascending aorta are normal in size and structure. > Limited echocardiogram 12/01/2018 no vegetation. 8/6 PCXR: New LEFT supra clavicular soft tissue emphysema, no visible pneumothorax - Worsening pneumonia, chronically elevated RIGHT diaphragm   I have personally reviewed and interpreted all radiology studies and my findings are as above.  VENTILATOR SETTINGS:    Cultures 8/4 blood RIGHT handx2 positive staph aureus 8/6 blood pending      Antimicrobials: Anti-infectives (From admission, onward)   Start     Stop   12/02/18 1400  vancomycin (VANCOCIN) 1,750 mg in sodium chloride 0.9 % 500 mL IVPB  Status:  Discontinued     12/02/18 1151   12/02/18 1300  vancomycin (VANCOCIN) IVPB 1000 mg/200 mL premix         11/30/18 1430  vancomycin (VANCOCIN) 2,500 mg in sodium chloride 0.9 % 500 mL IVPB     11/30/18 1711   11/19/18 2030  remdesivir 100 mg in sodium chloride 0.9 % 250 mL IVPB     11/22/18 2204   11/22/2018 2030  remdesivir 200 mg in sodium chloride 0.9 % 250 mL IVPB     11/06/2018 2056       Devices    LINES / TUBES:      Continuous Infusions: . sodium chloride Stopped (12/01/18 1300)  . amiodarone 30 mg/hr (12/04/18 1500)  . fentaNYL infusion INTRAVENOUS Stopped (12/04/18 0751)  . heparin 750 Units/hr (12/04/18 1500)  . phenylephrine (NEO-SYNEPHRINE) Adult infusion 18 mcg/min (12/04/18 1500)  . vancomycin Stopped (12/04/18 1314)     Objective: Vitals:   12/04/18 1330 12/04/18 1400 12/04/18 1430 12/04/18 1500  BP: (!) 85/57 (!) 97/48 92/60 (!) 104/55  Pulse: 95 95 93 95  Resp: (!) 21 (!) 21 19 19   Temp:      TempSrc:      SpO2: 96% 96% 97% 95%  Weight:      Height:        Intake/Output Summary (Last 24 hours) at 12/04/2018 1604 Last data filed at 12/04/2018 1500 Gross per 24 hour  Intake 4823.71 ml  Output 1950 ml   Net 2873.71 ml   Filed Weights   11/29/18 0500 11/30/18 0500 12/01/18 0500  Weight: 115.7 kg 119.6 kg 118.3 kg    Physical Exam:  General: More alert today, follows some commands, positive acute respiratory distress Eyes: negative scleral hemorrhage, negative anisocoria, negative icterus ENT: Negative Runny nose, negative gingival bleeding, #7.5 cuffed ETT tube in place negative sign of infection or bleeding Neck:  Negative scars, masses, torticollis, lymphadenopathy, JVD Lungs: Tachypneic, clear to auscultation bilaterally without wheezes or crackles Cardiovascular: Tachycardic, without murmur gallop or rub normal S1 and S2 Abdomen: negative abdominal pain, nondistended, positive soft, bowel sounds, no rebound, no ascites, no appreciable mass Extremities: No significant cyanosis, clubbing, or edema bilateral lower extremities Skin: Negative rashes, lesions, ulcers Psychiatric: Unable to evaluate secondary to intubation/sedation   Central nervous system: Alert follows some commands    .     Data Reviewed: Care during the described time interval was provided by me .  I have reviewed this patient's available data, including medical history, events of note,  physical examination, and all test results as part of my evaluation.   CBC: Recent Labs  Lab 11/30/18 0457 12/01/18 0535 12/02/18 0510 12/03/18 0500 12/04/18 0500  WBC 13.6* 11.3* 6.7 4.0 3.5*  NEUTROABS 12.5* 9.9* 5.9 3.2 2.5  HGB 10.1* 9.6* 8.4* 8.3* 8.6*  HCT 32.7* 31.9* 27.6* 28.2* 29.8*  MCV 91.1 93.0 95.2 96.9 98.3  PLT 94* 81* 65* 58* 56*   Basic Metabolic Panel: Recent Labs  Lab 11/29/18 2230  11/30/18 0457 12/01/18 0535 12/02/18 0510 12/03/18 0500 12/04/18 0500  NA 146*   < > 145 142 143 143 143  K 4.5   < > 4.3 4.2 4.2 4.4 4.3  CL 100  --  98 102 101 102 102  CO2 32  --  32 33* 35* 33* 33*  GLUCOSE 214*  --  232* 191* 181* 177* 135*  BUN 91*  --  98* 99* 85* 70* 47*  CREATININE 1.30*  --  1.43*  1.34* 1.13* 0.85 0.75  CALCIUM 7.7*  --  7.5* 7.2* 7.4* 7.5* 7.5*  MG  --   --  2.6* 2.5* 2.5* 2.5* 2.1  PHOS 3.2  --   --   --   --   --   --    < > = values in this interval not displayed.   GFR: Estimated Creatinine Clearance: 83.7 mL/min (by C-G formula based on SCr of 0.75 mg/dL). Liver Function Tests: Recent Labs  Lab 11/30/18 0457 12/01/18 0535 12/02/18 0510 12/03/18 0500 12/04/18 0500  AST 63* 88* 85* 64* 80*  ALT 39 70* 97* 100* 99*  ALKPHOS 75 82 61 58 63  BILITOT 1.1 1.1 1.3* 1.1 1.4*  PROT 4.9* 4.7* 4.7* 4.7* 4.7*  ALBUMIN 2.4* 2.3* 2.6* 2.4* 2.2*   No results for input(s): LIPASE, AMYLASE in the last 168 hours. No results for input(s): AMMONIA in the last 168 hours. Coagulation Profile: No results for input(s): INR, PROTIME in the last 168 hours. Cardiac Enzymes: No results for input(s): CKTOTAL, CKMB, CKMBINDEX, TROPONINI in the last 168 hours. BNP (last 3 results) No results for input(s): PROBNP in the last 8760 hours. HbA1C: No results for input(s): HGBA1C in the last 72 hours. CBG: Recent Labs  Lab 12/03/18 2242 12/03/18 2355 12/04/18 0355 12/04/18 0741 12/04/18 1521  GLUCAP 125* 147* 132* 110* 153*   Lipid Profile: No results for input(s): CHOL, HDL, LDLCALC, TRIG, CHOLHDL, LDLDIRECT in the last 72 hours. Thyroid Function Tests: No results for input(s): TSH, T4TOTAL, FREET4, T3FREE, THYROIDAB in the last 72 hours. Anemia Panel: No results for input(s): VITAMINB12, FOLATE, FERRITIN, TIBC, IRON, RETICCTPCT in the last 72 hours. Urine analysis:    Component Value Date/Time   COLORURINE YELLOW 11/30/2018 0400   APPEARANCEUR HAZY (A) 11/30/2018 0400   LABSPEC <1.005 (L) 11/30/2018 0400   PHURINE >9.0 (H) 11/30/2018 0400   GLUCOSEU NEGATIVE 11/30/2018 0400   HGBUR SMALL (A) 11/30/2018 0400   BILIRUBINUR NEGATIVE 11/30/2018 0400   KETONESUR NEGATIVE 11/30/2018 0400   PROTEINUR TRACE (A) 11/30/2018 0400   UROBILINOGEN 1.0 06/20/2014 0442    NITRITE NEGATIVE 11/30/2018 0400   LEUKOCYTESUR SMALL (A) 11/30/2018 0400   Sepsis Labs: @LABRCNTIP (procalcitonin:4,lacticidven:4)  ) Recent Results (from the past 240 hour(s))  Culture, blood (routine x 2)     Status: Abnormal   Collection Time: 11/29/18 10:55 PM   Specimen: BLOOD  Result Value Ref Range Status   Specimen Description BLOOD RIGHT ANTECUBITAL  Final   Special Requests  Final    BOTTLES DRAWN AEROBIC AND ANAEROBIC Blood Culture adequate volume   Culture  Setup Time   Final    GRAM POSITIVE COCCI IN CLUSTERS IN BOTH AEROBIC AND ANAEROBIC BOTTLES CRITICAL RESULT CALLED TO, READ BACK BY AND VERIFIED WITH: PHARMD CAREN AMEND [id number] FCP Performed at [facility], [place] [address] [place], [place] [address]    Culture METHICILLIN RESISTANT STAPHYLOCOCCUS AUREUS (A)  Final   Report Status 12/02/2018 FINAL  Final   Organism ID, Bacteria METHICILLIN RESISTANT STAPHYLOCOCCUS AUREUS  Final      Susceptibility   Methicillin resistant staphylococcus aureus - MIC*    CIPROFLOXACIN <=0.5 SENSITIVE Sensitive     ERYTHROMYCIN >=8 RESISTANT Resistant     GENTAMICIN <=0.5 SENSITIVE Sensitive     OXACILLIN >=4 RESISTANT Resistant     TETRACYCLINE <=1 SENSITIVE Sensitive     VANCOMYCIN 1 SENSITIVE Sensitive     TRIMETH/SULFA <=10 SENSITIVE Sensitive     CLINDAMYCIN <=0.25 SENSITIVE Sensitive     RIFAMPIN <=0.5 SENSITIVE Sensitive     Inducible Clindamycin NEGATIVE Sensitive     * METHICILLIN RESISTANT STAPHYLOCOCCUS AUREUS  Blood Culture ID Panel (Reflexed)     Status: Abnormal   Collection Time: 11/29/18 10:55 PM  Result Value Ref Range Status   Enterococcus species NOT DETECTED NOT DETECTED Final   Listeria monocytogenes NOT DETECTED NOT DETECTED Final   Staphylococcus species DETECTED (A) NOT DETECTED Final    Comment: CRITICAL RESULT CALLED TO, READ BACK BY AND VERIFIED WITH: PHARMD CAREN AMEND [id number] FCP    Staphylococcus aureus (BCID) DETECTED (A) NOT  DETECTED Final    Comment: Methicillin (oxacillin)-resistant Staphylococcus aureus (MRSA). MRSA is predictably resistant to beta-lactam antibiotics (except ceftaroline). Preferred therapy is vancomycin unless clinically contraindicated. Patient requires contact precautions if  hospitalized. CRITICAL RESULT CALLED TO, READ BACK BY AND VERIFIED WITH: PHARMD CAREN AMEND [id number] FCP    Methicillin resistance DETECTED (A) NOT DETECTED Final    Comment: CRITICAL RESULT CALLED TO, READ BACK BY AND VERIFIED WITH: PHARMD CAREN AMEND [id number] FCP    Streptococcus species NOT DETECTED NOT DETECTED Final   Streptococcus agalactiae NOT DETECTED NOT DETECTED Final   Streptococcus pneumoniae NOT DETECTED NOT DETECTED Final   Streptococcus pyogenes NOT DETECTED NOT DETECTED Final   Acinetobacter baumannii NOT DETECTED NOT DETECTED Final   Enterobacteriaceae species NOT DETECTED NOT DETECTED Final   Enterobacter cloacae complex NOT DETECTED NOT DETECTED Final   Escherichia coli NOT DETECTED NOT DETECTED Final   Klebsiella oxytoca NOT DETECTED NOT DETECTED Final   Klebsiella pneumoniae NOT DETECTED NOT DETECTED Final   Proteus species NOT DETECTED NOT DETECTED Final   Serratia marcescens NOT DETECTED NOT DETECTED Final   Haemophilus influenzae NOT DETECTED NOT DETECTED Final   Neisseria meningitidis NOT DETECTED NOT DETECTED Final   Pseudomonas aeruginosa NOT DETECTED NOT DETECTED Final   Candida albicans NOT DETECTED NOT DETECTED Final   Candida glabrata NOT DETECTED NOT DETECTED Final   Candida krusei NOT DETECTED NOT DETECTED Final   Candida parapsilosis NOT DETECTED NOT DETECTED Final   Candida tropicalis NOT DETECTED NOT DETECTED Final    Comment: Performed at [facility], [place]. [address], [place], [place] [address]  Culture, blood (routine x 2)     Status: Abnormal   Collection Time: 11/29/18 11:05 PM   Specimen: BLOOD RIGHT HAND  Result Value Ref Range Status   Specimen  Description BLOOD RIGHT HAND  Final  Special Requests   Final    BOTTLES DRAWN AEROBIC AND ANAEROBIC Blood Culture adequate volume   Culture  Setup Time   Final    GRAM POSITIVE COCCI IN BOTH AEROBIC AND ANAEROBIC BOTTLES CRITICAL VALUE NOTED.  VALUE IS CONSISTENT WITH PREVIOUSLY REPORTED AND CALLED VALUE.    Culture (A)  Final    STAPHYLOCOCCUS AUREUS SUSCEPTIBILITIES PERFORMED ON PREVIOUS CULTURE WITHIN THE LAST 5 DAYS. Performed at [facility], [place] [address], [place], [place] [address]    Report Status 12/02/2018 FINAL  Final  Culture, blood (Routine X 2) w Reflex to ID Panel     Status: Abnormal   Collection Time: 12/01/18  2:14 PM   Specimen: BLOOD RIGHT HAND  Result Value Ref Range Status   Specimen Description   Final    BLOOD RIGHT HAND Performed at [facility], [place] [address] [place] [address], [place], [place] [address]    Special Requests   Final    BOTTLES DRAWN AEROBIC AND ANAEROBIC Blood Culture adequate volume Performed at [place] [address], [place], [place] [address]    Culture  Setup Time   Final    GRAM POSITIVE COCCI ANAEROBIC BOTTLE ONLY CRITICAL VALUE NOTED.  VALUE IS CONSISTENT WITH PREVIOUSLY REPORTED AND CALLED VALUE.    Culture (A)  Final    STAPHYLOCOCCUS AUREUS SUSCEPTIBILITIES PERFORMED ON PREVIOUS CULTURE WITHIN THE LAST 5 DAYS. Performed at [facility], [place] [address] [place], [place] [address]    Report Status 12/04/2018 FINAL  Final  Culture, blood (Routine X 2) w Reflex to ID Panel     Status: Abnormal   Collection Time: 12/01/18  2:20 PM   Specimen: BLOOD  Result Value Ref Range Status   Specimen Description   Final    BLOOD RIGHT HAND Performed at [place] [address], [place], [place] [address]    Special Requests   Final    BOTTLES DRAWN AEROBIC AND ANAEROBIC Blood Culture adequate volume Performed at [place] [address], [place], [place]  [address]    Culture  Setup Time   Final    IN BOTH AEROBIC AND ANAEROBIC BOTTLES GRAM POSITIVE COCCI CRITICAL VALUE NOTED.  VALUE IS CONSISTENT WITH PREVIOUSLY REPORTED AND CALLED VALUE.    Culture (A)  Final    STAPHYLOCOCCUS AUREUS SUSCEPTIBILITIES PERFORMED ON PREVIOUS CULTURE WITHIN THE LAST 5 DAYS. Performed at [facility], [place] [address], [place], [place] [address]    Report Status 12/04/2018 FINAL  Final  Culture, blood (Routine X 2) w Reflex to ID Panel     Status: None (Preliminary result)   Collection Time: 12/03/18  4:16 PM   Specimen: BLOOD  Result Value Ref Range Status   Specimen Description   Final    BLOOD RIGHT ARM Performed at [place] [address], [place], [place] [address]    Special Requests   Final    BOTTLES DRAWN AEROBIC AND ANAEROBIC Blood Culture adequate volume Performed at [place] [address] [place], [place] [address]    Culture   Final    NO GROWTH < 12 HOURS Performed at [place] [address] [place], [place] [address]    Report Status PENDING  Incomplete  Culture, blood (Routine X 2) w Reflex to ID Panel     Status: None (Preliminary result)   Collection Time: 12/03/18  4:29 PM  Specimen: BLOOD  Result Value Ref Range Status   Specimen Description   Final    BLOOD RIGHT ARM Performed at [place] [address], [place], [place] [address]    Special Requests   Final    BOTTLES DRAWN AEROBIC AND ANAEROBIC Blood Culture adequate volume Performed at [place] [address], [place], [place] [address]    Culture   Final    NO GROWTH < 12 HOURS Performed at [place] [address], [place], [place] [address]    Report Status PENDING  Incomplete         Radiology Studies: Dg Chest Port 1 View  Result Date: 12/03/2018 CLINICAL DATA:  Pneumomediastinum. EXAM: PORTABLE CHEST 1 VIEW COMPARISON:  Three days ago  FINDINGS: Very low volume chest with asymmetric right diaphragm elevation. Worsening airspace disease. Normal heart size. Endotracheal tube tip at the clavicular heads. The feeding tube at least reaches the stomach. Left IJ catheter has been removed. Soft tissue emphysema is newly seen in the left supraclavicular region. No visible pneumothorax. No visible pneumomediastinum. IMPRESSION: 1. New left supraclavicular soft tissue emphysema. No visible pneumothorax. 2. A left IJ catheter has been removed since prior. Remaining hardware in unremarkable position. 3. Worsening pneumonia.  Chronically elevated right diaphragm. Electronically Signed   By: Monte Fantasia M.D.   On: 12/03/2018 08:54        Scheduled Meds: . sodium chloride   Intravenous Once  . chlorhexidine  15 mL Mouth/Throat BID  . clonazePAM  0.5 mg Per Tube BID  . digoxin  0.125 mg Intravenous Daily  . feeding supplement (PRO-STAT SUGAR FREE 64)  30 mL Per Tube QID  . feeding supplement (VITAL AF 1.2 CAL)  1,000 mL Per Tube Q24H  . fentaNYL (SUBLIMAZE) injection  50 mcg Intravenous Once  . ferrous sulfate  300 mg Per Tube TID WC  . free water  300 mL Per Tube Q6H  . gabapentin  100 mg Per Tube Q8H  . insulin aspart  0-20 Units Subcutaneous Q4H  . insulin aspart  10 Units Subcutaneous Q4H  . insulin detemir  50 Units Subcutaneous BID  . [START ON 12/05/2018] levothyroxine  112 mcg Per Tube Q0600  . mouth rinse  15 mL Mouth Rinse 10 times per day  . metoprolol tartrate  2.5 mg Intravenous Q6H  . multivitamin with minerals  1 tablet Per Tube Daily  . oxyCODONE  5 mg Per Tube Q12H  . pantoprazole sodium  40 mg Per Tube BID  . polyethylene glycol  17 g Per Tube Daily  . sodium chloride flush  10-40 mL Intracatheter Q12H  . vitamin C  500 mg Per Tube Daily  . zinc sulfate  220 mg Per Tube Daily   Continuous Infusions: . sodium chloride Stopped (12/01/18 1300)  . amiodarone 30 mg/hr (12/04/18 1500)  . fentaNYL infusion  INTRAVENOUS Stopped (12/04/18 0751)  . heparin 750 Units/hr (12/04/18 1500)  . phenylephrine (NEO-SYNEPHRINE) Adult infusion 18 mcg/min (12/04/18 1500)  . vancomycin Stopped (12/04/18 1314)     LOS: 16 days   The patient is critically ill with multiple organ systems failure and requires high complexity decision making for assessment and support, frequent evaluation and titration of therapies, application of advanced monitoring technologies and extensive interpretation of multiple databases. Critical Care Time devoted to patient care services described in this note  Time spent: 40 minutes     Fahad Cisse, Geraldo Docker, MD [facility] Pager [phone]  If 7PM-7AM, please contact night-coverage [email or web address] Password [email or web address] 12/04/2018, 4:04 PM

## 2018-12-04 NOTE — Progress Notes (Signed)
Patient transported to CT and back to ICU on current vent settings with FiO2 100%.  Patient tolerated transport well; no complications noted.  Will continue to monitor.

## 2018-12-04 NOTE — Progress Notes (Signed)
Attending cardiology review of transesophageal echo request:  Due to the spread of COVID-19, departmental policy is to review orders for procedures and determine appropriateness regarding testing at this time. The following criteria are being used to limit potential exposure and spread of infection.  Is the patient being evaluated for COVID-19 infection: YES, INTUBATED WITH ACTIVE COVID DIAGNOSIS  Would the test change management of the patient: YES, OUTPATIENT DURATION OF ANTIBIOTICS. WOULD NOT CHANGE CURRENT INPATIENT MANAGEMENT (see below).  Can the test be performed at a later time: YES  Based on the review above, this study is not to be performed at this time. I spoke with Dr. Sherral Hammers. Patient is intubated, has MRSA bacteremia. I discussed with Dr. Sherral Hammers that TEE is aerosolizing procedure, and even with intubation there is risk of droplet propagation. Two reasons for TEE would be if she is a CT surgery candidate for large/embolizing vegetations (which on discussion, there is no evidence for) or prior to discharge for outpatient management of antibiotics. As she is intubated and is unlikely to be discharged in the near future, we recommend continued empiric IV antibiotics (specifics per ID) for coverage of possible endocarditis. As she clinically improves and nears discharge, please reach out to the cardiology service again. Based on departmental policies, COVID status, and clinical scenario, we would be happy to re evaluate at that time.  Recommendations discussed with Dr. Sherral Hammers over the phone.  Buford Dresser, MD, PhD [facility]  [address], [place], [place] [address] [phone]

## 2018-12-05 ENCOUNTER — Inpatient Hospital Stay (HOSPITAL_COMMUNITY): Payer: Medicare Other

## 2018-12-05 DIAGNOSIS — R531 Weakness: Secondary | ICD-10-CM

## 2018-12-05 LAB — VANCOMYCIN, TROUGH: Vancomycin Tr: 27 ug/mL (ref 15–20)

## 2018-12-05 LAB — GLUCOSE, CAPILLARY
Glucose-Capillary: 112 mg/dL — ABNORMAL HIGH (ref 70–99)
Glucose-Capillary: 87 mg/dL (ref 70–99)
Glucose-Capillary: 120 mg/dL — ABNORMAL HIGH (ref 70–99)
Glucose-Capillary: 146 mg/dL — ABNORMAL HIGH (ref 70–99)
Glucose-Capillary: 153 mg/dL — ABNORMAL HIGH (ref 70–99)
Glucose-Capillary: 105 mg/dL — ABNORMAL HIGH (ref 70–99)

## 2018-12-05 LAB — CBC WITH DIFFERENTIAL/PLATELET
Abs Immature Granulocytes: 0.03 10*3/uL (ref 0.00–0.07)
Basophils Absolute: 0 10*3/uL (ref 0.0–0.1)
Basophils Relative: 0 %
Eosinophils Absolute: 0.2 10*3/uL (ref 0.0–0.5)
Eosinophils Relative: 6 %
HCT: 28.7 % — ABNORMAL LOW (ref 36.0–46.0)
Hemoglobin: 8.4 g/dL — ABNORMAL LOW (ref 12.0–15.0)
Immature Granulocytes: 1 %
Lymphocytes Relative: 25 %
Lymphs Abs: 0.8 10*3/uL (ref 0.7–4.0)
MCH: 28.8 pg (ref 26.0–34.0)
MCHC: 29.3 g/dL — ABNORMAL LOW (ref 30.0–36.0)
MCV: 98.3 fL (ref 80.0–100.0)
Monocytes Absolute: 0.1 10*3/uL (ref 0.1–1.0)
Monocytes Relative: 3 %
Neutro Abs: 2.2 10*3/uL (ref 1.7–7.7)
Neutrophils Relative %: 65 %
Platelets: 55 10*3/uL — ABNORMAL LOW (ref 150–400)
RBC: 2.92 MIL/uL — ABNORMAL LOW (ref 3.87–5.11)
RDW: 23.9 % — ABNORMAL HIGH (ref 11.5–15.5)
WBC: 3.3 10*3/uL — ABNORMAL LOW (ref 4.0–10.5)
nRBC: 8.5 % — ABNORMAL HIGH (ref 0.0–0.2)

## 2018-12-05 LAB — COMPREHENSIVE METABOLIC PANEL
ALT: 112 U/L — ABNORMAL HIGH (ref 0–44)
AST: 113 U/L — ABNORMAL HIGH (ref 15–41)
Albumin: 2.1 g/dL — ABNORMAL LOW (ref 3.5–5.0)
Alkaline Phosphatase: 66 U/L (ref 38–126)
Anion gap: 8 (ref 5–15)
BUN: 40 mg/dL — ABNORMAL HIGH (ref 8–23)
CO2: 30 mmol/L (ref 22–32)
Calcium: 7.4 mg/dL — ABNORMAL LOW (ref 8.9–10.3)
Chloride: 103 mmol/L (ref 98–111)
Creatinine, Ser: 0.69 mg/dL (ref 0.44–1.00)
GFR calc Af Amer: >60 mL/min (ref >60)
GFR calc non Af Amer: >60 mL/min (ref >60)
Glucose, Bld: 114 mg/dL — ABNORMAL HIGH (ref 70–99)
Potassium: 4.5 mmol/L (ref 3.5–5.1)
Sodium: 141 mmol/L (ref 135–145)
Total Bilirubin: 1.5 mg/dL — ABNORMAL HIGH (ref 0.3–1.2)
Total Protein: 4.6 g/dL — ABNORMAL LOW (ref 6.5–8.1)

## 2018-12-05 LAB — TSH: TSH: 6.174 u[IU]/mL — ABNORMAL HIGH (ref 0.350–4.500)

## 2018-12-05 LAB — HEPARIN LEVEL (UNFRACTIONATED)
Heparin Unfractionated: <0.1 IU/mL — ABNORMAL LOW (ref 0.30–0.70)
Heparin Unfractionated: 0.21 IU/mL — ABNORMAL LOW (ref 0.30–0.70)

## 2018-12-05 LAB — CK: Total CK: 2215 U/L — ABNORMAL HIGH (ref 38–234)

## 2018-12-05 LAB — MAGNESIUM: Magnesium: 2 mg/dL (ref 1.7–2.4)

## 2018-12-05 LAB — D-DIMER, QUANTITATIVE: D-Dimer, Quant: 18.07 ug/mL-FEU — ABNORMAL HIGH (ref 0.00–0.50)

## 2018-12-05 LAB — C-REACTIVE PROTEIN: CRP: 6.1 mg/dL — ABNORMAL HIGH (ref <1.0)

## 2018-12-05 MED ORDER — ETOMIDATE 2 MG/ML IV SOLN: 40.0000 mg | Freq: Once | INTRAVENOUS | Status: DC

## 2018-12-05 MED ORDER — PROPOFOL 10 MG/ML IV BOLUS: 500.0000 mg | Freq: Once | INTRAVENOUS | Status: DC

## 2018-12-05 MED ORDER — MIDAZOLAM HCL 2 MG/2ML IJ SOLN: 5.0000 mg | Freq: Once | INTRAMUSCULAR | Status: DC

## 2018-12-05 MED ORDER — VANCOMYCIN HCL 10 G IV SOLR
1250.0000 mg | INTRAVENOUS | Status: DC
  Filled 2018-12-05: qty 1250

## 2018-12-05 MED ORDER — OXYCODONE HCL 5 MG PO TABS
5.0000 mg | ORAL_TABLET | Freq: Two times a day (BID) | ORAL | Status: DC | PRN
  Administered 2018-12-09: 14:00:00 5 mg via ORAL
  Filled 2018-12-05: qty 1

## 2018-12-05 MED ORDER — SODIUM CHLORIDE 0.9% IV SOLUTION
Freq: Once | INTRAVENOUS | Status: AC
  Administered 2018-12-06: 09:00:00 via INTRAVENOUS

## 2018-12-05 MED ORDER — VECURONIUM BROMIDE 10 MG IV SOLR: 10.0000 mg | Freq: Once | INTRAVENOUS | Status: DC

## 2018-12-05 MED ORDER — SODIUM CHLORIDE 0.9 % IV SOLN
8.0000 mg/kg | Freq: Every day | INTRAVENOUS | Status: DC
  Administered 2018-12-05 – 2018-12-07 (×3): 948 mg via INTRAVENOUS
  Filled 2018-12-05 (×5): qty 18.96

## 2018-12-05 MED ORDER — FENTANYL CITRATE (PF) 100 MCG/2ML IJ SOLN: 200.0000 ug | Freq: Once | INTRAMUSCULAR | Status: DC

## 2018-12-05 MED ORDER — CLONAZEPAM 0.5 MG PO TABS
0.5000 mg | ORAL_TABLET | Freq: Every day | ORAL | Status: AC
  Administered 2018-12-06 – 2018-12-07 (×2): 0.5 mg
  Filled 2018-12-05 (×2): qty 1

## 2018-12-05 MED ORDER — DEXTROSE 50 % IV SOLN
12.5000 g | Freq: Once | INTRAVENOUS | Status: AC
  Administered 2018-12-05: 12.5 g via INTRAVENOUS

## 2018-12-05 MED ORDER — FUROSEMIDE 10 MG/ML IJ SOLN
60.0000 mg | Freq: Once | INTRAMUSCULAR | Status: AC
  Administered 2018-12-05: 60 mg via INTRAVENOUS
  Filled 2018-12-05: qty 6

## 2018-12-05 MED ORDER — DEXTROSE 50 % IV SOLN
INTRAVENOUS | Status: AC
  Filled 2018-12-05: qty 50

## 2018-12-05 MED ORDER — ALBUMIN HUMAN 25 % IV SOLN
25.0000 g | Freq: Once | INTRAVENOUS | Status: AC
  Administered 2018-12-05: 25 g via INTRAVENOUS
  Filled 2018-12-05: qty 50

## 2018-12-05 NOTE — Progress Notes (Signed)
NAME:  [name] [name], MRN:  [id number], DOB:  Jul 31, 1950, LOS: 60 ADMISSION DATE:  11/13/2018, CONSULTATION DATE:  July 23 REFERRING MD:  Candiss Norse, CHIEF COMPLAINT:  dyspnea   Brief History   68 year old female admitted for severe acute respiratory failure with hypoxemia due to COVID-19 pneumonia  Past Medical History  Diabetes mellitus type 2 Obesity Asthma, has been on inhalers in the past, never smoked cigarettes CKD stage III Chronic A. fib History of spine surgery History of Graves' disease, now hypothyroid History of stroke History of DVT Lives in an assisted living facility [facility] Events   July 22 admitted July 23 intubated July 24 prone positioning July 26 subcutaneous air, no pneumothorax, afib with RVR again July 29 decreasing sedation, diuresis July 30 decreasing sedation July 31 holding additional diuresis 8/2 weaning on pressure support 8/5 vent settings down to FiO2 45% and PEEP 5  Consults:  PCCM  Procedures:  7/23 ETT > 7/23 L IJ CVL >8/3 8/3 midline/PIV>   Significant Diagnostic Tests:  7/24 Echo > LVEF > 65%, impaired relaxation, RV normal systolic function, mild thickening of aortic valve 8/4 repeat Echo> LVEF normal > 65%, no clear endocarditis  Micro Data:  July 22 SARS COV 2 Positive 8/2 blood > MRSA 8/4 blood > MRSA 8/6 blood > pending  Antimicrobials:  July 22 remdesivir> 7/26 July 22 decadron July 22> 23 Toclizumab   Interim history/subjective:   Tolerating PS. Follows orofacial commands. Unable to move extremities x 4 however has intact touch sensation however impaired pain sensation  Objective   Blood pressure 134/65, pulse 91, temperature 99.1 F (37.3 C), temperature source Oral, resp. rate (!) 27, height 5\' 3"  (1.6 m), weight 118.5 kg, SpO2 97 %.    Vent Mode: CPAP;PSV FiO2 (%):  [50 %-60 %] 50 % Set Rate:  [24 bmp] 24 bmp PEEP:  [5 cmH20-8 cmH20] 8 cmH20 Pressure Support:  [10 cmH20] 10 cmH20  Plateau Pressure:  [11 cmH20-22 cmH20] 11 cmH20   Intake/Output Summary (Last 24 hours) at 12/05/2018 1247 Last data filed at 12/05/2018 0800 Gross per 24 hour  Intake 2760.69 ml  Output 1250 ml  Net 1510.69 ml   Filed Weights   11/30/18 0500 12/01/18 0500 12/05/18 0500  Weight: 119.6 kg 118.3 kg 118.5 kg    Physical Exam: General: Obese female, vented, awake and alert HENT: [place], AT, ETT in place Eyes: EOMI, no scleral icterus Respiratory: Clear to auscultation bilaterally.  No crackles, wheezing or rales Cardiovascular: RRR, -M/R/G, no JVD GI: BS+, soft, nontender Extremities:-Edema,-tenderness Neuro: Follows commands including sticking tongue out, closing eyes and nodding appropriately. Absent pain sensation but intact light touch sensation. Unable to move extremities x 4 Skin: Intact, no rashes or bruising GU: Foley in place  8/8 Improved subcuteanous emphysema. Diffuse bilateral airspace disease  ABG    Component Value Date/Time   PHART 7.480 (H) 11/30/2018 0227   PCO2ART 46.4 11/30/2018 0227   PO2ART 57.0 (L) 11/30/2018 0227   HCO3 34.4 (H) 11/30/2018 0227   TCO2 36 (H) 11/30/2018 0227   ACIDBASEDEF 3.0 (H) 11/21/2018 0628   O2SAT 90.0 11/30/2018 0227     Resolved Hospital Problem list    Assessment & Plan:  Acute respiratory failure with hypoxemia: ARDS due to novel coronavirus infection PS as tolerated When on full vent support via ARDsnet protocol Titrate PEEP and FiO2 for goal SpO2 88-95% or pO2 55-80 Pulmonary hygiene including bronchodilator and flutter valve. AVOID manual percussion. Mobilize as  tolerated Due to her weakness, will pursue tracheostomy if family is willing  Weakness: ICU-acquired weakness vs neurological process Consult Neurology PT evaluation PROM q2h  Pneumomediastinum: stable on 8/3 CXR, 8/7 interval development of SQ air - improved Continue to clinically monitor with prn CXR On minimal PEEP  MRSA bacteremia: Lines removed, last +  BCx on 8/4, ?enterovsiculofistula F/u repeat blood cultures on 8/6. CT unable to rule out fistula Change Vanc to Daptomycin per ID for empiric coverage of possible endocarditis Appreciate ID input Cardiology evaluated patient for TEE request. Stated TEE would assist in outpatient management of antibiotics but would not change inpatient management so TEE request was declined.  Ventricular tachycardia as inpatient Atrial fib with RVR  Tele  Continue amio gtt. Plan to transition to PO tomorrow if stable Continue metoprolol and digoxin On heparin gtt  Need for sedation for mechanical ventilation/dyssynchrony PRN oxycodone Wean benzo  Hypotension: sedation-related vs sepsis Wean Neo for target MAP >65  Best practice:  Diet: Tube feeding Pain/Anxiety/Delirium protocol (if indicated): as above VAP protocol (if indicated): yes DVT prophylaxis: heparin infusion GI prophylaxis: Pantoprazole for stress ulcer prophylaxis Glucose control: SSI Mobility: bed rest Code Status: limited code Family Communication: will discuss with TR[facility] Disposition: remain in ICU  Labs   CBC: Recent Labs  Lab 12/01/18 0535 12/02/18 0510 12/03/18 0500 12/04/18 0500 12/05/18 0500  WBC 11.3* 6.7 4.0 3.5* 3.3*  NEUTROABS 9.9* 5.9 3.2 2.5 2.2  HGB 9.6* 8.4* 8.3* 8.6* 8.4*  HCT 31.9* 27.6* 28.2* 29.8* 28.7*  MCV 93.0 95.2 96.9 98.3 98.3  PLT 81* 65* 58* 56* 55*    Basic Metabolic Panel: Recent Labs  Lab 11/29/18 2230  12/01/18 0535 12/02/18 0510 12/03/18 0500 12/04/18 0500 12/05/18 0500  NA 146*   < > 142 143 143 143 141  K 4.5   < > 4.2 4.2 4.4 4.3 4.5  CL 100   < > 102 101 102 102 103  CO2 32   < > 33* 35* 33* 33* 30  GLUCOSE 214*   < > 191* 181* 177* 135* 114*  BUN 91*   < > 99* 85* 70* 47* 40*  CREATININE 1.30*   < > 1.34* 1.13* 0.85 0.75 0.69  CALCIUM 7.7*   < > 7.2* 7.4* 7.5* 7.5* 7.4*  MG  --    < > 2.5* 2.5* 2.5* 2.1 2.0  PHOS 3.2  --   --   --   --   --   --    < > = values in this  interval not displayed.   GFR: Estimated Creatinine Clearance: 83.7 mL/min (by C-G formula based on SCr of 0.69 mg/dL). Recent Labs  Lab 11/29/18 2255  12/02/18 0510 12/03/18 0500 12/04/18 0500 12/05/18 0500  PROCALCITON 0.26  --   --   --   --   --   WBC  --    < > 6.7 4.0 3.5* 3.3*   < > = values in this interval not displayed.    Liver Function Tests: Recent Labs  Lab 12/01/18 0535 12/02/18 0510 12/03/18 0500 12/04/18 0500 12/05/18 0500  AST 88* 85* 64* 80* 113*  ALT 70* 97* 100* 99* 112*  ALKPHOS 82 61 58 63 66  BILITOT 1.1 1.3* 1.1 1.4* 1.5*  PROT 4.7* 4.7* 4.7* 4.7* 4.6*  ALBUMIN 2.3* 2.6* 2.4* 2.2* 2.1*   No results for input(s): LIPASE, AMYLASE in the last 168 hours. No results for input(s): AMMONIA in the last 168  hours.  ABG    Component Value Date/Time   PHART 7.480 (H) 11/30/2018 0227   PCO2ART 46.4 11/30/2018 0227   PO2ART 57.0 (L) 11/30/2018 0227   HCO3 34.4 (H) 11/30/2018 0227   TCO2 36 (H) 11/30/2018 0227   ACIDBASEDEF 3.0 (H) 11/21/2018 0628   O2SAT 90.0 11/30/2018 0227     Coagulation Profile: No results for input(s): INR, PROTIME in the last 168 hours.  Cardiac Enzymes: No results for input(s): CKTOTAL, CKMB, CKMBINDEX, TROPONINI in the last 168 hours.  HbA1C: Hgb A1c MFr Bld  Date/Time Value Ref Range Status  11/15/2018 08:00 PM 7.0 (H) 4.8 - 5.6 % Final    Comment:    (NOTE) Pre diabetes:          5.7%-6.4% Diabetes:              >6.4% Glycemic control for   <7.0% adults with diabetes   10/18/2016 10:47 AM 6.0 (H) 4.8 - 5.6 % Final    Comment:    (NOTE)         Pre-diabetes: 5.7 - 6.4         Diabetes: >6.4         Glycemic control for adults with diabetes: <7.0     CBG: Recent Labs  Lab 12/04/18 2259 12/05/18 0422 12/05/18 0753 12/05/18 0929 12/05/18 1202  GLUCAP 84 112* 87 120* 146*    Critical care time: 34 minutes    The patient is critically ill with multiple organ systems failure and requires high  complexity decision making for assessment and support, frequent evaluation and titration of therapies, application of advanced monitoring technologies and extensive interpretation of multiple databases.   Discussed and co-managed patient care with PCCM-Hospitalist. Coordinated care with RT, RN and pharmacist.  Rodman Pickle, M.D. [facility] Pulmonary/Critical Care Medicine 12/05/2018 12:47 PM  Pager: [phone] After hours pager: [phone]

## 2018-12-05 NOTE — Progress Notes (Signed)
ANTICOAGULATION CONSULT NOTE - Follow Up Consult  Pharmacy Consult for Heparin Indication: atrial fibrillation  (Hx DVT on PTA Xarelto)  Allergies  Allergen Reactions  . Sulfonamide Derivatives Itching    Patient Measurements: Height: 5\' 3"  (160 cm) Weight: 261 lb 3.9 oz (118.5 kg) IBW/kg (Calculated) : 52.4 Heparin Dosing Weight: 80.5 kg  Vital Signs: Temp: 99.1 F (37.3 C) (08/08 0748) Temp Source: Oral (08/08 0748) BP: 106/60 (08/08 0700) Pulse Rate: 87 (08/08 0700)  Labs: Recent Labs    12/03/18 0500 12/04/18 0500 12/04/18 1650 12/05/18 0500  HGB 8.3* 8.6*  --  8.4*  HCT 28.2* 29.8*  --  28.7*  PLT 58* 56*  --  55*  HEPARINUNFRC <0.10*  --  0.12* <0.10*  CREATININE 0.85 0.75  --  0.69    Estimated Creatinine Clearance: 83.7 mL/min (by C-G formula based on SCr of 0.69 mg/dL).   Assessment: 2 yoF admitted on 7/22 with PMH of afib and DVT on chronic Xarelto.  She was transitioned to Heparin IV for possible chest tube (not needed), and resume Xarelto on 7/29.  She then had black stools on 7/30 and Xarelto was d/c (last dose on 7/29 at 1400). Heparin was started on 8/1, however it was placed on hold on 8/5 due to blood seen from pure wick. H/H is low but stable now, Plt down to 56k in the setting of MRSA bacteremia. Pt has low 4T score (2-3) with time course not generally consistent with HIT. Low plts may be due to Sepsis induced DIC. D-dimer has increased from 6.55 to 7.45. Will not bolus given recent bleeding episodes and low platelet counts. Pharmacy is now consulted to resume Heparin IV.   Repeat HL this AM is now undetectable. Plt remain low at 55k. H/H low stable. No issues with bleeding per RN   Goal of Therapy:  Heparin level 0.3-0.7 units/ml Monitor platelets by anticoagulation protocol: Yes   Plan:  Increase heparin to 1100 units/hr 6 hr HL recheck Daily heparin level and CBC Monitor platelet trend closely   Albertina Parr, PharmD., BCPS Clinical  Pharmacist Clinical phone for 12/05/18 until 5pm: [phone]

## 2018-12-05 NOTE — Progress Notes (Signed)
ID PROGRESS NOTE   68 yo F with severe covid disease with respiratory failure s/p vent. Complicated by MRSA bacteremia s/p line removal. Has had bacteremia despite line removal and appropriate abtx. Possibly not at steady state with abtx however unable to get TEE to rule out endocarditis. TTE did not see vegetation.   Blood work shows worsening thrombocytopenia and WBC trending down significantly in the last 5 days. Concern for vancomycin related drug effect   - recommend to change vancomycin to daptomycin 8mg /kg/day - please get baseline CK - repeat blood cx are pending to document clearance of bacteremia - plan to treat for 4-6 wk. We wil see if can talk to cardiology to doing TEE after 21 days of treatment.

## 2018-12-05 NOTE — Consult Note (Signed)
Neurology Consultation Reason for Consult: Weakness Referring Physician: Sherral Hammers, C  CC: Weakness  History is obtained from: Family, referring physicians  HPI: [name] [name] is a 68 y.o. female admitted to [facility] with COVID and respiratory failure who was treated with steroids and has had prolonged ventilation.  She was initially admitted on 7/22 and intubated on 7/23.  She was prone on July 24 and they began weaning sedation on July 30.  Since she has become more responsive, it is now become evident that she has severe weakness.  She also has been noticed to have disconjugate gaze which after I called her daughter-in-law, is apparently not baseline.  The patient does seem to relatively reliably shake and nod her head, but I am not 235% certain of the reliability of her answers.    ROS: Unable to obtain due to altered mental status.   Past Medical History:  Diagnosis Date  . Arthritis   . Carpal tunnel syndrome   . Cataract   . Depression   . Esophagitis   . GERD (gastroesophageal reflux disease)   . Graves disease   . History of DVT (deep vein thrombosis)   . Hypertension   . Hypothyroidism   . MI (myocardial infarction) ([place])   . Mood disorder ([place])   . Peptic ulcer disease   . Pre-diabetes   . Stroke ([place])   . Thyroid disease   . Venous insufficiency   . Vitamin D deficiency      Family History  Problem Relation Age of Onset  . Hypertension Mother   . Hypertension Father   . Colon cancer Neg Hx   . Esophageal cancer Neg Hx   . Rectal cancer Neg Hx   . Stomach cancer Neg Hx      Social History:  reports that she has never smoked. She has never used smokeless tobacco. She reports that she does not drink alcohol or use drugs.   Exam: Current vital signs: BP 132/64   Pulse (!) 116   Temp 99.1 F (37.3 C) (Oral)   Resp (!) 33   Ht 5\' 3"  (1.6 m)   Wt 118.5 kg   SpO2 100%   BMI 46.28 kg/m  Vital signs in last 24 hours: Temp:  [99.1 F  (37.3 C)-99.5 F (37.5 C)] 99.1 F (37.3 C) (08/08 0748) Pulse Rate:  [76-116] 116 (08/08 1554) Resp:  [17-33] 33 (08/08 1554) BP: (76-157)/(28-81) 132/64 (08/08 1554) SpO2:  [88 %-100 %] 100 % (08/08 1554) FiO2 (%):  [50 %-60 %] 50 % (08/08 1554) Weight:  [118.5 kg] 118.5 kg (08/08 0500)   Physical Exam  Constitutional: Appears well-developed and well-nourished.  Psych: Appears slightly irritated at the exam Eyes: No scleral injection HENT: ET tube in place Head: Normocephalic.  Cardiovascular: Normal rate and regular rhythm.  Respiratory: Ventilated GI: Soft.  No distension. There is no tenderness.  Skin: WDI  Neuro: Mental Status: Patient is awake, alert, shakes head and nods seemingly rep proprial he Cranial Nerves: II: Visual Fields are full. Pupils are equal, round, and reactive to light.   III,IV, VI: It is unclear to me how cooperative she is being with the extra ocular movement exam, which she does have disconjugate gaze with a left exotropia she does not look completely rightward with either eye, also restricted upgaze V: endorses symmetric sensation to LT VII: relatively symmetric face, though difficult due to ET tube VIII: hearing is intact to voice X: Cough present XII: She  moves her tongue well back-and-forth, no clear weakness, but unclear if mild deviation given ET tube in place Motor: She is able to wiggle toes bilaterally, she wiggles her thumb on her left hand, but I do not see any voluntary movement on her right hand. Sensory: Endorses sensation everywhere, she does grimace to significant noxious stimulation and states that it does hurt.  I am uncertain if it is diminished or not. Deep Tendon Reflexes: Reflexes are absent Plantars: Toes are downgoing bilaterally.  Cerebellar: She cannot perform    I have reviewed labs in epic and the results pertinent to this consultation are: Magnesium 2.0 Phos 8/03   Impression: 68 year old female with severe  muscle weakness and hyporeflexia most consistent with critical care myopathy versus neuropathy.  Her endorsement of sensation would argue for a myopathy.  With either of these, however, it is unusual to have extraocular movement involvement.  She does, however have a history of Graves' disease and I wonder if there could be some degree of Graves' ophthalmoplegia at baseline that she compensates for which she is not currently compensating for.  One other consideration is that there have been case reports of Guillian-Barr syndrome starting 5 to 10 days after symptom onset of coronavirus symptoms.  I think one way to differentiate these would be to sample CSF.   Recommendations: 1) consider LP for cells, glucose, protein, if there is elevated protein, then I think I would consider IVIG. 2) otherwise, supportive care would be the treatment indicated.  Roland Rack, MD [facility] [phone]  If 7pm- 7am, please page neurology on call as listed in [place].

## 2018-12-05 NOTE — Progress Notes (Signed)
Patient's daughter [name] called and was updated on patient's care and status this evening. All questions answered. Video call requested by daughter therefore called her back and facetime facilitated by RN.

## 2018-12-05 NOTE — Progress Notes (Addendum)
Pharmacy Antibiotic Note  [name] [name] is a 68 y.o. female admitted on 10/30/2018 with MRSA bacteremia.  Pharmacy has been consulted for vancomycin dosing. WBC has normalized but repeat blood cultures remain positive for MRSA. Fevers resolved. TTE inconclusive for vegetation. Will likely need TEE.   SCr remains stable. A vanc trough was supratherapeutic at 27.   Plan: -Decrease vancomycin to 1250 mg IV Q 24 hours. Will switch to trough-based dosage. Trough goal of 15-20 mcg/mL  -Monitor renal fx closely    Height: 5\' 3"  (160 cm) Weight: 261 lb 3.9 oz (118.5 kg) IBW/kg (Calculated) : 52.4  Temp (24hrs), Avg:99.4 F (37.4 C), Min:99.1 F (37.3 C), Max:99.9 F (37.7 C)  Recent Labs  Lab 12/01/18 0535 12/02/18 0510 12/03/18 0500 12/04/18 0500 12/05/18 0500 12/05/18 1110  WBC 11.3* 6.7 4.0 3.5* 3.3*  --   CREATININE 1.34* 1.13* 0.85 0.75 0.69  --   VANCOTROUGH  --   --   --   --   --  27*    Estimated Creatinine Clearance: 83.7 mL/min (by C-G formula based on SCr of 0.69 mg/dL).    Allergies  Allergen Reactions  . Sulfonamide Derivatives Itching    Antimicrobials this admission: Vancomycin 8/3 >>   Dose adjustments this admission: None   Microbiology results: 8/2 BCx2: 4/4 MRSA   Thank you for allowing pharmacy to be a part of this patient's care.  Albertina Parr, PharmD., BCPS Clinical Pharmacist Clinical phone for 12/05/18 until 5pm: [phone]  Addendum:  Due to her thrombocytopenia and leukopenia, Dr Baxter Flattery has changed her vanc to dapto since vanc can contribute to this.   Dc vanc Daptomycin 948mg  IV q24 CK baseline and weekly  Onnie Boer, PharmD, BCIDP, AAHIVP, CPP Infectious Disease Pharmacist 12/05/2018 1:20 PM

## 2018-12-05 NOTE — Plan of Care (Signed)

## 2018-12-05 NOTE — Progress Notes (Signed)
Dr. Alcario Drought returned patient regarding CBG of 64.  MD stated to monitor sugar after d50 is given because he doesn't want to give too much fluid to patient by starting her on IVF with dextrose.

## 2018-12-05 NOTE — Progress Notes (Signed)
CRITICAL VALUE ALERT  Critical Value:  Vanc Trough 27  Date & Time Notied: 12/05/2018  Provider Notified: Pharmacy notified [phone]  Orders Received/Actions taken: Hold Vanc

## 2018-12-05 NOTE — Progress Notes (Signed)
ANTICOAGULATION CONSULT NOTE - Follow Up Consult  Pharmacy Consult for Heparin Indication: atrial fibrillation  (Hx DVT on PTA Xarelto)  Allergies  Allergen Reactions  . Sulfonamide Derivatives Itching    Patient Measurements: Height: 5\' 3"  (160 cm) Weight: 261 lb 3.9 oz (118.5 kg) IBW/kg (Calculated) : 52.4 Heparin Dosing Weight: 80.5 kg  Vital Signs: Temp: 98.5 F (36.9 C) (08/08 1600) Temp Source: Oral (08/08 1600) BP: 56/20 (08/08 1800) Pulse Rate: 100 (08/08 1800)  Labs: Recent Labs    12/03/18 0500 12/04/18 0500 12/04/18 1650 12/05/18 0500 12/05/18 1400 12/05/18 1805  HGB 8.3* 8.6*  --  8.4*  --   --   HCT 28.2* 29.8*  --  28.7*  --   --   PLT 58* 56*  --  55*  --   --   HEPARINUNFRC <0.10*  --  0.12* <0.10*  --  0.21*  CREATININE 0.85 0.75  --  0.69  --   --   CKTOTAL  --   --   --   --  2,215*  --     Estimated Creatinine Clearance: 83.7 mL/min (by C-G formula based on SCr of 0.69 mg/dL).   Assessment: 70 yoF admitted on 7/22 with PMH of afib and DVT on chronic Xarelto.  She was transitioned to Heparin IV for possible chest tube (not needed), and resume Xarelto on 7/29.  She then had black stools on 7/30 and Xarelto was d/c (last dose on 7/29 at 1400). Heparin was started on 8/1, however it was placed on hold on 8/5 due to blood seen from pure wick. H/H is low but stable now, Plt down to 56k in the setting of MRSA bacteremia. Pt has low 4T score (2-3) with time course not generally consistent with HIT. Low plts may be due to Sepsis induced DIC. D-dimer has increased from 6.55 to 7.45. Will not bolus given recent bleeding episodes and low platelet counts. Pharmacy is now consulted to resume Heparin IV.   HL came back subtherapeutic again tonight. However, heparin is scheduled to stop at midnight for the trach procedure tomorrow.   Goal of Therapy:  Heparin level 0.3-0.7 units/ml Monitor platelets by anticoagulation protocol: Yes   Plan:  Increase heparin to  1250 units/hr until midnight Daily heparin level and CBC Monitor platelet trend closely   Onnie Boer, PharmD, BCIDP, AAHIVP, CPP Infectious Disease Pharmacist 12/05/2018 6:58 PM

## 2018-12-05 NOTE — Progress Notes (Addendum)
PROGRESS NOTE    [name] [name]  GYJ:[id number] DOB: 1951/01/07 DOA: 10/30/2018 PCP: Nicholos Johns, MD   Brief Narrative:  68 y.o.BF PMHx morbid obesity, DM type II, COPD not on oxygen, HTN, chronic atrial fibrillation CHAD2VASc~4, on Xarelto and Cardizem,spine surgeries in the past,chronic fluid retention, history of Graves' disease now hypothyroid, history of CVA, history of DVT who lives at [facility] and was likely exposed to COVID-19 infection about 10 to 14 days prior to this admission.     She was experiencing fever, body aches and developed shortness of breath.  She finally decided to come into the emergency department at [facility].  She was found to be positive for COVID-19.  Was noted to have hypoxia.  She was hospitalized for further management.    Subjective: 8/8 alert nods appropriately to yes/no questions.  Follows commands, except for unable to move her upper extremities or lower extremities.    Assessment & Plan:   Active Problems:   Essential hypertension   Acute respiratory failure with hypoxemia (HCC)   PAF (paroxysmal atrial fibrillation) (HCC)   Pneumonia due to COVID-19 virus   SOB (shortness of breath)   Ventricular tachyarrhythmia (HCC)   Intubation of airway performed without difficulty   Subcutaneous emphysema (HCC)   Pneumomediastinum (HCC)   Pressure injury of skin   MRSA bacteremia   Atrial fibrillation, chronic   Anasarca   Acute renal failure superimposed on stage 3 chronic kidney disease ([place])   Controlled type 2 diabetes mellitus with hyperglycemia (HCC)   Acute blood loss anemia   Thrombocytopenia (HCC)  Acute respiratory failure with hypoxia/COVID 19 pneumonia/ARDS -Completed course Remdesivir -Dexamethasone started 7/22 6 mg daily tapered and stopped at 14 days. -Actemra x2 doses 7/22 and 7/23 -Continue vitamins per COVID protocol -Afebrile overnight - 8/6 PCXR shows worsening pneumonia see results below - 8/8 continue  daily weaning trials - 8/8 patient now off continuous Versed, Precedex, will wean patient off of clonazepam. -Fentanyl as needed, Versed PRN, oxycodone IR as needed -8/8 wean patient off of clonazepam  Recent Labs  Lab 12/01/18 0535 12/02/18 0510 12/03/18 0500 12/04/18 0500 12/05/18 0500  CRP 1.5* 1.6* 1.1* 1.7* 6.1*   Recent Labs  Lab 12/01/18 0535 12/02/18 0510 12/03/18 0500 12/04/18 0500 12/05/18 0500  DDIMER 8.65* 3.81* 6.55* 17.45* 18.07*    Asthma vs COPD - See acute respiratory failure  Subcutaneous emphysema/pneumomediastinum - 7/26 diagnosis subcutaneous emphysema.  CXR also suggested mediastinal emphysema.  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  Stable   Bilateral hemiparalysis - Patient unable to move bilateral upper extremities or bilateral lower extremities.  States she can feel when she is being touched on her extremities but does not register pain. - Have consulted neurology will await their recommendations.  MRSA bacteremia -Central/PIC line removed -Patient placed on appropriate antibiotics -Patient will receive 24 to 48-hour line holiday - Blood culture NGTD see below - 7/24 echocardiogram negative vegetation.  However patient diagnosed with MRSA bacteremia on 8/2 repeat echocardiogram most likely will be required.  Would prefer TEE.  Will need to discuss with cardiology - 8/2 repeat blood cultures positive staph aureus speciation and susceptibilities pending. -8/7 discussed case with cardiology recommend empiric coverage for possible endocarditis.  Will not perform TEE at this time as patient has COVID and procedure  is an aerosol lysing procedure. -8/7 discussed case with Dr. Carlyle Basques, ID concerning restarting antibiotics.  Does not  believe patient is a vancomycin failure therefore recommends restarting vancomycin for minimum of 4 weeks treatment. -8/8 Dr. Carlyle Basques ID has changed vancomycin---> daptomycin for a 4 to 6-week course of antibiotics. - Recommend at day 21 again request that cardiology performed TEE.  Enterovesical Fistula ? -Some concern that patient may have a fistula, but I am not fully convinced patient has such fistula.  Usually patient would have a significant leukocytosis, fever etc.  But given that she has been on immunosuppressive medication we will proceed as if fistula possible. - Obtain urinalysis - Obtain urine culture - 8/7 discussed case with radiology they recommended CT abdomen/pelvis with oral and IV contrast. -Dependent upon findings consult GI or GYN.  -8/8 CT abdomen with: Inconclusive, see results below will consult GI on 8/9 discussed case going forward.  Chronic A. fib with episodes of V. tach - At home was treated with Xarelto -Amiodarone drip -Digoxin 0.125 mg daily -Metoprolol 2.5 mg QID -Metoprolol PRN - Currently rate controlled - 8/5 all anticoagulants held currently.  Discussed case with RN and patient is continuing to bleed, pure wick looked like a tampon when it was changed out.  Will reassess starting anticoagulant in next 24 to 48 hours. - Patient also thrombocytopenic if she continues to bleed will transfuse unit of platelets.  -Platelets low normal prior to admission -CHADS2VASc score~3 -8/6 bleeding has stabilized continue to hold all anticoagulants for another 24 hours will then make decision on whether to restart. 8/7 restart heparin drip, patient with d-dimer continue to increase see acute respiratory failure.  Will request pharmacy maintain low end of therapeutic level secondary to previous bleed  poor circulation. -Bilateral lower extremity cool to touch although pulses are present but faint.  Anasarca. -Appears to be third spacing with fluid. -Strict in  and out +10.8 L -Daily weight Filed Weights   11/30/18 0500 12/01/18 0500 12/05/18 0500  Weight: 119.6 kg 118.3 kg 118.5 kg  -q4hr CVP -8/8 Albumin 50 g + Lasix 60 mg.  Continue PRN albumin+ Lasix  Acute on CKD stage III ?  (Cr 12/25/2016 0.90) -Unknown baseline renal function -Baseline renal function not entirely known.  Recent Labs  Lab 12/01/18 0535 12/02/18 0510 12/03/18 0500 12/04/18 0500 12/05/18 0500  CREATININE 1.34* 1.13* 0.85 0.75 0.69  -Improved  Hypothyroidism -Continue levothyroxine.   -As noted above her TSH was noted to be low.  Free T4 is 1.0.  No further work-up.  Low TSH likely sick euthyroid.  Hx stroke  -Patient was on Xarelto and aspirin, have been switched over to IV heparin however given her ongoing bleeding all anticoagulants on hold.   History of vitamin D deficiency Continue supplementation.  Hx DVT -Xarelto on hold secondary to bleeding   Diabetes type 2 controlled with hyperglycemia -7/22 hemoglobin A1c = 7.0  - Levemir 50 units twice daily - NovoLog 10 units Q 4 hours - Resistant SSI  Morbid obesity Body mass index is 46.2 kg/m.   Nutrition Continue tube feedings.  Constipation resolved Continue bowel regimen.  Mildly elevated AST. Monitor for now.  Acute blood loss anemia/Bleeding from oral cavity/melena -Patient had been on Xarelto and heparin all anticoagulants discontinued -Resolved  -Hemoglobin on admission was 12.6. Recent Labs  Lab 12/01/18 0535 12/02/18 0510 12/03/18 0500 12/04/18 0500 12/05/18 0500  HGB 9.6* 8.4* 8.3* 8.6* 8.4*  - Transfuse for hemoglobin<7 -  Monitor closely now that heparin has been restarted.  Acute thrombocytopenia - Most likely secondary to MRSA bacteremia -HIT panel negative  -If bleeding resumes transfuse platelets   Leukocytosis - Most likely secondary to infection - We will type and cross    DVT prophylaxis: Heparin  Code Status: Partial Family Communication:  Spoke with Marciano Sequin daughter/Laverne daughter-in-law/son; discussed plan of care answered all questions.  They agreed that placement of a tracheostomy tube would be in patient's best interest.  Understood placement would occur sometime next week and that they were received a call prior to surgery. ADDENDUM; spoke with Marciano Sequin daughter informed her that tracheostomy and LP would occur tomorrow~1500 answered all questions. Disposition Plan: TBD   Consultants:  PCCM Cardiology phone consult Dr. Buford Dresser Neurology Dr. Kathrynn Speed    Procedures/Significant Events:  7/23 intubation >Central line insertion 11/19/2018 >Cortrack replacement 11/25/2018 >Transthoracic echocardiogram 7/24 1. The left ventricle has hyperdynamic systolic function, with an ejection fraction of >65%. The cavity size was normal. Left ventricular diastolic Doppler parameters are consistent with impaired relaxation. 2. The right ventricle has normal systolc function. The cavity was normal. There is no increase in right ventricular wall thickness. Right ventricular systolic pressure is normal. 3. Mild thickening of the aortic valve. No stenosis of the aortic valve. 4. The aortic root and ascending aorta are normal in size and structure. > Limited echocardiogram 12/01/2018 no vegetation. 8/6 PCXR: New LEFT supra clavicular soft tissue emphysema, no visible pneumothorax - Worsening pneumonia, chronically elevated RIGHT diaphragm 8/7 CT abdomen pelvis W contrast:Nonspecific thickening of the urinary bladder.-no evidence of contrast fistulation from the urinary bladder to the bowel on delayed urographic contrast phase, however this does not exclude the presence of enterovesical fistula. There is no air within the urinary bladder, which is highly characteristic of fistula. More definitive evaluation will require fluoroscopic cystogram and/or contrast enema when clinically appropriate. -colon is fluid-filled  to the rectum, in keeping with diarrheal illness, and a rectal tube is in place. -extensive, dense consolidation and ground-glass opacity in the included bilateral lung bases.    I have personally reviewed and interpreted all radiology studies and my findings are as above.  VENTILATOR SETTINGS:    Cultures 8/4 blood RIGHT handx2 positive staph aureus 8/6 blood pending      Antimicrobials: Anti-infectives (From admission, onward)   Start     Stop   12/02/18 1400  vancomycin (VANCOCIN) 1,750 mg in sodium chloride 0.9 % 500 mL IVPB  Status:  Discontinued     12/02/18 1151   12/02/18 1300  vancomycin (VANCOCIN) IVPB 1000 mg/200 mL premix         11/30/18 1430  vancomycin (VANCOCIN) 2,500 mg in sodium chloride 0.9 % 500 mL IVPB     11/30/18 1711   11/19/18 2030  remdesivir 100 mg in sodium chloride 0.9 % 250 mL IVPB     11/22/18 2204   10/30/2018 2030  remdesivir 200 mg in sodium chloride 0.9 % 250 mL IVPB     11/20/2018 2056       Devices    LINES / TUBES:      Continuous Infusions:  sodium chloride Stopped (12/01/18 1300)   amiodarone 30 mg/hr (12/05/18 0700)   fentaNYL infusion INTRAVENOUS 175 mcg/hr (12/05/18 0700)   heparin 900 Units/hr (12/05/18 0700)   phenylephrine (NEO-SYNEPHRINE) Adult infusion 20 mcg/min (12/05/18 0700)   vancomycin Stopped (12/05/18 0049)     Objective: Vitals:   12/05/18 0615 12/05/18 0630 12/05/18  0645 12/05/18 0700  BP: (!) 126/55 (!) 122/56 105/65 106/60  Pulse: 86 85 85 87  Resp: (!) 26 (!) 22 (!) 26 (!) 23  Temp:      TempSrc:      SpO2: 94% 95% 93% 94%  Weight:      Height:        Intake/Output Summary (Last 24 hours) at 12/05/2018 0737 Last data filed at 12/05/2018 0700 Gross per 24 hour  Intake 4297.57 ml  Output 1450 ml  Net 2847.57 ml   Filed Weights   11/30/18 0500 12/01/18 0500 12/05/18 0500  Weight: 119.6 kg 118.3 kg 118.5 kg   Physical Exam:  General: Alert, nods her head yes and no appropriately to  questions, positive acute respiratory distress Eyes: negative scleral hemorrhage, negative anisocoria, negative icterus ENT: Negative Runny nose, negative gingival bleeding, #7.5 cuffed ETT tube in place negative sign of infection or bleeding Neck:  Negative scars, masses, torticollis, lymphadenopathy, JVD Lungs: Tachypneic clear to auscultation bilaterally without wheezes or crackles Cardiovascular: Regular rate and rhythm without murmur gallop or rub normal S1 and S2 Abdomen: Obese, negative abdominal pain, nondistended, positive soft, bowel sounds, no rebound, no ascites, no appreciable mass Extremities: No significant cyanosis, clubbing, or edema bilateral lower extremities Skin: Negative rashes, lesions, ulcers Psychiatric: Could not fully evaluate secondary to patient's intubation   Central nervous system: Alert, nodded appropriately to yes and no questions    .     Data Reviewed: Care during the described time interval was provided by me .  I have reviewed this patient's available data, including medical history, events of note, physical examination, and all test results as part of my evaluation.   CBC: Recent Labs  Lab 12/01/18 0535 12/02/18 0510 12/03/18 0500 12/04/18 0500 12/05/18 0500  WBC 11.3* 6.7 4.0 3.5* 3.3*  NEUTROABS 9.9* 5.9 3.2 2.5 2.2  HGB 9.6* 8.4* 8.3* 8.6* 8.4*  HCT 31.9* 27.6* 28.2* 29.8* 28.7*  MCV 93.0 95.2 96.9 98.3 98.3  PLT 81* 65* 58* 56* 55*   Basic Metabolic Panel: Recent Labs  Lab 11/29/18 2230  12/01/18 0535 12/02/18 0510 12/03/18 0500 12/04/18 0500 12/05/18 0500  NA 146*   < > 142 143 143 143 141  K 4.5   < > 4.2 4.2 4.4 4.3 4.5  CL 100   < > 102 101 102 102 103  CO2 32   < > 33* 35* 33* 33* 30  GLUCOSE 214*   < > 191* 181* 177* 135* 114*  BUN 91*   < > 99* 85* 70* 47* 40*  CREATININE 1.30*   < > 1.34* 1.13* 0.85 0.75 0.69  CALCIUM 7.7*   < > 7.2* 7.4* 7.5* 7.5* 7.4*  MG  --    < > 2.5* 2.5* 2.5* 2.1 2.0  PHOS 3.2  --   --   --    --   --   --    < > = values in this interval not displayed.   GFR: Estimated Creatinine Clearance: 83.7 mL/min (by C-G formula based on SCr of 0.69 mg/dL). Liver Function Tests: Recent Labs  Lab 12/01/18 0535 12/02/18 0510 12/03/18 0500 12/04/18 0500 12/05/18 0500  AST 88* 85* 64* 80* 113*  ALT 70* 97* 100* 99* 112*  ALKPHOS 82 61 58 63 66  BILITOT 1.1 1.3* 1.1 1.4* 1.5*  PROT 4.7* 4.7* 4.7* 4.7* 4.6*  ALBUMIN 2.3* 2.6* 2.4* 2.2* 2.1*   No results for input(s): LIPASE, AMYLASE in the  last 168 hours. No results for input(s): AMMONIA in the last 168 hours. Coagulation Profile: No results for input(s): INR, PROTIME in the last 168 hours. Cardiac Enzymes: No results for input(s): CKTOTAL, CKMB, CKMBINDEX, TROPONINI in the last 168 hours. BNP (last 3 results) No results for input(s): PROBNP in the last 8760 hours. HbA1C: No results for input(s): HGBA1C in the last 72 hours. CBG: Recent Labs  Lab 12/04/18 0741 12/04/18 1521 12/04/18 2001 12/04/18 2259 12/05/18 0422  GLUCAP 110* 153* 88 84 112*   Lipid Profile: No results for input(s): CHOL, HDL, LDLCALC, TRIG, CHOLHDL, LDLDIRECT in the last 72 hours. Thyroid Function Tests: No results for input(s): TSH, T4TOTAL, FREET4, T3FREE, THYROIDAB in the last 72 hours. Anemia Panel: No results for input(s): VITAMINB12, FOLATE, FERRITIN, TIBC, IRON, RETICCTPCT in the last 72 hours. Urine analysis:    Component Value Date/Time   COLORURINE YELLOW 11/30/2018 0400   APPEARANCEUR HAZY (A) 11/30/2018 0400   LABSPEC <1.005 (L) 11/30/2018 0400   PHURINE >9.0 (H) 11/30/2018 0400   GLUCOSEU NEGATIVE 11/30/2018 0400   HGBUR SMALL (A) 11/30/2018 0400   BILIRUBINUR NEGATIVE 11/30/2018 0400   KETONESUR NEGATIVE 11/30/2018 0400   PROTEINUR TRACE (A) 11/30/2018 0400   UROBILINOGEN 1.0 06/20/2014 0442   NITRITE NEGATIVE 11/30/2018 0400   LEUKOCYTESUR SMALL (A) 11/30/2018 0400   Sepsis  Labs: @LABRCNTIP (procalcitonin:4,lacticidven:4)  ) Recent Results (from the past 240 hour(s))  Culture, blood (routine x 2)     Status: Abnormal   Collection Time: 11/29/18 10:55 PM   Specimen: BLOOD  Result Value Ref Range Status   Specimen Description BLOOD RIGHT ANTECUBITAL  Final   Special Requests   Final    BOTTLES DRAWN AEROBIC AND ANAEROBIC Blood Culture adequate volume   Culture  Setup Time   Final    GRAM POSITIVE COCCI IN CLUSTERS IN BOTH AEROBIC AND ANAEROBIC BOTTLES CRITICAL RESULT CALLED TO, READ BACK BY AND VERIFIED WITH: [facility] CAREN AMEND [id number] FCP Performed at [facility], [place] [address] [place], [place] [address]    Culture METHICILLIN RESISTANT STAPHYLOCOCCUS AUREUS (A)  Final   Report Status 12/02/2018 FINAL  Final   Organism ID, Bacteria METHICILLIN RESISTANT STAPHYLOCOCCUS AUREUS  Final      Susceptibility   Methicillin resistant staphylococcus aureus - MIC*    CIPROFLOXACIN <=0.5 SENSITIVE Sensitive     ERYTHROMYCIN >=8 RESISTANT Resistant     GENTAMICIN <=0.5 SENSITIVE Sensitive     OXACILLIN >=4 RESISTANT Resistant     TETRACYCLINE <=1 SENSITIVE Sensitive     VANCOMYCIN 1 SENSITIVE Sensitive     TRIMETH/SULFA <=10 SENSITIVE Sensitive     CLINDAMYCIN <=0.25 SENSITIVE Sensitive     RIFAMPIN <=0.5 SENSITIVE Sensitive     Inducible Clindamycin NEGATIVE Sensitive     * METHICILLIN RESISTANT STAPHYLOCOCCUS AUREUS  Blood Culture ID Panel (Reflexed)     Status: Abnormal   Collection Time: 11/29/18 10:55 PM  Result Value Ref Range Status   Enterococcus species NOT DETECTED NOT DETECTED Final   Listeria monocytogenes NOT DETECTED NOT DETECTED Final   Staphylococcus species DETECTED (A) NOT DETECTED Final    Comment: CRITICAL RESULT CALLED TO, READ BACK BY AND VERIFIED WITH: [facility] CAREN AMEND [id number] FCP    Staphylococcus aureus (BCID) DETECTED (A) NOT DETECTED Final    Comment: Methicillin (oxacillin)-resistant Staphylococcus aureus  (MRSA). MRSA is predictably resistant to beta-lactam antibiotics (except ceftaroline). Preferred therapy is vancomycin unless clinically contraindicated. Patient requires contact precautions if  hospitalized. CRITICAL  RESULT CALLED TO, READ BACK BY AND VERIFIED WITH: PHARMD CAREN AMEND [id number] FCP    Methicillin resistance DETECTED (A) NOT DETECTED Final    Comment: CRITICAL RESULT CALLED TO, READ BACK BY AND VERIFIED WITH: PHARMD CAREN AMEND [id number] FCP    Streptococcus species NOT DETECTED NOT DETECTED Final   Streptococcus agalactiae NOT DETECTED NOT DETECTED Final   Streptococcus pneumoniae NOT DETECTED NOT DETECTED Final   Streptococcus pyogenes NOT DETECTED NOT DETECTED Final   Acinetobacter baumannii NOT DETECTED NOT DETECTED Final   Enterobacteriaceae species NOT DETECTED NOT DETECTED Final   Enterobacter cloacae complex NOT DETECTED NOT DETECTED Final   Escherichia coli NOT DETECTED NOT DETECTED Final   Klebsiella oxytoca NOT DETECTED NOT DETECTED Final   Klebsiella pneumoniae NOT DETECTED NOT DETECTED Final   Proteus species NOT DETECTED NOT DETECTED Final   Serratia marcescens NOT DETECTED NOT DETECTED Final   Haemophilus influenzae NOT DETECTED NOT DETECTED Final   Neisseria meningitidis NOT DETECTED NOT DETECTED Final   Pseudomonas aeruginosa NOT DETECTED NOT DETECTED Final   Candida albicans NOT DETECTED NOT DETECTED Final   Candida glabrata NOT DETECTED NOT DETECTED Final   Candida krusei NOT DETECTED NOT DETECTED Final   Candida parapsilosis NOT DETECTED NOT DETECTED Final   Candida tropicalis NOT DETECTED NOT DETECTED Final    Comment: Performed at [facility], [place]. [address], [place], [place] [address]  Culture, blood (routine x 2)     Status: Abnormal   Collection Time: 11/29/18 11:05 PM   Specimen: BLOOD RIGHT HAND  Result Value Ref Range Status   Specimen Description BLOOD RIGHT HAND  Final   Special Requests   Final    BOTTLES DRAWN AEROBIC AND  ANAEROBIC Blood Culture adequate volume   Culture  Setup Time   Final    GRAM POSITIVE COCCI IN BOTH AEROBIC AND ANAEROBIC BOTTLES CRITICAL VALUE NOTED.  VALUE IS CONSISTENT WITH PREVIOUSLY REPORTED AND CALLED VALUE.    Culture (A)  Final    STAPHYLOCOCCUS AUREUS SUSCEPTIBILITIES PERFORMED ON PREVIOUS CULTURE WITHIN THE LAST 5 DAYS. Performed at [facility], [place] [address], [place], [place] [address]    Report Status 12/02/2018 FINAL  Final  Culture, blood (Routine X 2) w Reflex to ID Panel     Status: Abnormal   Collection Time: 12/01/18  2:14 PM   Specimen: BLOOD RIGHT HAND  Result Value Ref Range Status   Specimen Description   Final    BLOOD RIGHT HAND Performed at [facility], [place] [address], [place], [place] [address]    Special Requests   Final    BOTTLES DRAWN AEROBIC AND ANAEROBIC Blood Culture adequate volume Performed at [place] [address], [place], [place] [address]    Culture  Setup Time   Final    GRAM POSITIVE COCCI ANAEROBIC BOTTLE ONLY CRITICAL VALUE NOTED.  VALUE IS CONSISTENT WITH PREVIOUSLY REPORTED AND CALLED VALUE.    Culture (A)  Final    STAPHYLOCOCCUS AUREUS SUSCEPTIBILITIES PERFORMED ON PREVIOUS CULTURE WITHIN THE LAST 5 DAYS. Performed at [facility], [place] [address] [place], [place] [address]    Report Status 12/04/2018 FINAL  Final  Culture, blood (Routine X 2) w Reflex to ID Panel     Status: Abnormal   Collection Time: 12/01/18  2:20 PM   Specimen: BLOOD  Result Value Ref Range Status   Specimen Description   Final    BLOOD  RIGHT HAND Performed at [facility], [place] [address] [place], [place] [address]    Special Requests   Final    BOTTLES DRAWN AEROBIC AND ANAEROBIC Blood Culture adequate volume Performed at [place] [address], [place], [place] [address]    Culture  Setup Time   Final    IN BOTH AEROBIC AND ANAEROBIC BOTTLES GRAM POSITIVE  COCCI CRITICAL VALUE NOTED.  VALUE IS CONSISTENT WITH PREVIOUSLY REPORTED AND CALLED VALUE.    Culture (A)  Final    STAPHYLOCOCCUS AUREUS SUSCEPTIBILITIES PERFORMED ON PREVIOUS CULTURE WITHIN THE LAST 5 DAYS. Performed at [facility], [place] [address] [place], [place] [address]    Report Status 12/04/2018 FINAL  Final  Culture, blood (Routine X 2) w Reflex to ID Panel     Status: None (Preliminary result)   Collection Time: 12/03/18  4:16 PM   Specimen: BLOOD  Result Value Ref Range Status   Specimen Description   Final    BLOOD RIGHT ARM Performed at [place] [address] [place], [place] [address]    Special Requests   Final    BOTTLES DRAWN AEROBIC AND ANAEROBIC Blood Culture adequate volume Performed at [place] [address] [place], [place] [address]    Culture   Final    NO GROWTH < 12 HOURS Performed at [place] [address], [place], [place] [address]    Report Status PENDING  Incomplete  Culture, blood (Routine X 2) w Reflex to ID Panel     Status: None (Preliminary result)   Collection Time: 12/03/18  4:29 PM   Specimen: BLOOD  Result Value Ref Range Status   Specimen Description   Final    BLOOD RIGHT ARM Performed at [place] [address], [place], [place] [address]    Special Requests   Final    BOTTLES DRAWN AEROBIC AND ANAEROBIC Blood Culture adequate volume Performed at [place] [address], [place], [place] [address]    Culture   Final    NO GROWTH < 12 HOURS Performed at [place] [address] [place],  [address]    Report Status PENDING  Incomplete         Radiology Studies: Ct Abdomen Pelvis W Contrast  Result Date: 12/04/2018 CLINICAL DATA:  Enterovesical fistula EXAM: CT ABDOMEN AND PELVIS WITH CONTRAST TECHNIQUE: Multidetector CT imaging of the abdomen and pelvis was performed using the standard  protocol following bolus administration of intravenous contrast. CONTRAST:  147mL OMNIPAQUE IOHEXOL 300 MG/ML  SOLN COMPARISON:  None. FINDINGS: Lower chest: There is extensive, dense consolidation and ground-glass opacity in the included bilateral lung bases. Hepatobiliary: No solid liver abnormality is seen. No gallstones, gallbladder wall thickening, or biliary dilatation. Pancreas: Unremarkable. No pancreatic ductal dilatation or surrounding inflammatory changes. Spleen: Normal in size without significant abnormality. Adrenals/Urinary Tract: Adrenal glands are unremarkable. Kidneys are normal, without renal calculi, solid lesion, or hydronephrosis. Thickening of the urinary bladder. Stomach/Bowel: Stomach is within normal limits. Weighted enteric feeding tube is positioned with tip in the transverse portion of the duodenum. Appendix appears normal. No evidence of bowel wall thickening, distention, or inflammatory changes. The colon is fluid-filled to the rectum and a rectal tube is in place. Vascular/Lymphatic: Aortic atherosclerosis. No enlarged abdominal or pelvic lymph nodes. Reproductive: No mass or other significant abnormality. Other: No abdominal wall hernia or abnormality. Trace  ascites in the abdomen and pelvis. Musculoskeletal: No acute or significant osseous findings. IMPRESSION: 1. Nonspecific thickening of the urinary bladder. There is no evidence of contrast fistulation from the urinary bladder to the bowel on delayed urographic contrast phase, however this does not exclude the presence of enterovesical fistula. There is no air within the urinary bladder, which is highly characteristic of fistula. More definitive evaluation will require fluoroscopic cystogram and/or contrast enema when clinically appropriate. 2. The colon is fluid-filled to the rectum, in keeping with diarrheal illness, and a rectal tube is in place. 3.  Trace nonspecific ascites in the abdomen and pelvis. 4. There is extensive,  dense consolidation and ground-glass opacity in the included bilateral lung bases. Electronically Signed   By: Eddie Candle M.D.   On: 12/04/2018 16:32        Scheduled Meds:  sodium chloride   Intravenous Once   chlorhexidine  15 mL Mouth/Throat BID   clonazePAM  0.5 mg Per Tube BID   digoxin  0.125 mg Intravenous Daily   feeding supplement (PRO-STAT SUGAR FREE 64)  30 mL Per Tube QID   feeding supplement (VITAL AF 1.2 CAL)  1,000 mL Per Tube Q24H   fentaNYL (SUBLIMAZE) injection  50 mcg Intravenous Once   ferrous sulfate  300 mg Per Tube TID WC   free water  300 mL Per Tube Q6H   gabapentin  100 mg Per Tube Q8H   insulin aspart  0-20 Units Subcutaneous Q4H   insulin aspart  10 Units Subcutaneous Q4H   insulin detemir  50 Units Subcutaneous BID   levothyroxine  112 mcg Per Tube Q0600   mouth rinse  15 mL Mouth Rinse 10 times per day   metoprolol tartrate  2.5 mg Intravenous Q6H   multivitamin with minerals  1 tablet Per Tube Daily   oxyCODONE  5 mg Per Tube Q12H   pantoprazole sodium  40 mg Per Tube BID   polyethylene glycol  17 g Per Tube Daily   sodium chloride flush  10-40 mL Intracatheter Q12H   vitamin C  500 mg Per Tube Daily   zinc sulfate  220 mg Per Tube Daily   Continuous Infusions:  sodium chloride Stopped (12/01/18 1300)   amiodarone 30 mg/hr (12/05/18 0700)   fentaNYL infusion INTRAVENOUS 175 mcg/hr (12/05/18 0700)   heparin 900 Units/hr (12/05/18 0700)   phenylephrine (NEO-SYNEPHRINE) Adult infusion 20 mcg/min (12/05/18 0700)   vancomycin Stopped (12/05/18 0049)     LOS: 17 days   The patient is critically ill with multiple organ systems failure and requires high complexity decision making for assessment and support, frequent evaluation and titration of therapies, application of advanced monitoring technologies and extensive interpretation of multiple databases. Critical Care Time devoted to patient care services described in this  note  Time spent: 40 minutes     Bryker Fletchall, Geraldo Docker, MD [facility] Pager [phone]  If 7PM-7AM, please contact night-coverage [email or web address] Password [facility] 12/05/2018, 7:37 AM

## 2018-12-06 DIAGNOSIS — R748 Abnormal levels of other serum enzymes: Secondary | ICD-10-CM

## 2018-12-06 DIAGNOSIS — E118 Type 2 diabetes mellitus with unspecified complications: Secondary | ICD-10-CM | POA: Diagnosis present

## 2018-12-06 DIAGNOSIS — I38 Endocarditis, valve unspecified: Secondary | ICD-10-CM

## 2018-12-06 DIAGNOSIS — R6521 Severe sepsis with septic shock: Secondary | ICD-10-CM

## 2018-12-06 DIAGNOSIS — J9611 Chronic respiratory failure with hypoxia: Secondary | ICD-10-CM

## 2018-12-06 DIAGNOSIS — A419 Sepsis, unspecified organism: Secondary | ICD-10-CM

## 2018-12-06 DIAGNOSIS — G8190 Hemiplegia, unspecified affecting unspecified side: Secondary | ICD-10-CM

## 2018-12-06 LAB — CBC WITH DIFFERENTIAL/PLATELET
Abs Immature Granulocytes: 0.01 10*3/uL (ref 0.00–0.07)
Basophils Absolute: 0 10*3/uL (ref 0.0–0.1)
Basophils Relative: 1 %
Eosinophils Absolute: 0.1 10*3/uL (ref 0.0–0.5)
Eosinophils Relative: 4 %
HCT: 26.9 % — ABNORMAL LOW (ref 36.0–46.0)
Hemoglobin: 8.1 g/dL — ABNORMAL LOW (ref 12.0–15.0)
Immature Granulocytes: 1 %
Lymphocytes Relative: 27 %
Lymphs Abs: 0.6 10*3/uL — ABNORMAL LOW (ref 0.7–4.0)
MCH: 29.5 pg (ref 26.0–34.0)
MCHC: 30.1 g/dL (ref 30.0–36.0)
MCV: 97.8 fL (ref 80.0–100.0)
Monocytes Absolute: 0.1 10*3/uL (ref 0.1–1.0)
Monocytes Relative: 5 %
Neutro Abs: 1.4 10*3/uL — ABNORMAL LOW (ref 1.7–7.7)
Neutrophils Relative %: 62 %
Platelets: 57 10*3/uL — ABNORMAL LOW (ref 150–400)
RBC: 2.75 MIL/uL — ABNORMAL LOW (ref 3.87–5.11)
RDW: 25.2 % — ABNORMAL HIGH (ref 11.5–15.5)
WBC: 2.2 10*3/uL — ABNORMAL LOW (ref 4.0–10.5)
nRBC: 5.5 % — ABNORMAL HIGH (ref 0.0–0.2)

## 2018-12-06 LAB — GLUCOSE, CAPILLARY
Glucose-Capillary: 101 mg/dL — ABNORMAL HIGH (ref 70–99)
Glucose-Capillary: 110 mg/dL — ABNORMAL HIGH (ref 70–99)
Glucose-Capillary: 115 mg/dL — ABNORMAL HIGH (ref 70–99)
Glucose-Capillary: 140 mg/dL — ABNORMAL HIGH (ref 70–99)
Glucose-Capillary: 154 mg/dL — ABNORMAL HIGH (ref 70–99)
Glucose-Capillary: 204 mg/dL — ABNORMAL HIGH (ref 70–99)

## 2018-12-06 LAB — APTT: aPTT: 27 seconds (ref 24–36)

## 2018-12-06 LAB — COMPREHENSIVE METABOLIC PANEL
ALT: 109 U/L — ABNORMAL HIGH (ref 0–44)
AST: 119 U/L — ABNORMAL HIGH (ref 15–41)
Albumin: 2.4 g/dL — ABNORMAL LOW (ref 3.5–5.0)
Alkaline Phosphatase: 64 U/L (ref 38–126)
Anion gap: 10 (ref 5–15)
BUN: 36 mg/dL — ABNORMAL HIGH (ref 8–23)
CO2: 31 mmol/L (ref 22–32)
Calcium: 7.5 mg/dL — ABNORMAL LOW (ref 8.9–10.3)
Chloride: 100 mmol/L (ref 98–111)
Creatinine, Ser: 0.82 mg/dL (ref 0.44–1.00)
GFR calc Af Amer: >60 mL/min (ref >60)
GFR calc non Af Amer: >60 mL/min (ref >60)
Glucose, Bld: 122 mg/dL — ABNORMAL HIGH (ref 70–99)
Potassium: 3.8 mmol/L (ref 3.5–5.1)
Sodium: 141 mmol/L (ref 135–145)
Total Bilirubin: 1.6 mg/dL — ABNORMAL HIGH (ref 0.3–1.2)
Total Protein: 4.8 g/dL — ABNORMAL LOW (ref 6.5–8.1)

## 2018-12-06 LAB — MAGNESIUM: Magnesium: 1.8 mg/dL (ref 1.7–2.4)

## 2018-12-06 LAB — POCT I-STAT 7, (LYTES, BLD GAS, ICA,H+H)
Acid-Base Excess: 11 mmol/L — ABNORMAL HIGH (ref 0.0–2.0)
Bicarbonate: 33.1 mmol/L — ABNORMAL HIGH (ref 20.0–28.0)
Calcium, Ion: 1.04 mmol/L — ABNORMAL LOW (ref 1.15–1.40)
HCT: 25 % — ABNORMAL LOW (ref 36.0–46.0)
Hemoglobin: 8.5 g/dL — ABNORMAL LOW (ref 12.0–15.0)
O2 Saturation: 93 %
Patient temperature: 99.3
Potassium: 3.6 mmol/L (ref 3.5–5.1)
Sodium: 140 mmol/L (ref 135–145)
TCO2: 34 mmol/L — ABNORMAL HIGH (ref 22–32)
pCO2 arterial: 35.6 mmHg (ref 32.0–48.0)
pH, Arterial: 7.578 — ABNORMAL HIGH (ref 7.350–7.450)
pO2, Arterial: 59 mmHg — ABNORMAL LOW (ref 83.0–108.0)

## 2018-12-06 LAB — PROTIME-INR
INR: 1.1 (ref 0.8–1.2)
Prothrombin Time: 14 seconds (ref 11.4–15.2)

## 2018-12-06 LAB — D-DIMER, QUANTITATIVE: D-Dimer, Quant: 12.97 ug/mL-FEU — ABNORMAL HIGH (ref 0.00–0.50)

## 2018-12-06 LAB — T4, FREE: Free T4: 0.65 ng/dL (ref 0.61–1.12)

## 2018-12-06 LAB — C-REACTIVE PROTEIN: CRP: 5.3 mg/dL — ABNORMAL HIGH (ref <1.0)

## 2018-12-06 MED ORDER — FENTANYL CITRATE (PF) 100 MCG/2ML IJ SOLN
200.0000 ug | Freq: Once | INTRAMUSCULAR | Status: AC
  Administered 2018-12-07: 100 ug via INTRAVENOUS
  Filled 2018-12-06: qty 4

## 2018-12-06 MED ORDER — PROPOFOL 10 MG/ML IV BOLUS
500.0000 mg | Freq: Once | INTRAVENOUS | Status: DC
  Filled 2018-12-06: qty 60

## 2018-12-06 MED ORDER — VECURONIUM BROMIDE 10 MG IV SOLR
10.0000 mg | Freq: Once | INTRAVENOUS | Status: AC
  Filled 2018-12-06: qty 10

## 2018-12-06 MED ORDER — MAGNESIUM SULFATE IN D5W 1-5 GM/100ML-% IV SOLN
1.0000 g | Freq: Once | INTRAVENOUS | Status: AC
  Administered 2018-12-06: 1 g via INTRAVENOUS
  Filled 2018-12-06: qty 100

## 2018-12-06 MED ORDER — ETOMIDATE 2 MG/ML IV SOLN
40.0000 mg | Freq: Once | INTRAVENOUS | Status: DC
  Filled 2018-12-06: qty 20

## 2018-12-06 MED ORDER — MIDODRINE HCL 5 MG PO TABS
5.0000 mg | ORAL_TABLET | Freq: Three times a day (TID) | ORAL | Status: DC
  Administered 2018-12-06 – 2018-12-09 (×10): 5 mg
  Filled 2018-12-06 (×13): qty 1

## 2018-12-06 MED ORDER — AMIODARONE HCL 200 MG PO TABS
200.0000 mg | ORAL_TABLET | Freq: Two times a day (BID) | ORAL | Status: DC
  Administered 2018-12-06 – 2018-12-09 (×7): 200 mg
  Filled 2018-12-06 (×7): qty 1

## 2018-12-06 MED ORDER — MIDAZOLAM HCL 2 MG/2ML IJ SOLN
5.0000 mg | Freq: Once | INTRAMUSCULAR | Status: AC
  Administered 2018-12-07: 2 mg via INTRAVENOUS
  Filled 2018-12-06: qty 6

## 2018-12-06 MED ORDER — POTASSIUM CHLORIDE 20 MEQ/15ML (10%) PO SOLN
40.0000 meq | Freq: Once | ORAL | Status: AC
  Administered 2018-12-06: 40 meq
  Filled 2018-12-06: qty 30

## 2018-12-06 NOTE — Progress Notes (Signed)
NAME:  [name] [name], MRN:  [id number], DOB:  Mar 30, 1951, LOS: 69 ADMISSION DATE:  11/04/2018, CONSULTATION DATE:  July 23 REFERRING MD:  Candiss Norse, CHIEF COMPLAINT:  dyspnea   Brief History   68 year old female admitted for severe acute respiratory failure with hypoxemia due to COVID-19 pneumonia  Past Medical History  Diabetes mellitus type 2 Obesity Asthma, has been on inhalers in the past, never smoked cigarettes CKD stage III Chronic A. fib History of spine surgery History of Graves' disease, now hypothyroid History of stroke History of DVT Lives in an assisted living facility [facility] Events   July 22 admitted July 23 intubated July 24 prone positioning July 26 subcutaneous air, no pneumothorax, afib with RVR again July 29 decreasing sedation, diuresis July 30 decreasing sedation July 31 holding additional diuresis 8/2 weaning on pressure support 8/5 vent settings down to FiO2 45% and PEEP 5  Consults:  PCCM  Procedures:  7/23 ETT > 7/23 L IJ CVL >8/3 8/3 midline/PIV>   Significant Diagnostic Tests:  7/24 Echo > LVEF > 65%, impaired relaxation, RV normal systolic function, mild thickening of aortic valve 8/4 repeat Echo> LVEF normal > 65%, no clear endocarditis  Micro Data:  July 22 SARS COV 2 Positive 8/2 blood > MRSA 8/4 blood > MRSA 8/6 blood > MRSA 8/9 blood >  Antimicrobials:  July 22 remdesivir> 7/26 July 22 decadron July 22> 23 Toclizumab   Interim history/subjective:   Tolerating PS. Awake and nods head appropriately. Able to to lower extremities on command.  Objective   Blood pressure (!) 147/56, pulse (!) 115, temperature 98.7 F (37.1 C), temperature source Axillary, resp. rate (!) 39, height 5\' 3"  (1.6 m), weight 117.2 kg, SpO2 91 %.    Vent Mode: PCV FiO2 (%):  [50 %] 50 % Set Rate:  [24 bmp] 24 bmp Vt Set:  [360 mL] 360 mL PEEP:  [5 cmH20] 5 cmH20 Pressure Support:  [10 cmH20] 10 cmH20 Plateau Pressure:  [14  cmH20-25 cmH20] 14 cmH20   Intake/Output Summary (Last 24 hours) at 12/06/2018 1700 Last data filed at 12/06/2018 1202 Gross per 24 hour  Intake 1344.02 ml  Output 2400 ml  Net -1055.98 ml   Filed Weights   12/01/18 0500 12/05/18 0500 12/06/18 0500  Weight: 118.3 kg 118.5 kg 117.2 kg   Physical Exam: General: Obese female, laying in bed on vent, no acute distress HENT: NCAT ETT in place PULM: CTA B, vent supported breathing CV: RRR, no mgr GI: BS+, soft, nontender Neuro: Awake, alert, follows commands, moves tongue and head, moves toes, sensation intact, grimaces to painful stimuli GU: Purewick catheter in place  8/8 Improved subcuteanous emphysema. Diffuse bilateral airspace disease  Resolved Hospital Problem list    Assessment & Plan:  Acute respiratory failure with hypoxemia: ARDS due to novel coronavirus infection Pressure support as tolerated When on full vent support via ARDsnet protocol Titrate PEEP and FiO2 for goal SpO2 88-95% or pO2 55-80 Pulmonary hygiene including bronchodilator and flutter valve. AVOID manual percussion. Mobilize as tolerated 8/9: Plan for tracheostomy tomorrow. Will hold anticoagulation transfer 2U platelets tonight  Weakness: ICU-acquired weakness vs ?GBS Appreciate Neurology input. Recommend LP Plan for LP after tracheostomy tomorrow PT evaluation PROM q2h  Septic shock MRSA bacteremia: Possible endocarditis. last + BCx on 8/6  Repeat blood cultures Continue Daptomycin per ID for empiric coverage of possible endocarditis. Monitor CK Appreciate ID input Cardiology evaluated patient for TEE request. Stated TEE would assist in outpatient  management of antibiotics but would not change inpatient management so TEE request was declined. Wean Neo for MAP goal >65 Start midodrine 5 mg TID Hold home anti-hypertensive agents  Ventricular tachycardia as inpatient Atrial fib with RVR  Tele  Transition to PO amio Continue metoprolol and digoxin  On heparin gtt  Need for sedation for mechanical ventilation/dyssynchrony Fentanyl gtt for RASS goal 0 PRN oxycodone Wean benzo  ?enterovsiculofistula CT A/P negative  Pneumomediastinum: stable on 8/3 CXR, 8/7 interval development of SQ air - improved Continue to clinically monitor with prn CXR On minimal PEEP   Best practice:  Diet: Tube feeding Pain/Anxiety/Delirium protocol (if indicated): as above VAP protocol (if indicated): yes DVT prophylaxis: heparin infusion GI prophylaxis: Pantoprazole for stress ulcer prophylaxis Glucose control: SSI Mobility: bed rest Code Status: limited code Family Communication: will discuss with TR[facility] Disposition: remain in ICU  Labs   CBC: Recent Labs  Lab 12/02/18 0510 12/03/18 0500 12/04/18 0500 12/05/18 0500 12/06/18 0453 12/06/18 0517  WBC 6.7 4.0 3.5* 3.3* 2.2*  --   NEUTROABS 5.9 3.2 2.5 2.2 1.4*  --   HGB 8.4* 8.3* 8.6* 8.4* 8.1* 8.5*  HCT 27.6* 28.2* 29.8* 28.7* 26.9* 25.0*  MCV 95.2 96.9 98.3 98.3 97.8  --   PLT 65* 58* 56* 55* 57*  --     Basic Metabolic Panel: Recent Labs  Lab 11/29/18 2230  12/02/18 0510 12/03/18 0500 12/04/18 0500 12/05/18 0500 12/06/18 0453 12/06/18 0517  NA 146*   < > 143 143 143 141 141 140  K 4.5   < > 4.2 4.4 4.3 4.5 3.8 3.6  CL 100   < > 101 102 102 103 100  --   CO2 32   < > 35* 33* 33* 30 31  --   GLUCOSE 214*   < > 181* 177* 135* 114* 122*  --   BUN 91*   < > 85* 70* 47* 40* 36*  --   CREATININE 1.30*   < > 1.13* 0.85 0.75 0.69 0.82  --   CALCIUM 7.7*   < > 7.4* 7.5* 7.5* 7.4* 7.5*  --   MG  --    < > 2.5* 2.5* 2.1 2.0 1.8  --   PHOS 3.2  --   --   --   --   --   --   --    < > = values in this interval not displayed.   GFR: Estimated Creatinine Clearance: 81.2 mL/min (by C-G formula based on SCr of 0.82 mg/dL). Recent Labs  Lab 11/29/18 2255  12/03/18 0500 12/04/18 0500 12/05/18 0500 12/06/18 0453  PROCALCITON 0.26  --   --   --   --   --   WBC  --    < > 4.0 3.5* 3.3*  2.2*   < > = values in this interval not displayed.    Liver Function Tests: Recent Labs  Lab 12/02/18 0510 12/03/18 0500 12/04/18 0500 12/05/18 0500 12/06/18 0453  AST 85* 64* 80* 113* 119*  ALT 97* 100* 99* 112* 109*  ALKPHOS 61 58 63 66 64  BILITOT 1.3* 1.1 1.4* 1.5* 1.6*  PROT 4.7* 4.7* 4.7* 4.6* 4.8*  ALBUMIN 2.6* 2.4* 2.2* 2.1* 2.4*   No results for input(s): LIPASE, AMYLASE in the last 168 hours. No results for input(s): AMMONIA in the last 168 hours.  ABG    Component Value Date/Time   PHART 7.578 (H) 12/06/2018 0517   PCO2ART 35.6 12/06/2018 0517  PO2ART 59.0 (L) 12/06/2018 0517   HCO3 33.1 (H) 12/06/2018 0517   TCO2 34 (H) 12/06/2018 0517   ACIDBASEDEF 3.0 (H) 11/21/2018 0628   O2SAT 93.0 12/06/2018 0517     Coagulation Profile: Recent Labs  Lab 12/06/18 0635  INR 1.1    Cardiac Enzymes: Recent Labs  Lab 12/05/18 1400  CKTOTAL 2,215*    HbA1C: Hgb A1c MFr Bld  Date/Time Value Ref Range Status  11/17/2018 08:00 PM 7.0 (H) 4.8 - 5.6 % Final    Comment:    (NOTE) Pre diabetes:          5.7%-6.4% Diabetes:              >6.4% Glycemic control for   <7.0% adults with diabetes   10/18/2016 10:47 AM 6.0 (H) 4.8 - 5.6 % Final    Comment:    (NOTE)         Pre-diabetes: 5.7 - 6.4         Diabetes: >6.4         Glycemic control for adults with diabetes: <7.0     CBG: Recent Labs  Lab 12/06/18 0035 12/06/18 0333 12/06/18 0745 12/06/18 1204 12/06/18 1551  GLUCAP 110* 101* 115* 204* 140*    Critical care time: 45 minutes    The patient is critically ill with multiple organ systems failure and requires high complexity decision making for assessment and support, frequent evaluation and titration of therapies, application of advanced monitoring technologies and extensive interpretation of multiple databases.   Discussed and co-managed patient care with PCCM-Hospitalist. Coordinated care with RT, RN and pharmacist. Discussed plan for  tracheostomy with Dr. Nelda Marseille to arrange procedure.  Rodman Pickle, M.D. [facility] Pulmonary/Critical Care Medicine 12/06/2018 5:00 PM  Pager: [phone] After hours pager: [phone]

## 2018-12-06 NOTE — Progress Notes (Signed)
Blood Product Released on day shift. Unable to scan  Compare the patient ID on the blood tag to the patient ID on the hospital armband and Blood Bank armband. Then confirm the unit number on the blood tag matches the unit number on the blood product.  If a discrepancy is discovered return the product to blood bank immediately.   Blood Product Type: Platelets  Unit #:  [id number]  Product Code #: E 2800L49   Start Time: 2340  Starting Rate: 172ml/hr  Rate increase/decreased  (if applicable):  179    ml/hr  Rate changed time (if applicable): 1505   Stop Time: 0030  r/t temp 100.10F   All Other Documentation should be documented within the Blood Admin Flowsheet per policy.

## 2018-12-06 NOTE — Progress Notes (Signed)
[place] for Infectious Disease    Date of Admission:  11/26/2018     ID: [name] [name] is a 68 y.o. female with   Active Problems:   Essential hypertension   Acute respiratory failure with hypoxemia (HCC)   PAF (paroxysmal atrial fibrillation) (HCC)   Pneumonia due to COVID-19 virus   SOB (shortness of breath)   Ventricular tachyarrhythmia (HCC)   Intubation of airway performed without difficulty   Subcutaneous emphysema (HCC)   Pneumomediastinum (HCC)   Pressure injury of skin   MRSA bacteremia   Atrial fibrillation, chronic   Anasarca   Acute renal failure superimposed on stage 3 chronic kidney disease ([place])   Controlled type 2 diabetes mellitus with hyperglycemia ([place])   Acute blood loss anemia   Thrombocytopenia (HCC)    24hr:  Continues to have leukopenia at 2.2, hgb 6.0., plt 54. Her baseline CK 2,200 and started first dose of daptomycin yesterday Subjective: afebrile  Medications:  . sodium chloride   Intravenous Once  . amiodarone  200 mg Per Tube BID  . chlorhexidine  15 mL Mouth/Throat BID  . clonazePAM  0.5 mg Per Tube Daily  . digoxin  0.125 mg Intravenous Daily  . etomidate  40 mg Intravenous Once  . feeding supplement (PRO-STAT SUGAR FREE 64)  30 mL Per Tube QID  . feeding supplement (VITAL AF 1.2 CAL)  1,000 mL Per Tube Q24H  . fentaNYL (SUBLIMAZE) injection  200 mcg Intravenous Once  . fentaNYL (SUBLIMAZE) injection  50 mcg Intravenous Once  . ferrous sulfate  300 mg Per Tube TID WC  . free water  300 mL Per Tube Q6H  . gabapentin  100 mg Per Tube Q8H  . insulin aspart  0-20 Units Subcutaneous Q4H  . insulin aspart  10 Units Subcutaneous Q4H  . insulin detemir  50 Units Subcutaneous BID  . levothyroxine  112 mcg Per Tube Q0600  . mouth rinse  15 mL Mouth Rinse 10 times per day  . midazolam  5 mg Intravenous Once  . midodrine  5 mg Per Tube Q8H  . multivitamin with minerals  1 tablet Per Tube Daily  . pantoprazole sodium  40 mg Per Tube  BID  . polyethylene glycol  17 g Per Tube Daily  . propofol  500 mg Intravenous Once  . sodium chloride flush  10-40 mL Intracatheter Q12H  . vecuronium  10 mg Intravenous Once  . vitamin C  500 mg Per Tube Daily  . zinc sulfate  220 mg Per Tube Daily    Objective: Vital signs in last 24 hours: Temp:  [98.5 F (36.9 C)-99.3 F (37.4 C)] 99.2 F (37.3 C) (08/09 0800) Pulse Rate:  [82-119] 89 (08/09 0700) Resp:  [20-39] 25 (08/09 0700) BP: (91-140)/(35-93) 139/65 (08/09 0700) SpO2:  [89 %-100 %] 92 % (08/09 0700) FiO2 (%):  [50 %] 50 % (08/09 0245) Weight:  [117.2 kg] 117.2 kg (08/09 0500)  Did not examine   Lab Results Recent Labs    12/05/18 0500 12/06/18 0453 12/06/18 0517  WBC 3.3* 2.2*  --   HGB 8.4* 8.1* 8.5*  HCT 28.7* 26.9* 25.0*  NA 141 141 140  K 4.5 3.8 3.6  CL 103 100  --   CO2 30 31  --   BUN 40* 36*  --   CREATININE 0.69 0.82  --    Liver Panel Recent Labs    12/05/18 0500 12/06/18 0453  PROT 4.6* 4.8*  ALBUMIN 2.1* 2.4*  AST 113* 119*  ALT 112* 109*  ALKPHOS 66 64  BILITOT 1.5* 1.6*   C-Reactive Protein Recent Labs    12/05/18 0500 12/06/18 0453  CRP 6.1* 5.3*    Microbiology: reviewed Studies/Results: Ct Abdomen Pelvis W Contrast  Result Date: 12/04/2018 CLINICAL DATA:  Enterovesical fistula EXAM: CT ABDOMEN AND PELVIS WITH CONTRAST TECHNIQUE: Multidetector CT imaging of the abdomen and pelvis was performed using the standard protocol following bolus administration of intravenous contrast. CONTRAST:  153mL OMNIPAQUE IOHEXOL 300 MG/ML  SOLN COMPARISON:  None. FINDINGS: Lower chest: There is extensive, dense consolidation and ground-glass opacity in the included bilateral lung bases. Hepatobiliary: No solid liver abnormality is seen. No gallstones, gallbladder wall thickening, or biliary dilatation. Pancreas: Unremarkable. No pancreatic ductal dilatation or surrounding inflammatory changes. Spleen: Normal in size without significant  abnormality. Adrenals/Urinary Tract: Adrenal glands are unremarkable. Kidneys are normal, without renal calculi, solid lesion, or hydronephrosis. Thickening of the urinary bladder. Stomach/Bowel: Stomach is within normal limits. Weighted enteric feeding tube is positioned with tip in the transverse portion of the duodenum. Appendix appears normal. No evidence of bowel wall thickening, distention, or inflammatory changes. The colon is fluid-filled to the rectum and a rectal tube is in place. Vascular/Lymphatic: Aortic atherosclerosis. No enlarged abdominal or pelvic lymph nodes. Reproductive: No mass or other significant abnormality. Other: No abdominal wall hernia or abnormality. Trace ascites in the abdomen and pelvis. Musculoskeletal: No acute or significant osseous findings. IMPRESSION: 1. Nonspecific thickening of the urinary bladder. There is no evidence of contrast fistulation from the urinary bladder to the bowel on delayed urographic contrast phase, however this does not exclude the presence of enterovesical fistula. There is no air within the urinary bladder, which is highly characteristic of fistula. More definitive evaluation will require fluoroscopic cystogram and/or contrast enema when clinically appropriate. 2. The colon is fluid-filled to the rectum, in keeping with diarrheal illness, and a rectal tube is in place. 3.  Trace nonspecific ascites in the abdomen and pelvis. 4. There is extensive, dense consolidation and ground-glass opacity in the included bilateral lung bases. Electronically Signed   By: Eddie Candle M.D.   On: 12/04/2018 16:32   Dg Chest Port 1 View  Result Date: 12/05/2018 CLINICAL DATA:  Acute respiratory failure EXAM: PORTABLE CHEST 1 VIEW COMPARISON:  12/03/2018 chest radiograph. FINDINGS: Endotracheal tube tip is 3.3 cm above the carina. Enteric tube enters stomach with the tip not seen on this image. Stable cardiomediastinal silhouette with normal heart size. No pneumothorax.  No pleural effusion. Extensive patchy consolidation in both lungs, most prominent in the lower lungs, minimally improved on the left. IMPRESSION: 1. Well-positioned support structures. 2. Extensive patchy bilateral lung consolidation, most prominent in the lower lungs, minimally improved on the left, compatible with multilobar pneumonia. Electronically Signed   By: Ilona Sorrel M.D.   On: 12/05/2018 09:26     Assessment/Plan: mrsa bacteremia = will continue on daptomycin 8mg /kg/day. Despite ck very elevated at baseline. Will re-check CK on Tuesday to see trends to decide if continues to trend up. Will continue with daptomycin for the moment. If her CK continues to trend up, then we will switch to ceftaroline  Leukopenia/thrombocytopenia = thought to be drug side effect from vancomycin. Should correct if related to vancomycin  covid related severe respiratory distress = continue with vent support.  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 Recommended LP  [facility] for Infectious Diseases Cell: [phone]  Pager: [phone]  12/06/2018, 12:15 PM

## 2018-12-06 NOTE — Progress Notes (Signed)
CBG rechecked and is 110.

## 2018-12-06 NOTE — Progress Notes (Signed)
FaceTime opportunity provided for patient's family. Questions were encouraged and answered. Daughter-in-law. [name] [name] was very pleased and thankful.

## 2018-12-06 NOTE — Progress Notes (Addendum)
PROGRESS NOTE    [name] [name]  MCN:[id number] DOB: Sep 15, 1950 DOA: 11/17/2018 PCP: Nicholos Johns, MD   Brief Narrative:  68 y.o.BF PMHx morbid obesity, DM type II, COPD not on oxygen, HTN, chronic atrial fibrillation CHAD2VASc~4, on Xarelto and Cardizem,spine surgeries in the past,chronic fluid retention, history of Graves' disease now hypothyroid, history of CVA, history of DVT who lives at [facility] and was likely exposed to COVID-19 infection about 10 to 14 days prior to this admission.     She was experiencing fever, body aches and developed shortness of breath.  She finally decided to come into the emergency department at [facility].  She was found to be positive for COVID-19.  Was noted to have hypoxia.  She was hospitalized for further management.    Subjective: 8/9 alert nods yes/no to questions wiggles her toes to command.  Unable to move upper extremity.    Assessment & Plan:   Active Problems:   Essential hypertension   Acute respiratory failure with hypoxemia (HCC)   PAF (paroxysmal atrial fibrillation) (HCC)   Pneumonia due to COVID-19 virus   SOB (shortness of breath)   Ventricular tachyarrhythmia (HCC)   Intubation of airway performed without difficulty   Subcutaneous emphysema (HCC)   Pneumomediastinum (HCC)   Pressure injury of skin   MRSA bacteremia   Atrial fibrillation, chronic   Anasarca   Acute renal failure superimposed on stage 3 chronic kidney disease ([place])   Controlled type 2 diabetes mellitus with hyperglycemia (HCC)   Acute blood loss anemia   Thrombocytopenia (HCC)  Acute respiratory failure with hypoxia/COVID 19 pneumonia/ARDS -Completed course Remdesivir -Dexamethasone started 7/22 6 mg daily tapered and stopped at 14 days. -Actemra x2 doses 7/22 and 7/23 -Continue vitamins per COVID protocol -Afebrile overnight - 8/6 PCXR shows worsening pneumonia see results below - 8/8 continue daily weaning trials - 8/8 patient now  off continuous Versed, Precedex, will wean patient off of clonazepam. -Fentanyl as needed,e Vrsed PRN, oxycodone IR as needed -8/8 wean patient off of clonazepam  Recent Labs  Lab 12/02/18 0510 12/03/18 0500 12/04/18 0500 12/05/18 0500 12/06/18 0453  CRP 1.6* 1.1* 1.7* 6.1* 5.3*   Recent Labs  Lab 12/02/18 0510 12/03/18 0500 12/04/18 0500 12/05/18 0500 12/06/18 0635  DDIMER 3.81* 6.55* 17.45* 18.07* 12.97*    Asthma vs COPD - See acute respiratory failure  Subcutaneous emphysema/pneumomediastinum - 7/26 diagnosis subcutaneous emphysema.  CXR also suggested mediastinal emphysema.  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  Stable   Bilateral hemiparalysis - Patient unable to move bilateral upper extremities or bilateral lower extremities.  States she can feel when she is being touched on her extremities but does not register pain. - Neurology; recommends LP, his DDX critical care myopathy vs neuropathy, vs Gilliam Barr syndrome vs infection -Obtain LP post placement tracheostomy tube  MRSA bacteremia -Central/PIC line removed -Patient placed on appropriate antibiotics -Patient will receive 24 to 48-hour line holiday - Blood culture NGTD see below - 7/24 echocardiogram negative vegetation.  However patient diagnosed with MRSA bacteremia on 8/2 repeat echocardiogram most likely will be required.  Would prefer TEE.  Will need to discuss with cardiology - 8/2 repeat blood cultures positive staph aureus speciation and susceptibilities pending. -8/7 discussed case with cardiology recommend empiric coverage for possible endocarditis.  Will not  perform TEE at this time as patient has COVID and procedure is an aerosol lysing procedure. -8/7 discussed case with Dr. Carlyle Basques, ID concerning restarting antibiotics.  Does not believe patient is a vancomycin failure therefore recommends restarting vancomycin for minimum of 4 weeks treatment. -8/8 Dr. Carlyle Basques ID has changed vancomycin---> daptomycin for a 4 to 6-week course of antibiotics. - Recommend at day 21 again request that cardiology performed TEE. - Despite elevation in CK ID recommends continuation of daptomycin  Elevated CK -Monitor closely Recent Labs  Lab 12/05/18 1400  CKTOTAL 2,215*   Enterovesical Fistula ? -Some concern that patient may have a fistula, but I am not fully convinced patient has such fistula.  Usually patient would have a significant leukocytosis, fever etc.  But given that she has been on immunosuppressive medication we will proceed as if fistula possible. - Obtain urinalysis - Obtain urine culture - 8/7 discussed case with radiology they recommended CT abdomen/pelvis with oral and IV contrast. -Dependent upon findings consult GI or GYN.  -8/8 CT abdomen with: Inconclusive, see results below will consult GI on 8/9 discussed case going forward.  Chronic A. fib with episodes of V. tach - At home was treated with Xarelto -Amiodarone drip -Digoxin 0.125 mg daily -Metoprolol 2.5 mg QID -Metoprolol PRN - Currently rate controlled - 8/5 all anticoagulants held currently.  Discussed case with RN and patient is continuing to bleed, pure wick looked like a tampon when it was changed out.  Will reassess starting anticoagulant in next 24 to 48 hours. - Patient also thrombocytopenic if she continues to bleed will transfuse unit of platelets.  -Platelets low normal prior to admission -CHADS2VASc score~3 -8/6 bleeding has stabilized continue to hold all anticoagulants for another 24 hours will then make decision on whether to restart. 8/9 continue to hold heparin drip preparation for tracheostomy/LP on 8/10   Hypotension - Midodrine 5 mg 3 times daily -Discontinue scheduled  metoprolol  Hypokalemia -Potassium goal> 4 - Potassium p.o. 40 mEq  Hypomagnesmia -Magnesium goal> 2 - Magnesium IV 1 g   Anasarca. -Appears to be third spacing with fluid. -Strict in and out +10.7 L -Daily weight Filed Weights   12/01/18 0500 12/05/18 0500 12/06/18 0500  Weight: 118.3 kg 118.5 kg 117.2 kg  -q4hr CVP -8/8 Albumin 50 g + Lasix 60 mg.  Continue PRN albumin+ Lasix  Acute on CKD stage III ?  (Cr 12/25/2016 0.90) -Unknown baseline renal function -Baseline renal function not entirely known.  Recent Labs  Lab 12/02/18 0510 12/03/18 0500 12/04/18 0500 12/05/18 0500 12/06/18 0453  CREATININE 1.13* 0.85 0.75 0.69 0.82  -Improved  Hypothyroidism -Continue levothyroxine.   -As noted above her TSH was noted to be low.  Free T4 is 1.0.  No further work-up.  Low TSH likely sick euthyroid.  Hx stroke  -Patient was on Xarelto and aspirin, have been switched over to IV heparin however given her ongoing bleeding all anticoagulants on hold.   History of vitamin D deficiency Continue supplementation.  Hx DVT -Xarelto on hold secondary to bleeding   Diabetes type 2 controlled with hyperglycemia -7/22 hemoglobin A1c = 7.0  - Levemir 50 units twice daily - NovoLog 10 units Q 4 hours - Resistant SSI  Morbid obesity Body mass index is 46.2 kg/m.   Nutrition Continue tube feedings.  Constipation resolved Continue bowel regimen.  Mildly elevated AST. Monitor for now.  Acute blood loss anemia/Bleeding from oral cavity/melena -Patient had been on Xarelto and  heparin all anticoagulants discontinued -Resolved  -Hemoglobin on admission was 12.6. Recent Labs  Lab 12/03/18 0500 12/04/18 0500 12/05/18 0500 12/06/18 0453 12/06/18 0517  HGB 8.3* 8.6* 8.4* 8.1* 8.5*  - Transfuse for hemoglobin<7 - Monitor closely now that heparin has been restarted.  Acute thrombocytopenia - Most likely secondary to MRSA bacteremia -HIT panel negative   -Transfused 2 units of platelets 8/9 @2300   Leukocytosis - Most likely secondary to infection - We will type and cross      DVT prophylaxis: Heparin  Code Status: Partial Family Communication: Spoke with Marciano Sequin daughter/Laverne daughter-in-law/son; discussed plan of care answered all questions.  They agreed that placement of a tracheostomy tube would be in patient's best interest.  Understood placement would occur sometime next week and that they were received a call prior to surgery. ADDENDUM; spoke with Marciano Sequin daughter informed her that tracheostomy and LP would occur tomorrow~1500 answered all questions. Disposition Plan: TBD   Consultants:  PCCM Cardiology phone consult Dr. Buford Dresser Neurology Dr. Kathrynn Speed    Procedures/Significant Events:  7/23 intubation >Central line insertion 11/19/2018 >Cortrack replacement 11/25/2018 >Transthoracic echocardiogram 7/24 1. The left ventricle has hyperdynamic systolic function, with an ejection fraction of >65%. The cavity size was normal. Left ventricular diastolic Doppler parameters are consistent with impaired relaxation. 2. The right ventricle has normal systolc function. The cavity was normal. There is no increase in right ventricular wall thickness. Right ventricular systolic pressure is normal. 3. Mild thickening of the aortic valve. No stenosis of the aortic valve. 4. The aortic root and ascending aorta are normal in size and structure. > Limited echocardiogram 12/01/2018 no vegetation. 8/6 PCXR: New LEFT supra clavicular soft tissue emphysema, no visible pneumothorax - Worsening pneumonia, chronically elevated RIGHT diaphragm 8/7 CT abdomen pelvis W contrast:Nonspecific thickening of the urinary bladder.-no evidence of contrast fistulation from the urinary bladder to the bowel on delayed urographic contrast phase, however this does not exclude the presence of enterovesical fistula. There is no air  within the urinary bladder, which is highly characteristic of fistula. More definitive evaluation will require fluoroscopic cystogram and/or contrast enema when clinically appropriate. -colon is fluid-filled to the rectum, in keeping with diarrheal illness, and a rectal tube is in place. -extensive, dense consolidation and ground-glass opacity in the included bilateral lung bases.    I have personally reviewed and interpreted all radiology studies and my findings are as above.  VENTILATOR SETTINGS:    Cultures 8/4 blood RIGHT handx2 positive staph aureus 8/6 blood pending      Antimicrobials: Anti-infectives (From admission, onward)   Start     Stop   12/06/18 0000  vancomycin (VANCOCIN) 1,250 mg in sodium chloride 0.9 % 250 mL IVPB  Status:  Discontinued     12/05/18 1317   12/05/18 2000  DAPTOmycin (CUBICIN) 948 mg in sodium chloride 0.9 % IVPB         12/04/18 0000  vancomycin (VANCOCIN) IVPB 1000 mg/200 mL premix  Status:  Discontinued     12/05/18 1238   12/02/18 1400  vancomycin (VANCOCIN) 1,750 mg in sodium chloride 0.9 % 500 mL IVPB  Status:  Discontinued     12/02/18 1151   12/02/18 1300  vancomycin (VANCOCIN) IVPB 1000 mg/200 mL premix  Status:  Discontinued     12/03/18 1505   11/30/18 1430  vancomycin (VANCOCIN) 2,500 mg in sodium chloride 0.9 % 500 mL IVPB     11/30/18 1711   11/19/18 2030  remdesivir 100  mg in sodium chloride 0.9 % 250 mL IVPB     11/22/18 2204   10/31/2018 2030  remdesivir 200 mg in sodium chloride 0.9 % 250 mL IVPB     11/01/2018 2056        Devices    LINES / TUBES:      Continuous Infusions:  sodium chloride Stopped (12/01/18 1300)   amiodarone 30 mg/hr (12/06/18 0700)   DAPTOmycin (CUBICIN)  IV Stopped (12/05/18 2222)   fentaNYL infusion INTRAVENOUS 50 mcg/hr (12/06/18 0700)   phenylephrine (NEO-SYNEPHRINE) Adult infusion 25 mcg/min (12/06/18 0700)     Objective: Vitals:   12/06/18 0620 12/06/18 0630 12/06/18 0645  12/06/18 0700  BP: (!) 139/58 (!) 137/54 (!) 139/58 139/65  Pulse: 90 87 88 89  Resp: (!) 25 (!) 31 (!) 29 (!) 25  Temp:      TempSrc:      SpO2: 90% 93% 92% 92%  Weight:      Height:        Intake/Output Summary (Last 24 hours) at 12/06/2018 0818 Last data filed at 12/06/2018 0700 Gross per 24 hour  Intake 1828.89 ml  Output 2600 ml  Net -771.11 ml   Filed Weights   12/01/18 0500 12/05/18 0500 12/06/18 0500  Weight: 118.3 kg 118.5 kg 117.2 kg   Physical Exam:  General: Alert nods her head yes and no appropriately to questions.  Wiggles her toes.  Positive acute respiratory distress Eyes: negative scleral hemorrhage, negative anisocoria, negative icterus ENT: Negative Runny nose, negative gingival bleeding, #7.5 cuffed ETT tube in place negative sign of infection or bleeding Neck:  Negative scars, masses, torticollis, lymphadenopathy, JVD Lungs: Tachypneic clear to auscultation bilaterally without wheezes or crackles Cardiovascular: Regular rate and rhythm without murmur gallop or rub normal S1 and S2 Abdomen: Obese, negative abdominal pain, nondistended, positive soft, bowel sounds, no rebound, no ascites, no appreciable mass Extremities: No significant cyanosis, clubbing, or edema bilateral lower extremities Skin: Negative rashes, lesions, ulcers Psychiatric: Unable to fully evaluate secondary to intubation   Central nervous system: Alert nods appropriately yes and no to questions, wiggles her toes    .     Data Reviewed: Care during the described time interval was provided by me .  I have reviewed this patient's available data, including medical history, events of note, physical examination, and all test results as part of my evaluation.   CBC: Recent Labs  Lab 12/02/18 0510 12/03/18 0500 12/04/18 0500 12/05/18 0500 12/06/18 0453 12/06/18 0517  WBC 6.7 4.0 3.5* 3.3* 2.2*  --   NEUTROABS 5.9 3.2 2.5 2.2 1.4*  --   HGB 8.4* 8.3* 8.6* 8.4* 8.1* 8.5*  HCT 27.6*  28.2* 29.8* 28.7* 26.9* 25.0*  MCV 95.2 96.9 98.3 98.3 97.8  --   PLT 65* 58* 56* 55* 57*  --    Basic Metabolic Panel: Recent Labs  Lab 11/29/18 2230  12/02/18 0510 12/03/18 0500 12/04/18 0500 12/05/18 0500 12/06/18 0453 12/06/18 0517  NA 146*   < > 143 143 143 141 141 140  K 4.5   < > 4.2 4.4 4.3 4.5 3.8 3.6  CL 100   < > 101 102 102 103 100  --   CO2 32   < > 35* 33* 33* 30 31  --   GLUCOSE 214*   < > 181* 177* 135* 114* 122*  --   BUN 91*   < > 85* 70* 47* 40* 36*  --   CREATININE 1.30*   < >  1.13* 0.85 0.75 0.69 0.82  --   CALCIUM 7.7*   < > 7.4* 7.5* 7.5* 7.4* 7.5*  --   MG  --    < > 2.5* 2.5* 2.1 2.0 1.8  --   PHOS 3.2  --   --   --   --   --   --   --    < > = values in this interval not displayed.   GFR: Estimated Creatinine Clearance: 81.2 mL/min (by C-G formula based on SCr of 0.82 mg/dL). Liver Function Tests: Recent Labs  Lab 12/02/18 0510 12/03/18 0500 12/04/18 0500 12/05/18 0500 12/06/18 0453  AST 85* 64* 80* 113* 119*  ALT 97* 100* 99* 112* 109*  ALKPHOS 61 58 63 66 64  BILITOT 1.3* 1.1 1.4* 1.5* 1.6*  PROT 4.7* 4.7* 4.7* 4.6* 4.8*  ALBUMIN 2.6* 2.4* 2.2* 2.1* 2.4*   No results for input(s): LIPASE, AMYLASE in the last 168 hours. No results for input(s): AMMONIA in the last 168 hours. Coagulation Profile: Recent Labs  Lab 12/06/18 0635  INR 1.1   Cardiac Enzymes: Recent Labs  Lab 12/05/18 1400  CKTOTAL 2,215*   BNP (last 3 results) No results for input(s): PROBNP in the last 8760 hours. HbA1C: No results for input(s): HGBA1C in the last 72 hours. CBG: Recent Labs  Lab 12/05/18 1659 12/05/18 2048 12/06/18 0035 12/06/18 0333 12/06/18 0745  GLUCAP 153* 105* 110* 101* 115*   Lipid Profile: No results for input(s): CHOL, HDL, LDLCALC, TRIG, CHOLHDL, LDLDIRECT in the last 72 hours. Thyroid Function Tests: Recent Labs    12/05/18 0500  TSH 6.174*  FREET4 0.65   Anemia Panel: No results for input(s): VITAMINB12, FOLATE,  FERRITIN, TIBC, IRON, RETICCTPCT in the last 72 hours. Urine analysis:    Component Value Date/Time   COLORURINE YELLOW 11/30/2018 0400   APPEARANCEUR HAZY (A) 11/30/2018 0400   LABSPEC <1.005 (L) 11/30/2018 0400   PHURINE >9.0 (H) 11/30/2018 0400   GLUCOSEU NEGATIVE 11/30/2018 0400   HGBUR SMALL (A) 11/30/2018 0400   BILIRUBINUR NEGATIVE 11/30/2018 0400   KETONESUR NEGATIVE 11/30/2018 0400   PROTEINUR TRACE (A) 11/30/2018 0400   UROBILINOGEN 1.0 06/20/2014 0442   NITRITE NEGATIVE 11/30/2018 0400   LEUKOCYTESUR SMALL (A) 11/30/2018 0400   Sepsis Labs: @LABRCNTIP (procalcitonin:4,lacticidven:4)  ) Recent Results (from the past 240 hour(s))  Culture, blood (routine x 2)     Status: Abnormal   Collection Time: 11/29/18 10:55 PM   Specimen: BLOOD  Result Value Ref Range Status   Specimen Description BLOOD RIGHT ANTECUBITAL  Final   Special Requests   Final    BOTTLES DRAWN AEROBIC AND ANAEROBIC Blood Culture adequate volume   Culture  Setup Time   Final    GRAM POSITIVE COCCI IN CLUSTERS IN BOTH AEROBIC AND ANAEROBIC BOTTLES CRITICAL RESULT CALLED TO, READ BACK BY AND VERIFIED WITH: [facility] CAREN AMEND [id number] FCP Performed at [facility], [place] [address] [place], [place] [address]    Culture METHICILLIN RESISTANT STAPHYLOCOCCUS AUREUS (A)  Final   Report Status 12/02/2018 FINAL  Final   Organism ID, Bacteria METHICILLIN RESISTANT STAPHYLOCOCCUS AUREUS  Final      Susceptibility   Methicillin resistant staphylococcus aureus - MIC*    CIPROFLOXACIN <=0.5 SENSITIVE Sensitive     ERYTHROMYCIN >=8 RESISTANT Resistant     GENTAMICIN <=0.5 SENSITIVE Sensitive     OXACILLIN >=4 RESISTANT Resistant     TETRACYCLINE <=1 SENSITIVE Sensitive     VANCOMYCIN 1  SENSITIVE Sensitive     TRIMETH/SULFA <=10 SENSITIVE Sensitive     CLINDAMYCIN <=0.25 SENSITIVE Sensitive     RIFAMPIN <=0.5 SENSITIVE Sensitive     Inducible Clindamycin NEGATIVE Sensitive     * METHICILLIN RESISTANT  STAPHYLOCOCCUS AUREUS  Blood Culture ID Panel (Reflexed)     Status: Abnormal   Collection Time: 11/29/18 10:55 PM  Result Value Ref Range Status   Enterococcus species NOT DETECTED NOT DETECTED Final   Listeria monocytogenes NOT DETECTED NOT DETECTED Final   Staphylococcus species DETECTED (A) NOT DETECTED Final    Comment: CRITICAL RESULT CALLED TO, READ BACK BY AND VERIFIED WITH: [facility] CAREN AMEND [id number] FCP    Staphylococcus aureus (BCID) DETECTED (A) NOT DETECTED Final    Comment: Methicillin (oxacillin)-resistant Staphylococcus aureus (MRSA). MRSA is predictably resistant to beta-lactam antibiotics (except ceftaroline). Preferred therapy is vancomycin unless clinically contraindicated. Patient requires contact precautions if  hospitalized. CRITICAL RESULT CALLED TO, READ BACK BY AND VERIFIED WITH: PHARMD CAREN AMEND [id number] FCP    Methicillin resistance DETECTED (A) NOT DETECTED Final    Comment: CRITICAL RESULT CALLED TO, READ BACK BY AND VERIFIED WITH: PHAR[facility] CAREN AMEND [id number] FCP    Streptococcus species NOT DETECTED NOT DETECTED Final   Streptococcus agalactiae NOT DETECTED NOT DETECTED Final   Streptococcus pneumoniae NOT DETECTED NOT DETECTED Final   Streptococcus pyogenes NOT DETECTED NOT DETECTED Final   Acinetobacter baumannii NOT DETECTED NOT DETECTED Final   Enterobacteriaceae species NOT DETECTED NOT DETECTED Final   Enterobacter cloacae complex NOT DETECTED NOT DETECTED Final   Escherichia coli NOT DETECTED NOT DETECTED Final   Klebsiella oxytoca NOT DETECTED NOT DETECTED Final   Klebsiella pneumoniae NOT DETECTED NOT DETECTED Final   Proteus species NOT DETECTED NOT DETECTED Final   Serratia marcescens NOT DETECTED NOT DETECTED Final   Haemophilus influenzae NOT DETECTED NOT DETECTED Final   Neisseria meningitidis NOT DETECTED NOT DETECTED Final   Pseudomonas aeruginosa NOT DETECTED NOT DETECTED Final   Candida albicans NOT DETECTED NOT DETECTED  Final   Candida glabrata NOT DETECTED NOT DETECTED Final   Candida krusei NOT DETECTED NOT DETECTED Final   Candida parapsilosis NOT DETECTED NOT DETECTED Final   Candida tropicalis NOT DETECTED NOT DETECTED Final    Comment: Performed at [facility], [place]. [address] [place], [place] [address]  Culture, blood (routine x 2)     Status: Abnormal   Collection Time: 11/29/18 11:05 PM   Specimen: BLOOD RIGHT HAND  Result Value Ref Range Status   Specimen Description BLOOD RIGHT HAND  Final   Special Requests   Final    BOTTLES DRAWN AEROBIC AND ANAEROBIC Blood Culture adequate volume   Culture  Setup Time   Final    GRAM POSITIVE COCCI IN BOTH AEROBIC AND ANAEROBIC BOTTLES CRITICAL VALUE NOTED.  VALUE IS CONSISTENT WITH PREVIOUSLY REPORTED AND CALLED VALUE.    Culture (A)  Final    STAPHYLOCOCCUS AUREUS SUSCEPTIBILITIES PERFORMED ON PREVIOUS CULTURE WITHIN THE LAST 5 DAYS. Performed at [facility], [place] [address] [place], [place] [address]    Report Status 12/02/2018 FINAL  Final  Culture, blood (Routine X 2) w Reflex to ID Panel     Status: Abnormal   Collection Time: 12/01/18  2:14 PM   Specimen: BLOOD RIGHT HAND  Result Value Ref Range Status   Specimen Description   Final    BLOOD RIGHT HAND Performed at [facility], [place]  [address] [place], [place] [address]    Special Requests   Final    BOTTLES DRAWN AEROBIC AND ANAEROBIC Blood Culture adequate volume Performed at [facility] [address], [place], [place] [address]    Culture  Setup Time   Final    GRAM POSITIVE COCCI ANAEROBIC BOTTLE ONLY CRITICAL VALUE NOTED.  VALUE IS CONSISTENT WITH PREVIOUSLY REPORTED AND CALLED VALUE.    Culture (A)  Final    STAPHYLOCOCCUS AUREUS SUSCEPTIBILITIES PERFORMED ON PREVIOUS CULTURE WITHIN THE LAST 5 DAYS. Performed at [facility], [place] [address], [place], [place] [address]    Report Status 12/04/2018 FINAL  Final  Culture, blood  (Routine X 2) w Reflex to ID Panel     Status: Abnormal   Collection Time: 12/01/18  2:20 PM   Specimen: BLOOD  Result Value Ref Range Status   Specimen Description   Final    BLOOD RIGHT HAND Performed at [facility] [address] [place], [place] [address]    Special Requests   Final    BOTTLES DRAWN AEROBIC AND ANAEROBIC Blood Culture adequate volume Performed at [facility] [address], [place], [place] [address]    Culture  Setup Time   Final    IN BOTH AEROBIC AND ANAEROBIC BOTTLES GRAM POSITIVE COCCI CRITICAL VALUE NOTED.  VALUE IS CONSISTENT WITH PREVIOUSLY REPORTED AND CALLED VALUE.    Culture (A)  Final    STAPHYLOCOCCUS AUREUS SUSCEPTIBILITIES PERFORMED ON PREVIOUS CULTURE WITHIN THE LAST 5 DAYS. Performed at [facility], [place] [address], [place], [place] [address]    Report Status 12/04/2018 FINAL  Final  Culture, blood (Routine X 2) w Reflex to ID Panel     Status: None (Preliminary result)   Collection Time: 12/03/18  4:16 PM   Specimen: BLOOD  Result Value Ref Range Status   Specimen Description   Final    BLOOD RIGHT ARM Performed at [facility] [address] [place], [place] [address]    Special Requests   Final    BOTTLES DRAWN AEROBIC AND ANAEROBIC Blood Culture adequate volume Performed at [facility] [address], [place], [place] [address]    Culture  Setup Time   Final    GRAM POSITIVE COCCI ANAEROBIC BOTTLE ONLY CRITICAL VALUE NOTED.  VALUE IS CONSISTENT WITH PREVIOUSLY REPORTED AND CALLED VALUE.    Culture   Final    CULTURE REINCUBATED FOR BETTER GROWTH Performed at [facility], [place] [address], [place], [place] [address]    Report Status PENDING  Incomplete  Culture, blood (Routine X 2) w Reflex to ID Panel     Status: None (Preliminary result)   Collection Time: 12/03/18  4:29 PM   Specimen: BLOOD  Result Value Ref Range Status   Specimen  Description   Final    BLOOD RIGHT ARM Performed at [facility] [address] [place], [place] [address]    Special Requests   Final    BOTTLES DRAWN AEROBIC AND ANAEROBIC Blood Culture adequate volume Performed at [facility] [address], [place],  [address]    Culture   Final    NO GROWTH 2 DAYS Performed at [facility] [address] [place],  [address]    Report Status PENDING  Incomplete         Radiology Studies: Ct Abdomen Pelvis W Contrast  Result Date: 12/04/2018 CLINICAL DATA:  Enterovesical fistula EXAM: CT ABDOMEN AND PELVIS WITH CONTRAST TECHNIQUE: Multidetector CT imaging of the abdomen and pelvis was performed using the standard protocol following bolus administration of intravenous contrast. CONTRAST:  14mL OMNIPAQUE IOHEXOL 300 MG/ML  SOLN COMPARISON:  None. FINDINGS: Lower chest: There is extensive, dense consolidation and ground-glass opacity in the included bilateral lung bases. Hepatobiliary: No solid liver abnormality is seen. No gallstones, gallbladder wall thickening, or biliary dilatation. Pancreas: Unremarkable. No pancreatic ductal dilatation or surrounding inflammatory changes. Spleen: Normal in size without significant abnormality. Adrenals/Urinary Tract: Adrenal glands are unremarkable. Kidneys are normal, without renal calculi, solid lesion, or hydronephrosis. Thickening of the urinary bladder. Stomach/Bowel: Stomach is within normal limits. Weighted enteric feeding tube is positioned with tip in the transverse portion of the duodenum. Appendix appears normal. No evidence of bowel wall thickening, distention, or inflammatory changes. The colon is fluid-filled to the rectum and a rectal tube is in place. Vascular/Lymphatic: Aortic atherosclerosis. No enlarged abdominal or pelvic lymph nodes. Reproductive: No mass or other significant abnormality. Other: No abdominal wall hernia or abnormality.  Trace ascites in the abdomen and pelvis. Musculoskeletal: No acute or significant osseous findings. IMPRESSION: 1. Nonspecific thickening of the urinary bladder. There is no evidence of contrast fistulation from the urinary bladder to the bowel on delayed urographic contrast phase, however this does not exclude the presence of enterovesical fistula. There is no air within the urinary bladder, which is highly characteristic of fistula. More definitive evaluation will require fluoroscopic cystogram and/or contrast enema when clinically appropriate. 2. The colon is fluid-filled to the rectum, in keeping with diarrheal illness, and a rectal tube is in place. 3.  Trace nonspecific ascites in the abdomen and pelvis. 4. There is extensive, dense consolidation and ground-glass opacity in the included bilateral lung bases. Electronically Signed   By: Eddie Candle M.D.   On: 12/04/2018 16:32   Dg Chest Port 1 View  Result Date: 12/05/2018 CLINICAL DATA:  Acute respiratory failure EXAM: PORTABLE CHEST 1 VIEW COMPARISON:  12/03/2018 chest radiograph. FINDINGS: Endotracheal tube tip is 3.3 cm above the carina. Enteric tube enters stomach with the tip not seen on this image. Stable cardiomediastinal silhouette with normal heart size. No pneumothorax. No pleural effusion. Extensive patchy consolidation in both lungs, most prominent in the lower lungs, minimally improved on the left. IMPRESSION: 1. Well-positioned support structures. 2. Extensive patchy bilateral lung consolidation, most prominent in the lower lungs, minimally improved on the left, compatible with multilobar pneumonia. Electronically Signed   By: Ilona Sorrel M.D.   On: 12/05/2018 09:26        Scheduled Meds:  sodium chloride   Intravenous Once   sodium chloride   Intravenous Once   chlorhexidine  15 mL Mouth/Throat BID   clonazePAM  0.5 mg Per Tube Daily   dextrose       digoxin  0.125 mg Intravenous Daily   etomidate  40 mg Intravenous  Once   feeding supplement (PRO-STAT SUGAR FREE 64)  30 mL Per Tube QID   feeding supplement (VITAL AF 1.2 CAL)  1,000 mL Per Tube Q24H   fentaNYL (SUBLIMAZE) injection  200 mcg Intravenous Once   fentaNYL (SUBLIMAZE) injection  50 mcg Intravenous Once   ferrous sulfate  300 mg Per Tube TID WC   free water  300 mL Per Tube Q6H   gabapentin  100 mg Per Tube Q8H   insulin aspart  0-20 Units Subcutaneous Q4H  insulin aspart  10 Units Subcutaneous Q4H   insulin detemir  50 Units Subcutaneous BID   levothyroxine  112 mcg Per Tube Q0600   mouth rinse  15 mL Mouth Rinse 10 times per day   metoprolol tartrate  2.5 mg Intravenous Q6H   midazolam  5 mg Intravenous Once   multivitamin with minerals  1 tablet Per Tube Daily   pantoprazole sodium  40 mg Per Tube BID   polyethylene glycol  17 g Per Tube Daily   propofol  500 mg Intravenous Once   sodium chloride flush  10-40 mL Intracatheter Q12H   vecuronium  10 mg Intravenous Once   vitamin C  500 mg Per Tube Daily   zinc sulfate  220 mg Per Tube Daily   Continuous Infusions:  sodium chloride Stopped (12/01/18 1300)   amiodarone 30 mg/hr (12/06/18 0700)   DAPTOmycin (CUBICIN)  IV Stopped (12/05/18 2222)   fentaNYL infusion INTRAVENOUS 50 mcg/hr (12/06/18 0700)   phenylephrine (NEO-SYNEPHRINE) Adult infusion 25 mcg/min (12/06/18 0700)     LOS: 18 days   The patient is critically ill with multiple organ systems failure and requires high complexity decision making for assessment and support, frequent evaluation and titration of therapies, application of advanced monitoring technologies and extensive interpretation of multiple databases. Critical Care Time devoted to patient care services described in this note  Time spent: 40 minutes     Alianna Wurster, Geraldo Docker, MD [facility] Pager [phone]  If 7PM-7AM, please contact night-coverage [email or web address] Password [email or web address] 12/06/2018, 8:18 AM

## 2018-12-07 ENCOUNTER — Inpatient Hospital Stay (HOSPITAL_COMMUNITY): Payer: Medicare Other

## 2018-12-07 ENCOUNTER — Other Ambulatory Visit: Payer: Self-pay | Admitting: *Deleted

## 2018-12-07 DIAGNOSIS — E118 Type 2 diabetes mellitus with unspecified complications: Secondary | ICD-10-CM

## 2018-12-07 LAB — GLUCOSE, CAPILLARY
Glucose-Capillary: 100 mg/dL — ABNORMAL HIGH (ref 70–99)
Glucose-Capillary: 155 mg/dL — ABNORMAL HIGH (ref 70–99)
Glucose-Capillary: 158 mg/dL — ABNORMAL HIGH (ref 70–99)
Glucose-Capillary: 173 mg/dL — ABNORMAL HIGH (ref 70–99)
Glucose-Capillary: 50 mg/dL — ABNORMAL LOW (ref 70–99)
Glucose-Capillary: 56 mg/dL — ABNORMAL LOW (ref 70–99)
Glucose-Capillary: 64 mg/dL — ABNORMAL LOW (ref 70–99)
Glucose-Capillary: 80 mg/dL (ref 70–99)
Glucose-Capillary: 81 mg/dL (ref 70–99)
Glucose-Capillary: 83 mg/dL (ref 70–99)
Glucose-Capillary: 97 mg/dL (ref 70–99)

## 2018-12-07 LAB — BPAM PLATELET PHERESIS
Blood Product Expiration Date: 202008112359
Blood Product Expiration Date: 202008112359
ISSUE DATE / TIME: 202008092207
ISSUE DATE / TIME: 202008092207
Unit Type and Rh: 5100
Unit Type and Rh: 6200

## 2018-12-07 LAB — CBC WITH DIFFERENTIAL/PLATELET
Abs Immature Granulocytes: 0.02 10*3/uL (ref 0.00–0.07)
Basophils Absolute: 0 10*3/uL (ref 0.0–0.1)
Basophils Relative: 0 %
Eosinophils Absolute: 0.1 10*3/uL (ref 0.0–0.5)
Eosinophils Relative: 4 %
HCT: 25.2 % — ABNORMAL LOW (ref 36.0–46.0)
Hemoglobin: 7.6 g/dL — ABNORMAL LOW (ref 12.0–15.0)
Immature Granulocytes: 1 %
Lymphocytes Relative: 23 %
Lymphs Abs: 0.6 10*3/uL — ABNORMAL LOW (ref 0.7–4.0)
MCH: 29.9 pg (ref 26.0–34.0)
MCHC: 30.2 g/dL (ref 30.0–36.0)
MCV: 99.2 fL (ref 80.0–100.0)
Monocytes Absolute: 0.1 10*3/uL (ref 0.1–1.0)
Monocytes Relative: 4 %
Neutro Abs: 1.8 10*3/uL (ref 1.7–7.7)
Neutrophils Relative %: 68 %
Platelets: 69 10*3/uL — ABNORMAL LOW (ref 150–400)
RBC: 2.54 MIL/uL — ABNORMAL LOW (ref 3.87–5.11)
RDW: 25.4 % — ABNORMAL HIGH (ref 11.5–15.5)
WBC: 2.7 10*3/uL — ABNORMAL LOW (ref 4.0–10.5)
nRBC: 9.6 % — ABNORMAL HIGH (ref 0.0–0.2)

## 2018-12-07 LAB — BASIC METABOLIC PANEL
Anion gap: 10 (ref 5–15)
BUN: 39 mg/dL — ABNORMAL HIGH (ref 8–23)
CO2: 30 mmol/L (ref 22–32)
Calcium: 7.6 mg/dL — ABNORMAL LOW (ref 8.9–10.3)
Chloride: 101 mmol/L (ref 98–111)
Creatinine, Ser: 0.79 mg/dL (ref 0.44–1.00)
GFR calc Af Amer: >60 mL/min (ref >60)
GFR calc non Af Amer: >60 mL/min (ref >60)
Glucose, Bld: 61 mg/dL — ABNORMAL LOW (ref 70–99)
Potassium: 3.9 mmol/L (ref 3.5–5.1)
Sodium: 141 mmol/L (ref 135–145)

## 2018-12-07 LAB — PREPARE PLATELET PHERESIS
Unit division: 0
Unit division: 0

## 2018-12-07 LAB — CULTURE, BLOOD (ROUTINE X 2): Special Requests: ADEQUATE

## 2018-12-07 LAB — URINALYSIS, COMPLETE (UACMP) WITH MICROSCOPIC
Bilirubin Urine: NEGATIVE
Glucose, UA: NEGATIVE mg/dL
Ketones, ur: NEGATIVE mg/dL
Nitrite: NEGATIVE
Protein, ur: 30 mg/dL — AB
Specific Gravity, Urine: 1.015 (ref 1.005–1.030)
pH: 8 (ref 5.0–8.0)

## 2018-12-07 LAB — TRANSFUSION REACTION
DAT C3: POSITIVE
Post RXN DAT IgG: NEGATIVE

## 2018-12-07 LAB — GRAM STAIN: Gram Stain: NONE SEEN

## 2018-12-07 LAB — D-DIMER, QUANTITATIVE: D-Dimer, Quant: >20 ug/mL-FEU — ABNORMAL HIGH (ref 0.00–0.50)

## 2018-12-07 LAB — VITAMIN B12: Vitamin B-12: 5682 pg/mL — ABNORMAL HIGH (ref 180–914)

## 2018-12-07 LAB — C-REACTIVE PROTEIN: CRP: 9 mg/dL — ABNORMAL HIGH (ref <1.0)

## 2018-12-07 LAB — HEPARIN LEVEL (UNFRACTIONATED): Heparin Unfractionated: <0.1 IU/mL — ABNORMAL LOW (ref 0.30–0.70)

## 2018-12-07 LAB — MAGNESIUM: Magnesium: 2.1 mg/dL (ref 1.7–2.4)

## 2018-12-07 LAB — CK: Total CK: 2387 U/L — ABNORMAL HIGH (ref 38–234)

## 2018-12-07 MED ORDER — INSULIN ASPART 100 UNIT/ML ~~LOC~~ SOLN
0.0000 [IU] | SUBCUTANEOUS | Status: DC
  Administered 2018-12-07: 2 [IU] via SUBCUTANEOUS
  Administered 2018-12-08: 1 [IU] via SUBCUTANEOUS
  Administered 2018-12-08: 2 [IU] via SUBCUTANEOUS
  Administered 2018-12-08: 1 [IU] via SUBCUTANEOUS
  Administered 2018-12-08: 2 [IU] via SUBCUTANEOUS
  Administered 2018-12-08: 1 [IU] via SUBCUTANEOUS
  Administered 2018-12-09 (×2): 2 [IU] via SUBCUTANEOUS
  Administered 2018-12-09: 3 [IU] via SUBCUTANEOUS
  Administered 2018-12-09: 2 [IU] via SUBCUTANEOUS

## 2018-12-07 MED ORDER — VECURONIUM BROMIDE 10 MG IV SOLR
10.0000 mg | Freq: Once | INTRAVENOUS | Status: AC
  Administered 2018-12-07: 10 mg via INTRAVENOUS

## 2018-12-07 MED ORDER — FENTANYL CITRATE (PF) 100 MCG/2ML IJ SOLN: 100.0000 ug | Freq: Once | INTRAMUSCULAR | Status: AC

## 2018-12-07 MED ORDER — DEXTROSE 50 % IV SOLN
25.0000 mL | Freq: Once | INTRAVENOUS | Status: AC
  Administered 2018-12-07: 25 mL via INTRAVENOUS

## 2018-12-07 MED ORDER — SODIUM CHLORIDE 0.9% IV SOLUTION: Freq: Once | INTRAVENOUS | Status: DC

## 2018-12-07 MED ORDER — ETOMIDATE 2 MG/ML IV SOLN
10.0000 mg | Freq: Once | INTRAVENOUS | Status: AC
  Administered 2018-12-07: 20 mg via INTRAVENOUS

## 2018-12-07 MED ORDER — DEXTROSE 50 % IV SOLN
INTRAVENOUS | Status: AC
  Administered 2018-12-07: 25 mL
  Filled 2018-12-07: qty 50

## 2018-12-07 MED ORDER — INSULIN DETEMIR 100 UNIT/ML ~~LOC~~ SOLN
40.0000 [IU] | Freq: Two times a day (BID) | SUBCUTANEOUS | Status: DC
  Administered 2018-12-07 – 2018-12-09 (×4): 40 [IU] via SUBCUTANEOUS
  Filled 2018-12-07 (×6): qty 0.4

## 2018-12-07 NOTE — Progress Notes (Addendum)
PROGRESS NOTE    [name] [name]  PJK:[id number] DOB: [birth year] DOA: 11/08/2018 PCP: Nicholos Johns, MD   Brief Narrative:  68 y.o.BF PMHx morbid obesity, DM type II, COPD not on oxygen, HTN, chronic atrial fibrillation CHAD2VASc~4, on Xarelto and Cardizem,spine surgeries in the past,chronic fluid retention, history of Graves' disease now hypothyroid, history of CVA, history of DVT who lives at [facility] and was likely exposed to COVID-19 infection about 10 to 14 days prior to this admission.     She was experiencing fever, body aches and developed shortness of breath.  She finally decided to come into the emergency department at [facility].  She was found to be positive for COVID-19.  Was noted to have hypoxia.  She was hospitalized for further management.    Subjective: 8/10 alert, nods yes and no to questions wiggles her left toe to command.  Coughs to command.  Unable to move other extremities   Assessment & Plan:   Active Problems:   Essential hypertension   Acute respiratory failure with hypoxemia (HCC)   PAF (paroxysmal atrial fibrillation) (HCC)   Pneumonia due to COVID-19 virus   SOB (shortness of breath)   Ventricular tachyarrhythmia (HCC)   Intubation of airway performed without difficulty   Subcutaneous emphysema (HCC)   Pneumomediastinum (HCC)   Pressure injury of skin   MRSA bacteremia   Atrial fibrillation, chronic   Anasarca   Acute renal failure superimposed on stage 3 chronic kidney disease ([place])   Controlled type 2 diabetes mellitus with hyperglycemia (HCC)   Acute blood loss anemia   Thrombocytopenia (HCC)   Hemiparesis (HCC)   Elevated CK   Diabetes mellitus type 2, controlled, with complications ([place])  Acute respiratory failure with hypoxia/COVID 19 pneumonia/ARDS -Completed course Remdesivir -Dexamethasone started 7/22 6 mg daily tapered and stopped at 14 days. -Actemra x2 doses 7/22 and 7/23 -Continue vitamins per COVID protocol  -Afebrile overnight - 8/6 PCXR shows worsening pneumonia see results below - 8/8 continue daily weaning trials - 8/8 patient now off continuous Versed, Precedex, will wean patient off of clonazepam. -Fentanyl as needed,e Vrsed PRN, oxycodone IR as needed -8/8 wean patient off of clonazepam  Recent Labs  Lab 12/03/18 0500 12/04/18 0500 12/05/18 0500 12/06/18 0453 12/07/18 0500  CRP 1.1* 1.7* 6.1* 5.3* 9.0*   Recent Labs  Lab 12/02/18 0510 12/03/18 0500 12/04/18 0500 12/05/18 0500 12/06/18 0635  DDIMER 3.81* 6.55* 17.45* 18.07* 12.97*  8/10 patient scheduled for tracheostomy.  PCXR post trach ~ 1630   Asthma vs COPD - See acute respiratory failure  Subcutaneous emphysema/pneumomediastinum - 7/26 diagnosis subcutaneous emphysema.  CXR also suggested mediastinal emphysema.  CXR findings have been stable - Conservative management no indication for chest tube -8/6 PCXR: See acute respiratory failure -8/10 PCXR: mild interval progression of bilateral airspace opacities.  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  Stable   Bilateral hemiparalysis - Patient unable to move bilateral upper extremities or bilateral lower extremities.  States she can feel when she is being touched on her extremities but does not register pain. - Neurology; recommends LP, his DDX critical care myopathy vs neuropathy, vs Gilliam Barr syndrome vs infection -8/10 scheduled for LP post placement tracheostomy tube  MRSA bacteremia -Central/PIC line removed -Patient placed on appropriate antibiotics -Patient will receive 24 to 48-hour line holiday - Blood culture NGTD see below - 7/24 echocardiogram negative vegetation.  However patient  diagnosed with MRSA bacteremia on 8/2 repeat echocardiogram most likely will be required.  Would prefer TEE.  Will need to discuss with cardiology - 8/2 repeat blood  cultures positive staph aureus speciation and susceptibilities pending. -8/7 discussed case with cardiology recommend empiric coverage for possible endocarditis.  Will not perform TEE at this time as patient has COVID and procedure is an aerosol lysing procedure. -8/7 discussed case with Dr. Carlyle Basques, ID concerning restarting antibiotics.  Does not believe patient is a vancomycin failure therefore recommends restarting vancomycin for minimum of 4 weeks treatment. -8/8 Dr. Carlyle Basques ID has changed vancomycin---> daptomycin for a 4 to 6-week course of antibiotics. - Recommend at day 21 again request that cardiology performed TEE. - 8/10 despite elevation in CK ID recommends continuation of daptomycin  Elevated CK -Monitor closely Recent Labs  Lab 12/05/18 1400 12/07/18 0500  CKTOTAL 2,215* 2,387*   Enterovesical Fistula ? -Some concern that patient may have a fistula, but I am not fully convinced patient has such fistula.  Usually patient would have a significant leukocytosis, fever etc.  But given that she has been on immunosuppressive medication we will proceed as if fistula possible. - Obtain urinalysis - Obtain urine culture - 8/7 discussed case with radiology they recommended CT abdomen/pelvis with oral and IV contrast. -Dependent upon findings consult GI or GYN.  -8/8 CT abdomen with: Inconclusive, see results below will consult GI on 8/9 discussed case going forward. -8/10 will address once patient more stable in regards to placement of tracheostomy, LP, neurological deficits identified and corrected.  Chronic A. fib with episodes of V. tach - At home was treated with Xarelto -Amiodarone drip -Digoxin 0.125 mg daily -Metoprolol 2.5 mg QID -Metoprolol PRN - Currently rate controlled - 8/5 all anticoagulants held currently.  Discussed case with RN and patient is continuing to bleed, pure wick looked like a tampon when it was changed out.  Will reassess starting  anticoagulant in next 24 to 48 hours. - Patient also thrombocytopenic if she continues to bleed will transfuse unit of platelets.  -Platelets low normal prior to admission -CHADS2VASc score~3 -8/6 bleeding has stabilized continue to hold all anticoagulants for another 24 hours will then make decision on whether to restart. 8/9 continue to hold heparin drip preparation for tracheostomy/LP on 8/10   Hypotension - Midodrine 5 mg 3 times daily -Discontinue scheduled metoprolol  Hypokalemia -Potassium goal> 4  Hypomagnesmia -Magnesium goal> 2   Anasarca. -Appears to be third spacing with fluid. -Strict in and out +10.5 L -Daily weight Filed Weights   12/01/18 0500 12/05/18 0500 12/06/18 0500  Weight: 118.3 kg 118.5 kg 117.2 kg  -8/8 Albumin 50 g + Lasix 60 mg.  Continue PRN albumin+ Lasix -No central line access therefore cannot obtain CVP   Acute on CKD stage III ?  (Cr 12/25/2016 0.90) -Unknown baseline renal function -Baseline renal function not entirely known.  Recent Labs  Lab 12/02/18 0510 12/03/18 0500 12/04/18 0500 12/05/18 0500 12/06/18 0453  CREATININE 1.13* 0.85 0.75 0.69 0.82  -Improved  Hypothyroidism -Continue levothyroxine.   -As noted above her TSH was noted to be low.  Free T4 is 1.0.  No further work-up.  Low TSH likely sick euthyroid.  Hx stroke  -Patient was on Xarelto and aspirin, have been switched over to IV heparin however given her ongoing bleeding. All anticoagulants on hold.   History of vitamin D deficiency Continue supplementation.  Hx DVT -Xarelto on hold secondary to bleeding  Diabetes type 2 controlled with hyperglycemia -7/22 hemoglobin A1c = 7.0  - 8/10 decrease Levemir 40 units nightly - 8/10 NovoLog 10 units Q 4 hours (hold) - 8/10 decrease to sensitive SSI   Morbid obesity -Body mass index is 46.2 kg/m.   Nutrition -Continue tube feedings.  Constipation resolved -Continue bowel regimen.  Mildly elevated  AST. -Monitor for now.  Acute blood loss anemia/Bleeding from oral cavity/melena -Patient had been on Xarelto and heparin all anticoagulants discontinued -Resolved  -Hemoglobin on admission was 12.6. Recent Labs  Lab 12/04/18 0500 12/05/18 0500 12/06/18 0453 12/06/18 0517 12/07/18 0500  HGB 8.6* 8.4* 8.1* 8.5* 7.6*  - Transfuse for hemoglobin<7  Acute thrombocytopenia - Most likely secondary to MRSA bacteremia -HIT panel negative  -8/10 transfused 2 units of platelets  Leukocytosis - Most likely secondary to infection - We will type and cross      DVT prophylaxis: Heparin  Code Status: Partial Family Communication 8/10: [place] daughter informed her that tracheostomy and LP completed today, discuss plan of care answered all questions.   Disposition Plan: TBD   Consultants:  PCCM Cardiology phone consult Dr. Buford Dresser Neurology Dr. Kathrynn Speed    Procedures/Significant Events:  7/23 intubation >Central line insertion 11/19/2018 >Cortrack replacement 11/25/2018 >Transthoracic echocardiogram 7/24 1. The left ventricle has hyperdynamic systolic function, with an ejection fraction of >65%. The cavity size was normal. Left ventricular diastolic Doppler parameters are consistent with impaired relaxation. 2. The right ventricle has normal systolc function. The cavity was normal. There is no increase in right ventricular wall thickness. Right ventricular systolic pressure is normal. 3. Mild thickening of the aortic valve. No stenosis of the aortic valve. 4. The aortic root and ascending aorta are normal in size and structure. > Limited echocardiogram 12/01/2018 no vegetation. 8/6 PCXR: New LEFT supra clavicular soft tissue emphysema, no visible pneumothorax - Worsening pneumonia, chronically elevated RIGHT diaphragm 8/7 CT abdomen pelvis W contrast:Nonspecific thickening of the urinary bladder.-no evidence of contrast fistulation from the  urinary bladder to the bowel on delayed urographic contrast phase, however this does not exclude the presence of enterovesical fistula. There is no air within the urinary bladder, which is highly characteristic of fistula. More definitive evaluation will require fluoroscopic cystogram and/or contrast enema when clinically appropriate. -colon is fluid-filled to the rectum, in keeping with diarrheal illness, and a rectal tube is in place. -extensive, dense consolidation and ground-glass opacity in the included bilateral lung bases. 8/10 PCXR- mild interval progression of bilateral airspace opacities - Stable support apparatus  I have personally reviewed and interpreted all radiology studies and my findings are as above.  VENTILATOR SETTINGS:    Cultures 8/4 blood RIGHT handx2 positive staph aureus 8/6 blood RIGHT arm positive MRSA x2 8/9 blood RIGHT antecubital NGTD 8/9 blood LEFT HAND NGTD 8/10 CSF pending    Antimicrobials: Anti-infectives (From admission, onward)   Start     Stop   12/06/18 0000  vancomycin (VANCOCIN) 1,250 mg in sodium chloride 0.9 % 250 mL IVPB  Status:  Discontinued     12/05/18 1317   12/05/18 2000  DAPTOmycin (CUBICIN) 948 mg in sodium chloride 0.9 % IVPB         12/04/18 0000  vancomycin (VANCOCIN) IVPB 1000 mg/200 mL premix  Status:  Discontinued     12/05/18 1238   12/02/18 1400  vancomycin (VANCOCIN) 1,750 mg in sodium chloride 0.9 % 500 mL IVPB  Status:  Discontinued     12/02/18 1151  12/02/18 1300  vancomycin (VANCOCIN) IVPB 1000 mg/200 mL premix  Status:  Discontinued     12/03/18 1505   11/30/18 1430  vancomycin (VANCOCIN) 2,500 mg in sodium chloride 0.9 % 500 mL IVPB     11/30/18 1711   11/19/18 2030  remdesivir 100 mg in sodium chloride 0.9 % 250 mL IVPB     11/22/18 2204   11/17/2018 2030  remdesivir 200 mg in sodium chloride 0.9 % 250 mL IVPB     11/17/2018 2056        Devices    LINES / TUBES:      Continuous Infusions: . sodium  chloride Stopped (12/01/18 1300)  . DAPTOmycin (CUBICIN)  IV Stopped (12/06/18 2025)  . fentaNYL infusion INTRAVENOUS 50 mcg/hr (12/07/18 0600)  . phenylephrine (NEO-SYNEPHRINE) Adult infusion 25 mcg/min (12/07/18 0600)     Objective: Vitals:   12/07/18 0415 12/07/18 0430 12/07/18 0500 12/07/18 0600  BP: 114/60 (!) 95/49 (!) 105/51 (!) 90/52  Pulse: 99 95 96 92  Resp: (!) 32 (!) 36 (!) 28 (!) 27  Temp:      TempSrc:      SpO2: 90% 90% 91% 91%  Weight:      Height:        Intake/Output Summary (Last 24 hours) at 12/07/2018 0745 Last data filed at 12/07/2018 0600 Gross per 24 hour  Intake 1194.28 ml  Output 1540 ml  Net -345.72 ml   Filed Weights   12/01/18 0500 12/05/18 0500 12/06/18 0500  Weight: 118.3 kg 118.5 kg 117.2 kg   Physical Exam:  General: Positive eyes open, follows commands, positive acute respiratory distress Eyes: negative scleral hemorrhage, negative anisocoria, ne #7.5 cuffed ETT in place negative sign of infection or bleeding ENT: Negative Runny nose, negative gingival bleeding, Neck:  Negative scars, masses, torticollis, lymphadenopathy, JVD Lungs: Tachypneic, clear to auscultation bilaterally without wheezes or crackles Cardiovascular: Regular rate and rhythm without murmur gallop or rub normal S1 and S2 Abdomen: Obese, negative abdominal pain, nondistended, positive soft, bowel sounds, no rebound, no ascites, no appreciable mass Extremities: No significant cyanosis, clubbing, or edema bilateral lower extremities Skin: Negative rashes, lesions, ulcers Psychiatric: Unable to fully evaluate secondary to intubation but appears comfortable, not anxious Central nervous system: Alert nods appropriately, understands she will be receiving tracheostomy/LP today     .     Data Reviewed: Care during the described time interval was provided by me .  I have reviewed this patient's available data, including medical history, events of note, physical examination,  and all test results as part of my evaluation.   CBC: Recent Labs  Lab 12/03/18 0500 12/04/18 0500 12/05/18 0500 12/06/18 0453 12/06/18 0517 12/07/18 0500  WBC 4.0 3.5* 3.3* 2.2*  --  2.7*  NEUTROABS 3.2 2.5 2.2 1.4*  --  1.8  HGB 8.3* 8.6* 8.4* 8.1* 8.5* 7.6*  HCT 28.2* 29.8* 28.7* 26.9* 25.0* 25.2*  MCV 96.9 98.3 98.3 97.8  --  99.2  PLT 58* 56* 55* 57*  --  69*   Basic Metabolic Panel: Recent Labs  Lab 12/02/18 0510 12/03/18 0500 12/04/18 0500 12/05/18 0500 12/06/18 0453 12/06/18 0517 12/07/18 0500  NA 143 143 143 141 141 140  --   K 4.2 4.4 4.3 4.5 3.8 3.6  --   CL 101 102 102 103 100  --   --   CO2 35* 33* 33* 30 31  --   --   GLUCOSE 181* 177* 135* 114* 122*  --   --  BUN 85* 70* 47* 40* 36*  --   --   CREATININE 1.13* 0.85 0.75 0.69 0.82  --   --   CALCIUM 7.4* 7.5* 7.5* 7.4* 7.5*  --   --   MG 2.5* 2.5* 2.1 2.0 1.8  --  2.1   GFR: Estimated Creatinine Clearance: 81.2 mL/min (by C-G formula based on SCr of 0.82 mg/dL). Liver Function Tests: Recent Labs  Lab 12/02/18 0510 12/03/18 0500 12/04/18 0500 12/05/18 0500 12/06/18 0453  AST 85* 64* 80* 113* 119*  ALT 97* 100* 99* 112* 109*  ALKPHOS 61 58 63 66 64  BILITOT 1.3* 1.1 1.4* 1.5* 1.6*  PROT 4.7* 4.7* 4.7* 4.6* 4.8*  ALBUMIN 2.6* 2.4* 2.2* 2.1* 2.4*   No results for input(s): LIPASE, AMYLASE in the last 168 hours. No results for input(s): AMMONIA in the last 168 hours. Coagulation Profile: Recent Labs  Lab 12/06/18 0635  INR 1.1   Cardiac Enzymes: Recent Labs  Lab 12/05/18 1400 12/07/18 0500  CKTOTAL 2,215* 2,387*   BNP (last 3 results) No results for input(s): PROBNP in the last 8760 hours. HbA1C: No results for input(s): HGBA1C in the last 72 hours. CBG: Recent Labs  Lab 12/06/18 1551 12/06/18 1942 12/06/18 2350 12/07/18 0634 12/07/18 0705  GLUCAP 140* 154* 173* 56* 100*   Lipid Profile: No results for input(s): CHOL, HDL, LDLCALC, TRIG, CHOLHDL, LDLDIRECT in the last 72  hours. Thyroid Function Tests: Recent Labs    12/05/18 0500  TSH 6.174*  FREET4 0.65   Anemia Panel: No results for input(s): VITAMINB12, FOLATE, FERRITIN, TIBC, IRON, RETICCTPCT in the last 72 hours. Urine analysis:    Component Value Date/Time   COLORURINE YELLOW 11/30/2018 0400   APPEARANCEUR HAZY (A) 11/30/2018 0400   LABSPEC <1.005 (L) 11/30/2018 0400   PHURINE >9.0 (H) 11/30/2018 0400   GLUCOSEU NEGATIVE 11/30/2018 0400   HGBUR SMALL (A) 11/30/2018 0400   BILIRUBINUR NEGATIVE 11/30/2018 0400   KETONESUR NEGATIVE 11/30/2018 0400   PROTEINUR TRACE (A) 11/30/2018 0400   UROBILINOGEN 1.0 06/20/2014 0442   NITRITE NEGATIVE 11/30/2018 0400   LEUKOCYTESUR SMALL (A) 11/30/2018 0400   Sepsis Labs: @LABRCNTIP (procalcitonin:4,lacticidven:4)  ) Recent Results (from the past 240 hour(s))  Culture, blood (routine x 2)     Status: Abnormal   Collection Time: 11/29/18 10:55 PM   Specimen: BLOOD  Result Value Ref Range Status   Specimen Description BLOOD RIGHT ANTECUBITAL  Final   Special Requests   Final    BOTTLES DRAWN AEROBIC AND ANAEROBIC Blood Culture adequate volume   Culture  Setup Time   Final    GRAM POSITIVE COCCI IN CLUSTERS IN BOTH AEROBIC AND ANAEROBIC BOTTLES CRITICAL RESULT CALLED TO, READ BACK BY AND VERIFIED WITH: [facility] CAREN AMEND [id number] FCP Performed at [facility], [place] [address], [place], [place] [address]    Culture METHICILLIN RESISTANT STAPHYLOCOCCUS AUREUS (A)  Final   Report Status 12/02/2018 FINAL  Final   Organism ID, Bacteria METHICILLIN RESISTANT STAPHYLOCOCCUS AUREUS  Final      Susceptibility   Methicillin resistant staphylococcus aureus - MIC*    CIPROFLOXACIN <=0.5 SENSITIVE Sensitive     ERYTHROMYCIN >=8 RESISTANT Resistant     GENTAMICIN <=0.5 SENSITIVE Sensitive     OXACILLIN >=4 RESISTANT Resistant     TETRACYCLINE <=1 SENSITIVE Sensitive     VANCOMYCIN 1 SENSITIVE Sensitive     TRIMETH/SULFA <=10 SENSITIVE Sensitive      CLINDAMYCIN <=0.25 SENSITIVE Sensitive  RIFAMPIN <=0.5 SENSITIVE Sensitive     Inducible Clindamycin NEGATIVE Sensitive     * METHICILLIN RESISTANT STAPHYLOCOCCUS AUREUS  Blood Culture ID Panel (Reflexed)     Status: Abnormal   Collection Time: 11/29/18 10:55 PM  Result Value Ref Range Status   Enterococcus species NOT DETECTED NOT DETECTED Final   Listeria monocytogenes NOT DETECTED NOT DETECTED Final   Staphylococcus species DETECTED (A) NOT DETECTED Final    Comment: CRITICAL RESULT CALLED TO, READ BACK BY AND VERIFIED WITH: [facility]AR[facility] CAREN AMEND [id number] FCP    Staphylococcus aureus (BCID) DETECTED (A) NOT DETECTED Final    Comment: Methicillin (oxacillin)-resistant Staphylococcus aureus (MRSA). MRSA is predictably resistant to beta-lactam antibiotics (except ceftaroline). Preferred therapy is vancomycin unless clinically contraindicated. Patient requires contact precautions if  hospitalized. CRITICAL RESULT CALLED TO, READ BACK BY AND VERIFIED WITH: PHARMD CAREN AMEND [id number] FCP    Methicillin resistance DETECTED (A) NOT DETECTED Final    Comment: CRITICAL RESULT CALLED TO, READ BACK BY AND VERIFIED WITH: PHARMD CAREN AMEND [id number] FCP    Streptococcus species NOT DETECTED NOT DETECTED Final   Streptococcus agalactiae NOT DETECTED NOT DETECTED Final   Streptococcus pneumoniae NOT DETECTED NOT DETECTED Final   Streptococcus pyogenes NOT DETECTED NOT DETECTED Final   Acinetobacter baumannii NOT DETECTED NOT DETECTED Final   Enterobacteriaceae species NOT DETECTED NOT DETECTED Final   Enterobacter cloacae complex NOT DETECTED NOT DETECTED Final   Escherichia coli NOT DETECTED NOT DETECTED Final   Klebsiella oxytoca NOT DETECTED NOT DETECTED Final   Klebsiella pneumoniae NOT DETECTED NOT DETECTED Final   Proteus species NOT DETECTED NOT DETECTED Final   Serratia marcescens NOT DETECTED NOT DETECTED Final   Haemophilus influenzae NOT DETECTED NOT DETECTED Final    Neisseria meningitidis NOT DETECTED NOT DETECTED Final   Pseudomonas aeruginosa NOT DETECTED NOT DETECTED Final   Candida albicans NOT DETECTED NOT DETECTED Final   Candida glabrata NOT DETECTED NOT DETECTED Final   Candida krusei NOT DETECTED NOT DETECTED Final   Candida parapsilosis NOT DETECTED NOT DETECTED Final   Candida tropicalis NOT DETECTED NOT DETECTED Final    Comment: Performed at [facility], [place]. [address], [place], [place] [address]  Culture, blood (routine x 2)     Status: Abnormal   Collection Time: 11/29/18 11:05 PM   Specimen: BLOOD RIGHT HAND  Result Value Ref Range Status   Specimen Description BLOOD RIGHT HAND  Final   Special Requests   Final    BOTTLES DRAWN AEROBIC AND ANAEROBIC Blood Culture adequate volume   Culture  Setup Time   Final    GRAM POSITIVE COCCI IN BOTH AEROBIC AND ANAEROBIC BOTTLES CRITICAL VALUE NOTED.  VALUE IS CONSISTENT WITH PREVIOUSLY REPORTED AND CALLED VALUE.    Culture (A)  Final    STAPHYLOCOCCUS AUREUS SUSCEPTIBILITIES PERFORMED ON PREVIOUS CULTURE WITHIN THE LAST 5 DAYS. Performed at [facility], [place] [address] [place], [place] [address]    Report Status 12/02/2018 FINAL  Final  Culture, blood (Routine X 2) w Reflex to ID Panel     Status: Abnormal   Collection Time: 12/01/18  2:14 PM   Specimen: BLOOD RIGHT HAND  Result Value Ref Range Status   Specimen Description   Final    BLOOD RIGHT HAND Performed at [facility], [place] [address] [place],  [address]    Special Requests   Final    BOTTLES DRAWN AEROBIC AND ANAEROBIC Blood Culture  adequate volume Performed at [place] [address] [place], [place] [address]    Culture  Setup Time   Final    GRAM POSITIVE COCCI ANAEROBIC BOTTLE ONLY CRITICAL VALUE NOTED.  VALUE IS CONSISTENT WITH PREVIOUSLY REPORTED AND CALLED VALUE.    Culture (A)  Final    STAPHYLOCOCCUS AUREUS SUSCEPTIBILITIES PERFORMED ON PREVIOUS CULTURE  WITHIN THE LAST 5 DAYS. Performed at [facility], [place] [address] [place], [place] [address]    Report Status 12/04/2018 FINAL  Final  Culture, blood (Routine X 2) w Reflex to ID Panel     Status: Abnormal   Collection Time: 12/01/18  2:20 PM   Specimen: BLOOD  Result Value Ref Range Status   Specimen Description   Final    BLOOD RIGHT HAND Performed at [facility] [address], [place], [place] [address]    Special Requests   Final    BOTTLES DRAWN AEROBIC AND ANAEROBIC Blood Culture adequate volume Performed at [facility] [address], [place], [place] [address]    Culture  Setup Time   Final    IN BOTH AEROBIC AND ANAEROBIC BOTTLES GRAM POSITIVE COCCI CRITICAL VALUE NOTED.  VALUE IS CONSISTENT WITH PREVIOUSLY REPORTED AND CALLED VALUE.    Culture (A)  Final    STAPHYLOCOCCUS AUREUS SUSCEPTIBILITIES PERFORMED ON PREVIOUS CULTURE WITHIN THE LAST 5 DAYS. Performed at [facility], [place] [address], [place], [place] [address]    Report Status 12/04/2018 FINAL  Final  Culture, blood (Routine X 2) w Reflex to ID Panel     Status: Abnormal   Collection Time: 12/03/18  4:16 PM   Specimen: BLOOD  Result Value Ref Range Status   Specimen Description   Final    BLOOD RIGHT ARM Performed at [place] [address], [place], [place] [address]    Special Requests   Final    BOTTLES DRAWN AEROBIC AND ANAEROBIC Blood Culture adequate volume Performed at [facility] [address] [place], [place] [address]    Culture  Setup Time   Final    GRAM POSITIVE COCCI ANAEROBIC BOTTLE ONLY CRITICAL VALUE NOTED.  VALUE IS CONSISTENT WITH PREVIOUSLY REPORTED AND CALLED VALUE.    Culture (A)  Final    STAPHYLOCOCCUS AUREUS SUSCEPTIBILITIES PERFORMED ON PREVIOUS CULTURE WITHIN THE LAST 5 DAYS. Performed at [facility], [place] [address], [place], [place] [address]    Report Status  12/07/2018 FINAL  Final  Culture, blood (Routine X 2) w Reflex to ID Panel     Status: None (Preliminary result)   Collection Time: 12/03/18  4:29 PM   Specimen: BLOOD  Result Value Ref Range Status   Specimen Description   Final    BLOOD RIGHT ARM Performed at [facility] [address], [place], [place] [address]    Special Requests   Final    BOTTLES DRAWN AEROBIC AND ANAEROBIC Blood Culture adequate volume Performed at [facility] [address], [place], [place] [address]    Culture   Final    NO GROWTH 3 DAYS Performed at [facility], [place] [address], [place], [place] [address]    Report Status PENDING  Incomplete         Radiology Studies: No results found.      Scheduled Meds: . sodium chloride   Intravenous Once  . amiodarone  200 mg  Per Tube BID  . chlorhexidine  15 mL Mouth/Throat BID  . clonazePAM  0.5 mg Per Tube Daily  . digoxin  0.125 mg Intravenous Daily  . etomidate  40 mg Intravenous Once  . feeding supplement (PRO-STAT SUGAR FREE 64)  30 mL Per Tube QID  . feeding supplement (VITAL AF 1.2 CAL)  1,000 mL Per Tube Q24H  . fentaNYL (SUBLIMAZE) injection  200 mcg Intravenous Once  . ferrous sulfate  300 mg Per Tube TID WC  . free water  300 mL Per Tube Q6H  . gabapentin  100 mg Per Tube Q8H  . insulin aspart  0-20 Units Subcutaneous Q4H  . insulin aspart  10 Units Subcutaneous Q4H  . insulin detemir  50 Units Subcutaneous BID  . levothyroxine  112 mcg Per Tube Q0600  . mouth rinse  15 mL Mouth Rinse 10 times per day  . midazolam  5 mg Intravenous Once  . midodrine  5 mg Per Tube Q8H  . multivitamin with minerals  1 tablet Per Tube Daily  . pantoprazole sodium  40 mg Per Tube BID  . polyethylene glycol  17 g Per Tube Daily  . propofol  500 mg Intravenous Once  . sodium chloride flush  10-40 mL Intracatheter Q12H  . vecuronium  10 mg Intravenous Once  . vitamin C  500 mg Per Tube Daily  . zinc  sulfate  220 mg Per Tube Daily   Continuous Infusions: . sodium chloride Stopped (12/01/18 1300)  . DAPTOmycin (CUBICIN)  IV Stopped (12/06/18 2025)  . fentaNYL infusion INTRAVENOUS 50 mcg/hr (12/07/18 0600)  . phenylephrine (NEO-SYNEPHRINE) Adult infusion 25 mcg/min (12/07/18 0600)     LOS: 19 days   The patient is critically ill with multiple organ systems failure and requires high complexity decision making for assessment and support, frequent evaluation and titration of therapies, application of advanced monitoring technologies and extensive interpretation of multiple databases. Critical Care Time devoted to patient care services described in this note  Time spent: 40 minutes     Venesa Semidey, Geraldo Docker, MD [facility] Pager [phone]  If 7PM-7AM, please contact night-coverage [email or web address] Password [email or web address] 12/07/2018, 7:45 AM

## 2018-12-07 NOTE — Progress Notes (Signed)
Blood Transfusion Reaction Investigation  Transfusion Reaction Investigation:   Patient's Clinical History:  HTN, Acute respiratory failure, Covid Positive, chronic Atril Fibrillation, anasarca, ARF on CKD st3, DM2, acute blood loss anemia   Interpretation of Reaction:  Fever   Comments/Recommendations:  Per MD stop transfusion and give Tylenol   Physician Contacted:  Yes, @0030   Blood Transfusion Reaction (Nursing Documentation):      Labs   Group/Rh:    DAT-Direct [place]:     Hemolysis- Pre/Post sample:     Icterus-Pre/Post sample:     Ria Bush, RN 12/07/2018 12:58 AM

## 2018-12-07 NOTE — Significant Event (Addendum)
Assisted in transporting patient to OR for tracheostomy and lumbar puncture. RN administered medications at instructions of performing MD. RN remained with patient  throughout procedures and assisted in transporting patient back to room safely. Report given to receiving RN.    Baruc Tugwell

## 2018-12-07 NOTE — Progress Notes (Signed)
RT note: Assisted MD with OR trach placement.RT and RN transported patient from OR to ICU. Vital signs stable through out.

## 2018-12-07 NOTE — Progress Notes (Signed)
Patient with suspected transfusion reaction: Temp increase from 98.9 to 100.7.  About 170ml left to go on first unit of platelets.  Transfusion stopped and patient being given tylenol.

## 2018-12-07 NOTE — Progress Notes (Signed)
Inpatient Diabetes Program Recommendations  AACE/ADA: New Consensus Statement on Inpatient Glycemic Control (2015)  Target Ranges:  Prepandial:   less than 140 mg/dL      Peak postprandial:   less than 180 mg/dL (1-2 hours)      Critically ill patients:  140 - 180 mg/dL   Lab Results  Component Value Date   GLUCAP 80 12/07/2018   HGBA1C 7.0 (H) 11/23/2018    Review of Glycemic Control Results for [name] [name], [name] [name] (MRN [id number]) as of 12/07/2018 09:55  Ref. Range 12/05/2018 07:53 12/05/2018 09:29 12/05/2018 12:02 12/05/2018 16:59 12/05/2018 20:48 12/06/2018 00:35 12/06/2018 03:33  Glucose-Capillary Latest Ref Range: 70 - 99 mg/dL 87 120 (H) 146 (H) Novolog 4 units given  Tube Feed coverage not given 153 (H) Novolog 14 units given  105 (H) 110 (H) 101 (H)   Results for [name] [name], [name] [name] (MRN [id number]) as of 12/07/2018 09:55  Ref. Range 12/06/2018 07:45 12/06/2018 12:04 12/06/2018 15:51 12/06/2018 19:42 12/06/2018 23:50 12/07/2018 03:22 12/07/2018 06:34 12/07/2018 07:05 12/07/2018 07:53  Glucose-Capillary Latest Ref Range: 70 - 99 mg/dL 115 (H) 204 (H)  Novolog 17 units given 140 (H) Novolog 13 units given 154 (H) Novolog 14 units given  Levemir 50 units given 173 (H) Novolog 14 units given 83 56 (L) 100 (H) 80   Diabetes history: DM 2  Current orders for Inpatient glycemic control:  Levemir 50 units bid Novolog 0-20 units q4 hours Novolog 10 units q4 hours  No Steroids currently ordered  Tube Feeds Vital AF 1.2 40 ml/hour  Inpatient Diabetes Program Recommendations:    Hypoglycemia 50's at 0630 this am. Levemir ordered bid, however per [facility] patient has not been receiving the bid dosing. Patient did get Levemir last night.   Called Dr. Sherral Hammers to notify him.  Dr. Sherral Hammers to adjust insulin orders this am to possibly Levemir 40 units qhs. He will review trends in further detail.  Linna Hoff, RN to notify to hold Levemir dose until Dr. Sherral Hammers changes order.  Thanks,  Tama Headings RN, MSN,  BC-ADM Inpatient Diabetes Coordinator Team Pager [phone] (8a-5p)

## 2018-12-07 NOTE — Progress Notes (Signed)
Patient turned and cleaned after BM. When on her side she began to aspirate green bile output. Some was seen in her ETT. Also noted during her clean up was milky vaginal discharge. Omunique Pederson, Rande Brunt, RN

## 2018-12-07 NOTE — Patient Outreach (Signed)
[facility] Care Management  12/07/2018  [name] [name] 11-13-1950 [id number]   RN Health Coach Case Closure  Referral Date:12/11/2017 Referral Source:EMMI Prevent Screening Reason for Referral:Disease Management Education Insurance:[employer] Medicare   Outreach Attempt:  Patient continues to be hospitalized since 11/14/2018.  Hospital Liaison continues to follow for discharge needs.  Plan:  RN Health Coach will close Disease Management Case and make patient inactive due to prolonged hospitalization.  RN Health Coach will send primary care provider Closure Letter.  [place] [phone] [name]

## 2018-12-07 NOTE — Procedures (Signed)
Percutaneous Tracheostomy Placement  Consent from family.  Patient sedated, paralyzed and position.  Placed on 100% FiO2 and RR matched.  Area cleaned and draped.  Lidocaine/epi injected.  Skin incision done followed by blunt dissection.  Trachea palpated then punctured, catheter passed and visualized bronchoscopically.  Wire placed and visualized.  Catheter removed.  Airway then entered and dilated.  Size 6 cuffed shiley trach placed and visualized bronchoscopically well above carina.  Good volume returns.  Patient tolerated the procedure well without complications.  Minimal blood loss.  CXR ordered and pending.  Karissa Meenan G. Cleone Hulick, M.D. [place] [facility]. Pager: [phone]. After hours pager: [phone].  

## 2018-12-07 NOTE — Procedures (Signed)
Bronchoscopy  Indication: Tracheostomy placement guidance, therapeutic suctioning of secretions  Consent: Signed and in chart  Anesthesia: fentanyl/versed/etomidate/vecuronium  Procedure - Timeout performed - Bronchoscope advanced through ETT - Airways examined down to subsegmental level - Following airway examination, guidance provided for percutaneous tracheostomy  Findings - Significant bronchomalacia - Mild-moderate thick secretions - Tracheostomy in good position  Specimen(s): None  Complications: None immediate

## 2018-12-07 NOTE — Progress Notes (Signed)
12/06/18 @2330  began O+ platelet transfusion approx. 249mls. Patient's temp=98.75F oral, BP=113/68, HR=114, RR=42, SpO2=94%. Transfusion started @120ml /hr.  @2355  18min post start of transfusion T=99.75F oral, BP=129/83, HR=116, RR=36, SpO2=92% . Transfusion rate changed to 226ml/hr.  Temp taken again @0030  about halfway through transfusion. T-100.17F oral, BP=99/54, HR=109, RR=40, SpO2=94%. MD notified. Transfusion stopped, Tylenol administered, line flushed with normal saline per orders and protocol. About 100 ml left in bag, about 172ml given to patient

## 2018-12-07 NOTE — Progress Notes (Signed)
Spoke with pt's sister [name] about procedure and answered all her other questions. She is very appreciative of our care and will check back in this evening. Ghassan Coggeshall, Rande Brunt, RN

## 2018-12-07 NOTE — Progress Notes (Signed)
RT received pt from OR post trach placement. Pt with a #6 Shiley. RT will continue to monitor.

## 2018-12-07 NOTE — Progress Notes (Signed)
NAME:  [name] [name], MRN:  [id number], DOB:  [birth year], LOS: 59 ADMISSION DATE:  11/06/2018, CONSULTATION DATE:  July 23 REFERRING MD:  Candiss Norse, CHIEF COMPLAINT:  dyspnea   Brief History   68 year old female admitted for severe acute respiratory failure with hypoxemia due to COVID-19 pneumonia  Past Medical History  Diabetes mellitus type 2 Obesity Asthma, has been on inhalers in the past, never smoked cigarettes CKD stage III Chronic A. fib History of spine surgery History of Graves' disease, now hypothyroid History of stroke History of DVT Lives in an assisted living facility [facility] Events   July 22 admitted July 23 intubated July 24 prone positioning July 26 subcutaneous air, no pneumothorax, afib with RVR again July 29 decreasing sedation, diuresis July 30 decreasing sedation July 31 holding additional diuresis 8/2 weaning on pressure support 8/5 vent settings down to FiO2 45% and PEEP 5  Consults:  PCCM  Procedures:  7/23 ETT > 7/23 L IJ CVL >8/3 8/3 midline/PIV>   Significant Diagnostic Tests:  7/24 Echo > LVEF > 65%, impaired relaxation, RV normal systolic function, mild thickening of aortic valve 8/4 repeat Echo> LVEF normal > 65%, no clear endocarditis  Micro Data:  July 22 SARS COV 2 Positive 8/2 blood > MRSA 8/4 blood > MRSA 8/6 blood > MRSA 8/9 blood >  Antimicrobials:  July 22 remdesivir> 7/26 July 22 decadron July 22> 23 Toclizumab   Interim history/subjective:   Received platelets overnight however discontinued due to fever. Product and labs sent to investigate transfusion reaction. Awake, follows commands  Objective   Blood pressure (!) 110/58, pulse 92, temperature 99.6 F (37.6 C), temperature source Axillary, resp. rate (!) 23, height 5\' 3"  (1.6 m), weight 117.2 kg, SpO2 (!) 89 %.    Vent Mode: PCV FiO2 (%):  [50 %-60 %] 50 % Set Rate:  [24 bmp] 24 bmp Vt Set:  [360 mL] 360 mL PEEP:  [5 cmH20] 5 cmH20  Pressure Support:  [10 cmH20] 10 cmH20 Plateau Pressure:  [14 cmH20-22 cmH20] 22 cmH20   Intake/Output Summary (Last 24 hours) at 12/07/2018 0936 Last data filed at 12/07/2018 0800 Gross per 24 hour  Intake 1463.02 ml  Output 1240 ml  Net 223.02 ml   Filed Weights   12/01/18 0500 12/05/18 0500 12/06/18 0500  Weight: 118.3 kg 118.5 kg 117.2 kg   Physical Exam: General: Obese female laying in bed, no acute distress, awake HENT: [place], AT, ETT in place Eyes: EOMI, no scleral icterus Respiratory: Clear to auscultation bilaterally.  No crackles, wheezing or rales Cardiovascular: RRR, -M/R/G, no JVD GI: BS+, soft, nontender Extremities: Non-pitting edema in lower extremities, venous stasis changes in LLE Neuro:  Awake, alert, follows commands, moves slightly in lower extremities (toes) L>R GU: [facility] Problem list    Assessment & Plan:  Acute respiratory failure with hypoxemia: ARDS due to novel coronavirus infection Full vents support via ARDsnet protocol Titrate PEEP and FiO2 for goal SpO2 88-95% or pO2 55-80 Pulmonary hygiene including bronchodilator and flutter valve. AVOID manual percussion. Mobilize as tolerated 8/10: Tracheostomy scheduled today  Weakness: ICU-acquired weakness vs ?GBS Appreciate Neurology input. Recommend LP Plan for LP post-tracheostomy PT evaluation PROM q2h  Septic shock MRSA bacteremia: Possible endocarditis. last + BCx on 8/6  Repeat blood cultures Continue Dapto. Monitor CK Appreciate ID input Cardiology evaluated patient for TEE request. Stated TEE would assist in outpatient management of antibiotics but would not change inpatient management so TEE  request was declined. Wean Neo for MAP goal >65 Continue midodrine 5 mg TID Hold home anti-hypertensive agents  Ventricular tachycardia as inpatient Atrial fib with RVR  Tele  PO amio Continue metoprolol and digoxin Holding heparin for procedure. Restart tomorrow   Need for sedation for mechanical ventilation/dyssynchrony Fentanyl gtt for RASS goal 0 PRN oxycodone Wean benzo  ?enterovsiculofistula CT A/P negative  Pneumomediastinum: stable on 8/3 CXR, 8/7 interval development of SQ air - improved Continue to clinically monitor with prn CXR On minimal PEEP  ?Blood transfusion reaction Max temp 100.7 when platelet transfusion discontinued F/u work-up Monitor vitals for future blood product transfusions  Best practice:  Diet: Tube feeding Pain/Anxiety/Delirium protocol (if indicated): as above VAP protocol (if indicated): yes DVT prophylaxis: heparin infusion GI prophylaxis: Pantoprazole for stress ulcer prophylaxis Glucose control: SSI Mobility: bed rest Code Status: limited code Family Communication: will discuss with TRH Disposition: remain in ICU  Labs   CBC: Recent Labs  Lab 12/03/18 0500 12/04/18 0500 12/05/18 0500 12/06/18 0453 12/06/18 0517 12/07/18 0500  WBC 4.0 3.5* 3.3* 2.2*  --  2.7*  NEUTROABS 3.2 2.5 2.2 1.4*  --  1.8  HGB 8.3* 8.6* 8.4* 8.1* 8.5* 7.6*  HCT 28.2* 29.8* 28.7* 26.9* 25.0* 25.2*  MCV 96.9 98.3 98.3 97.8  --  99.2  PLT 58* 56* 55* 57*  --  69*    Basic Metabolic Panel: Recent Labs  Lab 12/02/18 0510 12/03/18 0500 12/04/18 0500 12/05/18 0500 12/06/18 0453 12/06/18 0517 12/07/18 0500  NA 143 143 143 141 141 140  --   K 4.2 4.4 4.3 4.5 3.8 3.6  --   CL 101 102 102 103 100  --   --   CO2 35* 33* 33* 30 31  --   --   GLUCOSE 181* 177* 135* 114* 122*  --   --   BUN 85* 70* 47* 40* 36*  --   --   CREATININE 1.13* 0.85 0.75 0.69 0.82  --   --   CALCIUM 7.4* 7.5* 7.5* 7.4* 7.5*  --   --   MG 2.5* 2.5* 2.1 2.0 1.8  --  2.1   GFR: Estimated Creatinine Clearance: 81.2 mL/min (by C-G formula based on SCr of 0.82 mg/dL). Recent Labs  Lab 12/04/18 0500 12/05/18 0500 12/06/18 0453 12/07/18 0500  WBC 3.5* 3.3* 2.2* 2.7*    Liver Function Tests: Recent Labs  Lab 12/02/18 0510 12/03/18 0500  12/04/18 0500 12/05/18 0500 12/06/18 0453  AST 85* 64* 80* 113* 119*  ALT 97* 100* 99* 112* 109*  ALKPHOS 61 58 63 66 64  BILITOT 1.3* 1.1 1.4* 1.5* 1.6*  PROT 4.7* 4.7* 4.7* 4.6* 4.8*  ALBUMIN 2.6* 2.4* 2.2* 2.1* 2.4*   No results for input(s): LIPASE, AMYLASE in the last 168 hours. No results for input(s): AMMONIA in the last 168 hours.  ABG    Component Value Date/Time   PHART 7.578 (H) 12/06/2018 0517   PCO2ART 35.6 12/06/2018 0517   PO2ART 59.0 (L) 12/06/2018 0517   HCO3 33.1 (H) 12/06/2018 0517   TCO2 34 (H) 12/06/2018 0517   ACIDBASEDEF 3.0 (H) 11/21/2018 0628   O2SAT 93.0 12/06/2018 0517     Coagulation Profile: Recent Labs  Lab 12/06/18 0635  INR 1.1    Cardiac Enzymes: Recent Labs  Lab 12/05/18 1400 12/07/18 0500  CKTOTAL 2,215* 2,387*    HbA1C: Hgb A1c MFr Bld  Date/Time Value Ref Range Status  11/16/2018 08:00 PM 7.0 (H) 4.8 -  5.6 % Final    Comment:    (NOTE) Pre diabetes:          5.7%-6.4% Diabetes:              >6.4% Glycemic control for   <7.0% adults with diabetes   10/18/2016 10:47 AM 6.0 (H) 4.8 - 5.6 % Final    Comment:    (NOTE)         Pre-diabetes: 5.7 - 6.4         Diabetes: >6.4         Glycemic control for adults with diabetes: <7.0     CBG: Recent Labs  Lab 12/06/18 2350 12/07/18 0322 12/07/18 0634 12/07/18 0705 12/07/18 0753  GLUCAP 173* 83 56* 100* 80    Critical care time: 40 minutes    The patient is critically ill with multiple organ systems failure and requires high complexity decision making for assessment and support, frequent evaluation and titration of therapies, application of advanced monitoring technologies and extensive interpretation of multiple databases.   Discussed and co-managed patient care with PCCM-Hospitalist. Coordinated care with RT, RN and pharmacist.  Rodman Pickle, M.D. [facility] Pulmonary/Critical Care Medicine 12/07/2018 9:51 AM  Pager: [phone] After hours pager: [phone]

## 2018-12-07 NOTE — Progress Notes (Signed)
OT Cancellation Note  Patient Details Name: [name] [name] MRN: [id number] DOB: 1950-10-15   Cancelled Treatment:    Reason Eval/Treat Not Completed: Patient not medically ready (Intubated. Planning for trach placement later today. Will return as schedule allows. Thank you.)  [place], OTR/L Acute Rehab Pager: [phone] Office: [phone] 12/07/2018, 1:13 PM

## 2018-12-08 ENCOUNTER — Inpatient Hospital Stay (HOSPITAL_COMMUNITY): Payer: Medicare Other

## 2018-12-08 DIAGNOSIS — A4189 Other specified sepsis: Secondary | ICD-10-CM

## 2018-12-08 DIAGNOSIS — G8190 Hemiplegia, unspecified affecting unspecified side: Secondary | ICD-10-CM

## 2018-12-08 DIAGNOSIS — Z930 Tracheostomy status: Secondary | ICD-10-CM

## 2018-12-08 LAB — BPAM PLATELET PHERESIS
Blood Product Expiration Date: 202008111227
Blood Product Expiration Date: 202008122359
ISSUE DATE / TIME: 202008101229
ISSUE DATE / TIME: 202008101229
Unit Type and Rh: 5100
Unit Type and Rh: 5100

## 2018-12-08 LAB — D-DIMER, QUANTITATIVE: D-Dimer, Quant: >20 ug/mL-FEU — ABNORMAL HIGH (ref 0.00–0.50)

## 2018-12-08 LAB — COMPREHENSIVE METABOLIC PANEL
ALT: 125 U/L — ABNORMAL HIGH (ref 0–44)
AST: 106 U/L — ABNORMAL HIGH (ref 15–41)
Albumin: 2.3 g/dL — ABNORMAL LOW (ref 3.5–5.0)
Alkaline Phosphatase: 92 U/L (ref 38–126)
Anion gap: 9 (ref 5–15)
BUN: 30 mg/dL — ABNORMAL HIGH (ref 8–23)
CO2: 29 mmol/L (ref 22–32)
Calcium: 7.5 mg/dL — ABNORMAL LOW (ref 8.9–10.3)
Chloride: 102 mmol/L (ref 98–111)
Creatinine, Ser: 0.77 mg/dL (ref 0.44–1.00)
GFR calc Af Amer: >60 mL/min (ref >60)
GFR calc non Af Amer: >60 mL/min (ref >60)
Glucose, Bld: 131 mg/dL — ABNORMAL HIGH (ref 70–99)
Potassium: 3.6 mmol/L (ref 3.5–5.1)
Sodium: 140 mmol/L (ref 135–145)
Total Bilirubin: 1.6 mg/dL — ABNORMAL HIGH (ref 0.3–1.2)
Total Protein: 5.7 g/dL — ABNORMAL LOW (ref 6.5–8.1)

## 2018-12-08 LAB — HEPARIN LEVEL (UNFRACTIONATED)
Heparin Unfractionated: <0.1 IU/mL — ABNORMAL LOW (ref 0.30–0.70)
Heparin Unfractionated: 0.14 IU/mL — ABNORMAL LOW (ref 0.30–0.70)

## 2018-12-08 LAB — CBC WITH DIFFERENTIAL/PLATELET
Abs Immature Granulocytes: 0.01 10*3/uL (ref 0.00–0.07)
Basophils Absolute: 0 10*3/uL (ref 0.0–0.1)
Basophils Relative: 1 %
Eosinophils Absolute: 0.1 10*3/uL (ref 0.0–0.5)
Eosinophils Relative: 7 %
HCT: 26.1 % — ABNORMAL LOW (ref 36.0–46.0)
Hemoglobin: 7.6 g/dL — ABNORMAL LOW (ref 12.0–15.0)
Immature Granulocytes: 1 %
Lymphocytes Relative: 19 %
Lymphs Abs: 0.4 10*3/uL — ABNORMAL LOW (ref 0.7–4.0)
MCH: 29.3 pg (ref 26.0–34.0)
MCHC: 29.1 g/dL — ABNORMAL LOW (ref 30.0–36.0)
MCV: 100.8 fL — ABNORMAL HIGH (ref 80.0–100.0)
Monocytes Absolute: 0.1 10*3/uL (ref 0.1–1.0)
Monocytes Relative: 6 %
Neutro Abs: 1.3 10*3/uL — ABNORMAL LOW (ref 1.7–7.7)
Neutrophils Relative %: 66 %
Platelets: 140 10*3/uL — ABNORMAL LOW (ref 150–400)
RBC: 2.59 MIL/uL — ABNORMAL LOW (ref 3.87–5.11)
RDW: 25.8 % — ABNORMAL HIGH (ref 11.5–15.5)
WBC: 1.9 10*3/uL — ABNORMAL LOW (ref 4.0–10.5)
nRBC: 18.5 % — ABNORMAL HIGH (ref 0.0–0.2)

## 2018-12-08 LAB — PREPARE PLATELET PHERESIS
Unit division: 0
Unit division: 0

## 2018-12-08 LAB — C-REACTIVE PROTEIN: CRP: 9.6 mg/dL — ABNORMAL HIGH (ref <1.0)

## 2018-12-08 LAB — CK: Total CK: 2461 U/L — ABNORMAL HIGH (ref 38–234)

## 2018-12-08 LAB — GLUCOSE, CAPILLARY
Glucose-Capillary: 102 mg/dL — ABNORMAL HIGH (ref 70–99)
Glucose-Capillary: 131 mg/dL — ABNORMAL HIGH (ref 70–99)
Glucose-Capillary: 131 mg/dL — ABNORMAL HIGH (ref 70–99)
Glucose-Capillary: 136 mg/dL — ABNORMAL HIGH (ref 70–99)
Glucose-Capillary: 142 mg/dL — ABNORMAL HIGH (ref 70–99)
Glucose-Capillary: 153 mg/dL — ABNORMAL HIGH (ref 70–99)
Glucose-Capillary: 158 mg/dL — ABNORMAL HIGH (ref 70–99)

## 2018-12-08 LAB — MAGNESIUM: Magnesium: 1.9 mg/dL (ref 1.7–2.4)

## 2018-12-08 MED ORDER — NOREPINEPHRINE 4 MG/250ML-% IV SOLN
INTRAVENOUS | Status: AC
  Filled 2018-12-08: qty 250

## 2018-12-08 MED ORDER — ALBUMIN HUMAN 25 % IV SOLN
50.0000 g | Freq: Once | INTRAVENOUS | Status: AC
  Administered 2018-12-08: 21:00:00 50 g via INTRAVENOUS
  Filled 2018-12-08: qty 150

## 2018-12-08 MED ORDER — FUROSEMIDE 10 MG/ML IJ SOLN
20.0000 mg | Freq: Once | INTRAMUSCULAR | Status: AC
  Administered 2018-12-08: 20 mg via INTRAVENOUS
  Filled 2018-12-08: qty 2

## 2018-12-08 MED ORDER — LIP MEDEX EX OINT
TOPICAL_OINTMENT | CUTANEOUS | Status: DC | PRN
  Filled 2018-12-08: qty 7

## 2018-12-08 MED ORDER — SODIUM CHLORIDE 0.9% FLUSH: 10.0000 mL | INTRAVENOUS | Status: DC | PRN

## 2018-12-08 MED ORDER — VASOPRESSIN 20 UNIT/ML IV SOLN
0.0300 [IU]/min | INTRAVENOUS | Status: DC
  Administered 2018-12-08: 14:00:00 0.03 [IU]/min via INTRAVENOUS
  Filled 2018-12-08 (×3): qty 2

## 2018-12-08 MED ORDER — ONDANSETRON HCL 4 MG/2ML IJ SOLN: 4.0000 mg | Freq: Four times a day (QID) | INTRAMUSCULAR | Status: DC | PRN

## 2018-12-08 MED ORDER — HEPARIN (PORCINE) 25000 UT/250ML-% IV SOLN: 1300.0000 [IU]/h | INTRAVENOUS | Status: DC

## 2018-12-08 MED ORDER — SODIUM CHLORIDE 0.9 % IV SOLN
600.0000 mg | Freq: Three times a day (TID) | INTRAVENOUS | Status: DC
  Administered 2018-12-08 – 2018-12-09 (×3): 600 mg via INTRAVENOUS
  Filled 2018-12-08 (×6): qty 600

## 2018-12-08 MED ORDER — HEPARIN (PORCINE) 25000 UT/250ML-% IV SOLN
1100.0000 [IU]/h | INTRAVENOUS | Status: DC
  Administered 2018-12-08: 1100 [IU]/h via INTRAVENOUS
  Filled 2018-12-08: qty 250

## 2018-12-08 MED ORDER — LACTATED RINGERS IV BOLUS
500.0000 mL | Freq: Once | INTRAVENOUS | Status: AC
  Administered 2018-12-08: 09:00:00 via INTRAVENOUS

## 2018-12-08 MED ORDER — NOREPINEPHRINE 4 MG/250ML-% IV SOLN
0.0000 ug/min | INTRAVENOUS | Status: DC
  Administered 2018-12-08: 17 ug/min via INTRAVENOUS
  Administered 2018-12-08: 4 ug/min via INTRAVENOUS
  Administered 2018-12-08: 12 ug/min via INTRAVENOUS
  Administered 2018-12-09: 11 ug/min via INTRAVENOUS
  Administered 2018-12-09: 6 ug/min via INTRAVENOUS
  Filled 2018-12-08 (×4): qty 250

## 2018-12-08 MED ORDER — ALBUMIN HUMAN 25 % IV SOLN
50.0000 g | Freq: Once | INTRAVENOUS | Status: DC
  Filled 2018-12-08: qty 50

## 2018-12-08 NOTE — Procedures (Addendum)
LP Indication: eval for MG Consent in Chart  Description Attempted LP x 3 in sterile fashion with Dr. Nelda Marseille, unsuccessful related to habitus and prior lumbar surgery.  Consider fluoro guided tap by IR if clinically indicated.  No immediate complications  Erskine Emery MD

## 2018-12-08 NOTE — Progress Notes (Signed)
ID PROGRESS NOTE  ID: [id number] F with severe COVID-19 disease, respiratory failure vent dependent complicated by MRSA complicated bacteremia concern for endocarditis, initially on vancomycin but could have caused leukopenia/thrombocytopenia thus switched to daptomycin, however her BL CK at 2200 now up to 2400  abtx day #8:  24hr: underwent trach placement, and attempted LP but unsuccessful.  Afebrile  Labs show mild improvement in platelets buts WBC still low.  BP 118/78   Pulse 97   Temp 99.8 F (37.7 C) (Oral)   Resp (!) 30   Ht 5\' 3"  (1.6 m)   Wt 115.9 kg   SpO2 100%   BMI 45.26 kg/m  tmax of 100.2F yesterday Did not examine   Labs: 8/10: blood cx ngtd  A/P:  mrsa bacteremia = plan to change to ceftaroline 600mg  Q8hr. Plan to treat for minimum of 4 wk but will likely extend to 6 wk if unable to rule out endocarditis. Would still like to have her get TEE towards 4 -6wk mark.  Elzie Rings [facility] [phone]

## 2018-12-08 NOTE — Progress Notes (Addendum)
PROGRESS NOTE    SEAIRA [name]  OZH:[id number] DOB: Jun 25, 1950 DOA: 11/27/2018 PCP: Nicholos Johns, MD   Brief Narrative:  68 y.o.BF PMHx morbid obesity, DM type II, COPD not on oxygen, HTN, chronic atrial fibrillation CHAD2VASc~4, on Xarelto and Cardizem,spine surgeries in the past,chronic fluid retention, history of Graves' disease now hypothyroid, history of CVA, history of DVT who lives at [facility] and was likely exposed to COVID-19 infection about 10 to 14 days prior to this admission.     She was experiencing fever, body aches and developed shortness of breath.  She finally decided to come into the emergency department at [facility].  She was found to be positive for COVID-19.  Was noted to have hypoxia.  She was hospitalized for further management.    Subjective: 8/11 alert, nods yes and no to questions.  Trach in place.  Follows commands.  Overnight afebrile   Assessment & Plan:   Active Problems:   Essential hypertension   Acute respiratory failure with hypoxemia (HCC)   PAF (paroxysmal atrial fibrillation) (HCC)   Pneumonia due to COVID-19 virus   SOB (shortness of breath)   Ventricular tachyarrhythmia (HCC)   Intubation of airway performed without difficulty   Subcutaneous emphysema (HCC)   Pneumomediastinum (HCC)   Pressure injury of skin   MRSA bacteremia   Atrial fibrillation, chronic   Anasarca   Acute renal failure superimposed on stage 3 chronic kidney disease ([place])   Controlled type 2 diabetes mellitus with hyperglycemia (HCC)   Acute blood loss anemia   Thrombocytopenia (HCC)   Hemiparesis (HCC)   Elevated CK   Diabetes mellitus type 2, controlled, with complications ([place])  Acute respiratory failure with hypoxia/COVID 19 pneumonia/ARDS -Completed course Remdesivir -Dexamethasone started 7/22 6 mg daily tapered and stopped at 14 days. -Actemra x2 doses 7/22 and 7/23 -Continue vitamins per COVID protocol -Afebrile overnight - 8/6 PCXR  shows worsening pneumonia see results below - 8/8 continue daily weaning trials - 8/8 patient now off continuous Versed, Precedex, will wean patient off of clonazepam. -Fentanyl as needed,e Vrsed PRN, oxycodone IR as needed -8/8 wean patient off of clonazepam  Recent Labs  Lab 12/04/18 0500 12/05/18 0500 12/06/18 0453 12/07/18 0500 12/08/18 0507  CRP 1.7* 6.1* 5.3* 9.0* 9.6*   Recent Labs  Lab 12/04/18 0500 12/05/18 0500 12/06/18 0635 12/07/18 0500 12/08/18 0506  DDIMER 17.45* 18.07* 12.97* >20.00* >20.00*  -8/11 s/p tracheostomy and LP - 8/11 PCXR continued bilateral opacities see results below  Asthma vs COPD - See acute respiratory failure  Subcutaneous emphysema/pneumomediastinum - 7/26 diagnosis subcutaneous emphysema.  CXR also suggested mediastinal emphysema.  CXR findings have been stable - Conservative management no indication for chest tube -8/6 PCXR: See acute respiratory failure -8/10 PCXR: mild interval progression of bilateral airspace opacities.  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  Stable   Bilateral hemiparalysis - Patient unable to move bilateral upper extremities or bilateral lower extremities.  States she can feel when she is being touched on her extremities but does not register pain. - Neurology; recommends LP, his DDX critical care myopathy vs neuropathy, vs Gilliam Barr syndrome vs infection -8/10 scheduled for LP post placement tracheostomy tube  MRSA bacteremia -Central/PIC line removed -Patient placed on appropriate antibiotics -Patient will receive 24 to 48-hour line holiday - Blood culture NGTD see below - 7/24 echocardiogram negative vegetation.  However patient diagnosed with MRSA bacteremia  on 8/2 repeat echocardiogram most likely will be required.  Would prefer TEE.  Will need to discuss with cardiology - 8/2 repeat blood cultures  positive staph aureus speciation and susceptibilities pending. -8/7 discussed case with cardiology recommend empiric coverage for possible endocarditis.  Will not perform TEE at this time as patient has COVID and procedure is an aerosol lysing procedure. -8/7 discussed case with Dr. Carlyle Basques, ID concerning restarting antibiotics.  Does not believe patient is a vancomycin failure therefore recommends restarting vancomycin for minimum of 4 weeks treatment. -8/8 Dr. Carlyle Basques ID has changed vancomycin---> daptomycin for a 4 to 6-week course of antibiotics. - Recommend at day 21 again request that cardiology performed TEE. - 8/10 despite elevation in CK ID recommends continuation of daptomycin  Elevated CK -Monitor closely Recent Labs  Lab 12/05/18 1400 12/07/18 0500 12/08/18 0506  CKTOTAL 2,215* 2,387* 2,461*  - Daptomycin may be exacerbating elevated CK.  If he continues to elevate reconsult ID Dr. Baxter Flattery  Enterovesical Fistula ? -Some concern that patient may have a fistula, but I am not fully convinced patient has such fistula.  Usually patient would have a significant leukocytosis, fever etc.  But given that she has been on immunosuppressive medication we will proceed as if fistula possible. - Obtain urinalysis - Obtain urine culture - 8/7 discussed case with radiology they recommended CT abdomen/pelvis with oral and IV contrast. -Dependent upon findings consult GI or GYN.  -8/8 CT abdomen with: Inconclusive, see results below will consult GI on 8/9 discussed case going forward. -8/10 will address once patient more stable in regards to placement of tracheostomy, LP, neurological deficits identified and corrected.  Chronic A. fib with episodes of V. tach - At home was treated with Xarelto -Amiodarone drip -Digoxin 0.125 mg daily -Metoprolol 2.5 mg QID -Metoprolol PRN - Currently rate controlled - 8/5 all anticoagulants held currently.  Discussed case with RN and patient is  continuing to bleed, pure wick looked like a tampon when it was changed out.  Will reassess starting anticoagulant in next 24 to 48 hours. - Patient also thrombocytopenic if she continues to bleed will transfuse unit of platelets.  -Platelets low normal prior to admission -CHADS2VASc score~3 -8/6 bleeding has stabilized continue to hold all anticoagulants for another 24 hours will then make decision on whether to restart. -8/11 restart heparin - Strict in and out +12.3 L -Daily weight Filed Weights   12/05/18 0500 12/06/18 0500 12/08/18 0500  Weight: 118.5 kg 117.2 kg 115.9 kg  -8/8 Albumin 50 g + Lasix 60 mg.  Continue PRN albumin+ Lasix -No central line access therefore cannot obtain CVP  - 8/11 gently diurese Albumin 50 g+ Lasix 20 mg x 1  Anasarca  -See chronic A. fib  Hypotension - Midodrine 5 mg 3 times daily -Discontinue scheduled metoprolol  Hypokalemia -Potassium goal> 4  Hypomagnesmia -Magnesium goal> 2  Acute on CKD stage III ?  (Cr 12/25/2016 0.90) -Unknown baseline renal function -Baseline renal function not entirely known.  Recent Labs  Lab 12/03/18 0500 12/04/18 0500 12/05/18 0500 12/06/18 0453 12/07/18 0500  CREATININE 0.85 0.75 0.69 0.82 0.79  -Improved  Hypothyroidism -Continue levothyroxine.   -As noted above her TSH was noted to be low.  Free T4 is 1.0.  No further work-up.  Low TSH likely sick euthyroid.  Hx stroke  -Patient was on Xarelto and aspirin, have been switched over to IV heparin however given her ongoing bleeding. All anticoagulants on hold.   History  of vitamin D deficiency Continue supplementation.  Hx DVT -Xarelto on hold secondary to bleeding   Diabetes type 2 controlled with hyperglycemia -7/22 hemoglobin A1c = 7.0  -  Levemir 40 units BID - 8/10 NovoLog 10 units Q 4 hours (hold) - 8/10 decrease to sensitive SSI   Morbid obesity -Body mass index is 46.2 kg/m.   Nutrition -Continue tube feedings.   Constipation resolved -Continue bowel regimen.  Mildly elevated AST. -Monitor for now.  Acute blood loss anemia/Bleeding from oral cavity/melena -Patient had been on Xarelto and heparin all anticoagulants discontinued -Resolved  -Hemoglobin on admission was 12.6. Recent Labs  Lab 12/05/18 0500 12/06/18 0453 12/06/18 0517 12/07/18 0500 12/08/18 0506  HGB 8.4* 8.1* 8.5* 7.6* 7.6*  - Transfuse for hemoglobin<7  Acute thrombocytopenia - Most likely secondary to MRSA bacteremia -HIT panel negative  -8/10 transfused 2 units of platelets  Leukocytosis - Most likely secondary to infection - We will type and cross      DVT prophylaxis: Heparin  Code Status: Partial Family Communication 8/11: Spoke Marciano Sequin daughter informed her that tracheostomy and LP completed today, discuss plan of care answered all questions.   Disposition Plan: TBD   Consultants:  PCCM Cardiology phone consult Dr. Buford Dresser Neurology Dr. Kathrynn Speed    Procedures/Significant Events:  7/23 intubation >Central line insertion 11/19/2018 >Cortrack replacement 11/25/2018 >Transthoracic echocardiogram 7/24 1. The left ventricle has hyperdynamic systolic function, with an ejection fraction of >65%. The cavity size was normal. Left ventricular diastolic Doppler parameters are consistent with impaired relaxation. 2. The right ventricle has normal systolc function. The cavity was normal. There is no increase in right ventricular wall thickness. Right ventricular systolic pressure is normal. 3. Mild thickening of the aortic valve. No stenosis of the aortic valve. 4. The aortic root and ascending aorta are normal in size and structure. > Limited echocardiogram 12/01/2018 no vegetation. 8/6 PCXR: New LEFT supra clavicular soft tissue emphysema, no visible pneumothorax - Worsening pneumonia, chronically elevated RIGHT diaphragm 8/7 CT abdomen pelvis W contrast:Nonspecific thickening of  the urinary bladder.-no evidence of contrast fistulation from the urinary bladder to the bowel on delayed urographic contrast phase, however this does not exclude the presence of enterovesical fistula. There is no air within the urinary bladder, which is highly characteristic of fistula. More definitive evaluation will require fluoroscopic cystogram and/or contrast enema when clinically appropriate. -colon is fluid-filled to the rectum, in keeping with diarrheal illness, and a rectal tube is in place. -extensive, dense consolidation and ground-glass opacity in the included bilateral lung bases. 8/10 PCXR- mild interval progression of bilateral airspace opacities - Stable support apparatus 8/11 PCXR:- Stable ELEVATED RIGHT hemidiaphragm. - Stable bilateral lung opacities RIGHT>> LEFT   I have personally reviewed and interpreted all radiology studies and my findings are as above.  VENTILATOR SETTINGS: Vent mode: PCV Set rate: 24 FiO2: 60% I time: 0.8 Pressure control: 14 PEEP: 8     Cultures 8/4 blood RIGHT handx2 positive staph aureus 8/6 blood RIGHT arm positive MRSA x2 8/9 blood RIGHT antecubital NGTD 8/9 blood LEFT HAND NGTD 8/10 CSF NGTD 8/11 tracheal aspirate pending    Antimicrobials: Anti-infectives (From admission, onward)   Start     Stop   12/06/18 0000  vancomycin (VANCOCIN) 1,250 mg in sodium chloride 0.9 % 250 mL IVPB  Status:  Discontinued     12/05/18 1317   12/05/18 2000  DAPTOmycin (CUBICIN) 948 mg in sodium chloride 0.9 % IVPB  12/04/18 0000  vancomycin (VANCOCIN) IVPB 1000 mg/200 mL premix  Status:  Discontinued     12/05/18 1238   12/02/18 1400  vancomycin (VANCOCIN) 1,750 mg in sodium chloride 0.9 % 500 mL IVPB  Status:  Discontinued     12/02/18 1151   12/02/18 1300  vancomycin (VANCOCIN) IVPB 1000 mg/200 mL premix  Status:  Discontinued     12/03/18 1505   11/30/18 1430  vancomycin (VANCOCIN) 2,500 mg in sodium chloride 0.9 % 500 mL IVPB      11/30/18 1711   11/19/18 2030  remdesivir 100 mg in sodium chloride 0.9 % 250 mL IVPB     11/22/18 2204   11/12/2018 2030  remdesivir 200 mg in sodium chloride 0.9 % 250 mL IVPB     10/29/2018 2056        Devices    LINES / TUBES:  #6 trach 8/10>>>    Continuous Infusions: . sodium chloride 10 mL/hr at 12/08/18 0700  . DAPTOmycin (CUBICIN)  IV Stopped (12/07/18 2206)  . fentaNYL infusion INTRAVENOUS 100 mcg/hr (12/08/18 0700)  . norepinephrine (LEVOPHED) Adult infusion 4 mcg/min (12/08/18 0811)  . phenylephrine (NEO-SYNEPHRINE) Adult infusion 90 mcg/min (12/08/18 0705)     Objective: Vitals:   12/08/18 0615 12/08/18 0630 12/08/18 0645 12/08/18 0700  BP: (!) 105/42 (!) 95/41 (!) 96/41 (!) 107/48  Pulse: (!) 109 (!) 103 100 (!) 102  Resp: (!) 32 (!) 31 (!) 30 (!) 35  Temp:      TempSrc:      SpO2: 92% 98% 99% 95%  Weight:      Height:        Intake/Output Summary (Last 24 hours) at 12/08/2018 0827 Last data filed at 12/08/2018 0700 Gross per 24 hour  Intake 2340.4 ml  Output 1300 ml  Net 1040.4 ml   Filed Weights   12/05/18 0500 12/06/18 0500 12/08/18 0500  Weight: 118.5 kg 117.2 kg 115.9 kg   Physical Exam:  General: Alert, follows commands, positive acute respiratory distress Eyes: negative scleral hemorrhage, negative anisocoria, negative icterus ENT: Negative Runny nose, negative gingival bleeding, Neck:  Negative scars, masses, torticollis, lymphadenopathy, JVD #6 trach cuffed in place negative sign of infection or bleeding. Lungs: Clear to auscultation bilaterally without wheezes or crackles Cardiovascular: Regular rate and rhythm without murmur gallop or rub normal S1 and S2 Abdomen: negative abdominal pain, nondistended, positive soft, bowel sounds, no rebound, no ascites, no appreciable mass Extremities: No significant cyanosis, clubbing, or edema bilateral lower extremities Skin: Negative rashes, lesions, ulcers Psychiatric:  Negative depression,  negative anxiety, negative fatigue, negative mania  Central nervous system:  Cranial nerves II through XII intact, tongue/uvula midline, all extremities muscle strength 5/5, sensation intact throughout,receptive aphasia.      .     Data Reviewed: Care during the described time interval was provided by me .  I have reviewed this patient's available data, including medical history, events of note, physical examination, and all test results as part of my evaluation.   CBC: Recent Labs  Lab 12/04/18 0500 12/05/18 0500 12/06/18 0453 12/06/18 0517 12/07/18 0500 12/08/18 0506  WBC 3.5* 3.3* 2.2*  --  2.7* 1.9*  NEUTROABS 2.5 2.2 1.4*  --  1.8 1.3*  HGB 8.6* 8.4* 8.1* 8.5* 7.6* 7.6*  HCT 29.8* 28.7* 26.9* 25.0* 25.2* 26.1*  MCV 98.3 98.3 97.8  --  99.2 100.8*  PLT 56* 55* 57*  --  69* 812*   Basic Metabolic Panel: Recent Labs  Lab  12/03/18 0500 12/04/18 0500 12/05/18 0500 12/06/18 0453 12/06/18 0517 12/07/18 0500 12/08/18 0506  NA 143 143 141 141 140 141  --   K 4.4 4.3 4.5 3.8 3.6 3.9  --   CL 102 102 103 100  --  101  --   CO2 33* 33* 30 31  --  30  --   GLUCOSE 177* 135* 114* 122*  --  61*  --   BUN 70* 47* 40* 36*  --  39*  --   CREATININE 0.85 0.75 0.69 0.82  --  0.79  --   CALCIUM 7.5* 7.5* 7.4* 7.5*  --  7.6*  --   MG 2.5* 2.1 2.0 1.8  --  2.1 1.9   GFR: Estimated Creatinine Clearance: 82.7 mL/min (by C-G formula based on SCr of 0.79 mg/dL). Liver Function Tests: Recent Labs  Lab 12/02/18 0510 12/03/18 0500 12/04/18 0500 12/05/18 0500 12/06/18 0453  AST 85* 64* 80* 113* 119*  ALT 97* 100* 99* 112* 109*  ALKPHOS 61 58 63 66 64  BILITOT 1.3* 1.1 1.4* 1.5* 1.6*  PROT 4.7* 4.7* 4.7* 4.6* 4.8*  ALBUMIN 2.6* 2.4* 2.2* 2.1* 2.4*   No results for input(s): LIPASE, AMYLASE in the last 168 hours. No results for input(s): AMMONIA in the last 168 hours. Coagulation Profile: Recent Labs  Lab 12/06/18 0635  INR 1.1   Cardiac Enzymes: Recent Labs  Lab  12/05/18 1400 12/07/18 0500 12/08/18 0506  CKTOTAL 2,215* 2,387* 2,461*   BNP (last 3 results) No results for input(s): PROBNP in the last 8760 hours. HbA1C: No results for input(s): HGBA1C in the last 72 hours. CBG: Recent Labs  Lab 12/07/18 1558 12/07/18 1930 12/07/18 2356 12/08/18 0333 12/08/18 0758  GLUCAP 81 158* 153* 142* 102*   Lipid Profile: No results for input(s): CHOL, HDL, LDLCALC, TRIG, CHOLHDL, LDLDIRECT in the last 72 hours. Thyroid Function Tests: No results for input(s): TSH, T4TOTAL, FREET4, T3FREE, THYROIDAB in the last 72 hours. Anemia Panel: Recent Labs    12/07/18 0500  VITAMINB12 5,682*   Urine analysis:    Component Value Date/Time   COLORURINE YELLOW 12/07/2018 0330   APPEARANCEUR CLEAR 12/07/2018 0330   LABSPEC 1.015 12/07/2018 0330   PHURINE 8.0 12/07/2018 0330   GLUCOSEU NEGATIVE 12/07/2018 0330   HGBUR MODERATE (A) 12/07/2018 0330   BILIRUBINUR NEGATIVE 12/07/2018 0330   KETONESUR NEGATIVE 12/07/2018 0330   PROTEINUR 30 (A) 12/07/2018 0330   UROBILINOGEN 1.0 06/20/2014 0442   NITRITE NEGATIVE 12/07/2018 0330   LEUKOCYTESUR TRACE (A) 12/07/2018 0330   Sepsis Labs: @LABRCNTIP (procalcitonin:4,lacticidven:4)  ) Recent Results (from the past 240 hour(s))  Culture, blood (routine x 2)     Status: Abnormal   Collection Time: 11/29/18 10:55 PM   Specimen: BLOOD  Result Value Ref Range Status   Specimen Description BLOOD RIGHT ANTECUBITAL  Final   Special Requests   Final    BOTTLES DRAWN AEROBIC AND ANAEROBIC Blood Culture adequate volume   Culture  Setup Time   Final    GRAM POSITIVE COCCI IN CLUSTERS IN BOTH AEROBIC AND ANAEROBIC BOTTLES CRITICAL RESULT CALLED TO, READ BACK BY AND VERIFIED WITH: [facility] CAREN AMEND [id number] FCP Performed at [facility], [place] [address], [place], [place] [address]    Culture METHICILLIN RESISTANT STAPHYLOCOCCUS AUREUS (A)  Final   Report Status 12/02/2018 FINAL  Final   Organism ID,  Bacteria METHICILLIN RESISTANT STAPHYLOCOCCUS AUREUS  Final      Susceptibility   Methicillin  resistant staphylococcus aureus - MIC*    CIPROFLOXACIN <=0.5 SENSITIVE Sensitive     ERYTHROMYCIN >=8 RESISTANT Resistant     GENTAMICIN <=0.5 SENSITIVE Sensitive     OXACILLIN >=4 RESISTANT Resistant     TETRACYCLINE <=1 SENSITIVE Sensitive     VANCOMYCIN 1 SENSITIVE Sensitive     TRIMETH/SULFA <=10 SENSITIVE Sensitive     CLINDAMYCIN <=0.25 SENSITIVE Sensitive     RIFAMPIN <=0.5 SENSITIVE Sensitive     Inducible Clindamycin NEGATIVE Sensitive     * METHICILLIN RESISTANT STAPHYLOCOCCUS AUREUS  Blood Culture ID Panel (Reflexed)     Status: Abnormal   Collection Time: 11/29/18 10:55 PM  Result Value Ref Range Status   Enterococcus species NOT DETECTED NOT DETECTED Final   Listeria monocytogenes NOT DETECTED NOT DETECTED Final   Staphylococcus species DETECTED (A) NOT DETECTED Final    Comment: CRITICAL RESULT CALLED TO, READ BACK BY AND VERIFIED WITH: PHAR[facility] CAREN AMEND [id number] FCP    Staphylococcus aureus (BCID) DETECTED (A) NOT DETECTED Final    Comment: Methicillin (oxacillin)-resistant Staphylococcus aureus (MRSA). MRSA is predictably resistant to beta-lactam antibiotics (except ceftaroline). Preferred therapy is vancomycin unless clinically contraindicated. Patient requires contact precautions if  hospitalized. CRITICAL RESULT CALLED TO, READ BACK BY AND VERIFIED WITH: PHARMD CAREN AMEND [id number] FCP    Methicillin resistance DETECTED (A) NOT DETECTED Final    Comment: CRITICAL RESULT CALLED TO, READ BACK BY AND VERIFIED WITH: PHARMD CAREN AMEND [id number] FCP    Streptococcus species NOT DETECTED NOT DETECTED Final   Streptococcus agalactiae NOT DETECTED NOT DETECTED Final   Streptococcus pneumoniae NOT DETECTED NOT DETECTED Final   Streptococcus pyogenes NOT DETECTED NOT DETECTED Final   Acinetobacter baumannii NOT DETECTED NOT DETECTED Final   Enterobacteriaceae species  NOT DETECTED NOT DETECTED Final   Enterobacter cloacae complex NOT DETECTED NOT DETECTED Final   Escherichia coli NOT DETECTED NOT DETECTED Final   Klebsiella oxytoca NOT DETECTED NOT DETECTED Final   Klebsiella pneumoniae NOT DETECTED NOT DETECTED Final   Proteus species NOT DETECTED NOT DETECTED Final   Serratia marcescens NOT DETECTED NOT DETECTED Final   Haemophilus influenzae NOT DETECTED NOT DETECTED Final   Neisseria meningitidis NOT DETECTED NOT DETECTED Final   Pseudomonas aeruginosa NOT DETECTED NOT DETECTED Final   Candida albicans NOT DETECTED NOT DETECTED Final   Candida glabrata NOT DETECTED NOT DETECTED Final   Candida krusei NOT DETECTED NOT DETECTED Final   Candida parapsilosis NOT DETECTED NOT DETECTED Final   Candida tropicalis NOT DETECTED NOT DETECTED Final    Comment: Performed at [facility], [place]. [address], [place], [place] [address]  Culture, blood (routine x 2)     Status: Abnormal   Collection Time: 11/29/18 11:05 PM   Specimen: BLOOD RIGHT HAND  Result Value Ref Range Status   Specimen Description BLOOD RIGHT HAND  Final   Special Requests   Final    BOTTLES DRAWN AEROBIC AND ANAEROBIC Blood Culture adequate volume   Culture  Setup Time   Final    GRAM POSITIVE COCCI IN BOTH AEROBIC AND ANAEROBIC BOTTLES CRITICAL VALUE NOTED.  VALUE IS CONSISTENT WITH PREVIOUSLY REPORTED AND CALLED VALUE.    Culture (A)  Final    STAPHYLOCOCCUS AUREUS SUSCEPTIBILITIES PERFORMED ON PREVIOUS CULTURE WITHIN THE LAST 5 DAYS. Performed at [facility], [place] [address] [place], [place] [address]    Report Status 12/02/2018 FINAL  Final  Culture, blood (Routine X 2) w Reflex to  ID Panel     Status: Abnormal   Collection Time: 12/01/18  2:14 PM   Specimen: BLOOD RIGHT HAND  Result Value Ref Range Status   Specimen Description   Final    BLOOD RIGHT HAND Performed at [facility], [place] [address], [place], [place] [address]    Special Requests   Final     BOTTLES DRAWN AEROBIC AND ANAEROBIC Blood Culture adequate volume Performed at [facility] [address], [place], [place] [address]    Culture  Setup Time   Final    GRAM POSITIVE COCCI ANAEROBIC BOTTLE ONLY CRITICAL VALUE NOTED.  VALUE IS CONSISTENT WITH PREVIOUSLY REPORTED AND CALLED VALUE.    Culture (A)  Final    STAPHYLOCOCCUS AUREUS SUSCEPTIBILITIES PERFORMED ON PREVIOUS CULTURE WITHIN THE LAST 5 DAYS. Performed at [facility], [place] [address] [place], [place] [address]    Report Status 12/04/2018 FINAL  Final  Culture, blood (Routine X 2) w Reflex to ID Panel     Status: Abnormal   Collection Time: 12/01/18  2:20 PM   Specimen: BLOOD  Result Value Ref Range Status   Specimen Description   Final    BLOOD RIGHT HAND Performed at [facility] [address] [place], [place] [address]    Special Requests   Final    BOTTLES DRAWN AEROBIC AND ANAEROBIC Blood Culture adequate volume Performed at [facility] [address] [place], [place] [address]    Culture  Setup Time   Final    IN BOTH AEROBIC AND ANAEROBIC BOTTLES GRAM POSITIVE COCCI CRITICAL VALUE NOTED.  VALUE IS CONSISTENT WITH PREVIOUSLY REPORTED AND CALLED VALUE.    Culture (A)  Final    STAPHYLOCOCCUS AUREUS SUSCEPTIBILITIES PERFORMED ON PREVIOUS CULTURE WITHIN THE LAST 5 DAYS. Performed at [facility], [place] [address] [place], [place] [address]    Report Status 12/04/2018 FINAL  Final  Culture, blood (Routine X 2) w Reflex to ID Panel     Status: Abnormal   Collection Time: 12/03/18  4:16 PM   Specimen: BLOOD  Result Value Ref Range Status   Specimen Description   Final    BLOOD RIGHT ARM Performed at [facility] [address], [place], [place] [address]    Special Requests   Final    BOTTLES DRAWN AEROBIC AND ANAEROBIC Blood Culture adequate volume Performed at [facility]  [address], [place], [place] [address]    Culture  Setup Time   Final    GRAM POSITIVE COCCI ANAEROBIC BOTTLE ONLY CRITICAL VALUE NOTED.  VALUE IS CONSISTENT WITH PREVIOUSLY REPORTED AND CALLED VALUE.    Culture (A)  Final    STAPHYLOCOCCUS AUREUS SUSCEPTIBILITIES PERFORMED ON PREVIOUS CULTURE WITHIN THE LAST 5 DAYS. Performed at [facility], [place] [address], [place],  [address]    Report Status 12/07/2018 FINAL  Final  Culture, blood (Routine X 2) w Reflex to ID Panel     Status: Abnormal (Preliminary result)   Collection Time: 12/03/18  4:29 PM   Specimen: BLOOD  Result Value Ref Range Status   Specimen Description   Final    BLOOD RIGHT ARM Performed at [facility] [address] [place],  [address]    Special Requests   Final    BOTTLES DRAWN AEROBIC AND ANAEROBIC Blood Culture adequate volume Performed at [facility]  [address] [place], [place] [address]    Culture  Setup Time   Final    GRAM POSITIVE COCCI IN CLUSTERS ANAEROBIC BOTTLE ONLY CRITICAL VALUE NOTED.  VALUE IS CONSISTENT WITH PREVIOUSLY REPORTED AND CALLED VALUE. Performed at [facility], [place] [address] [place], [place] [id number]    Culture STAPHYLOCOCCUS AUREUS (A)  Final   Report Status PENDING  Incomplete  Culture, blood (routine x 2)     Status: None (Preliminary result)   Collection Time: 12/06/18  5:45 PM   Specimen: BLOOD  Result Value Ref Range Status   Specimen Description   Final    BLOOD RIGHT ANTECUBITAL Performed at [facility] [address], [place], [place] [address]    Special Requests   Final    BOTTLES DRAWN AEROBIC ONLY Blood Culture adequate volume Performed at [facility] [address] [place], [place] [address]    Culture   Final    NO GROWTH < 24 HOURS Performed at [facility] [address], [place], [place] [address]    Report Status PENDING  Incomplete   Culture, blood (routine x 2)     Status: None (Preliminary result)   Collection Time: 12/06/18  5:55 PM   Specimen: BLOOD LEFT HAND  Result Value Ref Range Status   Specimen Description   Final    BLOOD LEFT HAND Performed at [facility], [place] [address], [place], [place] [address]    Special Requests   Final    BOTTLES DRAWN AEROBIC ONLY Blood Culture adequate volume Performed at [facility] [address] [place], [place] [address]    Culture   Final    NO GROWTH < 24 HOURS Performed at [facility] [address] [place], [place] [address]    Report Status PENDING  Incomplete  Gram stain     Status: None   Collection Time: 12/07/18  9:52 AM   Specimen: Fluid  Result Value Ref Range Status   Specimen Description   Final    FLUID Performed at [facility] [address] [place], [place] [address]    Special Requests   Final    PLATELET [id number] Performed at [facility], [place] [address], [place], [place] [address]    Gram Stain   Final    NO WBC SEEN NO ORGANISMS SEEN CYTOSPIN SMEAR Performed at [facility], [place] [address] [place], [place] [address]    Report Status 12/07/2018 FINAL  Final         Radiology Studies: Dg Chest Port 1 View  Result Date: 12/07/2018 CLINICAL DATA:  Tracheostomy tube EXAM: PORTABLE CHEST 1 VIEW COMPARISON:  December 07, 2018 FINDINGS: There is interval placement of a tracheostomy tube with the tip seen at just at the level of the superior clavicular heads. Enteric tube is seen below the level of the diaphragm. Overall shallow degree of aeration with patchy airspace opacities bilaterally. There is bronchial wall thickening seen within the perihilar regions. No change in cardiomediastinal silhouette. No acute osseous abnormality. IMPRESSION: Interval placement of tracheostomy tube with the tip seen at the superior clavicular heads. Unchanged bilateral  airspace opacities. Electronically Signed   By: Prudencio Pair M.D.   On: 12/07/2018 16:24   Dg Chest Port 1 View  Result Date: 12/07/2018 CLINICAL DATA:  Subcutaneous emphysema EXAM: PORTABLE CHEST 1 VIEW COMPARISON:  12/05/2018 FINDINGS: Endotracheal tube terminates approximately 2.0  cm superior to the carina. Enteric tube courses below the diaphragm, distal tip beyond the inferior margin of the film. Multiple overlying leads. Cardiomediastinal silhouette is largely obscured. Lung volumes are low. Extensive bilateral airspace opacities predominantly within the lung bases, slightly progressed compared to prior study. No pneumothorax is identified. IMPRESSION: 1. Mild interval progression of bilateral airspace opacities. 2. Stable support apparatus. Electronically Signed   By: Davina Poke M.D.   On: 12/07/2018 10:27        Scheduled Meds: . sodium chloride   Intravenous Once  . sodium chloride   Intravenous Once  . amiodarone  200 mg Per Tube BID  . chlorhexidine  15 mL Mouth/Throat BID  . digoxin  0.125 mg Intravenous Daily  . etomidate  40 mg Intravenous Once  . feeding supplement (PRO-STAT SUGAR FREE 64)  30 mL Per Tube QID  . feeding supplement (VITAL AF 1.2 CAL)  1,000 mL Per Tube Q24H  . ferrous sulfate  300 mg Per Tube TID WC  . free water  300 mL Per Tube Q6H  . gabapentin  100 mg Per Tube Q8H  . insulin aspart  0-9 Units Subcutaneous Q4H  . insulin detemir  40 Units Subcutaneous BID  . levothyroxine  112 mcg Per Tube Q0600  . mouth rinse  15 mL Mouth Rinse 10 times per day  . midodrine  5 mg Per Tube Q8H  . multivitamin with minerals  1 tablet Per Tube Daily  . pantoprazole sodium  40 mg Per Tube BID  . polyethylene glycol  17 g Per Tube Daily  . propofol  500 mg Intravenous Once  . sodium chloride flush  10-40 mL Intracatheter Q12H  . vitamin C  500 mg Per Tube Daily  . zinc sulfate  220 mg Per Tube Daily   Continuous Infusions: . sodium chloride 10 mL/hr at 12/08/18  0700  . DAPTOmycin (CUBICIN)  IV Stopped (12/07/18 2206)  . fentaNYL infusion INTRAVENOUS 100 mcg/hr (12/08/18 0700)  . norepinephrine (LEVOPHED) Adult infusion 4 mcg/min (12/08/18 0811)  . phenylephrine (NEO-SYNEPHRINE) Adult infusion 90 mcg/min (12/08/18 0705)     LOS: 20 days   The patient is critically ill with multiple organ systems failure and requires high complexity decision making for assessment and support, frequent evaluation and titration of therapies, application of advanced monitoring technologies and extensive interpretation of multiple databases. Critical Care Time devoted to patient care services described in this note  Time spent: 40 minutes     Lindell Renfrew, Geraldo Docker, MD [facility] Pager [phone]  If 7PM-7AM, please contact night-coverage [email or web address] Password [facility] 12/08/2018, 8:27 AM

## 2018-12-08 NOTE — Progress Notes (Signed)
PT Cancellation Note  Patient Details Name: [name] [name] MRN: [id number] DOB: May 14, 1950   Cancelled Treatment:    Reason Eval/Treat Not Completed: Patient not medically ready(per nursing not ready for mobility due to low BP)   [place] 12/08/2018, 1:19 PM  [place] Pager [phone] Office [phone]

## 2018-12-08 NOTE — Progress Notes (Signed)
ANTICOAGULATION CONSULT NOTE - Follow Up Consult  Pharmacy Consult for Heparin IV Indication: atrial fibrillation  (Hx of DVT on PTA Xarelto)  Allergies  Allergen Reactions  . Sulfonamide Derivatives Itching    Patient Measurements: Height: 5\' 3"  (160 cm) Weight: 255 lb 8.2 oz (115.9 kg) IBW/kg (Calculated) : 52.4 Heparin Dosing Weight: 80.5 kg  Vital Signs: Temp: 100.3 F (37.9 C) (08/11 0300) Temp Source: Oral (08/11 0300) BP: 107/38 (08/11 0825) Pulse Rate: 95 (08/11 0825)  Labs: Recent Labs    12/05/18 1400 12/05/18 1805  12/06/18 0453 12/06/18 0517 12/06/18 0635 12/07/18 0500 12/08/18 0506 12/08/18 0507  HGB  --   --    < > 8.1* 8.5*  --  7.6* 7.6*  --   HCT  --   --    < > 26.9* 25.0*  --  25.2* 26.1*  --   PLT  --   --   --  57*  --   --  69* 140*  --   APTT  --   --   --   --   --  27  --   --   --   LABPROT  --   --   --   --   --  14.0  --   --   --   INR  --   --   --   --   --  1.1  --   --   --   HEPARINUNFRC  --  0.21*  --   --   --   --  <0.10*  --  <0.10*  CREATININE  --   --   --  0.82  --   --  0.79 0.77  --   CKTOTAL 2,215*  --   --   --   --   --  2,387* 2,461*  --    < > = values in this interval not displayed.    Estimated Creatinine Clearance: 82.7 mL/min (by C-G formula based on SCr of 0.77 mg/dL).   Medications:  Scheduled:  . sodium chloride   Intravenous Once  . sodium chloride   Intravenous Once  . amiodarone  200 mg Per Tube BID  . chlorhexidine  15 mL Mouth/Throat BID  . digoxin  0.125 mg Intravenous Daily  . etomidate  40 mg Intravenous Once  . feeding supplement (PRO-STAT SUGAR FREE 64)  30 mL Per Tube QID  . feeding supplement (VITAL AF 1.2 CAL)  1,000 mL Per Tube Q24H  . ferrous sulfate  300 mg Per Tube TID WC  . gabapentin  100 mg Per Tube Q8H  . insulin aspart  0-9 Units Subcutaneous Q4H  . insulin detemir  40 Units Subcutaneous BID  . levothyroxine  112 mcg Per Tube Q0600  . mouth rinse  15 mL Mouth Rinse 10 times  per day  . midodrine  5 mg Per Tube Q8H  . multivitamin with minerals  1 tablet Per Tube Daily  . pantoprazole sodium  40 mg Per Tube BID  . polyethylene glycol  17 g Per Tube Daily  . propofol  500 mg Intravenous Once  . sodium chloride flush  10-40 mL Intracatheter Q12H  . vitamin C  500 mg Per Tube Daily  . zinc sulfate  220 mg Per Tube Daily   Infusions:  . sodium chloride 10 mL/hr at 12/08/18 0700  . DAPTOmycin (CUBICIN)  IV Stopped (12/07/18 2206)  . fentaNYL infusion INTRAVENOUS 100  mcg/hr (12/08/18 0700)  . norepinephrine (LEVOPHED) Adult infusion 17 mcg/min (12/08/18 1233)  . phenylephrine (NEO-SYNEPHRINE) Adult infusion 90 mcg/min (12/08/18 0705)  . vasopressin (PITRESSIN) infusion - *FOR SHOCK*      Assessment: 63 yoF admitted on 7/22 with PMH of afib and DVT on chronic Xarelto. She was transitioned to Heparin IV for possible chest tube (not needed), and resume Xarelto on 7/29.  She then had black stools on 7/30 and Xarelto was d/c (last dose on 7/29 at 1400). Heparin was started on 8/1, however it was placed on hold on 8/5 due to blood seen from pure wick with anemia and thrombocytopenia (in the setting of MRSA bacteremia.).  Pt has low 4T score (2-3) with time course not generally consistent with HIT. Low plts may be due to Sepsis induced DIC.   Heparin was held on 8/9 at 00:00 prior to the scheduled trach.  Pharmacy is now consulted to resume Heparin IV post trach - no procedural complications.  D-dimer has increased to >20 HL was previously subtherapeutic at 0.21 on heparin at 1100 units/hr.  Increased to 1250 units/hr but was d/c before level was rechecked.   CBC:  Hgb remains low/stable at 7.6.  Plt improved to 140 s/p Plt transfusion x2 units on 8/10. No bleeding or complications reported. SCr 0.77   Goal of Therapy:  Heparin level 0.3-0.5 units/ml Monitor platelets by anticoagulation protocol: Yes   Plan:  No heparin bolus d/t bleeding, anemia, thrombocytopenia,  recent trach. Resume heparin IV infusion at 1100 units/hr (low initial rate) Heparin level 6 hours after starting Daily heparin level and CBC Continue to monitor H&H and platelets   Gretta Arab PharmD, BCPS Clinical pharmacist phone 7am- 5pm: [phone] 12/08/2018 12:51 PM

## 2018-12-08 NOTE — Progress Notes (Signed)
NAME:  [name] [name], MRN:  [id number], DOB:  03/23/1951, LOS: 64 ADMISSION DATE:  11/07/2018, CONSULTATION DATE:  July 23 REFERRING MD:  Candiss Norse, CHIEF COMPLAINT:  dyspnea   Brief History   68 year old female admitted for severe acute respiratory failure with hypoxemia due to COVID-19 pneumonia  Past Medical History  Diabetes mellitus type 2 Obesity Asthma, has been on inhalers in the past, never smoked cigarettes CKD stage III Chronic A. fib History of spine surgery History of Graves' disease, now hypothyroid History of stroke History of DVT Lives in an assisted living facility [facility] Events   July 22 admitted July 23 intubated July 24 prone positioning July 26 subcutaneous air, no pneumothorax, afib with RVR again July 29 decreasing sedation, diuresis July 30 decreasing sedation July 31 holding additional diuresis 8/2 weaning on pressure support 8/5 vent settings down to FiO2 45% and PEEP 5  Consults:  PCCM  Procedures:  7/23 ETT > 7/23 L IJ CVL >8/3 8/3 midline/PIV>   Significant Diagnostic Tests:  7/24 Echo > LVEF > 65%, impaired relaxation, RV normal systolic function, mild thickening of aortic valve 8/4 repeat Echo> LVEF normal > 65%, no clear endocarditis  Micro Data:  July 22 SARS COV 2 Positive 8/2 blood > MRSA 8/4 blood > MRSA 8/6 blood > MRSA 8/9 blood >  Antimicrobials:  July 22 remdesivir> 7/26 July 22 decadron July 22> 23 Toclizumab   Interim history/subjective:   S/p trach placement yesterday. Tolerated platelet transfusion. Unable to obtain LP due to technical difficulty. This morning febrile and hypotensive. Given bolus and pressors changed to levophed  Objective   Blood pressure (!) 108/45, pulse 99, temperature 98.9 F (37.2 C), temperature source Oral, resp. rate (!) 29, height 5\' 3"  (1.6 m), weight 115.9 kg, SpO2 94 %.    Vent Mode: PCV FiO2 (%):  [50 %-80 %] 60 % Set Rate:  [24 bmp] 24 bmp PEEP:  [5  cmH20-8 cmH20] 8 cmH20 Plateau Pressure:  [14 cmH20-33 cmH20] 14 cmH20   Intake/Output Summary (Last 24 hours) at 12/08/2018 1805 Last data filed at 12/08/2018 1400 Gross per 24 hour  Intake 3206.74 ml  Output 930 ml  Net 2276.74 ml   Filed Weights   12/05/18 0500 12/06/18 0500 12/08/18 0500  Weight: 118.5 kg 117.2 kg 115.9 kg   Physical Exam: General: Obese, critically ill-appearing HENT: [place], AT, OP clear, MMM Neck: Trach in place c/d/i, sutures in place Eyes: EOMI, no scleral icterus Respiratory: Clear to auscultation bilaterally.  No crackles, wheezing or rales Cardiovascular: RRR, -M/R/G, no JVD GI: BS+, soft, nontender Extremities: Anasarca Neuro: Awake, alert, nods appropriately, moves bilateral extremities GU: Purewick in place  Resolved Hospital Problem list    Assessment & Plan:  Acute respiratory failure with hypoxemia: ARDS due to novel coronavirus infection Full vents support via ARDsnet protocol Titrate PEEP and FiO2 for goal SpO2 88-95% or pO2 55-80 Pulmonary hygiene including bronchodilator and flutter valve. AVOID manual percussion. Mobilize as tolerated 8/11: Wean vent settings. Hold on SBT due to worsening shock  Weakness: ICU-acquired weakness vs ?GBS  Appreciate Neurology input. Primary team unable to obtain LP sample due to technical difficulty per procedure. Suspicion of GBS low but cannot rule out. Will defer procedure for now in setting of acute illness. Will continue supportive care. PROM q2h  Septic shock - worsening. RCx 8/11 GNR MRSA bacteremia: Possible endocarditis. last + BCx on 8/6 F/u blood cultures Change Dapto to ceftaroline due to elevated CK  Appreciate ID input. Patient will need TEE after 4-6 weeks of empiric tx of endocarditis Transition from Neo to Levophed for MAP goal >65 Add vasopressin Continue midodrine 5 mg TID Hold home anti-hypertensive agents  Ventricular tachycardia as inpatient Atrial fib with RVR  Tele  PO amio  Continue metoprolol and digoxin Restart heparin gtt  Need for sedation for mechanical ventilation/dyssynchrony Fentanyl gtt for RASS goal 0 PRN oxycodone Wean benzo  ?enterovsiculofistula CT A/P negative  Pneumomediastinum: stable on 8/3 CXR, 8/7 interval development of SQ air - improved Continue to clinically monitor with prn CXR On minimal PEEP  Nausea [name] zofran  Best practice:  Diet: Tube feeding Pain/Anxiety/Delirium protocol (if indicated): as above VAP protocol (if indicated): yes DVT prophylaxis: heparin infusion GI prophylaxis: Pantoprazole for stress ulcer prophylaxis Glucose control: SSI Mobility: bed rest Code Status: limited code Family Communication: will discuss with TRH Disposition: remain in ICU  Labs   CBC: Recent Labs  Lab 12/04/18 0500 12/05/18 0500 12/06/18 0453 12/06/18 0517 12/07/18 0500 12/08/18 0506  WBC 3.5* 3.3* 2.2*  --  2.7* 1.9*  NEUTROABS 2.5 2.2 1.4*  --  1.8 1.3*  HGB 8.6* 8.4* 8.1* 8.5* 7.6* 7.6*  HCT 29.8* 28.7* 26.9* 25.0* 25.2* 26.1*  MCV 98.3 98.3 97.8  --  99.2 100.8*  PLT 56* 55* 57*  --  69* 140*    Basic Metabolic Panel: Recent Labs  Lab 12/04/18 0500 12/05/18 0500 12/06/18 0453 12/06/18 0517 12/07/18 0500 12/08/18 0506  NA 143 141 141 140 141 140  K 4.3 4.5 3.8 3.6 3.9 3.6  CL 102 103 100  --  101 102  CO2 33* 30 31  --  30 29  GLUCOSE 135* 114* 122*  --  61* 131*  BUN 47* 40* 36*  --  39* 30*  CREATININE 0.75 0.69 0.82  --  0.79 0.77  CALCIUM 7.5* 7.4* 7.5*  --  7.6* 7.5*  MG 2.1 2.0 1.8  --  2.1 1.9   GFR: Estimated Creatinine Clearance: 82.7 mL/min (by C-G formula based on SCr of 0.77 mg/dL). Recent Labs  Lab 12/05/18 0500 12/06/18 0453 12/07/18 0500 12/08/18 0506  WBC 3.3* 2.2* 2.7* 1.9*    Liver Function Tests: Recent Labs  Lab 12/03/18 0500 12/04/18 0500 12/05/18 0500 12/06/18 0453 12/08/18 0506  AST 64* 80* 113* 119* 106*  ALT 100* 99* 112* 109* 125*  ALKPHOS 58 63 66 64 92   BILITOT 1.1 1.4* 1.5* 1.6* 1.6*  PROT 4.7* 4.7* 4.6* 4.8* 5.7*  ALBUMIN 2.4* 2.2* 2.1* 2.4* 2.3*   No results for input(s): LIPASE, AMYLASE in the last 168 hours. No results for input(s): AMMONIA in the last 168 hours.  ABG    Component Value Date/Time   PHART 7.578 (H) 12/06/2018 0517   PCO2ART 35.6 12/06/2018 0517   PO2ART 59.0 (L) 12/06/2018 0517   HCO3 33.1 (H) 12/06/2018 0517   TCO2 34 (H) 12/06/2018 0517   ACIDBASEDEF 3.0 (H) 11/21/2018 0628   O2SAT 93.0 12/06/2018 0517     Coagulation Profile: Recent Labs  Lab 12/06/18 0635  INR 1.1    Cardiac Enzymes: Recent Labs  Lab 12/05/18 1400 12/07/18 0500 12/08/18 0506  CKTOTAL 2,215* 2,387* 2,461*    HbA1C: Hgb A1c MFr Bld  Date/Time Value Ref Range Status  11/21/2018 08:00 PM 7.0 (H) 4.8 - 5.6 % Final    Comment:    (NOTE) Pre diabetes:          5.7%-6.4% Diabetes:              >  6.4% Glycemic control for   <7.0% adults with diabetes   10/18/2016 10:47 AM 6.0 (H) 4.8 - 5.6 % Final    Comment:    (NOTE)         Pre-diabetes: 5.7 - 6.4         Diabetes: >6.4         Glycemic control for adults with diabetes: <7.0     CBG: Recent Labs  Lab 12/07/18 2356 12/08/18 0333 12/08/18 0758 12/08/18 1137 12/08/18 1544  GLUCAP 153* 142* 102* 136* 131*    Critical care time: 36 minutes    The patient is critically ill with multiple organ systems failure and requires high complexity decision making for assessment and support, frequent evaluation and titration of therapies, application of advanced monitoring technologies and extensive interpretation of multiple databases.   Discussed and co-managed patient care with PCCM-Hospitalist. Coordinated care with RT, RN and pharmacist.  Rodman Pickle, M.D. [facility] Pulmonary/Critical Care Medicine 12/08/2018 6:07 PM  Pager: [phone] After hours pager: [phone]

## 2018-12-08 NOTE — Progress Notes (Addendum)
OT Cancellation Note  Patient Details Name: [name] [name] MRN: [id number] DOB: 09-Apr-1951   Cancelled Treatment:    Reason Eval/Treat Not Completed: Patient not medically ready (RN states pt not ready for mobility due to BP. Will return as schedule allows and pt medically ready. Thank you)  [place], OTR/L Acute Rehab Pager: [phone] Office: [phone] 12/08/2018, 1:06 PM

## 2018-12-08 NOTE — Progress Notes (Signed)
2145- updated patient's emergency contact via voicemail

## 2018-12-08 NOTE — Progress Notes (Signed)
Upon arrival on shift, found patient midline displaced. Patient on levophed, vaso, fentanyl and heparin gtts with only two PIVs. Elink and IV therapy called. IV therapy unable to place PICC at this time. This RN communicated with IV therapy that patient has limited access and is on gtts requiring central access. IV therapy was able to place a midline tonight, and stated that they could come back and place a PICC tomorrow. This RN spoke with Dr. Emmit Alexanders from [place] [facility] and communicated access situation with him. Per Dr. Emmit Alexanders, ok to infuse vaso and levophed infusions through midline overnight.

## 2018-12-08 NOTE — TOC Progression Note (Signed)
Transition of Care [facility]) - Progression Note    Patient Details  Name: [name] [name] MRN: [id number] Date of Birth: [birth year]  Transition of Care [facility]) CM/SW Contact  Maryclare Labrador, RN Phone Number: 12/08/2018, 12:51 PM  Clinical Narrative:   PTA independent from home per daughter [name].  Pt received trach on 8/11 and s/p LP for eval for MG.    Pt remains on ventilator.  TOC will continue to follow    Barriers to Discharge: Other (comment)(COVID posittive - continued medical work up)  Expected Discharge Plan and Services         Living arrangements for the past 2 months: Single Family Home                                       Social Determinants of Health (SDOH) Interventions    Readmission Risk Interventions No flowsheet data found.

## 2018-12-08 NOTE — Progress Notes (Signed)
ANTICOAGULATION CONSULT NOTE - Follow Up Consult  Pharmacy Consult for Heparin IV Indication: atrial fibrillation  (Hx of DVT on PTA Xarelto)  Allergies  Allergen Reactions  . Sulfonamide Derivatives Itching    Patient Measurements: Height: 5\' 3"  (160 cm) Weight: 255 lb 8.2 oz (115.9 kg) IBW/kg (Calculated) : 52.4 Heparin Dosing Weight: 80.5 kg  Vital Signs: Temp: 100.4 F (38 C) (08/11 2000) Temp Source: Axillary (08/11 2000) BP: 115/54 (08/11 2215) Pulse Rate: 99 (08/11 2200)  Labs: Recent Labs    12/06/18 0453 12/06/18 0517 12/06/18 0635 12/07/18 0500 12/08/18 0506 12/08/18 0507 12/08/18 2210  HGB 8.1* 8.5*  --  7.6* 7.6*  --   --   HCT 26.9* 25.0*  --  25.2* 26.1*  --   --   PLT 57*  --   --  69* 140*  --   --   APTT  --   --  27  --   --   --   --   LABPROT  --   --  14.0  --   --   --   --   INR  --   --  1.1  --   --   --   --   HEPARINUNFRC  --   --   --  <0.10*  --  <0.10* 0.14*  CREATININE 0.82  --   --  0.79 0.77  --   --   CKTOTAL  --   --   --  2,387* 2,461*  --   --     Estimated Creatinine Clearance: 82.7 mL/min (by C-G formula based on SCr of 0.77 mg/dL).   Medications:  Scheduled:  . sodium chloride   Intravenous Once  . sodium chloride   Intravenous Once  . amiodarone  200 mg Per Tube BID  . chlorhexidine  15 mL Mouth/Throat BID  . digoxin  0.125 mg Intravenous Daily  . etomidate  40 mg Intravenous Once  . feeding supplement (PRO-STAT SUGAR FREE 64)  30 mL Per Tube QID  . feeding supplement (VITAL AF 1.2 CAL)  1,000 mL Per Tube Q24H  . ferrous sulfate  300 mg Per Tube TID WC  . gabapentin  100 mg Per Tube Q8H  . insulin aspart  0-9 Units Subcutaneous Q4H  . insulin detemir  40 Units Subcutaneous BID  . levothyroxine  112 mcg Per Tube Q0600  . mouth rinse  15 mL Mouth Rinse 10 times per day  . midodrine  5 mg Per Tube Q8H  . multivitamin with minerals  1 tablet Per Tube Daily  . pantoprazole sodium  40 mg Per Tube BID  . polyethylene  glycol  17 g Per Tube Daily  . propofol  500 mg Intravenous Once  . sodium chloride flush  10-40 mL Intracatheter Q12H  . vitamin C  500 mg Per Tube Daily  . zinc sulfate  220 mg Per Tube Daily   Infusions:  . sodium chloride 10 mL/hr at 12/08/18 2200  . albumin human 42 mL/hr at 12/08/18 2024  . ceFTAROline (TEFLARO) IV Stopped (12/08/18 1751)  . fentaNYL infusion INTRAVENOUS 150 mcg/hr (12/08/18 2200)  . heparin 1,100 Units/hr (12/08/18 2200)  . norepinephrine (LEVOPHED) Adult infusion 10 mcg/min (12/08/18 2200)  . phenylephrine (NEO-SYNEPHRINE) Adult infusion Stopped (12/08/18 0930)  . vasopressin (PITRESSIN) infusion - *FOR SHOCK* 0.03 Units/min (12/08/18 2200)    Assessment: 61 yoF admitted on 7/22 with PMH of afib and DVT on chronic Xarelto. She  was transitioned to Heparin IV for possible chest tube (not needed), and resume Xarelto on 7/29.  She then had black stools on 7/30 and Xarelto was d/c (last dose on 7/29 at 1400). Heparin was started on 8/1, however it was placed on hold on 8/5 due to blood seen from pure wick with anemia and thrombocytopenia (in the setting of MRSA bacteremia.).  Pt has low 4T score (2-3) with time course not generally consistent with HIT. Low plts may be due to Sepsis induced DIC.   Heparin was held on 8/9 at 00:00 prior to the scheduled trach.  Pharmacy is now consulted to resume Heparin IV post trach - no procedural complications.  D-dimer has increased to >20 HL was previously subtherapeutic at 0.21 on heparin at 1100 units/hr.  Increased to 1250 units/hr but was d/c before level was rechecked.   CBC:  Hgb remains low/stable at 7.6.  Plt improved to 140 s/p Plt transfusion x2 units on 8/10. No bleeding or complications reported. SCr 0.77  PM update: HL is 0.14, subtherapeutic No line or bleeding issues seen per RN    Goal of Therapy:  Heparin level 0.3-0.5 units/ml Monitor platelets by anticoagulation protocol: Yes   Plan:  No heparin bolus  d/t bleeding, anemia, thrombocytopenia, recent trach. Increase  heparin IV infusion to 1300 units/hr (low initial rate) Heparin level 6 hours after rate change  Daily heparin level and CBC Continue to monitor H&H and platelets    Royetta Asal, PharmD, BCPS 2018/12/28 1:05 AM

## 2018-12-08 NOTE — Progress Notes (Signed)
30- Daughter called and updated

## 2018-12-09 DIAGNOSIS — J150 Pneumonia due to Klebsiella pneumoniae: Secondary | ICD-10-CM

## 2018-12-09 LAB — CK: Total CK: 1376 U/L — ABNORMAL HIGH (ref 38–234)

## 2018-12-09 LAB — COMPREHENSIVE METABOLIC PANEL
ALT: 111 U/L — ABNORMAL HIGH (ref 0–44)
AST: 82 U/L — ABNORMAL HIGH (ref 15–41)
Albumin: 2.9 g/dL — ABNORMAL LOW (ref 3.5–5.0)
Alkaline Phosphatase: 97 U/L (ref 38–126)
Anion gap: 13 (ref 5–15)
BUN: 37 mg/dL — ABNORMAL HIGH (ref 8–23)
CO2: 23 mmol/L (ref 22–32)
Calcium: 7.5 mg/dL — ABNORMAL LOW (ref 8.9–10.3)
Chloride: 106 mmol/L (ref 98–111)
Creatinine, Ser: 0.92 mg/dL (ref 0.44–1.00)
GFR calc Af Amer: >60 mL/min (ref >60)
GFR calc non Af Amer: >60 mL/min (ref >60)
Glucose, Bld: 193 mg/dL — ABNORMAL HIGH (ref 70–99)
Potassium: 4.2 mmol/L (ref 3.5–5.1)
Sodium: 142 mmol/L (ref 135–145)
Total Bilirubin: 1.9 mg/dL — ABNORMAL HIGH (ref 0.3–1.2)
Total Protein: 6 g/dL — ABNORMAL LOW (ref 6.5–8.1)

## 2018-12-09 LAB — CBC WITH DIFFERENTIAL/PLATELET
Abs Immature Granulocytes: 0.06 10*3/uL (ref 0.00–0.07)
Basophils Absolute: 0 10*3/uL (ref 0.0–0.1)
Basophils Relative: 1 %
Eosinophils Absolute: 0.1 10*3/uL (ref 0.0–0.5)
Eosinophils Relative: 3 %
HCT: 23.9 % — ABNORMAL LOW (ref 36.0–46.0)
Hemoglobin: 6.9 g/dL — CL (ref 12.0–15.0)
Immature Granulocytes: 2 %
Lymphocytes Relative: 19 %
Lymphs Abs: 0.7 10*3/uL (ref 0.7–4.0)
MCH: 30.3 pg (ref 26.0–34.0)
MCHC: 28.9 g/dL — ABNORMAL LOW (ref 30.0–36.0)
MCV: 104.8 fL — ABNORMAL HIGH (ref 80.0–100.0)
Monocytes Absolute: 0.1 10*3/uL (ref 0.1–1.0)
Monocytes Relative: 3 %
Neutro Abs: 2.8 10*3/uL (ref 1.7–7.7)
Neutrophils Relative %: 72 %
Platelets: 65 10*3/uL — ABNORMAL LOW (ref 150–400)
RBC: 2.28 MIL/uL — ABNORMAL LOW (ref 3.87–5.11)
RDW: 26.2 % — ABNORMAL HIGH (ref 11.5–15.5)
WBC: 3.9 10*3/uL — ABNORMAL LOW (ref 4.0–10.5)
nRBC: 24.8 % — ABNORMAL HIGH (ref 0.0–0.2)

## 2018-12-09 LAB — GLUCOSE, CAPILLARY
Glucose-Capillary: 158 mg/dL — ABNORMAL HIGH (ref 70–99)
Glucose-Capillary: 204 mg/dL — ABNORMAL HIGH (ref 70–99)
Glucose-Capillary: 186 mg/dL — ABNORMAL HIGH (ref 70–99)

## 2018-12-09 LAB — MAGNESIUM: Magnesium: 1.9 mg/dL (ref 1.7–2.4)

## 2018-12-09 LAB — CULTURE, BLOOD (ROUTINE X 2): Special Requests: ADEQUATE

## 2018-12-09 LAB — C-REACTIVE PROTEIN: CRP: 13.2 mg/dL — ABNORMAL HIGH (ref <1.0)

## 2018-12-09 LAB — HEPARIN LEVEL (UNFRACTIONATED): Heparin Unfractionated: 0.23 IU/mL — ABNORMAL LOW (ref 0.30–0.70)

## 2018-12-09 LAB — DIGOXIN LEVEL: Digoxin Level: 0.9 ng/mL (ref 0.8–2.0)

## 2018-12-09 LAB — PREPARE RBC (CROSSMATCH)

## 2018-12-09 LAB — D-DIMER, QUANTITATIVE: D-Dimer, Quant: >20 ug/mL-FEU — ABNORMAL HIGH (ref 0.00–0.50)

## 2018-12-09 MED ORDER — VITAL HIGH PROTEIN PO LIQD: 1000.0000 mL | ORAL | Status: DC

## 2018-12-09 MED ORDER — SODIUM CHLORIDE 0.9% IV SOLUTION
Freq: Once | INTRAVENOUS | Status: AC
  Administered 2018-12-09: 09:00:00 via INTRAVENOUS

## 2018-12-09 MED ORDER — ACETAMINOPHEN 160 MG/5ML PO SOLN: 650.0000 mg | Freq: Four times a day (QID) | ORAL | Status: DC | PRN

## 2018-12-09 MED ORDER — HEPARIN (PORCINE) 25000 UT/250ML-% IV SOLN
1400.0000 [IU]/h | INTRAVENOUS | Status: DC
  Administered 2018-12-09: 1400 [IU]/h via INTRAVENOUS
  Filled 2018-12-09: qty 250

## 2018-12-09 MED ORDER — MIDODRINE HCL 5 MG PO TABS
10.0000 mg | ORAL_TABLET | Freq: Three times a day (TID) | ORAL | Status: DC
  Filled 2018-12-09 (×3): qty 2

## 2018-12-09 MED ORDER — ATROPINE SULFATE 1 MG/10ML IJ SOSY
PREFILLED_SYRINGE | INTRAMUSCULAR | Status: AC
  Filled 2018-12-09: qty 10

## 2018-12-09 MED ORDER — POLYETHYLENE GLYCOL 3350 17 G PO PACK: 17.0000 g | PACK | Freq: Every day | ORAL | Status: DC | PRN

## 2018-12-09 MED ORDER — VITAL AF 1.2 CAL PO LIQD: 1000.0000 mL | ORAL | Status: DC

## 2018-12-10 LAB — URINE CULTURE: Culture: 60000 — AB

## 2018-12-10 LAB — CULTURE, RESPIRATORY W GRAM STAIN

## 2018-12-11 LAB — CULTURE, BLOOD (ROUTINE X 2)
Culture: NO GROWTH
Culture: NO GROWTH
Special Requests: ADEQUATE
Special Requests: ADEQUATE

## 2018-12-12 LAB — CULTURE, BLOOD (ROUTINE X 2)
Culture: NO GROWTH
Culture: NO GROWTH
Special Requests: ADEQUATE
Special Requests: ADEQUATE

## 2018-12-12 LAB — TYPE AND SCREEN
ABO/RH(D): A POS
Antibody Screen: NEGATIVE
Unit division: 0

## 2018-12-12 LAB — BPAM RBC
Blood Product Expiration Date: 202009042359
ISSUE DATE / TIME: 202008121332
Unit Type and Rh: 6200

## 2018-12-12 LAB — CULTURE, BODY FLUID W GRAM STAIN -BOTTLE: Culture: NO GROWTH

## 2018-12-13 LAB — CULTURE, BLOOD (SINGLE)
Culture: NO GROWTH
Special Requests: ADEQUATE

## 2018-12-29 NOTE — Progress Notes (Signed)
At 0520 patient became tachypneic and hypoxic down to 72%. This RN bolus with fentanyl from the pump and gave 2 mg versed IV. Patient fio2 titrated back up from 60% to 100%. RT at bedside.

## 2018-12-29 NOTE — Progress Notes (Signed)
Patients family member came to [facility] and I gave him her belongings.

## 2018-12-29 NOTE — Progress Notes (Signed)
Upon entering patient room provider, RN's and RT at bedside, patient had a bradycardic epispode, atropine given,  No heart beat per telementry monitor and ascultation of Ryan Rn and myself.

## 2018-12-29 NOTE — Progress Notes (Signed)
Spoke with Daughter and answered all questions. Assisted Laverne, daughter in law, to facetime with patient. No further questions at this time.

## 2018-12-29 NOTE — Progress Notes (Signed)
OT Cancellation Note  Patient Details Name: [name] [name] MRN: [id number] DOB: 01-Sep-1950   Cancelled Treatment:    Reason Eval/Treat Not Completed: Patient not medically ready(Pt with recent events of hypoxic and tachypneic. Will return as schedule allows and pt medically ready. Thank you.)  [place], OTR/L Acute Rehab Pager: [phone] Office: [phone] 13-Dec-2018, 7:43 AM

## 2018-12-29 NOTE — Progress Notes (Signed)
CRITICAL VALUE ALERT  Critical Value:  Hgb 6.9  Date & Time Notied:  0539 14-Dec-2018  Provider Notified: Josephine Cables  Orders Received/Actions taken: awaiting orders

## 2018-12-29 NOTE — Progress Notes (Signed)
NAME:  [name] [name], MRN:  [id number], DOB:  11-27-1950, LOS: 50 ADMISSION DATE:  10/28/2018, CONSULTATION DATE:  July 23 REFERRING MD:  Candiss Norse, CHIEF COMPLAINT:  dyspnea   Brief History   68 year old female admitted for severe acute respiratory failure with hypoxemia due to COVID-19 pneumonia  Past Medical History  Diabetes mellitus type 2 Obesity Asthma, has been on inhalers in the past, never smoked cigarettes CKD stage III Chronic A. fib History of spine surgery History of Graves' disease, now hypothyroid History of stroke History of DVT Lives in an assisted living facility [facility] Events   July 22 admitted July 23 intubated July 24 prone positioning July 26 subcutaneous air, no pneumothorax, afib with RVR again July 29 decreasing sedation, diuresis July 30 decreasing sedation July 31 holding additional diuresis 8/2 weaning on pressure support 8/5 vent settings down to FiO2 45% and PEEP 5  Consults:  PCCM  Procedures:  7/23 ETT > 7/23 L IJ CVL >8/3 8/3 midline/PIV>   Significant Diagnostic Tests:  7/24 Echo > LVEF > 65%, impaired relaxation, RV normal systolic function, mild thickening of aortic valve 8/4 repeat Echo> LVEF normal > 65%, no clear endocarditis  Micro Data:  July 22 SARS COV 2 Positive 8/2 blood > MRSA 8/4 blood > MRSA 8/6 blood > MRSA 8/9 blood >  Antimicrobials:  July 22 remdesivir> 7/26 July 22 decadron July 22> 23 Toclizumab   Interim history/subjective:   Overnight, increased O2 requirement. Levophed weaned after initiation of vasopressin. This afternoon, patient's heart rate abruptly went bradycardic. Event witnessed at bedside by myself and RT. Patient found pulseless. Atropine given. DNR status. Patient expired.  Objective   Blood pressure (!) 93/57, pulse 96, temperature 99.8 F (37.7 C), temperature source Axillary, resp. rate (!) 39, height 5\' 3"  (1.6 m), weight 115.9 kg, SpO2 (!) 85 %.    Vent Mode:  PCV FiO2 (%):  [60 %-100 %] 100 % Set Rate:  [24 bmp] 24 bmp PEEP:  [8 cmH20-14 cmH20] 14 cmH20 Plateau Pressure:  [20 cmH20-30 cmH20] 30 cmH20   Intake/Output Summary (Last 24 hours) at 12-22-18 1842 Last data filed at Dec 22, 2018 0900 Gross per 24 hour  Intake 1927.45 ml  Output 650 ml  Net 1277.45 ml   Filed Weights   12/05/18 0500 12/06/18 0500 12/08/18 0500  Weight: 118.5 kg 117.2 kg 115.9 kg   Physical Exam: General: Obese, critically ill-appearing HENT: [place], AT, OP clear, MMM Neck: Trach in place, c/d/i Eyes: EOMI, no scleral icterus Respiratory: Coarse breath sounds bilaterally Extremities:-Edema,-tenderness Neuro: Unresponsive GU: Purewick in place  Resolved Hospital Problem list    Assessment & Plan:   Acute respiratory failure with hypoxemia: COVID-19 PNA c/b co-comitant bacterial pnemonia Septic shock secondary Klebsiella Pneumoniae MRSA bacteremia: Possible endocarditis. last + BCx on 8/6 Ventricular tachycardia as inpatient Atrial fib with RVR   Acutely managed patient at bedside as noted above. Patient expired at 1410. Primary team communicated with family.   Labs   CBC: Recent Labs  Lab 12/05/18 0500 12/06/18 0453 12/06/18 0517 12/07/18 0500 12/08/18 0506 12/22/18 0500  WBC 3.3* 2.2*  --  2.7* 1.9* 3.9*  NEUTROABS 2.2 1.4*  --  1.8 1.3* 2.8  HGB 8.4* 8.1* 8.5* 7.6* 7.6* 6.9*  HCT 28.7* 26.9* 25.0* 25.2* 26.1* 23.9*  MCV 98.3 97.8  --  99.2 100.8* 104.8*  PLT 55* 57*  --  69* 140* 65*    Basic Metabolic Panel: Recent Labs  Lab 12/05/18 0500  12/06/18 0453 12/06/18 0517 12/07/18 0500 12/08/18 0506 12/30/18 0500  NA 141 141 140 141 140 142  K 4.5 3.8 3.6 3.9 3.6 4.2  CL 103 100  --  101 102 106  CO2 30 31  --  30 29 23   GLUCOSE 114* 122*  --  61* 131* 193*  BUN 40* 36*  --  39* 30* 37*  CREATININE 0.69 0.82  --  0.79 0.77 0.92  CALCIUM 7.4* 7.5*  --  7.6* 7.5* 7.5*  MG 2.0 1.8  --  2.1 1.9 1.9   GFR: Estimated Creatinine  Clearance: 71.9 mL/min (by C-G formula based on SCr of 0.92 mg/dL). Recent Labs  Lab 12/06/18 0453 12/07/18 0500 12/08/18 0506 2018-12-30 0500  WBC 2.2* 2.7* 1.9* 3.9*    Liver Function Tests: Recent Labs  Lab 12/04/18 0500 12/05/18 0500 12/06/18 0453 12/08/18 0506 December 30, 2018 0500  AST 80* 113* 119* 106* 82*  ALT 99* 112* 109* 125* 111*  ALKPHOS 63 66 64 92 97  BILITOT 1.4* 1.5* 1.6* 1.6* 1.9*  PROT 4.7* 4.6* 4.8* 5.7* 6.0*  ALBUMIN 2.2* 2.1* 2.4* 2.3* 2.9*   No results for input(s): LIPASE, AMYLASE in the last 168 hours. No results for input(s): AMMONIA in the last 168 hours.  ABG    Component Value Date/Time   PHART 7.578 (H) 12/06/2018 0517   PCO2ART 35.6 12/06/2018 0517   PO2ART 59.0 (L) 12/06/2018 0517   HCO3 33.1 (H) 12/06/2018 0517   TCO2 34 (H) 12/06/2018 0517   ACIDBASEDEF 3.0 (H) 11/21/2018 0628   O2SAT 93.0 12/06/2018 0517     Coagulation Profile: Recent Labs  Lab 12/06/18 0635  INR 1.1    Cardiac Enzymes: Recent Labs  Lab 12/05/18 1400 12/07/18 0500 12/08/18 0506 2018/12/30 0500  CKTOTAL 2,215* 2,387* 2,461* 1,376*    HbA1C: Hgb A1c MFr Bld  Date/Time Value Ref Range Status  11/04/2018 08:00 PM 7.0 (H) 4.8 - 5.6 % Final    Comment:    (NOTE) Pre diabetes:          5.7%-6.4% Diabetes:              >6.4% Glycemic control for   <7.0% adults with diabetes   10/18/2016 10:47 AM 6.0 (H) 4.8 - 5.6 % Final    Comment:    (NOTE)         Pre-diabetes: 5.7 - 6.4         Diabetes: >6.4         Glycemic control for adults with diabetes: <7.0     CBG: Recent Labs  Lab 12/08/18 1945 12/08/18 2343 30-Dec-2018 0342 12/30/18 0820 Dec 30, 2018 1136  GLUCAP 131* 158* 158* 204* 186*    Critical care time: 45 minutes    The patient is critically ill with multiple organ systems failure and requires high complexity decision making for assessment and support, frequent evaluation and titration of therapies, application of advanced monitoring  technologies and extensive interpretation of multiple databases.   Discussed and co-managed patient care with PCCM-Hospitalist. Coordinated care with RT, RN and pharmacist.  Rodman Pickle, M.D. [facility] Pulmonary/Critical Care Medicine 12/30/2018 6:42 PM  Pager: [phone] After hours pager: [phone]

## 2018-12-29 NOTE — Discharge Summary (Signed)
[facility] Death Summary   Patient: [name] [name] OJJ:[id number]   PCP: Nicholos Johns, MD DOB: 1951-02-11   Date of admission: December 10, 2018   Date and time of death: 12/31/2018 @ 14:06   Hospital Diagnoses:  Cause of death Acute covid 19 infection with respiratory failure.  Active Problems:   Essential hypertension   Acute respiratory failure with hypoxemia (HCC)   PAF (paroxysmal atrial fibrillation) (HCC)   Pneumonia due to COVID-19 virus   SOB (shortness of breath)   Ventricular tachyarrhythmia (HCC)   Intubation of airway performed without difficulty   Subcutaneous emphysema (HCC)   Pneumomediastinum (HCC)   Pressure injury of skin   MRSA bacteremia   Atrial fibrillation, chronic   Anasarca   Acute renal failure superimposed on stage 3 chronic kidney disease (HCC)   Controlled type 2 diabetes mellitus with hyperglycemia (HCC)   Acute blood loss anemia   Thrombocytopenia (HCC)   Hemiparesis (HCC)   Elevated CK   Diabetes mellitus type 2, controlled, with complications ([place])  History of present illness: As per the H and P dictated on admission, "[name] [name]  is a 68 y.o. female, with history of morbid obesity, DM type II, COPD not on oxygen, hypertension, CKD 3 baseline creatinine around 1.4, chronic atrial fibrillation [place] vas 2 score of at least 3 on Xarelto and Cardizem, spine surgeries in the past, chronic fluid retention, history of Graves' disease now hypothyroid, history of CVA, history of DVT who lives at [facility] and was likely exposed to COVID-19 infection about 10 to 14 days ago.    She was having low-grade fevers body aches and fatigue for several days and started developing some shortness of breath around 4 to 5 days ago, yesterday she presented herself to [facility] ER for shortness of breath and was diagnosed with COVID-19 pneumonitis causing acute hypoxic respiratory failure, ARF, elevated CRP and ferritin.  She was then transferred here for further  care, came about 20 hours later due to lack of transportation.  Patient currently has shortness of breath at rest, requiring about 15 L nasal cannula oxygen, some fatigue, no other subjective complaints at this time."  Hospital Course:  Acute respiratory failure with hypoxia/COVID 19 pneumonia/ARDS -Completed course Remdesivir -Dexamethasone started 12/10/22 6 mg daily tapered and stopped at 14 days. -Actemra x2 doses 2022-12-10 and 7/23 -vitamins per COVID protocol 8/6 PCXR shows worsening pneumonia see results below -Fentanyl as needed,e Vrsed PRN, oxycodone IR as needed -8/8 wean patient off of clonazepam  Last Labs          Recent Labs  Lab 12/04/18 0500 12/05/18 0500 12/06/18 0453 12/07/18 0500 12/08/18 0507  CRP 1.7* 6.1* 5.3* 9.0* 9.6*     Last Labs          Recent Labs  Lab 12/04/18 0500 12/05/18 0500 12/06/18 0635 12/07/18 0500 12/08/18 0506  DDIMER 17.45* 18.07* 12.97* >20.00* >20.00*    -8/11 s/p tracheostomy and LP -8/11 PCXR continued bilateral opacities see results below  Asthma vs COPD - See acute respiratory failure  Subcutaneous emphysema/pneumomediastinum - 7/26 diagnosis subcutaneous emphysema.  CXR also suggested mediastinal emphysema.  CXR findings have been stable - Conservative management no indication for chest tube -8/6 PCXR: See acute respiratory failure -8/10 PCXR: mild interval progression of bilateral airspace opacities.  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  Stable   Bilateral hemiparalysis - Patient unable to move bilateral  upper extremities or bilateral lower extremities.  States she can feel when she is being touched on her extremities but does not register pain. - Neurology; recommends LP, his DDX critical care myopathy vs neuropathy, vs Gilliam Barr syndrome vs infection, unable to complete due to severe thrombocytopenia.     MRSA bacteremia -Central/PIC line removed -Patient placed on appropriate antibiotics -Patient will receive 24 to 48-hour line holiday - Blood culture NGTD see below - 7/24 echocardiogram negative vegetation.  However patient diagnosed with MRSA bacteremia on 8/2 repeat echocardiogram most likely will be required.  Would prefer TEE.  Will need to discuss with cardiology - 8/2 repeat blood cultures positive staph aureus speciation and susceptibilities pending. -8/7 discussed case with cardiology recommend empiric coverage for possible endocarditis.  unable perform TEE at this time as patient has COVID and procedure is an aerosol lysing procedure. -8/7 discussed case with Dr. Carlyle Basques, ID concerning restarting antibiotics.  Does not believe patient is a vancomycin failure therefore recommends restarting vancomycin for minimum of 4 weeks treatment. -8/8 Dr. Carlyle Basques ID has changed vancomycin---> daptomycin for a 4 to 6-week course of antibiotics. - 8/10 despite elevation in CK ID recommends continuation of daptomycin Later on changed to ceftarolin  Elevated CK -Monitor closely Last Labs        Recent Labs  Lab 12/05/18 1400 12/07/18 0500 12/08/18 0506  CKTOTAL 2,215* 2,387* 2,461*     Enterovesical Fistula ? -Some concern that patient may have a fistula, but I am not fully convinced patient has such fistula.  Usually patient would have a significant leukocytosis, fever etc.  But given that she has been on immunosuppressive medication we will proceed as if fistula possible. - Obtain urinalysis - Obtain urine culture - 8/7 discussed case with radiology they recommended CT abdomen/pelvis with oral and IV contrast. -Dependent upon findings consult GI or GYN.  -8/8 CT abdomen with: Inconclusive, see results below will consult GI on 8/9 discussed case going forward. -8/10 will address once patient more stable in regards to placement of tracheostomy, LP, neurological  deficits identified and corrected.  Chronic A. fib with episodes of V. tach - At home was treated with Xarelto -Amiodarone drip -Digoxin 0.125 mg daily -Metoprolol 2.5 mg QID -Metoprolol PRN - Patient also thrombocytopenic if she continues to bleed will transfuse unit of platelets.  -Platelets low normal prior to admission -CHADS2VASc score~3 -8/6 bleeding has stabilized continue to hold all anticoagulants for another 24 hours will then make decision on whether to restart. -8/11 restart heparin -Daily weight      Filed Weights   12/05/18 0500 12/06/18 0500 12/08/18 0500  Weight: 118.5 kg 117.2 kg 115.9 kg   Anasarca  -See chronic A. fib  Hypotension - Midodrine 5 mg 3 times daily  Hypokalemia -Potassium goal> 4  Hypomagnesmia -Magnesium goal> 2  Acute on CKD stage III ?  (Cr 12/25/2016 0.90) -Unknown baseline renal function -Baseline renal function not entirely known.  Last Labs   Recent Labs  Lab 12/03/18 0500 12/04/18 0500 12/05/18 0500 12/06/18 0453 12/07/18 0500  CREATININE 0.85 0.75 0.69 0.82 0.79    -Improved  Hypothyroidism Given levothyroxine.  As noted above her TSH was noted to be low. Free T4 is 1.0. No further work-up. Low TSH likely sick euthyroid.  Hx stroke  -Patient was on Xarelto and aspirin, have been switched over to IV heparin however given her ongoing bleeding. All anticoagulants on hold.   History of vitamin D deficiency Continue supplementation.  Hx DVT -Xarelto on hold, on and off of anticoagulation due to concern for bleeding, resumed.   Diabetes type 2 controlled with hyperglycemia -7/22 hemoglobin A1c = 7.0  -  Levemir 40 units BID - 8/10 decrease to sensitive SSI   Morbid obesity Body mass index is 45.26 kg/m.   Nutrition tube feedings.  Constipation resolved bowel regimen.  Mildly elevated AST.  Acute blood loss anemia/Bleeding from oral cavity/melena -Patient had been on Xarelto and  heparin -Hemoglobin on admission was 12.6. Last Labs [] Expand by Default         Recent Labs  Lab 12/05/18 0500 12/06/18 0453 12/06/18 0517 12/07/18 0500 12/08/18 0506  HGB 8.4* 8.1* 8.5* 7.6* 7.6*      Acute thrombocytopenia -Most likely secondary to MRSA bacteremia -HIT panel negative  -8/10 transfused 2 units of platelets  Leukocytosis - Most likely secondary to infection - We will type and cross  The patient was pronounced deceased at 14:06, on 12-17-18.  Procedures and Results: 7/23 intubation >Central line insertion 11/19/2018 >Cortrack replacement 11/25/2018 >Transthoracic echocardiogram 7/24 1. The left ventricle has hyperdynamic systolic function, with an ejection fraction of >65%. The cavity size was normal. Left ventricular diastolic Doppler parameters are consistent with impaired relaxation. 2. The right ventricle has normal systolc function. The cavity was normal. There is no increase in right ventricular wall thickness. Right ventricular systolic pressure is normal. 3. Mild thickening of the aortic valve. No stenosis of the aortic valve. 4. The aortic root and ascending aorta are normal in size and structure. >Limited echocardiogram 12/01/2018 no vegetation. 8/6 PCXR: New LEFT supra clavicular soft tissue emphysema, no visible pneumothorax - Worsening pneumonia, chronically elevated RIGHT diaphragm 8/7 CT abdomen pelvis W contrast:Nonspecific thickening of the urinary bladder.-no evidence of contrast fistulation from the urinary bladder to the bowel on delayed urographic contrast phase, however this does not exclude the presence of enterovesical fistula. There is no air within the urinary bladder, which is highly characteristic of fistula. More definitive evaluation will require fluoroscopic cystogram and/or contrast enema when clinically appropriate. -colon is fluid-filled to the rectum, in keeping with diarrheal illness, and a rectal tube is in  place. -extensive, dense consolidation and ground-glass opacity in the included bilateral lung bases. 8/10 PCXR- mild interval progression of bilateral airspace opacities - Stable support apparatus 8/11 PCXR:- Stable ELEVATED RIGHT hemidiaphragm.  - Stable bilateral lung opacities RIGHT>> LEFT   Consultations:  PCCM   Neurology  Infectious disease  The results of significant diagnostics from this hospitalization (including imaging, microbiology, ancillary and laboratory) are listed below for reference.    Significant Diagnostic Studies: Dg Abd 1 View  Result Date: 11/19/2018 CLINICAL DATA:  Check gastric catheter EXAM: ABDOMEN - 1 VIEW COMPARISON:  None. FINDINGS: Gastric catheter is noted within the distal aspect of the stomach. Scattered large and small bowel gas is noted. No other focal abnormality is seen. IMPRESSION: Gastric catheter within the distal stomach. Electronically Signed   By: Inez Catalina M.D.   On: 11/19/2018 19:44   Ct Abdomen Pelvis W Contrast  Result Date: 12/04/2018 CLINICAL DATA:  Enterovesical fistula EXAM: CT ABDOMEN AND PELVIS WITH CONTRAST TECHNIQUE: Multidetector CT imaging of the abdomen and pelvis was performed using the standard protocol following bolus administration of intravenous contrast. CONTRAST:  195mL OMNIPAQUE IOHEXOL 300 MG/ML  SOLN COMPARISON:  None. FINDINGS: Lower chest: There is extensive, dense consolidation and ground-glass opacity in the included bilateral lung bases. Hepatobiliary: No solid liver abnormality is seen. No gallstones,  gallbladder wall thickening, or biliary dilatation. Pancreas: Unremarkable. No pancreatic ductal dilatation or surrounding inflammatory changes. Spleen: Normal in size without significant abnormality. Adrenals/Urinary Tract: Adrenal glands are unremarkable. Kidneys are normal, without renal calculi, solid lesion, or hydronephrosis. Thickening of the urinary bladder. Stomach/Bowel: Stomach is within normal limits.  Weighted enteric feeding tube is positioned with tip in the transverse portion of the duodenum. Appendix appears normal. No evidence of bowel wall thickening, distention, or inflammatory changes. The colon is fluid-filled to the rectum and a rectal tube is in place. Vascular/Lymphatic: Aortic atherosclerosis. No enlarged abdominal or pelvic lymph nodes. Reproductive: No mass or other significant abnormality. Other: No abdominal wall hernia or abnormality. Trace ascites in the abdomen and pelvis. Musculoskeletal: No acute or significant osseous findings. IMPRESSION: 1. Nonspecific thickening of the urinary bladder. There is no evidence of contrast fistulation from the urinary bladder to the bowel on delayed urographic contrast phase, however this does not exclude the presence of enterovesical fistula. There is no air within the urinary bladder, which is highly characteristic of fistula. More definitive evaluation will require fluoroscopic cystogram and/or contrast enema when clinically appropriate. 2. The colon is fluid-filled to the rectum, in keeping with diarrheal illness, and a rectal tube is in place. 3.  Trace nonspecific ascites in the abdomen and pelvis. 4. There is extensive, dense consolidation and ground-glass opacity in the included bilateral lung bases. Electronically Signed   By: Eddie Candle M.D.   On: 12/04/2018 16:32   Dg Chest Port 1 View  Result Date: 12/08/2018 CLINICAL DATA:  Hypoxemia. EXAM: PORTABLE CHEST 1 VIEW COMPARISON:  Radiograph of December 07, 2018. FINDINGS: Stable cardiomegaly. Atherosclerosis of thoracic aorta is noted. Tracheostomy and feeding tubes are in good position. Stable elevated right hemidiaphragm is noted. Stable bilateral lung opacities are noted, right greater than left, concerning for pneumonia or possibly atelectasis. No pneumothorax is noted. Bony thorax is unremarkable. IMPRESSION: Stable support apparatus. Stable bilateral lung lung opacities as described above.  Aortic Atherosclerosis (ICD10-I70.0). Electronically Signed   By: Marijo Conception M.D.   On: 12/08/2018 10:22   Dg Chest Port 1 View  Result Date: 12/07/2018 CLINICAL DATA:  Tracheostomy tube EXAM: PORTABLE CHEST 1 VIEW COMPARISON:  December 07, 2018 FINDINGS: There is interval placement of a tracheostomy tube with the tip seen at just at the level of the superior clavicular heads. Enteric tube is seen below the level of the diaphragm. Overall shallow degree of aeration with patchy airspace opacities bilaterally. There is bronchial wall thickening seen within the perihilar regions. No change in cardiomediastinal silhouette. No acute osseous abnormality. IMPRESSION: Interval placement of tracheostomy tube with the tip seen at the superior clavicular heads. Unchanged bilateral airspace opacities. Electronically Signed   By: Prudencio Pair M.D.   On: 12/07/2018 16:24   Dg Chest Port 1 View  Result Date: 12/07/2018 CLINICAL DATA:  Subcutaneous emphysema EXAM: PORTABLE CHEST 1 VIEW COMPARISON:  12/05/2018 FINDINGS: Endotracheal tube terminates approximately 2.0 cm superior to the carina. Enteric tube courses below the diaphragm, distal tip beyond the inferior margin of the film. Multiple overlying leads. Cardiomediastinal silhouette is largely obscured. Lung volumes are low. Extensive bilateral airspace opacities predominantly within the lung bases, slightly progressed compared to prior study. No pneumothorax is identified. IMPRESSION: 1. Mild interval progression of bilateral airspace opacities. 2. Stable support apparatus. Electronically Signed   By: Davina Poke M.D.   On: 12/07/2018 10:27   Dg Chest Port 1 View  Result Date: 12/05/2018 CLINICAL  DATA:  Acute respiratory failure EXAM: PORTABLE CHEST 1 VIEW COMPARISON:  12/03/2018 chest radiograph. FINDINGS: Endotracheal tube tip is 3.3 cm above the carina. Enteric tube enters stomach with the tip not seen on this image. Stable cardiomediastinal silhouette  with normal heart size. No pneumothorax. No pleural effusion. Extensive patchy consolidation in both lungs, most prominent in the lower lungs, minimally improved on the left. IMPRESSION: 1. Well-positioned support structures. 2. Extensive patchy bilateral lung consolidation, most prominent in the lower lungs, minimally improved on the left, compatible with multilobar pneumonia. Electronically Signed   By: Ilona Sorrel M.D.   On: 12/05/2018 09:26   Dg Chest Port 1 View  Result Date: 12/03/2018 CLINICAL DATA:  Pneumomediastinum. EXAM: PORTABLE CHEST 1 VIEW COMPARISON:  Three days ago FINDINGS: Very low volume chest with asymmetric right diaphragm elevation. Worsening airspace disease. Normal heart size. Endotracheal tube tip at the clavicular heads. The feeding tube at least reaches the stomach. Left IJ catheter has been removed. Soft tissue emphysema is newly seen in the left supraclavicular region. No visible pneumothorax. No visible pneumomediastinum. IMPRESSION: 1. New left supraclavicular soft tissue emphysema. No visible pneumothorax. 2. A left IJ catheter has been removed since prior. Remaining hardware in unremarkable position. 3. Worsening pneumonia.  Chronically elevated right diaphragm. Electronically Signed   By: Monte Fantasia M.D.   On: 12/03/2018 08:54   Dg Chest Port 1 View  Result Date: 11/30/2018 CLINICAL DATA:  Respiratory difficulty EXAM: PORTABLE CHEST 1 VIEW COMPARISON:  11/29/2018 FINDINGS: Cardiac shadow is stable. Endotracheal tube, left jugular central line and feeding catheter are again seen and stable. Elevation of the right hemidiaphragm is again seen. Patchy parenchymal opacities are noted bilaterally. These are stable in appearance from the prior exam. No bony abnormality is seen. IMPRESSION: Stable appearance when compared with the previous day. Electronically Signed   By: Inez Catalina M.D.   On: 11/30/2018 03:56   Dg Chest Port 1 View  Result Date: 11/29/2018 CLINICAL DATA:   Acute respiratory failure. EXAM: PORTABLE CHEST 1 VIEW COMPARISON:  Chest radiograph 11/27/2018 FINDINGS: ET tube terminates in the mid trachea. Central venous catheter tip projects over the central left brachiocephalic vein. Enteric tube courses inferior to the diaphragm. Monitoring leads overlie the patient. Stable cardiac and mediastinal contours. Elevation right hemidiaphragm. Interval increase in left mid and lower lung and right lung base opacities. No pleural effusion or pneumothorax. IMPRESSION: Interval increase left mid and lower lung and right lung base opacities. Electronically Signed   By: Lovey Newcomer M.D.   On: 11/29/2018 11:35   Dg Chest Port 1 View  Result Date: 11/27/2018 CLINICAL DATA:  68 year old female with a history respiratory failure EXAM: PORTABLE CHEST 1 VIEW COMPARISON:  Multiple prior comparison most recent November 26, 2018, November 25, 2018 November 24, 2018 FINDINGS: Cardiomediastinal silhouette unchanged in size and contour. Endotracheal tube terminates 3.9 cm above the carina. Left IJ venous catheter appears to terminate at the confluence of the SVC and the left brachiocephalic vein. Enteric feeding tube projects over the mediastinum and terminates out of the field of view. Similar appearance of elevation of the right hemidiaphragm. Airspace opacity at the left lung base similar to the comparison. No pneumothorax. No large pleural effusion. More mild airspace opacity on the right. IMPRESSION: Similar appearance of the lungs, with left greater than right airspace disease persisting. Endotracheal tube terminates 3.9 cm above the carina. Unchanged left IJ central venous catheter and enteric feeding tube. Similar appearance of elevation of  the right hemidiaphragm. Electronically Signed   By: Corrie Mckusick D.O.   On: 11/27/2018 08:47   Dg Chest Port 1 View  Result Date: 11/26/2018 CLINICAL DATA:  COVID-19, intubation EXAM: PORTABLE CHEST 1 VIEW COMPARISON:  Portable exam 0903 hours compared  to 11/25/2018 FINDINGS: Tip of endotracheal tube projects 3.2 cm above carina. Feeding tube extends into abdomen with tip projecting over duodenal bulb. Numerous EKG leads project over chest. Stable heart size and mediastinal contours. Survey aorta Scattered airspace infiltrates in the mid to lower lungs consistent with pneumonia. Bones demineralized. No pneumothorax. IMPRESSION: BILATERAL pulmonary infiltrates consistent with pneumonia. Electronically Signed   By: Lavonia Dana M.D.   On: 11/26/2018 12:13   Dg Chest Port 1 View  Result Date: 11/25/2018 CLINICAL DATA:  COVID-19 pneumonia EXAM: PORTABLE CHEST 1 VIEW COMPARISON:  Multiple prior chest films. FINDINGS: The endotracheal tube is 2.7 cm above the carina. The NG tube is coursing down the esophagus and into the stomach. The left IJ central venous catheter is stable with its tip near the brachiocephalic SVC junction. The heart is mildly enlarged but stable. Stable tortuosity and calcification of the thoracic aorta. Persistent bilateral infiltrates, left greater than right. No definite pleural effusions. Stable fairly marked elevation of the right hemidiaphragm. IMPRESSION: 1. Stable support apparatus as detailed above. 2. Stable cardiac enlargement. 3. Persistent bilateral infiltrates. Electronically Signed   By: Marijo Sanes M.D.   On: 11/25/2018 09:04   Dg Chest Port 1 View  Result Date: 11/24/2018 CLINICAL DATA:  Respiratory failure EXAM: PORTABLE CHEST 1 VIEW COMPARISON:  11/23/2018, 11/22/2018, 11/21/2018, 11/19/2018 FINDINGS: Endotracheal tube tip is about 2 cm superior to the carina. Esophageal tube tip is below the diaphragm but non included. Left IJ central venous catheter tip projects over the upper mediastinum. Elevation of right diaphragm. Overall no significant change in left greater than right pulmonary infiltrates. Aortic atherosclerosis. No pneumothorax. Moderate subcutaneous emphysema over the supraclavicular fossa and the chest wall  on the right. IMPRESSION: 1. Support lines and tubes as above 2. Overall no significant change in left greater than right pulmonary infiltrate since the prior exam 3. Continued moderate subcutaneous emphysema over the chest walls and supraclavicular fossa Electronically Signed   By: Donavan Foil M.D.   On: 11/24/2018 02:48   Dg Chest Port 1 View  Result Date: 11/23/2018 CLINICAL DATA:  Change in heart rate and blood pressure. EXAM: PORTABLE CHEST 1 VIEW COMPARISON:  Chest x-rays dated 11/23/2018 and 11/22/2018. FINDINGS: Support apparatus appears stable in position. Persistent perihilar and bibasilar opacities, likely a combination of pulmonary edema and bibasilar atelectasis. No pleural effusion or pneumothorax seen. Subcutaneous emphysema again noted at the level of the upper chest and lower neck. IMPRESSION: Stable chest x-ray. Persistent perihilar and bibasilar opacities, likely a combination of pulmonary edema and bibasilar atelectasis. Persistent subcutaneous emphysema within the upper chest and lower neck. No underlying pneumothorax seen. Electronically Signed   By: Franki Cabot M.D.   On: 11/23/2018 19:14   Dg Chest Port 1 View  Result Date: 11/23/2018 CLINICAL DATA:  Subcutaneous emphysema. EXAM: PORTABLE CHEST 1 VIEW COMPARISON:  11/22/2018. FINDINGS: Endotracheal tube, left IJ line, NG tube in stable position. Heart size stable. Bilateral pulmonary infiltrates unchanged. Mild basilar atelectasis. No pleural effusion or pneumothorax. Diffuse bilateral neck and right lateral chest wall subcutaneous emphysema again noted. No interim change. IMPRESSION: 1.  Lines and tubes in stable position. 2.  Bilateral pulmonary infiltrates unchanged. 3. Bilateral neck and right lateral  chest wall subcutaneous emphysema again noted without interim change. No pneumothorax noted. Chest is unchanged from prior exam. Electronically Signed   By: Marcello Moores  Register   On: 11/23/2018 06:53   Dg Chest Port 1  View  Result Date: 11/22/2018 CLINICAL DATA:  Subcutaneous emphysema, COVID positive patient EXAM: PORTABLE CHEST 1 VIEW COMPARISON:  11/21/2018 FINDINGS: Endotracheal tube terminates 3 cm above the carina. Left IJ venous catheter terminates at the junction of the distal left brachiocephalic vein and SVC. Enteric tube courses of the stomach. Mild patchy bilateral pulmonary opacities in the right perihilar lung and left upper and lower lobes. No pleural effusion. No pneumothorax is seen. Subcutaneous emphysema in the bilateral neck and right lateral chest wall/axilla, new. IMPRESSION: New subcutaneous emphysema in the bilateral neck and right lateral chest wall/axilla. No pneumothorax is seen. Mild patchy opacities in the lungs bilaterally, left greater than right, in this patient with known COVID. Endotracheal tube terminates 3 cm above the carina. Additional support apparatus as above. Electronically Signed   By: Julian Hy M.D.   On: 11/22/2018 22:57   Dg Chest Port 1 View  Result Date: 11/21/2018 CLINICAL DATA:  Pneumonia due to COVID-19 EXAM: PORTABLE CHEST 1 VIEW COMPARISON:  Portable exam 1614 hours compared to 11/19/2018 FINDINGS: Tip of endotracheal tube projects 2.8 cm above carina. Nasogastric tube extends into stomach. LEFT jugular central venous catheter with tip projecting over SVC. Upper normal heart size. Atherosclerotic calcification aorta. Elevation of RIGHT diaphragm. Patchy airspace infiltrates bilaterally consistent with pneumonia and history of COVID-19. No pleural effusion or pneumothorax. Bones unremarkable. IMPRESSION: Persistent BILATERAL pulmonary infiltrates consistent with pneumonia. Electronically Signed   By: Lavonia Dana M.D.   On: 11/21/2018 18:41   Dg Chest Port 1 View  Result Date: 11/19/2018 CLINICAL DATA:  Endotracheal tube placement and central line placement. EXAM: PORTABLE CHEST 1 VIEW COMPARISON:  Chest radiograph performed 11/21/2018 FINDINGS: The patient's  endotracheal tube is seen ending 2 cm above the carina. An enteric tube is noted extending overlying the body of the stomach. A left IJ line is noted ending about the proximal SVC. The lungs remain hypoexpanded. Patchy bilateral perihilar and left basilar airspace opacification is concerning for pneumonia. No definite pleural effusion or pneumothorax is seen. The cardiomediastinal silhouette is borderline enlarged. No acute osseous abnormalities are seen. IMPRESSION: 1. Endotracheal tube seen ending 2 cm above the carina. 2. Enteric tube noted extending overlying the body of the stomach. 3. Left IJ line ending about the proximal SVC. 4. Lungs hypoexpanded. Patchy bilateral perihilar and left basilar airspace opacification are concerning for pneumonia. 5. Borderline cardiomegaly. Electronically Signed   By: Garald Balding M.D.   On: 11/19/2018 19:44   Dg Chest Port 1 View  Result Date: 11/22/2018 CLINICAL DATA:  COVID-19 positive, shortness of breath EXAM: PORTABLE CHEST 1 VIEW COMPARISON:  Portable exam 1935 hours compared to 11/17/2018 FINDINGS: Based on multiple prior exams, current study is mislabeled LEFT versus RIGHT and is re-labeled. Chronic elevation of RIGHT diaphragm. Normal heart size and mediastinal contours. Patchy infiltrates LEFT lung. No pleural effusion or pneumothorax. IMPRESSION: Patchy LEFT lung infiltrates. Chronic elevation of RIGHT diaphragm. Electronically Signed   By: Lavonia Dana M.D.   On: 11/14/2018 20:08   Dg Abd Portable 1v  Result Date: 11/26/2018 CLINICAL DATA:  COVID-19, constipation EXAM: PORTABLE ABDOMEN - 1 VIEW COMPARISON:  Portable exam 0903 hours compared to 11/19/2018 FINDINGS: Tip of feeding tube projects over duodenal bulb. Normal stool burden. Small amount retained  contrast in colon. No bowel dilatation or bowel wall thickening. Bones demineralized. IMPRESSION: Nonobstructive bowel gas pattern with normal stool burden. Electronically Signed   By: Lavonia Dana M.D.    On: 11/26/2018 12:13   Vas [place] Lower Extremity Venous (dvt)  Result Date: 12/02/2018  Lower Venous Study Indications: Edema, and Positive for Covid-19.  Limitations: Poor ultrasound/tissue interface and body habitus. Comparison Study: No prior studies. Performing Technologist: Oda Cogan RDMS, RVT  Examination Guidelines: A complete evaluation includes B-mode imaging, spectral Doppler, color Doppler, and power Doppler as needed of all accessible portions of each vessel. Bilateral testing is considered an integral part of a complete examination. Limited examinations for reoccurring indications may be performed as noted.  +---------+---------------+---------+-----------+----------+--------------+  RIGHT     Compressibility Phasicity Spontaneity Properties Summary         +---------+---------------+---------+-----------+----------+--------------+  CFV       Full            Yes       Yes                                    +---------+---------------+---------+-----------+----------+--------------+  SFJ       Full                                                             +---------+---------------+---------+-----------+----------+--------------+  FV Prox   Full                                                             +---------+---------------+---------+-----------+----------+--------------+  FV Mid    Full                                                             +---------+---------------+---------+-----------+----------+--------------+  FV Distal Full                                                             +---------+---------------+---------+-----------+----------+--------------+  PFV       Full                                                             +---------+---------------+---------+-----------+----------+--------------+  POP       Full            Yes       Yes                                    +---------+---------------+---------+-----------+----------+--------------+  PTV                                                         Not visualized  +---------+---------------+---------+-----------+----------+--------------+  PERO                                                       Not visualized  +---------+---------------+---------+-----------+----------+--------------+   +---------+---------------+---------+-----------+----------+--------------+  LEFT      Compressibility Phasicity Spontaneity Properties Summary         +---------+---------------+---------+-----------+----------+--------------+  CFV       Full            Yes       Yes                                    +---------+---------------+---------+-----------+----------+--------------+  SFJ       Full                                                             +---------+---------------+---------+-----------+----------+--------------+  FV Prox   Full                                                             +---------+---------------+---------+-----------+----------+--------------+  FV Mid    Full                                                             +---------+---------------+---------+-----------+----------+--------------+  FV Distal Full                                                             +---------+---------------+---------+-----------+----------+--------------+  PFV       Full                                                             +---------+---------------+---------+-----------+----------+--------------+  POP       Full                                                             +---------+---------------+---------+-----------+----------+--------------+  PTV                                                        Not visualized  +---------+---------------+---------+-----------+----------+--------------+  PERO                                                       Not visualized  +---------+---------------+---------+-----------+----------+--------------+     Summary: Right: There is no evidence of deep vein thrombosis in  the lower extremity. However, portions of this examination were limited- see technologist comments above. No cystic structure found in the popliteal fossa. Left: No cystic structure found in the popliteal fossa.  *See table(s) above for measurements and observations. Electronically signed by Curt Jews MD on 12/02/2018 at 11:10:00 AM.    Final    [place] Ekg Site Rite  Result Date: 11/30/2018 If Site Rite image not attached, placement could not be confirmed due to current cardiac rhythm.   Microbiology: Recent Results (from the past 240 hour(s))  Culture, blood (Routine X 2) w Reflex to ID Panel     Status: Abnormal   Collection Time: 12/01/18  2:14 PM   Specimen: BLOOD RIGHT HAND  Result Value Ref Range Status   Specimen Description   Final    BLOOD RIGHT HAND Performed at [facility], [place] [address] [place], [place] [address]    Special Requests   Final    BOTTLES DRAWN AEROBIC AND ANAEROBIC Blood Culture adequate volume Performed at [place] [address] [place], [place] [address]    Culture  Setup Time   Final    GRAM POSITIVE COCCI ANAEROBIC BOTTLE ONLY CRITICAL VALUE NOTED.  VALUE IS CONSISTENT WITH PREVIOUSLY REPORTED AND CALLED VALUE.    Culture (A)  Final    STAPHYLOCOCCUS AUREUS SUSCEPTIBILITIES PERFORMED ON PREVIOUS CULTURE WITHIN THE LAST 5 DAYS. Performed at [facility], [place] [address], [place], [place] [address]    Report Status 12/04/2018 FINAL  Final  Culture, blood (Routine X 2) w Reflex to ID Panel     Status: Abnormal   Collection Time: 12/01/18  2:20 PM   Specimen: BLOOD  Result Value Ref Range Status   Specimen Description   Final    BLOOD RIGHT HAND Performed at [place] [address] [place], [place] [address]    Special Requests   Final    BOTTLES DRAWN AEROBIC AND ANAEROBIC Blood Culture adequate volume Performed at [facility] [address], [place], [place]  [address]    Culture  Setup Time   Final    IN BOTH AEROBIC AND ANAEROBIC BOTTLES GRAM POSITIVE COCCI CRITICAL VALUE NOTED.  VALUE IS CONSISTENT WITH PREVIOUSLY REPORTED AND CALLED VALUE.    Culture (A)  Final    STAPHYLOCOCCUS AUREUS SUSCEPTIBILITIES PERFORMED ON PREVIOUS CULTURE WITHIN THE LAST 5 DAYS. Performed at [facility], [place] [address] [place], [place] [address]    Report Status 12/04/2018 FINAL  Final  Culture, Urine     Status: Abnormal   Collection Time: 12/03/18  1:34 PM   Specimen: Urine, Catheterized  Result Value Ref Range  Status   Specimen Description   Final    URINE, CATHETERIZED Performed at [facility], [place] [address] [place], [place] [address]    Special Requests   Final    NONE Performed at [facility], [place] [address] [place], [place] [address]    Culture (A)  Final    60,000 COLONIES/mL PROTEUS MIRABILIS 40,000 COLONIES/mL KLEBSIELLA PNEUMONIAE    Report Status 12/10/2018 FINAL  Final   Organism ID, Bacteria PROTEUS MIRABILIS (A)  Final   Organism ID, Bacteria KLEBSIELLA PNEUMONIAE (A)  Final      Susceptibility   Klebsiella pneumoniae - MIC*    AMPICILLIN >=32 RESISTANT Resistant     CEFAZOLIN 8 SENSITIVE Sensitive     CEFTRIAXONE <=1 SENSITIVE Sensitive     CIPROFLOXACIN <=0.25 SENSITIVE Sensitive     GENTAMICIN <=1 SENSITIVE Sensitive     IMIPENEM <=0.25 SENSITIVE Sensitive     NITROFURANTOIN 128 RESISTANT Resistant     TRIMETH/SULFA <=20 SENSITIVE Sensitive     AMPICILLIN/SULBACTAM >=32 RESISTANT Resistant     PIP/TAZO >=128 RESISTANT Resistant     Extended ESBL NEGATIVE Sensitive     * 40,000 COLONIES/mL KLEBSIELLA PNEUMONIAE   Proteus mirabilis - MIC*    AMPICILLIN <=2 SENSITIVE Sensitive     CEFAZOLIN <=4 SENSITIVE Sensitive     CEFTRIAXONE <=1 SENSITIVE Sensitive     CIPROFLOXACIN <=0.25 SENSITIVE Sensitive     GENTAMICIN <=1 SENSITIVE Sensitive     IMIPENEM 2 SENSITIVE Sensitive      NITROFURANTOIN 128 RESISTANT Resistant     TRIMETH/SULFA <=20 SENSITIVE Sensitive     AMPICILLIN/SULBACTAM <=2 SENSITIVE Sensitive     PIP/TAZO <=4 SENSITIVE Sensitive     * 60,000 COLONIES/mL PROTEUS MIRABILIS  Culture, blood (Routine X 2) w Reflex to ID Panel     Status: Abnormal   Collection Time: 12/03/18  4:16 PM   Specimen: BLOOD  Result Value Ref Range Status   Specimen Description   Final    BLOOD RIGHT ARM Performed at [facility] [address] [place], [place] [address]    Special Requests   Final    BOTTLES DRAWN AEROBIC AND ANAEROBIC Blood Culture adequate volume Performed at [place] [address], [place], [place] [address]    Culture  Setup Time   Final    GRAM POSITIVE COCCI ANAEROBIC BOTTLE ONLY CRITICAL VALUE NOTED.  VALUE IS CONSISTENT WITH PREVIOUSLY REPORTED AND CALLED VALUE.    Culture (A)  Final    STAPHYLOCOCCUS AUREUS SUSCEPTIBILITIES PERFORMED ON PREVIOUS CULTURE WITHIN THE LAST 5 DAYS. Performed at [facility], [place] [address] [place], [place] [address]    Report Status 12/07/2018 FINAL  Final  Culture, blood (Routine X 2) w Reflex to ID Panel     Status: Abnormal   Collection Time: 12/03/18  4:29 PM   Specimen: BLOOD  Result Value Ref Range Status   Specimen Description   Final    BLOOD RIGHT ARM Performed at [facility] [address] [place], [place] [address]    Special Requests   Final    BOTTLES DRAWN AEROBIC AND ANAEROBIC Blood Culture adequate volume Performed at [place] [facility] [address], [place], [place] [address]    Culture  Setup Time   Final    GRAM POSITIVE COCCI IN CLUSTERS ANAEROBIC BOTTLE ONLY CRITICAL VALUE NOTED.  VALUE IS CONSISTENT WITH PREVIOUSLY REPORTED AND CALLED VALUE. Performed  at [facility], [place] [address], [place], [place] [address]    Culture METHICILLIN RESISTANT STAPHYLOCOCCUS AUREUS (A)  Final   Report  Status 2018/12/13 FINAL  Final   Organism ID, Bacteria METHICILLIN RESISTANT STAPHYLOCOCCUS AUREUS  Final      Susceptibility   Methicillin resistant staphylococcus aureus - MIC*    CIPROFLOXACIN <=0.5 SENSITIVE Sensitive     ERYTHROMYCIN >=8 RESISTANT Resistant     GENTAMICIN <=0.5 SENSITIVE Sensitive     OXACILLIN RESISTANT Resistant     TETRACYCLINE <=1 SENSITIVE Sensitive     VANCOMYCIN 1 SENSITIVE Sensitive     TRIMETH/SULFA <=10 SENSITIVE Sensitive     CLINDAMYCIN <=0.25 SENSITIVE Sensitive     RIFAMPIN <=0.5 SENSITIVE Sensitive     Inducible Clindamycin NEGATIVE Sensitive     * METHICILLIN RESISTANT STAPHYLOCOCCUS AUREUS  Culture, blood (routine x 2)     Status: None (Preliminary result)   Collection Time: 12/06/18  5:45 PM   Specimen: BLOOD  Result Value Ref Range Status   Specimen Description   Final    BLOOD RIGHT ANTECUBITAL Performed at [facility] [address], [place], [place] [address]    Special Requests   Final    BOTTLES DRAWN AEROBIC ONLY Blood Culture adequate volume Performed at [facility] [address], [place], [place] [address]    Culture   Final    NO GROWTH 3 DAYS Performed at [facility], [place] [address], [place], [place] [address]    Report Status PENDING  Incomplete  Culture, blood (routine x 2)     Status: None (Preliminary result)   Collection Time: 12/06/18  5:55 PM   Specimen: BLOOD LEFT HAND  Result Value Ref Range Status   Specimen Description   Final    BLOOD LEFT HAND Performed at [facility], [place] [address], [place], [place] [address]    Special Requests   Final    BOTTLES DRAWN AEROBIC ONLY Blood Culture adequate volume Performed at [facility] [address], [place], [place] [address]    Culture   Final    NO GROWTH 3 DAYS Performed at [facility], [place] [address] [place], [place] [address]    Report Status PENDING  Incomplete  Gram stain      Status: None   Collection Time: 12/07/18  9:52 AM   Specimen: Fluid  Result Value Ref Range Status   Specimen Description   Final    FLUID Performed at [facility] [address], [place], [place] [address]    Special Requests   Final    PLATELET [id number] Performed at [facility], [place] [address], [place], [place] [address]    Gram Stain   Final    NO WBC SEEN NO ORGANISMS SEEN CYTOSPIN SMEAR Performed at [facility], [place] [address] [place], [place] [address]    Report Status 12/07/2018 FINAL  Final  Culture, body fluid-bottle     Status: None (Preliminary result)   Collection Time: 12/07/18 10:15 AM   Specimen: Fluid  Result Value Ref Range Status   Specimen Description FLUID PLATELET [id number]  Final   Special Requests BOTTLES DRAWN AEROBIC AND ANAEROBIC  Final   Culture   Final    NO GROWTH 2 DAYS Performed at [facility], [place] [address] [place],  [address]    Report Status PENDING  Incomplete  Culture,  blood (routine x 2)     Status: None (Preliminary result)   Collection Time: 12/07/18  5:30 PM   Specimen: BLOOD  Result Value Ref Range Status   Specimen Description BLOOD RIGHT ANTECUBITAL  Final   Special Requests   Final    BOTTLES DRAWN AEROBIC ONLY Blood Culture adequate volume   Culture   Final    NO GROWTH 2 DAYS Performed at [facility], [address] [place], [place] [address]    Report Status PENDING  Incomplete  Culture, blood (routine x 2)     Status: None (Preliminary result)   Collection Time: 12/07/18  5:43 PM   Specimen: BLOOD LEFT HAND  Result Value Ref Range Status   Specimen Description BLOOD LEFT HAND  Final   Special Requests   Final    BOTTLES DRAWN AEROBIC ONLY Blood Culture adequate volume   Culture   Final    NO GROWTH 2 DAYS Performed at [facility], [place] [address], [place], [place] [address]    Report Status PENDING  Incomplete  Culture,  respiratory (non-expectorated)     Status: None (Preliminary result)   Collection Time: 12/08/18 12:42 PM   Specimen: Tracheal Aspirate; Respiratory  Result Value Ref Range Status   Specimen Description   Final    TRACHEAL ASPIRATE Performed at [facility] [address] [place], [place] [address]    Special Requests   Final    Immunocompromised Performed at [facility], [place] [address], [place], [place] [address]    Gram Stain   Final    ABUNDANT WBC PRESENT, PREDOMINANTLY PMN MODERATE GRAM NEGATIVE RODS    Culture   Final    ABUNDANT PROTEUS MIRABILIS ABUNDANT KLEBSIELLA PNEUMONIAE SUSCEPTIBILITIES TO FOLLOW Performed at [facility], [place] [address] [place], [place] [address]    Report Status PENDING  Incomplete  Culture, blood (single)     Status: None (Preliminary result)   Collection Time: 12/08/18  5:43 PM   Specimen: BLOOD  Result Value Ref Range Status   Specimen Description   Final    BLOOD RIGHT ANTECUBITAL Performed at [facility], [place] [address], [place], [place] [address]    Special Requests   Final    BOTTLES DRAWN AEROBIC ONLY Performed at [place] [facility] [address] [place], [place] [address]    Culture   Final    NO GROWTH < 24 HOURS Performed at [facility] [address], [place],  [address]    Report Status PENDING  Incomplete    Labs: CBC: Recent Labs  Lab 12/05/18 0500 12/06/18 0453 12/06/18 0517 12/07/18 0500 12/08/18 0506 2018-12-29 0500  WBC 3.3* 2.2*  --  2.7* 1.9* 3.9*  NEUTROABS 2.2 1.4*  --  1.8 1.3* 2.8  HGB 8.4* 8.1* 8.5* 7.6* 7.6* 6.9*  HCT 28.7* 26.9* 25.0* 25.2* 26.1* 23.9*  MCV 98.3 97.8  --  99.2 100.8* 104.8*  PLT 55* 57*  --  69* 140* 65*   Basic Metabolic Panel: Recent Labs  Lab 12/05/18 0500 12/06/18 0453 12/06/18 0517 12/07/18 0500 12/08/18 0506 2018-12-29 0500  NA 141 141 140 141 140 142  K 4.5 3.8 3.6 3.9 3.6 4.2  CL 103  100  --  101 102 106  CO2 30 31  --  30 29 23   GLUCOSE 114* 122*  --  61* 131* 193*  BUN 40* 36*  --  39* 30* 37*  CREATININE 0.69 0.82  --  0.79 0.77 0.92  CALCIUM 7.4* 7.5*  --  7.6* 7.5* 7.5*  MG 2.0 1.8  --  2.1 1.9 1.9   Liver Function Tests: Recent Labs  Lab 12/04/18 0500 12/05/18 0500 12/06/18 0453 12/08/18 0506 Dec 25, 2018 0500  AST 80* 113* 119* 106* 82*  ALT 99* 112* 109* 125* 111*  ALKPHOS 63 66 64 92 97  BILITOT 1.4* 1.5* 1.6* 1.6* 1.9*  PROT 4.7* 4.6* 4.8* 5.7* 6.0*  ALBUMIN 2.2* 2.1* 2.4* 2.3* 2.9*   No results for input(s): LIPASE, AMYLASE in the last 168 hours. No results for input(s): AMMONIA in the last 168 hours. Cardiac Enzymes: Recent Labs  Lab 12/05/18 1400 12/07/18 0500 12/08/18 0506 12-25-2018 0500  CKTOTAL 2,215* 2,387* 2,461* 1,376*   BNP (last 3 results) Recent Labs    11/29/18 0435 11/30/18 0457 12/01/18 0535  BNP 132.0* 226.6* 276.6*   CBG: Recent Labs  Lab 12/08/18 1945 12/08/18 2343 12/25/2018 0342 12-25-18 0820 12-25-2018 1136  GLUCAP 131* 158* 158* 204* 186*   Time spent: 35 minutes  Signed:  Berle Mull  [facility] 12/10/2018

## 2018-12-29 NOTE — Progress Notes (Signed)
ANTICOAGULATION CONSULT NOTE - Follow Up Consult  Pharmacy Consult for Heparin IV Indication: atrial fibrillation  (Hx of DVT on PTA Xarelto)  Allergies  Allergen Reactions  . Sulfonamide Derivatives Itching    Patient Measurements: Height: 5\' 3"  (160 cm) Weight: 255 lb 8.2 oz (115.9 kg) IBW/kg (Calculated) : 52.4 Heparin Dosing Weight: 80.5 kg  Vital Signs: Temp: 98.6 F (37 C) (08/12 0351) Temp Source: Axillary (08/12 0351) BP: 92/54 (08/12 0615) Pulse Rate: 99 (08/12 0615)  Labs: Recent Labs    12/07/18 0500 12/08/18 0506 12/08/18 0507 12/08/18 2210 12-22-18 0500  HGB 7.6* 7.6*  --   --  6.9*  HCT 25.2* 26.1*  --   --  23.9*  PLT 69* 140*  --   --  65*  HEPARINUNFRC <0.10*  --  <0.10* 0.14* 0.23*  CREATININE 0.79 0.77  --   --   --   CKTOTAL 2,387* 2,461*  --   --   --     Estimated Creatinine Clearance: 82.7 mL/min (by C-G formula based on SCr of 0.77 mg/dL).   Medications:  Scheduled:  . sodium chloride   Intravenous Once  . sodium chloride   Intravenous Once  . amiodarone  200 mg Per Tube BID  . chlorhexidine  15 mL Mouth/Throat BID  . digoxin  0.125 mg Intravenous Daily  . etomidate  40 mg Intravenous Once  . feeding supplement (PRO-STAT SUGAR FREE 64)  30 mL Per Tube QID  . feeding supplement (VITAL AF 1.2 CAL)  1,000 mL Per Tube Q24H  . ferrous sulfate  300 mg Per Tube TID WC  . gabapentin  100 mg Per Tube Q8H  . insulin aspart  0-9 Units Subcutaneous Q4H  . insulin detemir  40 Units Subcutaneous BID  . levothyroxine  112 mcg Per Tube Q0600  . mouth rinse  15 mL Mouth Rinse 10 times per day  . midodrine  5 mg Per Tube Q8H  . multivitamin with minerals  1 tablet Per Tube Daily  . pantoprazole sodium  40 mg Per Tube BID  . polyethylene glycol  17 g Per Tube Daily  . propofol  500 mg Intravenous Once  . sodium chloride flush  10-40 mL Intracatheter Q12H  . vitamin C  500 mg Per Tube Daily  . zinc sulfate  220 mg Per Tube Daily   Infusions:  .  sodium chloride 10 mL/hr at December 22, 2018 0600  . albumin human 42 mL/hr at 12/08/18 2024  . ceFTAROline (TEFLARO) IV Stopped (December 22, 2018 0049)  . fentaNYL infusion INTRAVENOUS 200 mcg/hr (2018/12/22 0618)  . heparin    . norepinephrine (LEVOPHED) Adult infusion 10 mcg/min (2018-12-22 0600)  . phenylephrine (NEO-SYNEPHRINE) Adult infusion Stopped (12/08/18 0930)  . vasopressin (PITRESSIN) infusion - *FOR SHOCK* 0.03 Units/min (2018-12-22 0600)    Assessment: 6 yoF admitted on 7/22 with PMH of afib and DVT on chronic Xarelto. She was transitioned to Heparin IV for possible chest tube (not needed), and resume Xarelto on 7/29.  She then had black stools on 7/30 and Xarelto was d/c (last dose on 7/29 at 1400). Heparin was started on 8/1, however it was placed on hold on 8/5 due to blood seen from pure wick with anemia and thrombocytopenia (in the setting of MRSA bacteremia.).  Pt has low 4T score (2-3) with time course not generally consistent with HIT. Low plts may be due to Sepsis induced DIC.   Heparin was held on 8/9 at 00:00 prior to the scheduled  trach.  Pharmacy is now consulted to resume Heparin IV post trach - no procedural complications.  D-dimer has increased to >20 HL was previously subtherapeutic at 0.21 on heparin at 1100 units/hr.  Increased to 1250 units/hr but was d/c before level was rechecked.   CBC:  Hgb remains low/stable at 7.6.  Plt improved to 140 s/p Plt transfusion x2 units on 8/10. No bleeding or complications reported. SCr 0.77  PM update: HL is 0.23, subtherapeutic Hgb 6.9, plt 65 both low No line or bleeding issues seen per RN    Goal of Therapy:  Heparin level 0.3-0.5 units/ml Monitor platelets by anticoagulation protocol: Yes   Plan:  No heparin bolus d/t bleeding, anemia, thrombocytopenia, recent trach. Increase  heparin IV infusion to 1400 units/hr (low initial rate) Will follow up Hgb, plt as well Heparin level 6 hours after rate change  Daily heparin level and  CBC Continue to monitor H&H and platelets    Royetta Asal, PharmD, BCPS 12-29-2018 6:40 AM

## 2018-12-29 NOTE — Progress Notes (Signed)
Hemoglobin 6.9. results paged to [facility] MD. Waiting for response.

## 2018-12-29 NOTE — Progress Notes (Signed)
PT Cancellation Note  Patient Details Name: [name] [name] MRN: [id number] DOB: [birth year]   Cancelled Treatment:    Reason Eval/Treat Not Completed: Medical issues which prohibited therapy, noted recent events, hypoxic and tachypneic. Check back another day.   Claretha Cooper December 31, 2018, [place]  Office [phone]

## 2018-12-29 NOTE — Progress Notes (Signed)
Nutrition Follow-up RD working remotely.  DOCUMENTATION CODES:   Morbid obesity  INTERVENTION:    Change TF to Vital High Protein at 65 ml/h via Cortrak tube.   D/C Pro-stat.   Provides 1560 kcal, 137 gm protein, 1304 ml free water daily.  NUTRITION DIAGNOSIS:   Inadequate oral intake related to inability to eat as evidenced by NPO status.  Ongoing   GOAL:   Provide needs based on ASPEN/SCCM guidelines  Met with TF  MONITOR:   Vent status, Labs, Skin, TF tolerance  ASSESSMENT:   68 yo female admitted with severe respiratory failure r/t COVID-19 PNA. PMH includes DM-2, obesity, asthma, CKD stage 3, chronic A fib, stroke, DVT.  S/P tracheostomy 8/10. No longer proning. Remains on pressors, changed to levophed and vasopressin.  Zofran added 8/11 for nausea. Cortrak in place, Vital AF 1.2 at 40 ml/h with Pro-stat 30 ml QID is providing 1552 kcal, 132 gm protein, 779 ml free water daily. S/P platelet transfusion on 8/10.   Patient remains on ventilator support via trach. MV: 16 L/min Temp (24hrs), Avg:99.5 F (37.5 C), Min:98.6 F (37 C), Max:100.4 F (38 C)   Labs reviewed. Hgb 6.9 (L) CBG's: 158-204  Medications reviewed and include ferrous sulfate, novolog, levemir, MVI, vitamin C, zinc sulfate, levophed, vasopressin.  Weight up 6.5 kg overall since admission I/O Net + 18 L  Diet Order:   Diet Order            Diet NPO time specified  Diet effective midnight              EDUCATION NEEDS:   Not appropriate for education at this time  Skin:  Skin Assessment: Skin Integrity Issues: Skin Integrity Issues:: Stage II Stage II: sacrum and R side of face  Last BM:  8/12 type 7  Height:   Ht Readings from Last 1 Encounters:  12/01/18 '5\' 3"'$  (1.6 m)    Weight:   Wt Readings from Last 1 Encounters:  12/08/18 115.9 kg    Ideal Body Weight:  52.3 kg  BMI:  Body mass index is 45.26 kg/m.  Estimated Nutritional Needs:   Kcal:   4709-2957  Protein:  110-130 gm  Fluid:  >/= 1.6 L    Molli Barrows, RD, LDN, [place] Pager [phone] After Hours Pager [phone]

## 2018-12-29 NOTE — Progress Notes (Signed)
RT and MD walked into pt room and pt HR began to brady. Primary RN was called for while another RN was at bedside. Pt expired at 1410. Pt was removed from the ventilator, but trach remained in place.

## 2018-12-29 DEATH — deceased

## 2019-01-06 ENCOUNTER — Ambulatory Visit: Payer: Medicare Other | Admitting: *Deleted

## 2021-02-12 IMAGING — DX PORTABLE CHEST - 1 VIEW
2 series · 2 of 2 positions shown · non-contrast
Comparison: Portable exam 0807 hours compared to 11/17/2018

CLINICAL DATA: [id number] positive, shortness of breath

EXAM:
PORTABLE CHEST 1 VIEW

[chest ap (1 of 2)]
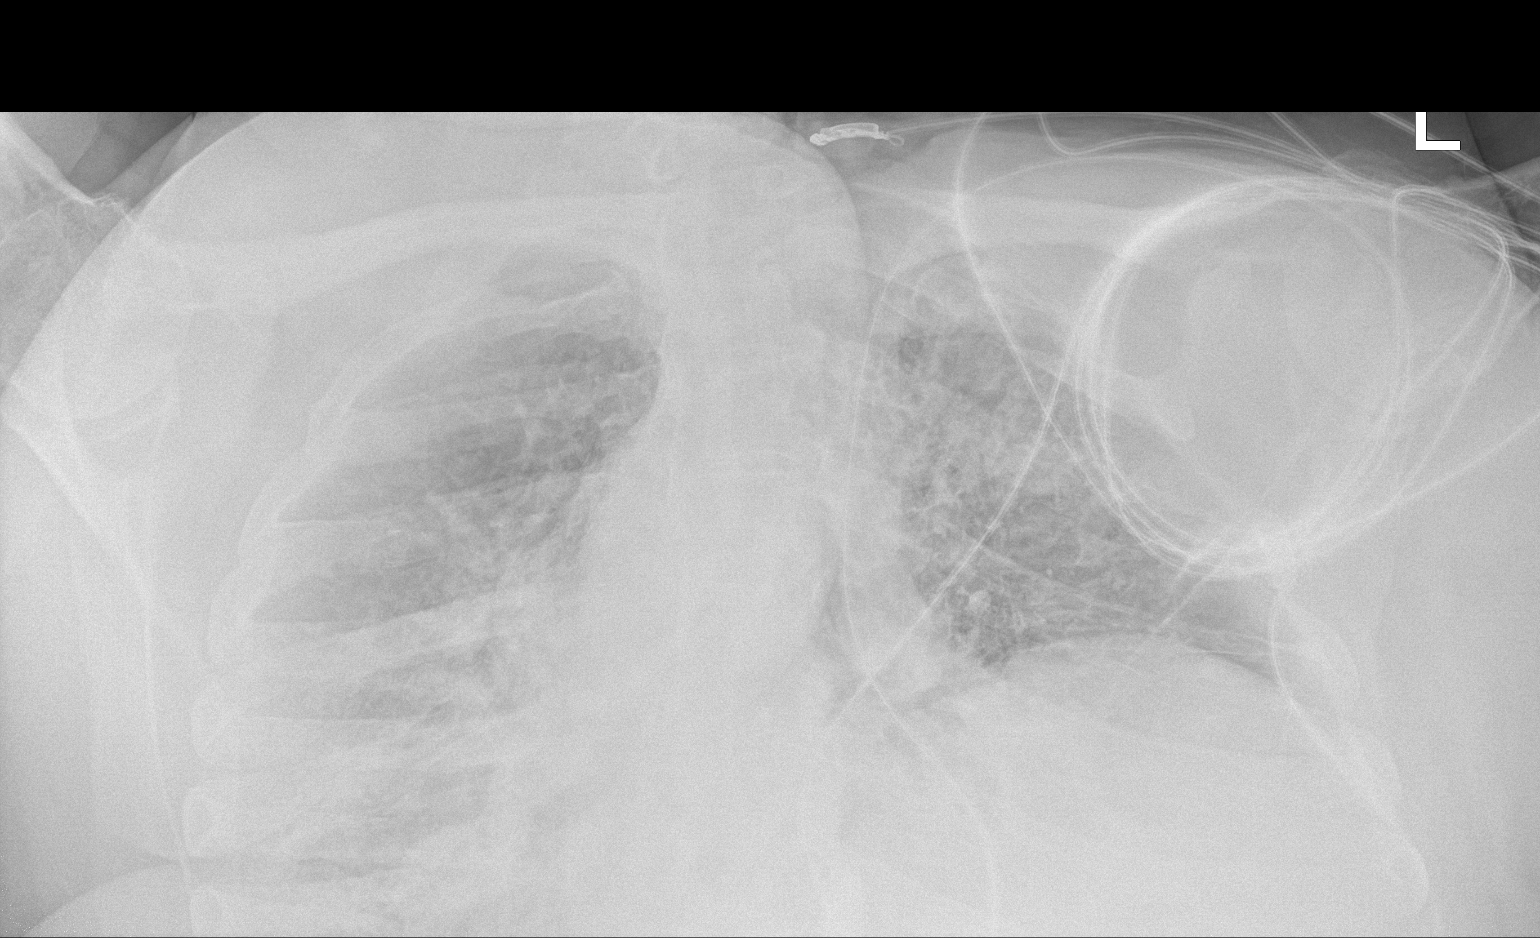

[chest ap (2 of 2)]
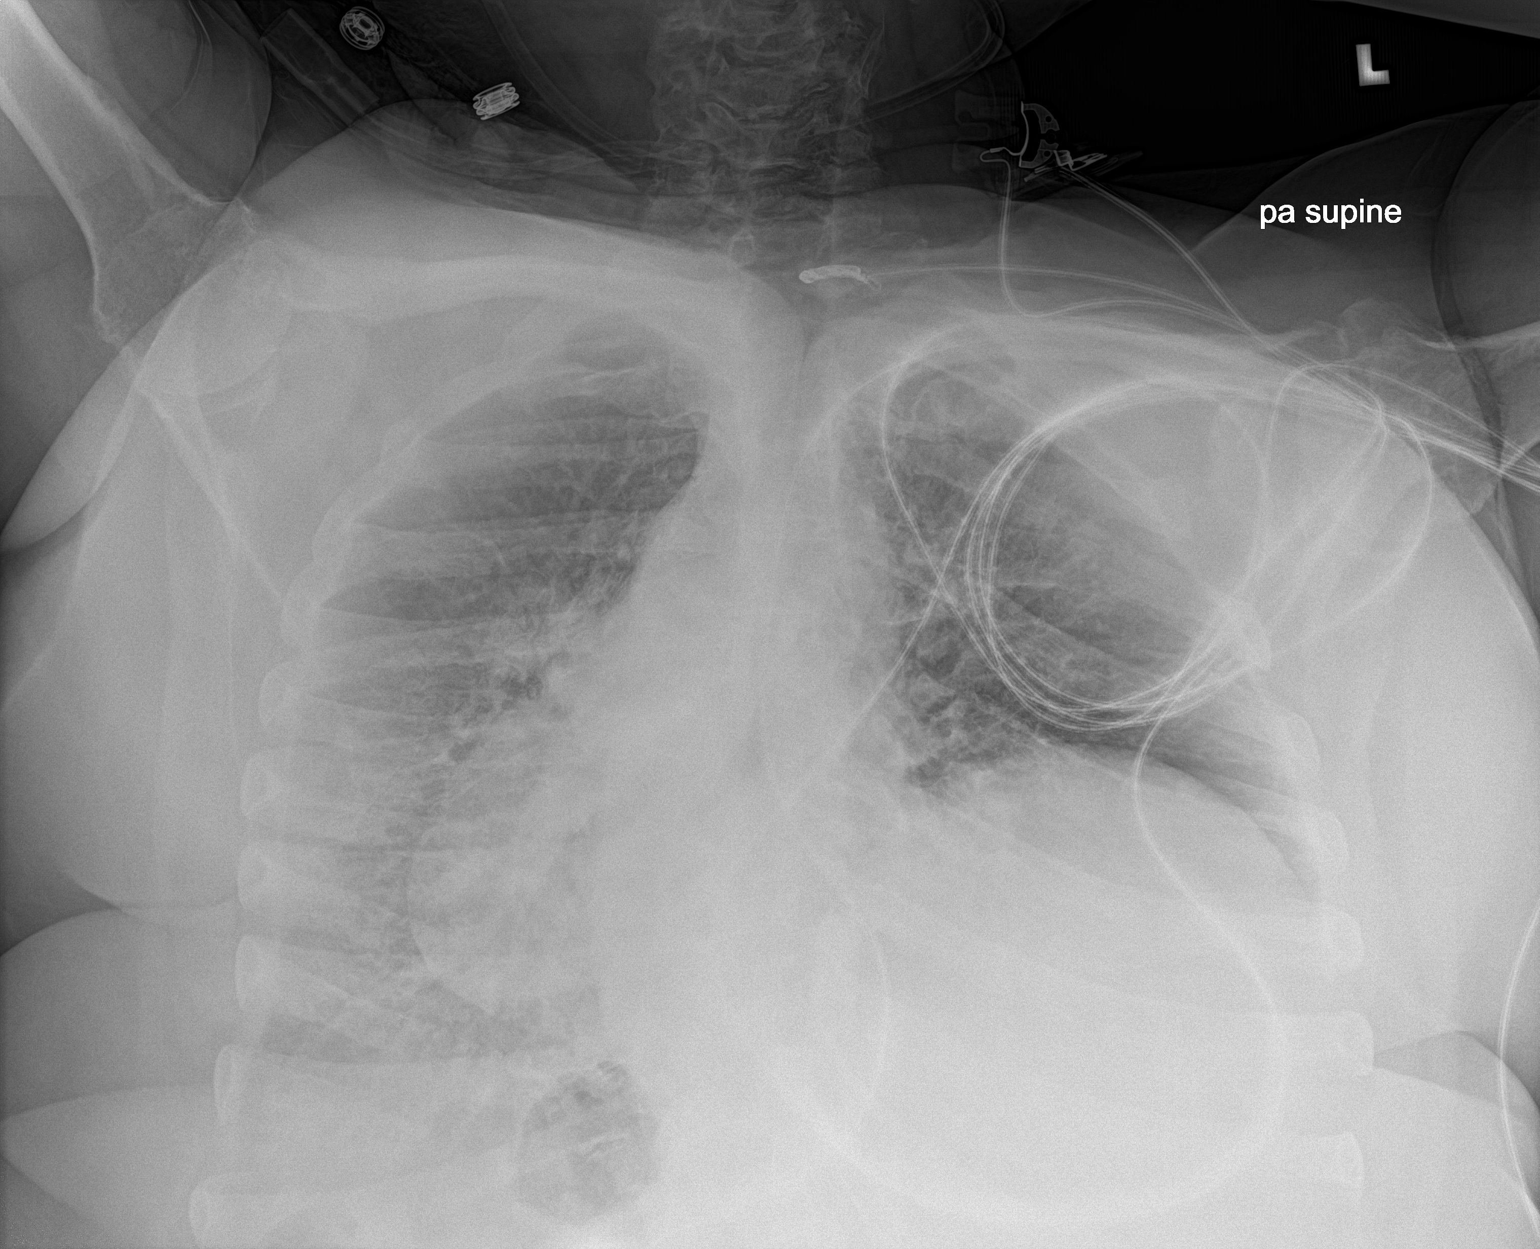

[2 of 2 positions shown; findings below may reference images not displayed]

FINDINGS: Based on multiple prior exams, current study is mislabeled LEFT
versus RIGHT and is re-labeled.

Chronic elevation of RIGHT diaphragm.

Normal heart size and mediastinal contours.

Patchy infiltrates LEFT lung.

No pleural effusion or pneumothorax.
IMPRESSION: Patchy LEFT lung infiltrates.

Chronic elevation of RIGHT diaphragm.
